# Patient Record
Sex: Female | Born: 1970 | Race: Black or African American | Hispanic: No | Marital: Married | Smoking: Never smoker
Health system: Southern US, Community
[De-identification: ages and names within clinical notes are randomized; demographics above are authoritative.]

## PROBLEM LIST (undated history)

## (undated) ENCOUNTER — Inpatient Hospital Stay (HOSPITAL_COMMUNITY): Payer: Self-pay

## (undated) DIAGNOSIS — L259 Unspecified contact dermatitis, unspecified cause: Secondary | ICD-10-CM

## (undated) DIAGNOSIS — N949 Unspecified condition associated with female genital organs and menstrual cycle: Secondary | ICD-10-CM

## (undated) DIAGNOSIS — O211 Hyperemesis gravidarum with metabolic disturbance: Secondary | ICD-10-CM

## (undated) DIAGNOSIS — E119 Type 2 diabetes mellitus without complications: Secondary | ICD-10-CM

## (undated) DIAGNOSIS — D509 Iron deficiency anemia, unspecified: Secondary | ICD-10-CM

## (undated) DIAGNOSIS — G43909 Migraine, unspecified, not intractable, without status migrainosus: Secondary | ICD-10-CM

## (undated) DIAGNOSIS — Z8742 Personal history of other diseases of the female genital tract: Secondary | ICD-10-CM

## (undated) DIAGNOSIS — O24419 Gestational diabetes mellitus in pregnancy, unspecified control: Secondary | ICD-10-CM

## (undated) DIAGNOSIS — N90810 Female genital mutilation status, unspecified: Secondary | ICD-10-CM

## (undated) DIAGNOSIS — R197 Diarrhea, unspecified: Secondary | ICD-10-CM

## (undated) DIAGNOSIS — K219 Gastro-esophageal reflux disease without esophagitis: Secondary | ICD-10-CM

## (undated) DIAGNOSIS — T839XXA Unspecified complication of genitourinary prosthetic device, implant and graft, initial encounter: Secondary | ICD-10-CM

## (undated) DIAGNOSIS — O09529 Supervision of elderly multigravida, unspecified trimester: Secondary | ICD-10-CM

## (undated) HISTORY — DX: Migraine, unspecified, not intractable, without status migrainosus: G43.909

## (undated) HISTORY — DX: Female genital mutilation status, unspecified: N90.810

## (undated) HISTORY — DX: Unspecified complication of genitourinary prosthetic device, implant and graft, initial encounter: T83.9XXA

## (undated) HISTORY — DX: Supervision of elderly multigravida, unspecified trimester: O09.529

## (undated) HISTORY — DX: Gestational diabetes mellitus in pregnancy, unspecified control: O24.419

## (undated) HISTORY — DX: Hyperemesis gravidarum with metabolic disturbance: O21.1

## (undated) HISTORY — DX: Unspecified condition associated with female genital organs and menstrual cycle: N94.9

## (undated) HISTORY — DX: Personal history of other diseases of the female genital tract: Z87.42

## (undated) HISTORY — DX: Unspecified contact dermatitis, unspecified cause: L25.9

## (undated) HISTORY — DX: Iron deficiency anemia, unspecified: D50.9

## (undated) HISTORY — DX: Gastro-esophageal reflux disease without esophagitis: K21.9

## (undated) HISTORY — DX: Type 2 diabetes mellitus without complications: E11.9

---
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
## 1999-04-19 ENCOUNTER — Other Ambulatory Visit: Admission: RE | Admit: 1999-04-19 | Discharge: 1999-04-19 | Payer: Self-pay | Admitting: Obstetrics

## 1999-11-21 ENCOUNTER — Inpatient Hospital Stay (HOSPITAL_COMMUNITY): Admission: AD | Admit: 1999-11-21 | Discharge: 1999-11-21 | Payer: Self-pay | Admitting: Internal Medicine

## 1999-11-22 ENCOUNTER — Inpatient Hospital Stay (HOSPITAL_COMMUNITY): Admission: AD | Admit: 1999-11-22 | Discharge: 1999-11-23 | Payer: Self-pay | Admitting: Obstetrics & Gynecology

## 1999-12-31 ENCOUNTER — Encounter: Admission: RE | Admit: 1999-12-31 | Discharge: 1999-12-31 | Payer: Self-pay | Admitting: Family Medicine

## 2000-01-24 ENCOUNTER — Encounter: Admission: RE | Admit: 2000-01-24 | Discharge: 2000-01-24 | Payer: Self-pay | Admitting: Family Medicine

## 2000-02-27 ENCOUNTER — Encounter: Admission: RE | Admit: 2000-02-27 | Discharge: 2000-02-27 | Payer: Self-pay | Admitting: Family Medicine

## 2001-03-03 ENCOUNTER — Encounter: Admission: RE | Admit: 2001-03-03 | Discharge: 2001-03-03 | Payer: Self-pay | Admitting: Family Medicine

## 2001-03-12 ENCOUNTER — Encounter: Admission: RE | Admit: 2001-03-12 | Discharge: 2001-03-12 | Payer: Self-pay | Admitting: Family Medicine

## 2001-03-19 ENCOUNTER — Encounter: Payer: Self-pay | Admitting: Family Medicine

## 2001-03-19 ENCOUNTER — Encounter: Admission: RE | Admit: 2001-03-19 | Discharge: 2001-03-19 | Payer: Self-pay | Admitting: Family Medicine

## 2001-04-13 ENCOUNTER — Encounter: Admission: RE | Admit: 2001-04-13 | Discharge: 2001-04-13 | Payer: Self-pay | Admitting: Family Medicine

## 2001-04-20 ENCOUNTER — Encounter: Admission: RE | Admit: 2001-04-20 | Discharge: 2001-04-20 | Payer: Self-pay | Admitting: Family Medicine

## 2001-10-18 ENCOUNTER — Emergency Department (HOSPITAL_COMMUNITY): Admission: EM | Admit: 2001-10-18 | Discharge: 2001-10-18 | Payer: Self-pay

## 2001-11-18 ENCOUNTER — Encounter: Admission: RE | Admit: 2001-11-18 | Discharge: 2001-11-18 | Payer: Self-pay | Admitting: Family Medicine

## 2001-12-21 ENCOUNTER — Encounter: Admission: RE | Admit: 2001-12-21 | Discharge: 2001-12-21 | Payer: Self-pay | Admitting: Sports Medicine

## 2001-12-24 ENCOUNTER — Encounter: Admission: RE | Admit: 2001-12-24 | Discharge: 2001-12-24 | Payer: Self-pay | Admitting: Family Medicine

## 2002-07-28 ENCOUNTER — Encounter: Admission: RE | Admit: 2002-07-28 | Discharge: 2002-07-28 | Payer: Self-pay | Admitting: Family Medicine

## 2002-10-08 ENCOUNTER — Encounter: Admission: RE | Admit: 2002-10-08 | Discharge: 2002-10-08 | Payer: Self-pay | Admitting: Family Medicine

## 2002-10-28 ENCOUNTER — Encounter: Admission: RE | Admit: 2002-10-28 | Discharge: 2002-10-28 | Payer: Self-pay | Admitting: Family Medicine

## 2002-12-18 ENCOUNTER — Emergency Department (HOSPITAL_COMMUNITY): Admission: EM | Admit: 2002-12-18 | Discharge: 2002-12-18 | Payer: Self-pay | Admitting: Emergency Medicine

## 2002-12-20 ENCOUNTER — Encounter: Admission: RE | Admit: 2002-12-20 | Discharge: 2002-12-20 | Payer: Self-pay | Admitting: Family Medicine

## 2002-12-27 ENCOUNTER — Encounter: Admission: RE | Admit: 2002-12-27 | Discharge: 2002-12-27 | Payer: Self-pay | Admitting: Family Medicine

## 2002-12-28 ENCOUNTER — Encounter: Payer: Self-pay | Admitting: Family Medicine

## 2002-12-28 ENCOUNTER — Encounter: Admission: RE | Admit: 2002-12-28 | Discharge: 2002-12-28 | Payer: Self-pay | Admitting: Family Medicine

## 2003-09-01 ENCOUNTER — Encounter: Admission: RE | Admit: 2003-09-01 | Discharge: 2003-09-01 | Payer: Self-pay | Admitting: Family Medicine

## 2003-09-15 ENCOUNTER — Encounter: Admission: RE | Admit: 2003-09-15 | Discharge: 2003-09-15 | Payer: Self-pay | Admitting: Sports Medicine

## 2003-11-04 ENCOUNTER — Encounter: Admission: RE | Admit: 2003-11-04 | Discharge: 2003-11-04 | Payer: Self-pay | Admitting: Family Medicine

## 2003-11-24 ENCOUNTER — Encounter: Admission: RE | Admit: 2003-11-24 | Discharge: 2003-11-24 | Payer: Self-pay | Admitting: Family Medicine

## 2003-12-21 ENCOUNTER — Encounter: Admission: RE | Admit: 2003-12-21 | Discharge: 2003-12-21 | Payer: Self-pay | Admitting: Family Medicine

## 2004-04-03 ENCOUNTER — Encounter: Admission: RE | Admit: 2004-04-03 | Discharge: 2004-04-03 | Payer: Self-pay | Admitting: Family Medicine

## 2004-04-04 ENCOUNTER — Ambulatory Visit (HOSPITAL_COMMUNITY): Admission: RE | Admit: 2004-04-04 | Discharge: 2004-04-04 | Payer: Self-pay | Admitting: Family Medicine

## 2004-04-09 ENCOUNTER — Encounter: Admission: RE | Admit: 2004-04-09 | Discharge: 2004-04-09 | Payer: Self-pay | Admitting: Family Medicine

## 2004-04-16 ENCOUNTER — Encounter: Admission: RE | Admit: 2004-04-16 | Discharge: 2004-04-16 | Payer: Self-pay | Admitting: Family Medicine

## 2004-05-03 ENCOUNTER — Encounter: Admission: RE | Admit: 2004-05-03 | Discharge: 2004-05-03 | Payer: Self-pay | Admitting: Sports Medicine

## 2004-05-17 ENCOUNTER — Encounter: Admission: RE | Admit: 2004-05-17 | Discharge: 2004-05-17 | Payer: Self-pay | Admitting: Family Medicine

## 2004-05-24 ENCOUNTER — Encounter: Admission: RE | Admit: 2004-05-24 | Discharge: 2004-05-24 | Payer: Self-pay | Admitting: Family Medicine

## 2004-05-24 ENCOUNTER — Ambulatory Visit (HOSPITAL_COMMUNITY): Admission: RE | Admit: 2004-05-24 | Discharge: 2004-05-24 | Payer: Self-pay | Admitting: *Deleted

## 2004-05-25 ENCOUNTER — Encounter: Admission: RE | Admit: 2004-05-25 | Discharge: 2004-05-25 | Payer: Self-pay | Admitting: Family Medicine

## 2004-05-28 ENCOUNTER — Encounter: Admission: RE | Admit: 2004-05-28 | Discharge: 2004-05-28 | Payer: Self-pay | Admitting: *Deleted

## 2004-05-31 ENCOUNTER — Encounter: Admission: RE | Admit: 2004-05-31 | Discharge: 2004-05-31 | Payer: Self-pay | Admitting: *Deleted

## 2004-06-04 ENCOUNTER — Encounter: Admission: RE | Admit: 2004-06-04 | Discharge: 2004-06-04 | Payer: Self-pay | Admitting: *Deleted

## 2004-06-07 ENCOUNTER — Encounter: Admission: RE | Admit: 2004-06-07 | Discharge: 2004-06-07 | Payer: Self-pay | Admitting: *Deleted

## 2004-06-11 ENCOUNTER — Encounter: Admission: RE | Admit: 2004-06-11 | Discharge: 2004-06-11 | Payer: Self-pay | Admitting: *Deleted

## 2004-06-14 ENCOUNTER — Ambulatory Visit (HOSPITAL_COMMUNITY): Admission: RE | Admit: 2004-06-14 | Discharge: 2004-06-14 | Payer: Self-pay | Admitting: Family Medicine

## 2004-06-14 ENCOUNTER — Encounter: Admission: RE | Admit: 2004-06-14 | Discharge: 2004-06-14 | Payer: Self-pay | Admitting: Family Medicine

## 2004-06-19 ENCOUNTER — Encounter: Admission: RE | Admit: 2004-06-19 | Discharge: 2004-06-19 | Payer: Self-pay | Admitting: *Deleted

## 2004-06-21 ENCOUNTER — Inpatient Hospital Stay (HOSPITAL_COMMUNITY): Admission: RE | Admit: 2004-06-21 | Discharge: 2004-06-24 | Payer: Self-pay | Admitting: Obstetrics & Gynecology

## 2004-08-01 ENCOUNTER — Encounter: Admission: RE | Admit: 2004-08-01 | Discharge: 2004-08-01 | Payer: Self-pay | Admitting: Sports Medicine

## 2004-08-16 ENCOUNTER — Encounter (INDEPENDENT_AMBULATORY_CARE_PROVIDER_SITE_OTHER): Payer: Self-pay | Admitting: *Deleted

## 2004-09-13 ENCOUNTER — Other Ambulatory Visit: Admission: RE | Admit: 2004-09-13 | Discharge: 2004-09-13 | Payer: Self-pay | Admitting: Family Medicine

## 2004-09-13 ENCOUNTER — Ambulatory Visit: Payer: Self-pay | Admitting: Family Medicine

## 2005-11-21 ENCOUNTER — Ambulatory Visit: Payer: Self-pay | Admitting: Family Medicine

## 2006-03-18 ENCOUNTER — Ambulatory Visit: Payer: Self-pay | Admitting: Family Medicine

## 2006-03-21 ENCOUNTER — Encounter: Admission: RE | Admit: 2006-03-21 | Discharge: 2006-04-10 | Payer: Self-pay | Admitting: Sports Medicine

## 2006-05-09 ENCOUNTER — Ambulatory Visit: Payer: Self-pay | Admitting: Family Medicine

## 2006-05-14 ENCOUNTER — Ambulatory Visit: Payer: Self-pay | Admitting: Family Medicine

## 2006-06-09 ENCOUNTER — Ambulatory Visit: Payer: Self-pay | Admitting: Sports Medicine

## 2007-02-12 DIAGNOSIS — L708 Other acne: Secondary | ICD-10-CM | POA: Insufficient documentation

## 2007-02-12 DIAGNOSIS — K219 Gastro-esophageal reflux disease without esophagitis: Secondary | ICD-10-CM | POA: Insufficient documentation

## 2007-02-12 DIAGNOSIS — K649 Unspecified hemorrhoids: Secondary | ICD-10-CM | POA: Insufficient documentation

## 2007-02-12 DIAGNOSIS — D509 Iron deficiency anemia, unspecified: Secondary | ICD-10-CM | POA: Insufficient documentation

## 2007-02-12 HISTORY — DX: Iron deficiency anemia, unspecified: D50.9

## 2007-02-13 ENCOUNTER — Encounter (INDEPENDENT_AMBULATORY_CARE_PROVIDER_SITE_OTHER): Payer: Self-pay | Admitting: *Deleted

## 2007-04-29 ENCOUNTER — Ambulatory Visit: Payer: Self-pay | Admitting: Family Medicine

## 2007-05-01 ENCOUNTER — Ambulatory Visit: Payer: Self-pay | Admitting: Family Medicine

## 2007-06-15 ENCOUNTER — Telehealth: Payer: Self-pay | Admitting: *Deleted

## 2007-06-16 ENCOUNTER — Ambulatory Visit: Payer: Self-pay | Admitting: Family Medicine

## 2007-06-16 DIAGNOSIS — H1045 Other chronic allergic conjunctivitis: Secondary | ICD-10-CM | POA: Insufficient documentation

## 2007-11-11 ENCOUNTER — Encounter: Payer: Self-pay | Admitting: Family Medicine

## 2007-11-11 ENCOUNTER — Ambulatory Visit: Payer: Self-pay | Admitting: Family Medicine

## 2007-11-11 DIAGNOSIS — R5381 Other malaise: Secondary | ICD-10-CM | POA: Insufficient documentation

## 2007-11-11 DIAGNOSIS — R5383 Other fatigue: Secondary | ICD-10-CM

## 2007-11-11 DIAGNOSIS — J029 Acute pharyngitis, unspecified: Secondary | ICD-10-CM | POA: Insufficient documentation

## 2007-11-11 LAB — CONVERTED CEMR LAB
AST: 11 units/L (ref 0–37)
Albumin: 4.4 g/dL (ref 3.5–5.2)
Alkaline Phosphatase: 56 units/L (ref 39–117)
BUN: 8 mg/dL (ref 6–23)
CO2: 24 meq/L (ref 19–32)
Calcium: 9.4 mg/dL (ref 8.4–10.5)
Chloride: 106 meq/L (ref 96–112)
Creatinine, Ser: 0.44 mg/dL (ref 0.40–1.20)
MCHC: 29.2 g/dL — ABNORMAL LOW (ref 30.0–36.0)
MCV: 70.4 fL — ABNORMAL LOW (ref 78.0–100.0)
Neutro Abs: 3.3 10*3/uL (ref 1.7–7.7)
Neutrophils Relative %: 60 % (ref 43–77)
Platelets: 256 10*3/uL (ref 150–400)
Rapid Strep: NEGATIVE
Sodium: 140 meq/L (ref 135–145)
TSH: 2.44 microintl units/mL (ref 0.350–5.50)
Total Bilirubin: 0.3 mg/dL (ref 0.3–1.2)
Total Protein: 7.4 g/dL (ref 6.0–8.3)
WBC: 5.6 10*3/uL (ref 4.0–10.5)

## 2007-12-17 HISTORY — PX: DILATION AND CURETTAGE OF UTERUS: SHX78

## 2008-02-29 ENCOUNTER — Telehealth: Payer: Self-pay | Admitting: *Deleted

## 2008-03-01 ENCOUNTER — Ambulatory Visit: Payer: Self-pay | Admitting: Family Medicine

## 2008-03-01 ENCOUNTER — Encounter: Payer: Self-pay | Admitting: Family Medicine

## 2008-03-02 LAB — CONVERTED CEMR LAB
Basophils Absolute: 0 10*3/uL (ref 0.0–0.1)
Eosinophils Relative: 1 % (ref 0–5)
Lymphs Abs: 1.9 10*3/uL (ref 0.7–4.0)
Monocytes Absolute: 0.4 10*3/uL (ref 0.1–1.0)
Neutrophils Relative %: 52 % (ref 43–77)
RBC: 5.19 M/uL — ABNORMAL HIGH (ref 3.87–5.11)
WBC: 4.9 10*3/uL (ref 4.0–10.5)

## 2008-04-06 ENCOUNTER — Ambulatory Visit: Payer: Self-pay | Admitting: Family Medicine

## 2008-04-06 DIAGNOSIS — L259 Unspecified contact dermatitis, unspecified cause: Secondary | ICD-10-CM | POA: Insufficient documentation

## 2008-04-06 LAB — CONVERTED CEMR LAB: Beta hcg, urine, semiquantitative: POSITIVE

## 2008-04-25 ENCOUNTER — Ambulatory Visit: Payer: Self-pay | Admitting: Family Medicine

## 2008-04-25 ENCOUNTER — Encounter: Payer: Self-pay | Admitting: Family Medicine

## 2008-04-25 LAB — CONVERTED CEMR LAB
Antibody Screen: NEGATIVE
Basophils Absolute: 0 10*3/uL (ref 0.0–0.1)
Eosinophils Absolute: 0.1 10*3/uL (ref 0.0–0.7)
Eosinophils Relative: 2 % (ref 0–5)
HCT: 36.8 % (ref 36.0–46.0)
Hemoglobin: 11.3 g/dL — ABNORMAL LOW (ref 12.0–15.0)
Hepatitis B Surface Ag: NEGATIVE
Lymphs Abs: 1.6 10*3/uL (ref 0.7–4.0)
MCHC: 30.7 g/dL (ref 30.0–36.0)
MCV: 72.3 fL — ABNORMAL LOW (ref 78.0–100.0)
Rh Type: POSITIVE
Rubella: 139.4 intl units/mL — ABNORMAL HIGH
Sickle Cell Screen: NEGATIVE
WBC: 5.1 10*3/uL (ref 4.0–10.5)

## 2008-04-26 ENCOUNTER — Telehealth: Payer: Self-pay | Admitting: Family Medicine

## 2008-04-29 ENCOUNTER — Ambulatory Visit: Payer: Self-pay | Admitting: Family Medicine

## 2008-04-29 DIAGNOSIS — O211 Hyperemesis gravidarum with metabolic disturbance: Secondary | ICD-10-CM | POA: Insufficient documentation

## 2008-04-29 HISTORY — DX: Hyperemesis gravidarum with metabolic disturbance: O21.1

## 2008-05-02 ENCOUNTER — Encounter: Payer: Self-pay | Admitting: Family Medicine

## 2008-05-02 ENCOUNTER — Ambulatory Visit: Payer: Self-pay | Admitting: Family Medicine

## 2008-05-18 ENCOUNTER — Encounter: Payer: Self-pay | Admitting: Family Medicine

## 2008-05-18 ENCOUNTER — Ambulatory Visit: Payer: Self-pay | Admitting: Family Medicine

## 2008-05-18 DIAGNOSIS — O09529 Supervision of elderly multigravida, unspecified trimester: Secondary | ICD-10-CM | POA: Insufficient documentation

## 2008-05-18 DIAGNOSIS — Z8742 Personal history of other diseases of the female genital tract: Secondary | ICD-10-CM | POA: Insufficient documentation

## 2008-05-18 HISTORY — DX: Supervision of elderly multigravida, unspecified trimester: O09.529

## 2008-05-18 HISTORY — DX: Personal history of other diseases of the female genital tract: Z87.42

## 2008-05-18 LAB — CONVERTED CEMR LAB: GC Probe Amp, Genital: NEGATIVE

## 2008-05-19 ENCOUNTER — Ambulatory Visit (HOSPITAL_COMMUNITY): Admission: RE | Admit: 2008-05-19 | Discharge: 2008-05-19 | Payer: Self-pay | Admitting: Family Medicine

## 2008-05-20 ENCOUNTER — Telehealth: Payer: Self-pay | Admitting: *Deleted

## 2008-05-23 ENCOUNTER — Encounter: Payer: Self-pay | Admitting: Family Medicine

## 2008-05-23 ENCOUNTER — Ambulatory Visit: Payer: Self-pay | Admitting: Family Medicine

## 2008-05-23 ENCOUNTER — Ambulatory Visit (HOSPITAL_COMMUNITY): Admission: RE | Admit: 2008-05-23 | Discharge: 2008-05-23 | Payer: Self-pay | Admitting: Obstetrics and Gynecology

## 2008-05-25 ENCOUNTER — Encounter (INDEPENDENT_AMBULATORY_CARE_PROVIDER_SITE_OTHER): Payer: Self-pay | Admitting: Family Medicine

## 2008-06-02 ENCOUNTER — Telehealth (INDEPENDENT_AMBULATORY_CARE_PROVIDER_SITE_OTHER): Payer: Self-pay | Admitting: *Deleted

## 2008-06-24 ENCOUNTER — Emergency Department (HOSPITAL_COMMUNITY): Admission: EM | Admit: 2008-06-24 | Discharge: 2008-06-24 | Payer: Self-pay | Admitting: Emergency Medicine

## 2008-07-07 ENCOUNTER — Telehealth: Payer: Self-pay | Admitting: Family Medicine

## 2008-07-08 ENCOUNTER — Encounter: Payer: Self-pay | Admitting: *Deleted

## 2008-07-20 ENCOUNTER — Encounter: Payer: Self-pay | Admitting: *Deleted

## 2008-12-30 ENCOUNTER — Ambulatory Visit: Payer: Self-pay | Admitting: Family Medicine

## 2008-12-30 DIAGNOSIS — J1089 Influenza due to other identified influenza virus with other manifestations: Secondary | ICD-10-CM | POA: Insufficient documentation

## 2009-03-03 ENCOUNTER — Inpatient Hospital Stay (HOSPITAL_COMMUNITY): Admission: AD | Admit: 2009-03-03 | Discharge: 2009-03-03 | Payer: Self-pay | Admitting: Obstetrics and Gynecology

## 2009-04-03 ENCOUNTER — Inpatient Hospital Stay (HOSPITAL_COMMUNITY): Admission: AD | Admit: 2009-04-03 | Discharge: 2009-04-04 | Payer: Self-pay | Admitting: Obstetrics & Gynecology

## 2009-05-24 ENCOUNTER — Inpatient Hospital Stay (HOSPITAL_COMMUNITY): Admission: RE | Admit: 2009-05-24 | Discharge: 2009-05-27 | Payer: Self-pay | Admitting: Obstetrics and Gynecology

## 2009-08-27 ENCOUNTER — Emergency Department (HOSPITAL_COMMUNITY): Admission: EM | Admit: 2009-08-27 | Discharge: 2009-08-27 | Payer: Self-pay | Admitting: Family Medicine

## 2009-11-03 ENCOUNTER — Ambulatory Visit: Payer: Self-pay | Admitting: Family Medicine

## 2009-11-03 DIAGNOSIS — N898 Other specified noninflammatory disorders of vagina: Secondary | ICD-10-CM | POA: Insufficient documentation

## 2009-11-03 LAB — CONVERTED CEMR LAB: Whiff Test: NEGATIVE

## 2009-11-21 ENCOUNTER — Ambulatory Visit: Payer: Self-pay | Admitting: Family Medicine

## 2009-12-30 ENCOUNTER — Emergency Department (HOSPITAL_COMMUNITY): Admission: EM | Admit: 2009-12-30 | Discharge: 2009-12-30 | Payer: Self-pay | Admitting: Family Medicine

## 2010-03-10 ENCOUNTER — Emergency Department (HOSPITAL_COMMUNITY): Admission: EM | Admit: 2010-03-10 | Discharge: 2010-03-10 | Payer: Self-pay | Admitting: Family Medicine

## 2010-06-11 ENCOUNTER — Encounter: Payer: Self-pay | Admitting: Family Medicine

## 2010-06-11 ENCOUNTER — Ambulatory Visit: Payer: Self-pay | Admitting: Family Medicine

## 2010-06-11 DIAGNOSIS — N949 Unspecified condition associated with female genital organs and menstrual cycle: Secondary | ICD-10-CM

## 2010-06-11 DIAGNOSIS — N938 Other specified abnormal uterine and vaginal bleeding: Secondary | ICD-10-CM | POA: Insufficient documentation

## 2010-06-11 HISTORY — DX: Other specified abnormal uterine and vaginal bleeding: N93.8

## 2010-06-11 LAB — CONVERTED CEMR LAB
BUN: 8 mg/dL (ref 6–23)
Calcium: 8.9 mg/dL (ref 8.4–10.5)
Chloride: 106 meq/L (ref 96–112)
Creatinine, Ser: 0.49 mg/dL (ref 0.40–1.20)
MCHC: 31 g/dL (ref 30.0–36.0)
MCV: 75.9 fL — ABNORMAL LOW (ref 78.0–100.0)
RDW: 16.1 % — ABNORMAL HIGH (ref 11.5–15.5)
TSH: 2.393 microintl units/mL (ref 0.350–4.500)
Total Protein: 7 g/dL (ref 6.0–8.3)

## 2010-06-12 ENCOUNTER — Ambulatory Visit (HOSPITAL_COMMUNITY): Admission: RE | Admit: 2010-06-12 | Discharge: 2010-06-12 | Payer: Self-pay | Admitting: Family Medicine

## 2010-06-15 ENCOUNTER — Ambulatory Visit: Payer: Self-pay | Admitting: Family Medicine

## 2011-01-09 ENCOUNTER — Ambulatory Visit
Admission: RE | Admit: 2011-01-09 | Discharge: 2011-01-09 | Payer: Self-pay | Source: Home / Self Care | Attending: Family Medicine | Admitting: Family Medicine

## 2011-01-09 DIAGNOSIS — G43909 Migraine, unspecified, not intractable, without status migrainosus: Secondary | ICD-10-CM | POA: Insufficient documentation

## 2011-01-11 ENCOUNTER — Ambulatory Visit: Admission: RE | Admit: 2011-01-11 | Discharge: 2011-01-11 | Payer: Self-pay | Source: Home / Self Care

## 2011-01-15 NOTE — Assessment & Plan Note (Signed)
Summary: ?'s about iud, bleeding   Vital Signs:  Patient profile:   40 year old female Height:      57 inches Weight:      169.3 pounds BMI:     36.77 Temp:     98.2 degrees F oral Pulse rate:   96 / minute BP sitting:   113 / 77  (left arm) Cuff size:   regular  Vitals Entered By: Gladstone Pih (June 11, 2010 8:48 AM) CC: Discuss IUD and bleeding Is Patient Diabetic? No Pain Assessment Patient in pain? no        Primary Care Provider:  Bobby Rumpf  MD  CC:  Discuss IUD and bleeding.  History of Present Illness: irregular bleeding-has had mirena for 1 year, since birth of child.  previously had 7 day heavy period regualr 28 days.  Did well with copper IUD in terms of regular periods but was anemic and was anemic during pregnancy.  Switched to mirena for bleeding concerns.  now has irregular bleeding, mostly light but occ heavy.  2-3 x / month.  lasts between 2-14 days.  Biggest problem is that she feels "dirty" all the time and would like the bleeding to be regular.  fatigue-since birth of last child, now 1y/o.  also with 6 and 98 y/o.  husband is overseas until august.  She stays home with baby and lays down every 4 hours for a 10 min nap.  She wakes up 2x/night to care for baby and also reports that she is a light sleeper, waking if the baby moves.  She is bottle feeding, no multivitamin, no iron.    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  Rest of family in [place].; No tob, EtOH, drugs  Risk Factors: Smoking Status: never (06/11/2010)  Review of Systems  The patient denies anorexia, fever, weight loss, and abdominal pain.         pt complains of fatigue and occasional nausea.    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  Will check labs below to eval for liver, thyroid, renal problems leading to bleeding.  THere has been speculation of thalessemia in the past.  Will get a CBC today.  if anemic, could consider an electrophoresis.  U/S to eval for thickened endometrium and poss fibroids.  Will see back after u/s to make further plan. Comp Met-[facility] [phone]) CBC-[facility] ([address]) TSH-[facility] [phone]) Ultrasound (Ultrasound) [facility]- Est  Level 4 (78469)  Problem # 2:  FATIGUE (ICD-780.79) Assessment: Deteriorated Fatigue has been a prob in the past, but is worse now with new child.  likely normal fatigue but will check CBC today and TSH.  Will  get a PHQ 9 next visit to eval for depression but no sign of that today.  seems to be coping well. Orders: [facility]- Est  Level 4 ([address]782)  Patient Instructions: 1)  It was nice to meet you today. 2)  Please call for an appointment after you have had your ultrasound.   I will look into the next step for you so that we can discuss it at your next appointment.   3)  We will discuss your blood work and your ultrasound at your next appointment. 4)  It is probably normal for you to feel tired.  Try to take a little time for you as you can with your busy life.  If you  start to feel overwhelmed, make sure you call someone to come and help.   Prevention & Chronic Care Immunizations   Influenza vaccine: Fluvax Non-MCR  (11/21/2009)    Tetanus booster: 04/29/2007: Tdap   Tetanus booster due: 04/28/2017    Pneumococcal vaccine: Not documented  Other Screening   Pap smear: normal  (05/25/2008)   Pap smear due: 06/12/2011    Mammogram: Not documented   Mammogram action/deferral: Deferred  (06/11/2010)   Smoking status: never  (06/11/2010)  Lipids   Total Cholesterol: Not documented   LDL: Not documented   LDL Direct: Not documented   HDL: Not documented   Triglycerides: Not documented

## 2011-01-15 NOTE — Assessment & Plan Note (Signed)
Summary: f/up,tcb   Vital Signs:  Patient profile:   40 year old female Height:      57 inches Weight:      169.3 pounds BMI:     36.77 Temp:     97.7 degrees F oral Pulse rate:   88 / minute BP sitting:   112 / 69  (left arm) Cuff size:   regular  Vitals Entered By: Garen Grams LPN (June 15, 4097 8:44 AM) CC: f/u US, labs Is Patient Diabetic? No Pain Assessment Patient in pain? no        Primary Care Provider:  Bobby Rumpf  MD  CC:  f/u US and labs.  History of Present Illness: Seen on 6/27 for irregualr bleeding, fatigue - here for follow up.   1) Irregular bleeding: Complaints of irregular bleeding since Mirena insertion (not performed here) on August 19th 2010 (after birth of child). Previously had 7 day heavy period,  28 days apart.  Did well with copper IUD in terms of regularity periods but periods were quite heavy with this device and she became anemic thus switched to mirena for bleeding concerns.  Now periods are 26-30 days apart - her last period involved light bleeding/spotting from June 11th to June 26th. Last between 2-14 days. Lighter than before.  Biggest problem is that she feels "dirty" all the time and would like the bleeding to be regular. CBC showed no anemia but MCV of 75, normal TSH, normal CMET. U/S showed no definite abnormalities, Mirena in correct location.   2) Fatigue: Since birth of last child, now 1y/o.  also with 6 and 28 y/o.  Husband is overseas until august.  She stays home with baby and lays down every 4 hours for a 10 min nap.  She wakes up 2x/night to care for baby and also reports that she is a light sleeper, waking if the baby moves.  She is bottle feeding, no multivitamin, no iron.    ROS: Denies abdominal pain, severe cramping, presyncope, mood changes, weight changes, appetite changes, anhedonia, nausea, weakness.   See [facility] for med rec.   Habits & Providers  Alcohol-Tobacco-Diet     Tobacco Status: never  Medications Prior  to Update: 1)  None  Allergies (verified): No Known Drug Allergies  Review of Systems       as per HPI o/w negative   Physical Exam  General:  pleasant, overweight, NAD  Eyes:  no conjunctival pallor.  Lungs:  Normal respiratory effort, chest expands symmetrically. Lungs are clear to auscultation, no crackles or wheezes. Heart:  Normal rate and regular rhythm. S1 and S2 normal without gallop, murmur, click, rub or other extra sounds. Abdomen:  soft, non-tender, normal bowel sounds, and no masses.     Impression & Recommendations:  Problem # 1:  DYSFUNCTIONAL UTERINE BLEEDING (ICD-626.8) Assessment Unchanged  Likely secondary to Mirena. U/S w/o definite pathology. Menstrual bleeding is lighter than before - patient does not mind this - the issue is with the irregularity of her periods. Advised to keep menstrual calendar. Reviewed labs with patient. Will follow if this continues to be an issue.   Orders: [facility]- Est  Level 4 (11914)  Problem # 2:  FATIGUE (ICD-780.79) Assessment: Unchanged  Mild fatigue. TSH wnl. CMET wnl. CBC w/ low MCV but no anemia. Negative SIGECAPS screen for depression. Fatigue appears to be secondary to stresses of caring for family; however Mrs. Suchy does not appear to be overwhelmed with this. I  advised that if she ever started feeling overwhelmed or developed depressive symptoms she should come back in to discuss this further.   Orders: [facility]- Est  Level 4 ([address]109)  Complete Medication List: 1)  Prenavite Multiple Vitamin 28-0.8 Mg Tabs (Prenatal vit-fe fumarate-fa) .... One tab by mouth qday Prescriptions: PRENAVITE MULTIPLE VITAMIN 28-0.8 MG TABS (PRENATAL VIT-FE FUMARATE-FA) one tab by mouth qday  #30 x 12   Entered and Authorized by:   Bobby Rumpf  MD   Signed by:   Bobby Rumpf  MD on 06/15/2010   Method used:   Electronically to        [facility]       [address]       [place], [place]   [address]       Ph: [phone]       Fax: [phone]   RxID:   [id number]

## 2011-01-17 NOTE — Assessment & Plan Note (Signed)
Summary: headache/ls   Vital Signs:  Patient profile:   40 year old female Height:      57 inches Weight:      179.9 pounds BMI:     39.07 Temp:     97.6 degrees F oral Pulse rate:   77 / minute BP sitting:   128 / 88  (left arm) Cuff size:   regular  Vitals Entered By: Garen Grams LPN (January 09, 2011 9:13 AM) CC: migraine, Headache Is Patient Diabetic? No Pain Assessment Patient in pain? yes        Primary Care Provider:  Bobby Rumpf  MD  CC:  migraine and Headache.  History of Present Illness: 40 yo female with a self reported hx of migraines, happen 1-2 times a year that occur has been seen by urgen t care in the past.  Today started this morning no aura came on mostly in the back of the head first and spread, throbbing mostly on right side + photophobia, + nausea, no fever, chills, diarrhea or constipation no sick contacts, some neck soreness but can move, no vision problems and no numbness or tingling in fingers or toes.   Headache HPI:      The patient comes in for an acute visit for headaches.  The current headache started approximately 01/09/2011.  She notes one previous headache similar to the current one.  The headaches generally will last anywhere from 15 minutes to 2 hours at a time.  She has approximately 1 headaches per month.  Headaches have been occurring since age 58.  The patient is right handed.         Headache Treatment History:      NSAID's were tried but not effective.    Habits & Providers  Alcohol-Tobacco-Diet     Tobacco Status: never  Current Medications (verified): 1)  Prenavite Multiple Vitamin 28-0.8 Mg Tabs (Prenatal Vit-Fe Fumarate-Fa) .... One Tab By Mouth Qday 2)  Sumatriptan Succinate 50 Mg Tabs (Sumatriptan Succinate) .Marland Kitchen.. 1 Tab At The Onset of Headache and Can Repeat in One Hour If Symptoms Presist.  Do Not Take More Than 2 Pills in A 24 Hour Period 3)  Ibuprofen 600 Mg Tabs (Ibuprofen) .Marland Kitchen.. 1 Tab By Mouth Three Times A Day As Needed  For Heache 4)  Promethazine Hcl 12.5 Mg Tabs (Promethazine Hcl) .Marland Kitchen.. 1 Tab By Mouth Three Times A Day As Needed For Nausea  Allergies (verified): No Known Drug Allergies  Past History:  Past medical, surgical, family and social histories (including risk factors) reviewed, and no changes noted (except as noted below).  Past Medical History: Reviewed history from 02/12/2007 and no changes required. 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 hx tonsillectomy -, IUD placement - copper lot # [id number] - 07/16/2004  Family History: Reviewed history from 11/11/2007 and no changes required. GM-DM, GF-htn Breast cancer- two cousins and possibly an aunt on maternal side  Social History: Reviewed history from 11/11/2007 and no changes required. Lives with husband and dtrs in [place].  Rest of family in [place].; No tob, EtOH, drugs  Review of Systems       see hpi  Physical Exam  General:  in distress, sitting in the dark Head:  atraumatic Eyes:  PERRLA, EOMI, no papilledema seen  Ears:  Tm intact bilaterally Mouth:  MMM, uvula midline Neck:  Full ROM some scalene tightness. no menigeal signs Lungs:  Normal respiratory effort, chest expands symmetrically. Lungs are clear to auscultation, no crackles or wheezes. Heart:  Normal rate and regular rhythm. S1 and S2 normal without gallop, murmur, click, rub or other extra sounds. Abdomen:  soft, non-tender, normal bowel sounds, and no masses.   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  Pt has what appears to be classic migraines without aura. Gave pt shot of imitrex after recheck of blood pressure showed  pressure of 128/88 from intially SBP of 160.  Pt also given a shot of phenergen.  Will treat with ibuprofen three times a day for now and imitrex to have on hand and told how to use it. Also rx for phenergen.  Pt has appointment friday and will keep and will see Dr. Wallene Huh at that time.  Discussed care with Dr. Jennette Kettle and [place],  Her updated medication list for this problem includes:    Sumatriptan Succinate 50 Mg Tabs (Sumatriptan succinate) .Marland Kitchen... 1 tab at the onset of headache and can repeat in one hour if symptoms presist.  do not take more than 2 pills in a 24 hour period    Ibuprofen 600 Mg Tabs (Ibuprofen) .Marland Kitchen... 1 tab by mouth three times a day as needed for heache  Orders: [facility]- Est Level  3 (54098)  Complete Medication List: 1)  Prenavite Multiple Vitamin 28-0.8 Mg Tabs (Prenatal vit-fe fumarate-fa) .... One tab by mouth qday 2)  Sumatriptan Succinate 50 Mg Tabs (Sumatriptan succinate) .Marland Kitchen.. 1 tab at the onset of headache and can repeat in one hour if symptoms presist.  do not take more than 2 pills in a 24 hour period 3)  Ibuprofen 600 Mg Tabs (Ibuprofen) .Marland Kitchen.. 1 tab by mouth three times a day as needed for heache 4)  Promethazine Hcl 12.5 Mg Tabs (Promethazine hcl) .Marland Kitchen.. 1 tab by mouth three times a day as needed for nausea  Patient Instructions: 1)  Nice to meet you 2)  I am giving you some medicine now to help with the pain immediately.  3)  I will give you some medicine to help with headache when they occur 4)  The name of the medicine is imitrex.  take 1 pill at the onset of headache and can repeat 1 hour afterward if still have but do not take more than 2 pills in 1 day.  5)  At home you can take motrin 600mg  by mouth three times a day for the pain for the next 2 days then as needed.  6)  I am also giving you a medicine for nausea called phenergen.  7)  Keep your appointment on friday to see Dr. Wallene Huh and to have you blood pressure checked again.  Prescriptions: PROMETHAZINE HCL 12.5  MG TABS (PROMETHAZINE HCL) 1 tab by mouth three times a day as needed for nausea  #30 x 1   Entered and Authorized by:   Antoine Primas DO   Signed by:   Antoine Primas DO on 01/09/2011   Method used:   Electronically to        [facility]       [address]       [place], [place]  [address]  Ph: [phone]       Fax: [phone]   RxID:   [id number] IBUPROFEN 600 MG TABS (IBUPROFEN) 1 tab by mouth three times a day as needed for heache  #90 x 1   Entered and Authorized by:   Antoine Primas DO   Signed by:   Antoine Primas DO on 01/09/2011   Method used:   Electronically to        [facility]       [address]       [place], [place]  [address]       Ph: [phone]       Fax: [phone]   RxID:   [id number] SUMATRIPTAN SUCCINATE 50 MG TABS (SUMATRIPTAN SUCCINATE) 1 tab at the onset of headache and can repeat in one hour if symptoms presist.  Do not take more than 2 pills in a 24 hour period  #30 x 0   Entered and Authorized by:   Antoine Primas DO   Signed by:   Antoine Primas DO on 01/09/2011   Method used:   Electronically to        [facility]       [address]       [place], [place]  [address]       Ph: [phone]       Fax: [phone]   RxID:   [id number]    Orders Added: 1)  [facility]- Est Level  3 [99213]  Appended Document: headache/ls   Medication Administration  Injection # 1:    Medication: EMR miscellaneous medications    Diagnosis: HEMI MIGRAINE W/O INTRACT W/O STATUS MIGRAINOSUS (ICD-346.30)    Route: SQ    Site: R arm    Exp Date: 01/17/2012    Lot #: [id number]    Mfr: [employer]    Comments: Patient recieved 6mg  of Sumatriptan Succinate     Patient tolerated injection without complications    Given by: Garen Grams LPN (January 09, 2011 10:00 AM)  Injection # 2:    Medication:  Promethazine up to 50mg     Diagnosis: HEMI MIGRAINE W/O INTRACT W/O STATUS MIGRAINOSUS (ICD-346.30)    Route: IM    Site: RUOQ gluteus    Exp Date: 07/16/2012    Lot #: [id number]    Mfr: [employer]    Comments: Patient recieved 12.5mg  of Phenergan    Patient tolerated injection without complications    Given by: Garen Grams LPN (January 09, 2011 10:02 AM)  Orders Added: 1)  EMR miscellaneous medications [EMRORAL] 2)  Promethazine up to 50mg  [J2550]

## 2011-01-17 NOTE — Assessment & Plan Note (Signed)
Summary: problems with IUD/eo   Vital Signs:  Patient profile:   40 year old female Height:      57 inches Weight:      177.6 pounds BMI:     38.57 Temp:     98.2 degrees F oral Pulse rate:   97 / minute BP sitting:   133 / 84  (left arm) Cuff size:   regular  Vitals Entered By: Garen Grams LPN (January 11, 2011 3:02 PM) CC: Irregular periods with IUD Is Patient Diabetic? No Pain Assessment Patient in pain? no        Primary Care Provider:  Bobby Rumpf  MD  CC:  Irregular periods with IUD.  History of Present Illness: 1) IUD concerns: Mirena IUD in place for approximately 18 months since birth of child.  Prior to this, had Paraguard in place; she had regular periods at 28 day intervals but also had menorrhagia and subsequent iron deficiency anemia. Since starting on Mirena her menorrhagia has improved - but now she has 2-3 episodes of unpredictable light spotting per month, lasting about 1-2 days - she is a devout Muslim and the unpedictable nature of her periods interferes with her prayer schedule as she is not allowed to pray while she is having her period. Denies history of blood clots, tobacco use, history of breast cancer in first degree relative. Does have history of migraines as below.  2) Migraines: History of migraine headache - occurs 1-2 times per year. Last migraine was on 01/09/11 - started on ibuprofen and imitrex and phenergan as prn abortive therapies after treatment in office. Headaches unilateral, throbbing, associated with nausea and photophobia when they occur. Headache has since resolved - she has not had to use any abortive therapies since last seen.  Denies chest pain, dyspnea, focal neurological signs.      Habits & Providers  Alcohol-Tobacco-Diet     Tobacco Status: never  Current Medications (verified): 1)  Prenavite Multiple Vitamin 28-0.8 Mg Tabs (Prenatal Vit-Fe Fumarate-Fa) .... One Tab By Mouth Qday 2)  Sumatriptan Succinate 50 Mg Tabs  (Sumatriptan Succinate) .Marland Kitchen.. 1 Tab At The Onset of Headache and Can Repeat in One Hour If Symptoms Presist.  Do Not Take More Than 2 Pills in A 24 Hour Period 3)  Ibuprofen 600 Mg Tabs (Ibuprofen) .Marland Kitchen.. 1 Tab By Mouth Three Times A Day As Needed For Heache 4)  Promethazine Hcl 12.5 Mg Tabs (Promethazine Hcl) .Marland Kitchen.. 1 Tab By Mouth Three Times A Day As Needed For Nausea 5)  Ortho-Cept (28) 0.15-30 Mg-Mcg Tabs (Desogestrel-Ethinyl Estradiol) .... One Tab By Mouth Qday  Allergies (verified): No Known Drug Allergies  Physical Exam  General:  pleasant, well appearing,  Lungs:  Normal respiratory effort, chest expands symmetrically. Lungs are clear to auscultation, no crackles or wheezes. Heart:  Normal rate and regular rhythm. S1 and S2 normal without gallop, murmur, click, rub or other extra sounds.   Impression & Recommendations:  Problem # 1:  DYSFUNCTIONAL UTERINE BLEEDING (ICD-626.8) Assessment Unchanged  Irregular periods secondary to Mirena. Will start low dose OCP given patient desire for more regular periods; hopefully this should regulate periods. Discussed with patient that we should avoid using Paraguard given prior severe menorrhagia and iron deficiency anemia. Reviewed patient history prior to starting OCP - no prior history of smoking or DVT, no family history breast cancer. Patient with history of migraine headache - advised that OCPs could potentially worsen this and she should stop if worse. Follow up in  three months after OCP trail. Advised to keep menstrual diary.   Orders: [facility]- Est  Level 4 (04540)  Problem # 2:  MIGRAINE HEADACHE (ICD-346.90) Assessment: Improved  Abortive therapy as below. Migraines not occuring with enough frequency at this time to warrant prophylactic therapy. Caution with OCP trial and migraine history - watch for worsening symptoms.   Her updated medication list for this problem includes:    Sumatriptan Succinate 50 Mg Tabs (Sumatriptan succinate)  .Marland Kitchen... 1 tab at the onset of headache and can repeat in one hour if symptoms presist.  do not take more than 2 pills in a 24 hour period    Ibuprofen 600 Mg Tabs (Ibuprofen) .Marland Kitchen... 1 tab by mouth three times a day as needed for heache  Orders: [facility]- Est  Level 4 (98119)  Complete Medication List: 1)  Prenavite Multiple Vitamin 28-0.8 Mg Tabs (Prenatal vit-fe fumarate-fa) .... One tab by mouth qday 2)  Sumatriptan Succinate 50 Mg Tabs (Sumatriptan succinate) .Marland Kitchen.. 1 tab at the onset of headache and can repeat in one hour if symptoms presist.  do not take more than 2 pills in a 24 hour period 3)  Ibuprofen 600 Mg Tabs (Ibuprofen) .Marland Kitchen.. 1 tab by mouth three times a day as needed for heache 4)  Promethazine Hcl 12.5 Mg Tabs (Promethazine hcl) .Marland Kitchen.. 1 tab by mouth three times a day as needed for nausea 5)  Ortho-cept (28) 0.15-30 Mg-mcg Tabs (Desogestrel-ethinyl estradiol) .... One tab by mouth qday Prescriptions: ORTHO-CEPT (28) 0.15-30 MG-MCG TABS (DESOGESTREL-ETHINYL ESTRADIOL) one tab by mouth qday  #30 x 3   Entered and Authorized by:   Bobby Rumpf  MD   Signed by:   Bobby Rumpf  MD on 01/11/2011   Method used:   Electronically to        [facility]       [address]       [place], [place]  [address]       Ph: [phone]       Fax: [phone]   RxID:   [id number]    Orders Added: 1)  [facility]- Est  Level 4 [[address]]

## 2011-03-22 LAB — POCT RAPID STREP A (OFFICE): Streptococcus, Group A Screen (Direct): NEGATIVE

## 2011-03-25 LAB — CBC
HCT: 32.8 % — ABNORMAL LOW (ref 36.0–46.0)
HCT: 35.5 % — ABNORMAL LOW (ref 36.0–46.0)
Hemoglobin: 12 g/dL (ref 12.0–15.0)
MCV: 76.4 fL — ABNORMAL LOW (ref 78.0–100.0)
Platelets: 135 10*3/uL — ABNORMAL LOW (ref 150–400)
RBC: 4.23 MIL/uL (ref 3.87–5.11)
RBC: 4.33 MIL/uL (ref 3.87–5.11)
RDW: 18.3 % — ABNORMAL HIGH (ref 11.5–15.5)
WBC: 6.4 10*3/uL (ref 4.0–10.5)

## 2011-03-25 LAB — RPR: RPR Ser Ql: NONREACTIVE

## 2011-03-27 LAB — CBC
HCT: 32 % — ABNORMAL LOW (ref 36.0–46.0)
Hemoglobin: 10.7 g/dL — ABNORMAL LOW (ref 12.0–15.0)
MCHC: 33.4 g/dL (ref 30.0–36.0)
MCV: 76.6 fL — ABNORMAL LOW (ref 78.0–100.0)
Platelets: 128 10*3/uL — ABNORMAL LOW (ref 150–400)
RBC: 4.18 MIL/uL (ref 3.87–5.11)
WBC: 9.3 10*3/uL (ref 4.0–10.5)

## 2011-03-27 LAB — AMYLASE: Amylase: 25 U/L — ABNORMAL LOW (ref 27–131)

## 2011-03-27 LAB — URINALYSIS, ROUTINE W REFLEX MICROSCOPIC
Glucose, UA: NEGATIVE mg/dL
Nitrite: NEGATIVE
Protein, ur: NEGATIVE mg/dL
Urobilinogen, UA: 0.2 mg/dL (ref 0.0–1.0)

## 2011-03-27 LAB — COMPREHENSIVE METABOLIC PANEL
CO2: 22 mEq/L (ref 19–32)
Creatinine, Ser: <0.3 mg/dL — ABNORMAL LOW (ref 0.4–1.2)
Glucose, Bld: 122 mg/dL — ABNORMAL HIGH (ref 70–99)
Potassium: 3.5 mEq/L (ref 3.5–5.1)
Sodium: 135 mEq/L (ref 135–145)
Total Bilirubin: 0.4 mg/dL (ref 0.3–1.2)

## 2011-04-30 NOTE — Op Note (Signed)
NAMEHANH, KERTESZ               ACCOUNT NO.:  [id number]   MEDICAL RECORD NO.:  [id number]          PATIENT TYPE:  AMB   LOCATION:  SDC                           [facility]:  [facility]   PHYSICIAN:  Tanya S. Shawnie Pons, M.D.   DATE OF BIRTH:  [birth year]   DATE OF PROCEDURE:  05/23/2008  DATE OF DISCHARGE:                               OPERATIVE REPORT   PREOPERATIVE DIAGNOSIS:  Missed abortion.   POSTOPERATIVE DIAGNOSIS:  Missed abortion.   PROCEDURE:  Suction dilation and curettage.   SURGEON:  Shelbie Proctor. Shawnie Pons, MD.   ASSISTANT:  None.   ANESTHESIA:  MAC and local with Dr. Arby Barrette.   FINDINGS:  Uterus 12-week size.   SPECIMENS:  Uterine contents to pathology.   ESTIMATED BLOOD LOSS:  Minimal.   COMPLICATIONS:  None known.   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  Ultrasound shows a 10-week missed AB.  This was confirmed on the day of the procedure.   PROCEDURE:  The patient was taken to the OR.  She was placed dorsal  lithotomy in Allen stirrups.  She was prepped and draped in usual  sterile fashion.  A red rubber catheter was used to drain the bladder.  Speculum was used to visualize the cervix.  It was grasped anteriorly  with a single-tooth tenaculum and a paracervical block was performed  with local anesthetic.  The uterus was sounded at 12 cm.  The uterus was  sequentially dilated with Hegar dilators.  A 10-French suction curette  was then passed and three passes were made until very minimal tissue was  removed.  Sharp curettage was then performed until a gritty surface was  found in all four quadrants.  The sharp suction curette was passed one  more time with minimal amount of blood coming from there.  Following  this, all  instruments were removed from the vagina.  Bimanual massage was then  used to create a firm uterus and excellent hemostasis.   All instrument  and lap counts were correct x2.  The patient was awake and taken to  recovery room in stable condition.      Shelbie Proctor. Shawnie Pons, M.D.  Electronically Signed     TSP/MEDQ  D:  05/23/2008  T:  05/24/2008  Job:  [id number]

## 2011-04-30 NOTE — Op Note (Signed)
Susan Hopkins, Susan Hopkins               ACCOUNT NO.:  [id number]   MEDICAL RECORD NO.:  [id number]          PATIENT TYPE:  INP   LOCATION:  9107                          [facility]   PHYSICIAN:  Carrington Clamp, M.D. DATE OF BIRTH:  [birth year]   DATE OF PROCEDURE:  05/24/2009  DATE OF DISCHARGE:                               OPERATIVE REPORT   PREOPERATIVE DIAGNOSES:  Previous cesarean section, desires repeat at  term.   POSTOPERATIVE DIAGNOSES:  Previous cesarean section, desires repeat at  term.   PROCEDURE:  A low transverse cesarean section.   SURGEON:  Carrington Clamp, MD   ASSISTANT:  Miguel Aschoff, MD   ANESTHESIA:  Spinal.   FINDINGS:  Female infant, vertex presentation, Apgars 8 and 9.  Weight 8  pounds 13 ounces.  Body cord seen.  Normal tubes, ovaries, and uterus  otherwise seen.   SPECIMENS:  None.   ESTIMATED BLOOD LOSS:  300 mL.   IV FLUIDS:  4500 mL.   URINE OUTPUT:  350 mL.   COMPLICATIONS:  None.   MEDICATIONS:  Cefotan and Pitocin.   Counts were correct x3.   TECHNIQUE:  After adequate spinal anesthesia was achieved, the patient  was prepped and draped in the usual sterile fashion in the dorsal supine  position with leftward tilt.  A Pfannenstiel skin incision was made with  a scalpel and carried down to the fascia with a Bovie cautery.  Fascia  was incised in the midline with the scalpel and carried in transverse  curvilinear with the Mayo scissors.  The rectus muscles were identified  and split in the midline carefully.  A bowel-free portion of peritoneum  was entered into with a hemostat bluntly and then the peritoneum was  incised in the superior and inferior manner with good visualization of  bowel and the bladder.   There was a small adhesion of the anterior peritoneum to the uterine  wall.  This was taken down with the Bovie cautery.  The Alexis  instrument was then able to be put into the incision and retraction  affected.  The  vesicouterine fascia was then tented up and incised in a  transverse curvilinear manner with the Metzenbaum scissors.  The bladder  was retracted bluntly inferiorly, and a 2-cm incision was made in the  upper portion of the lower uterine segment until the amnion was noted.  The amnion was ruptured and the uterine incision extended bluntly in a  transverse curvilinear manner.   The baby was identified in the vertex presentation.  Attempts were made  to deliver the baby manually.  The vacuum had to be placed on the baby  and there were 3 pop offs affected.  The baby was finally delivered  after both the uterine and the skin incision had been extended a little  bit with direct visualization of all the underlying structures.  The  baby was bulb suctioned.  Cord was clamped and cut.  The cord blood was  obtained.  The baby was handed to awaiting pediatrics.  The placenta was  delivered manually and the uterus cleared  of all debris.   A running lock stitch of 0 Vicryl was used to close the uterine  incision.  An imbricating layer was performed over that as well.  Once  the uterine incision was found to be hemostatic, irrigation was  performed and the ovaries and tubes visually inspected.  The Alexis  instrument was then removed and the peritoneum was closed with running  stitch of 2-0 Vicryl.  This incorporated the rectus muscles as separate  layer.  The fascia was then closed with a running stitch of 0 Vicryl.  The subcutaneous tissue was rendered hemostatic with Bovie cautery and  irrigation.  This was then closed with a interrupted stitches of 2-0  plain gut.  The skin was closed with staples.  The patient tolerated the  procedure well and returned to recovery room in stable condition.      Carrington Clamp, M.D.  Electronically Signed     MH/MEDQ  D:  05/24/2009  T:  05/25/2009  Job:  [id number]

## 2011-05-02 ENCOUNTER — Encounter: Payer: Self-pay | Admitting: Family Medicine

## 2011-05-02 ENCOUNTER — Ambulatory Visit (INDEPENDENT_AMBULATORY_CARE_PROVIDER_SITE_OTHER): Payer: Self-pay | Admitting: Family Medicine

## 2011-05-02 VITALS — BP 129/84 | HR 74 | Wt 169.0 lb

## 2011-05-02 DIAGNOSIS — K8020 Calculus of gallbladder without cholecystitis without obstruction: Secondary | ICD-10-CM

## 2011-05-02 DIAGNOSIS — K8050 Calculus of bile duct without cholangitis or cholecystitis without obstruction: Secondary | ICD-10-CM

## 2011-05-02 NOTE — Progress Notes (Signed)
  Subjective:    Patient ID: Susan Hopkins, female    DOB: 03-Jul-1971, 40 y.o.   MRN: [id number]  HPI  1. 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 She states the pain comes and goes and is low grade. No fevers. No diarrhea, no n/v. No weight loss. No jaundice. No easy bruising.  She has attacks of pain with fatty food that radiates from the RUQ to the right scapula and shoulder.  She has no abdominal masses/tendereness on exam. She has an U/S from 05 that showed gallstones.  Review of Systems See HPI    Objective:   Physical Exam  Abdominal: Soft. Normal appearance and bowel sounds are normal. She exhibits no shifting dullness, no distension, no pulsatile liver, no fluid wave, no abdominal bruit, no ascites and no mass. There is no hepatosplenomegaly, splenomegaly or hepatomegaly. There is no tenderness. There is no rigidity, no rebound, no guarding, no CVA tenderness, no tenderness at McBurney's point and negative Murphy's sign. No hernia. Hernia confirmed negative in the ventral area, confirmed negative in the right inguinal area and confirmed negative in the left inguinal area.        Assessment & Plan:  1. Will refer patient to General Surgery for Elective Laparoscopic Cholecystectomy - she is going to go back to [place] to visit home and wants to be referred when she comes back.

## 2011-05-03 NOTE — Discharge Summary (Signed)
Susan Hopkins, Susan Hopkins               ACCOUNT NO.:  [id number]   MEDICAL RECORD NO.:  [id number]          PATIENT TYPE:  INP   LOCATION:  9107                          [facility]   PHYSICIAN:  Ilda Mori, M.D.   DATE OF BIRTH:  [birth year]   DATE OF ADMISSION:  05/24/2009  DATE OF DISCHARGE:  05/27/2009                               DISCHARGE SUMMARY   FINAL DIAGNOSES:  Intrauterine pregnancy at term, history of previous  cesarean section, and the patient desires repeat cesarean section.   PROCEDURE:  Repeat low transverse cesarean section.   SURGEON:  Carrington Clamp, MD   ASSISTANT:  Miguel Aschoff, MD   COMPLICATIONS:  None.   This 40 year old G4, P2-0-1-2, presents at term for repeat cesarean  section.  The patient had a prior cesarean section with her last  pregnancy secondary to breech presentation.  The patient also expressed  her desire for repeat cesarean section at this time especially since her  cervix was unfavorable.  The patient's antepartum course up to this  point had been complicated by advanced maternal age.  The patient  declined all of genetic screening and testing and has had no other  complications in this pregnancy.  She was taken to the operating room on  May 24, 2009, by Dr. Carrington Clamp, where a repeat low transverse  cesarean section was performed with the delivery of an 8 pounds 13  ounces female infant with Apgars of 8 and 9.  There was a body cord  noted.  Delivery went without complications.  The patient's  postoperative course was benign without any significant fevers.  She was  felt ready for discharge on postoperative day #3 and was sent home on a  regular diet, told to decrease activities, told to continue her  vitamins, was given a prescription for Percocet 1-2 every 4-6 hours as  needed for pain, told to use over-the-counter ibuprofen up to 600 mg  every 6 hours as needed for pain, was to follow up in the office in 2  weeks to see Dr.  Henderson Cloud.   LABS ON DISCHARGE:  The patient had a hemoglobin of 11.3, white blood  cell count of 7.0, and platelets of 135,000.      Leilani Able, P.A.-C.      Ilda Mori, M.D.  Electronically Signed    MB/MEDQ  D:  06/05/2009  T:  06/06/2009  Job:  [id number]

## 2011-05-03 NOTE — Op Note (Signed)
NAME:  Susan Hopkins, Susan Hopkins                       ACCOUNT NO.:  [id number]   MEDICAL RECORD NO.:  [id number]                   PATIENT TYPE:  INP   LOCATION:  9126                                 [facility]   PHYSICIAN:  Lesly Dukes, M.D.              DATE OF BIRTH:  18-Nov-1971   DATE OF PROCEDURE:  06/21/2004  DATE OF DISCHARGE:                                 OPERATIVE REPORT   PREOPERATIVE DIAGNOSES:  40 year old, para 1-0-0-1 female at 45 weeks and 5  days estimated gestational age with gestational diabetes and estimated fetal  weight approximately 4500 g.   POSTOPERATIVE DIAGNOSES:  40 year old, para 1-0-0-1 female at 24 weeks and 5  days estimated gestational age with gestational diabetes and estimated fetal  weight approximately 4500 g.   PROCEDURE:  Primary low flap transverse cesarean section.   SURGEON:  Lesly Dukes, M.D.   ASSISTANT:  Conni Elliot, M.D.   ANESTHESIA:  Spinal.   ESTIMATED BLOOD LOSS:  1000 mL.   PATHOLOGY:  None.   FINDINGS:  A viable female infant, Apgar 9, 1 and 9, 5, LOP vertex  presentation, weight equals 8 pounds 12 ounces.  Grossly normal placenta  with three vessel cord, clear fluid, mild uterine atony resolved with  Methergine x1.  Grossly normal uterus, ovaries and fallopian tubes and NICU  attending the delivery.   DESCRIPTION OF PROCEDURE:  After informed consent was obtained the patient  was taken to the operating room where spinal anesthesia was found to be  adequate.  The patient was placed in the dorsal supine position with a  leftward tilt and prepared and draped in the normal sterile fashion. A Foley  was in the bladder. A Pfannenstiel skin incision was made with the scalpel  and carried down to the underlying layer of fascia. The fascia was incised  in the midline and this incision was extended bilaterally with the Mayo  scissors. The superior and inferior aspect of the fascial incision were  grasped with Kocher  clamps, tinted up and dissected off sharply and bluntly  __________ rectus muscles. The rectus muscles were separated in the midline,  the peritoneum was then identified, tinted up and entered sharply with the  Metzenbaum scissors. The incision was extended both superiorly and  inferiorly with good position of the bladder. The bladder blade was  inserted, the vesicouterine peritoneum was then identified, tinted up and  entered sharply with the Metzenbaum scissors. This incision was extended  bilaterally and the bladder flap was created digitally.  The bladder blade  was reinserted. The uterine incision was made in a transverse fashion in the  lower uterine segment with the scalpel and this incision was extended  bilaterally with the Mayo scissors.  The amniotic sac was ruptured and the  head was delivered atraumatically as described above. The nose and mouth  were suctioned. The rest of the baby's body delivered easily. The  cord was  clamped and cut and the baby was handed off to the waiting pediatrician.  Cord blood was sent for type and screen. The placenta delivered easily and  spontaneously and had a three vessel cord. The uterus was exteriorized and  cleared of all clots and debris. Vigorous uterine massage was then  implemented to help with uterine atony.  One dose of Methergine was given by  the anesthesiologist which helped contract the uterus.  The uterus was then  closed with #0 Vicryl in a running locked fashion. Good hemostasis was  noted. The uterus was then returned to the abdomen and noted to be  hemostatic off tension. The gutters were cleared of all clots and debris and  the intraabdominal cavity was irrigated.  The uterine incision was noted to  be hemostatic one last time. The rectus muscles and peritoneum were  hemostatic. The fascia was then closed with #0 Vicryl in a running fashion.  Good hemostasis was noted. The subcutaneous tissue was copiously irrigated  and noted  to be hemostatic. The skin was closed with staples, no dressing  was placed on the abdomen. The patient tolerated the procedure well. Sponge,  lap, instrument and needle counts were correct x2 and the patient went to  the recovery room in stable condition.                                               Lesly Dukes, M.D.    Lora Paula  D:  06/21/2004  T:  06/21/2004  Job:  [id number]

## 2011-05-03 NOTE — Discharge Summary (Signed)
NAMESHAYLYN, BAWA                       ACCOUNT NO.:  [id number]   MEDICAL RECORD NO.:  [id number]                   PATIENT TYPE:  INP   LOCATION:  9126                                 [facility]   PHYSICIAN:  Lesly Dukes, M.D.              DATE OF BIRTH:  11-17-1971   DATE OF ADMISSION:  06/21/2004  DATE OF DISCHARGE:  06/24/2004                                 DISCHARGE SUMMARY   ADMISSION DIAGNOSIS:  A 40 year old para 1-0-0-1 female at 46 weeks and 5  days estimated gestational age with gestational diabetes and an estimated  fetal weight of approximately 1400 grams.   DISCHARGE DIAGNOSES:  1. Status post low transverse cesarean section for large for gestational     age.  2. Gestational diabetes.   DISCHARGE MEDICATIONS:  1. Ibuprofen 600 mg one tablet p.o. q.6h. p.r.n.  2. Prenatal vitamins one tablet p.o. daily.  3. Percocet 5/325 mg 1-2 tablets p.o. q.4-6h. p.r.n. pain.   DISPOSITION:  The patient was discharged to home.   FOLLOW UP:  1. The patient is to have surgical staples removed by [facility] in 2 days, which     will be June 26, 2004.  2. The patient is to also follow up at the family practice center in 6     weeks.  Family practice will be informed to follow up with a 3-hour or     glucose tolerance test at 6 weeks.   CONSULTS:  None.   PROCEDURES:  Primary low flap transverse cesarean section.   BRIEF ADMISSION HISTORY:  Ms. Rahe is a 40 year old, G2, P2-0-0-2, who  was a direct admit for low transverse cesarean section for large for  gestational age and gestational diabetes.   HOSPITAL COURSE:  The patient received a primary low flat transverse  cesarean section performed by Dr. Elsie Lincoln, who was assisted by Corky Sox.  The patient received spinal anesthesia.  Estimated blood loss  during surgery was 1000 ml.  Cesarean section delivered a viable female infant  with Apgars of 9 at 1 minute and 9 at 5 minutes.  Placenta was grossly  normal with a three vessel cord, clear fluid, mild uterine atony resolved  with Methergine x1 done.  NICU attended delivery.   POSTOPERATIVE LABORATORIES:  Hemoglobin 10.3, hematocrit 32.4 one day status  post surgery.   The patient did not have any complications during this hospitalization.  The  patient has voiced that she has good pain control, and is ready for  discharge.  Vital signs are stable.  The patient is planning to breast feed  the infant.  Undecided about circumcision for now.   FOLLOW UP:  1. The patient will follow up in [facility] for staple removal in 2 days.  2. Will also follow up in the family medicine clinic in 6 weeks for     postpartum exam and oral glucose tolerance test.  Bonnita Hollow, M.D.               Lesly Dukes, M.D.    VRE/MEDQ  D:  06/24/2004  T:  06/25/2004  Job:  [id number]   cc:   [facility]

## 2011-08-17 ENCOUNTER — Inpatient Hospital Stay (INDEPENDENT_AMBULATORY_CARE_PROVIDER_SITE_OTHER)
Admission: RE | Admit: 2011-08-17 | Discharge: 2011-08-17 | Disposition: A | Discharge status: Home / Self Care | Payer: Self-pay | Source: Ambulatory Visit | Attending: Family Medicine | Admitting: Family Medicine

## 2011-08-17 DIAGNOSIS — M545 Low back pain, unspecified: Secondary | ICD-10-CM

## 2011-08-17 LAB — POCT URINALYSIS DIP (DEVICE)
Glucose, UA: NEGATIVE mg/dL
Leukocytes, UA: NEGATIVE
Nitrite: NEGATIVE
Protein, ur: NEGATIVE mg/dL
Specific Gravity, Urine: >=1.03 (ref 1.005–1.030)
Urobilinogen, UA: 0.2 mg/dL (ref 0.0–1.0)

## 2011-08-26 ENCOUNTER — Telehealth: Payer: Self-pay | Admitting: Family Medicine

## 2011-08-26 NOTE — Telephone Encounter (Signed)
Describes it as a lesion that is not on her lower eyelid but just below her eye.  Says the area is about the size of the tip of her finger.  No drainage but states that it does burn and itch.  Does not remember getting bitten by any insect.  Advised her to clean it with soap and water and to avoid putting anything on it with dye or perfumes.  She has an appointment at 9 am tomorrow but told her if it gets larger in size or begins to bother her she may need to go to the nearest [facility].  Patient agreeable.

## 2011-08-26 NOTE — Telephone Encounter (Signed)
Patient is calling concerned about swelling in her left eye.  She has made an appt for tomorrow but still would like to speak to the nurse.

## 2011-08-27 ENCOUNTER — Encounter: Payer: Self-pay | Admitting: Family Medicine

## 2011-08-27 ENCOUNTER — Ambulatory Visit (INDEPENDENT_AMBULATORY_CARE_PROVIDER_SITE_OTHER): Payer: Self-pay | Admitting: Family Medicine

## 2011-08-27 VITALS — BP 166/84 | HR 80 | Temp 98.0°F | Ht 65.0 in | Wt 175.0 lb

## 2011-08-27 DIAGNOSIS — L259 Unspecified contact dermatitis, unspecified cause: Secondary | ICD-10-CM | POA: Insufficient documentation

## 2011-08-27 HISTORY — DX: Unspecified contact dermatitis, unspecified cause: L25.9

## 2011-08-27 MED ORDER — PREDNISONE 20 MG PO TABS: ORAL_TABLET | ORAL | Status: DC

## 2011-08-27 NOTE — Patient Instructions (Signed)
Please come back in 2 days for recheck Come back sooner if the rash gets worse or if you have any vision changes.  Please take 3 tabs of prednison for 1 week, then 2 tabs for one week, then 1 tab for one week  Please make an appointment to meet your new doctor and for blood pressure recheck when you have finished the prednisone

## 2011-08-27 NOTE — Progress Notes (Signed)
  Subjective:    Patient ID: Susan Hopkins, female    DOB: Dec 17, 1970, 40 y.o.   MRN: [id number]  HPI  Pt with one day of left lower eyelid irritaiton.  Two nights ago she cleaned her eyelids with a make up remover wipe that she has used before with no trouble.  She then used a perfume that gave her some irriation in her eye.  She woke up feeling better but over the course of the day, she began to have irritation of the left lower eye lid.  She then developed vesicles there.  She has no irriation of the eye, no vision changes.  No fevers, no rash anywhere else.  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    Review of Systems See above    Objective:   Physical Exam Vital signs reviewed General appearance - alert, well appearing, and in no distress  Eyes-no conjunctival injection or lesions, left lower lid with white vesicular type lesions and surrounding erythema approximately 3cm x 1.5 cm.          Assessment & Plan:  Contact dermatitis Unknown cause, but with a new perfume at home.  Will give steroid taper as if poison ivy adn give red flags for RTC.  See back in 2 days for recheck. precepted with Dr. [place] who does not think this is shingles

## 2011-08-27 NOTE — Assessment & Plan Note (Signed)
Unknown cause, but with a new perfume at home.  Will give steroid taper as if poison ivy adn give red flags for RTC.  See back in 2 days for recheck. precepted with Dr. [place] who does not think this is shingles

## 2011-08-29 ENCOUNTER — Encounter: Payer: Self-pay | Admitting: Family Medicine

## 2011-08-29 ENCOUNTER — Ambulatory Visit (INDEPENDENT_AMBULATORY_CARE_PROVIDER_SITE_OTHER): Payer: Self-pay | Admitting: Family Medicine

## 2011-08-29 VITALS — BP 157/102 | HR 85 | Temp 97.2°F | Wt 173.0 lb

## 2011-08-29 DIAGNOSIS — N90810 Female genital mutilation status, unspecified: Secondary | ICD-10-CM

## 2011-08-29 DIAGNOSIS — Z309 Encounter for contraceptive management, unspecified: Secondary | ICD-10-CM | POA: Insufficient documentation

## 2011-08-29 HISTORY — DX: Female genital mutilation status, unspecified: N90.810

## 2011-08-29 MED ORDER — ACYCLOVIR 5 % EX OINT: TOPICAL_OINTMENT | CUTANEOUS | Status: DC

## 2011-08-29 NOTE — Progress Notes (Signed)
  Subjective:    Patient ID: Susan Hopkins, female    DOB: [birth year], 40 y.o.   MRN: [id number]  HPI Desires removal of IUD because she wants to get back to her "normal self hormonally". Prior to this IUD she had a 10 year copper IUD and had no issues with it. When she switched to the Mirena she noticed that her cycle became irregular and that she had back pain. She would like to have it removed today. She says they had to insert it using ultrasound because her cervix was hidden behind her bladder. She is also due a Pap smear. She does not have a plan for contraception after removal to my reading. She thought it was 6-12 months before she would be able to get pregnant after removal of the Mirena.  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 I was unable to complete the bimanual exam and could not feel the cervix or the adnexa well.       Assessment & Plan:  Contraception: Long discussion. She was surprised learn that once the IUD is removed she could become pregnant almost immediately. She says she still wants it out, will use condoms for contraception. She wants her period to return to normal so she will be "normal hormonally" I was unable to do her Pap smear and unable to visualize her cervix today without lot patient discomfort. I will refer her to [facility] for further evaluation and management.

## 2011-09-04 ENCOUNTER — Telehealth: Payer: Self-pay | Admitting: Family Medicine

## 2011-09-04 NOTE — Telephone Encounter (Signed)
Susan Hopkins is inquiring about referral to [facility] for IUD removal.  Please call back with info as soon as possible.

## 2011-09-04 NOTE — Telephone Encounter (Signed)
lvm for pt and informed her that [facility] did not have any appt and their schedule for Oct is booked and will not have anything until Nov and we will have to call back in a couple of weeks.Laureen Ochs, Viann Shove

## 2011-09-09 ENCOUNTER — Ambulatory Visit (INDEPENDENT_AMBULATORY_CARE_PROVIDER_SITE_OTHER): Payer: Self-pay | Admitting: Family Medicine

## 2011-09-09 ENCOUNTER — Encounter: Payer: Self-pay | Admitting: Family Medicine

## 2011-09-09 VITALS — BP 144/86 | HR 91 | Temp 97.5°F | Wt 169.0 lb

## 2011-09-09 DIAGNOSIS — R358 Other polyuria: Secondary | ICD-10-CM | POA: Insufficient documentation

## 2011-09-09 DIAGNOSIS — R3589 Other polyuria: Secondary | ICD-10-CM

## 2011-09-09 LAB — BASIC METABOLIC PANEL
Calcium: 9.3 mg/dL (ref 8.4–10.5)
Potassium: 3.9 mEq/L (ref 3.5–5.3)
Sodium: 137 mEq/L (ref 135–145)

## 2011-09-09 NOTE — Assessment & Plan Note (Addendum)
Suspect that long high dose of prednisone is contributing to symptoms, would not be surprised if blood sugar is elevated.  She is to stop the prednisone, if she feels odd she should take 1/2 tab qod for 3-4 doses.  She understands adrenal axis suppression.  She should return if she feels that she is still having symptoms. Check BMET

## 2011-09-09 NOTE — Progress Notes (Signed)
  Subjective:    Patient ID: Susan Hopkins, female    DOB: 1971/01/04, 40 y.o.   MRN: [id number]  HPI 5-7 days of feeling thirsty, frequent urination, shaky, palpitations.  Noted that she was placed on a long course of systemic prednisone for contact derm around her left eye.  She has a history of gestational diabetes.  Suspect the prednisone is contributing as she is otherwise healthy.  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   Review of Systems  Constitutional:       See HPI       Objective:   Physical Exam  Constitutional: She appears well-developed and well-nourished.  HENT:  Right Ear: External ear normal.  Left Ear: External ear normal.  Mouth/Throat: Oropharynx is clear and moist.  Neck: Normal range of motion. Neck supple. No thyromegaly present.  Cardiovascular: Normal rate, regular rhythm and normal heart sounds.   Pulmonary/Chest: Effort normal and breath sounds normal.  Lymphadenopathy:    She has no cervical adenopathy.  Skin: No rash noted.  Psychiatric: Her behavior is normal.          Assessment & Plan:

## 2011-09-09 NOTE — Patient Instructions (Signed)
Stop prednisone, if you feel lightheaded take 1/2 tab every other day for 3-4 doses I will send you a letter with your glucose and chemistry panel, if you blood sugar is up, not worry, but have it recheck in 6 weeks

## 2011-09-10 ENCOUNTER — Encounter: Payer: Self-pay | Admitting: Family Medicine

## 2011-09-12 ENCOUNTER — Ambulatory Visit (INDEPENDENT_AMBULATORY_CARE_PROVIDER_SITE_OTHER): Payer: Self-pay | Admitting: Family Medicine

## 2011-09-12 ENCOUNTER — Telehealth: Payer: Self-pay | Admitting: Family Medicine

## 2011-09-12 ENCOUNTER — Encounter: Payer: Self-pay | Admitting: Family Medicine

## 2011-09-12 DIAGNOSIS — T50905A Adverse effect of unspecified drugs, medicaments and biological substances, initial encounter: Secondary | ICD-10-CM | POA: Insufficient documentation

## 2011-09-12 DIAGNOSIS — T887XXA Unspecified adverse effect of drug or medicament, initial encounter: Secondary | ICD-10-CM

## 2011-09-12 LAB — CBC
HCT: 36.8
Hemoglobin: 11.5 — ABNORMAL LOW
MCV: 71.6 — ABNORMAL LOW
RBC: 5.14 — ABNORMAL HIGH
WBC: 5.8

## 2011-09-12 NOTE — Progress Notes (Signed)
  Subjective:    Patient ID: Susan Hopkins, female    DOB: 1971-11-27, 40 y.o.   MRN: [id number]  HPI 1. Shaking/nervousness Patient has been on prednisone for 3 weeks for contact dermatitis around her left eye. She has been experiencing a bad reaction to the prednisone. She is concerned for diabetes as a result of steroids.  Review of Systems  Constitutional: Positive for diaphoresis, activity change and appetite change. Negative for fever, chills, fatigue and unexpected weight change.  HENT: Negative for neck pain.   Respiratory: Negative for shortness of breath.   Cardiovascular: Negative for chest pain, palpitations and leg swelling.  Gastrointestinal: Negative for abdominal pain.  Neurological: Positive for tremors. Negative for dizziness, seizures, syncope, light-headedness and headaches.       Objective:   Physical Exam  Nursing note and vitals reviewed. Constitutional: She appears well-developed and well-nourished. No distress.  HENT:  Head: Normocephalic and atraumatic.  Mouth/Throat: No oropharyngeal exudate.  Cardiovascular: Normal rate, regular rhythm and normal heart sounds.   No murmur heard. Skin: Skin is warm. No rash noted. She is not diaphoretic. No erythema.  Psychiatric:       Crying, tremulous, anxious.      Assessment & Plan:

## 2011-09-12 NOTE — Assessment & Plan Note (Signed)
Stop prednisone. She is only taking 10 daily now. Will recheck her blood sugar in 2 weeks. Diazepam prn for anxiety and tremors.

## 2011-09-12 NOTE — Telephone Encounter (Signed)
Spoke with patient and she is feeling no better. Appointment scheduled for this afternoon.

## 2011-09-12 NOTE — Patient Instructions (Signed)
Stop taking the prednisone

## 2011-09-12 NOTE — Telephone Encounter (Signed)
Pt was here on Monday and is not any better.  Would like to talk to nurse.

## 2011-09-27 ENCOUNTER — Telehealth: Payer: Self-pay | Admitting: Family Medicine

## 2011-09-27 NOTE — Telephone Encounter (Signed)
Susan Hopkins is calling to see if the appt time has been able to be made for her to go to [facility].  She was told back in September that there weren't any appt until November.  She is concerned because she has had a number of cycles.

## 2011-09-27 NOTE — Telephone Encounter (Signed)
To Tonya. Makeisha Jentsch, Maryjo Rochester

## 2011-10-01 NOTE — Telephone Encounter (Signed)
Called [facility] and appt has been made for pt on 11.19.2012 @ 315 pm. lvm to inform pt that she will need to be prepared to make a $20.00 copay unless her insurance card states differently.Laureen Ochs, Viann Shove

## 2011-10-15 ENCOUNTER — Encounter: Payer: Self-pay | Admitting: Family Medicine

## 2011-10-15 ENCOUNTER — Ambulatory Visit (INDEPENDENT_AMBULATORY_CARE_PROVIDER_SITE_OTHER): Payer: Self-pay | Admitting: Family Medicine

## 2011-10-15 VITALS — BP 135/81 | HR 92 | Temp 98.5°F | Ht 65.0 in | Wt 177.0 lb

## 2011-10-15 DIAGNOSIS — Z8742 Personal history of other diseases of the female genital tract: Secondary | ICD-10-CM

## 2011-10-15 DIAGNOSIS — G43909 Migraine, unspecified, not intractable, without status migrainosus: Secondary | ICD-10-CM

## 2011-10-15 DIAGNOSIS — D509 Iron deficiency anemia, unspecified: Secondary | ICD-10-CM

## 2011-10-15 DIAGNOSIS — K219 Gastro-esophageal reflux disease without esophagitis: Secondary | ICD-10-CM

## 2011-10-15 DIAGNOSIS — Z6825 Body mass index (BMI) 25.0-25.9, adult: Secondary | ICD-10-CM

## 2011-10-15 DIAGNOSIS — R5381 Other malaise: Secondary | ICD-10-CM

## 2011-10-15 DIAGNOSIS — K8020 Calculus of gallbladder without cholecystitis without obstruction: Secondary | ICD-10-CM

## 2011-10-15 DIAGNOSIS — E663 Overweight: Secondary | ICD-10-CM | POA: Insufficient documentation

## 2011-10-15 LAB — CBC
Hemoglobin: 12.7 g/dL (ref 12.0–15.0)
MCH: 24.3 pg — ABNORMAL LOW (ref 26.0–34.0)
MCV: 76.6 fL — ABNORMAL LOW (ref 78.0–100.0)
Platelets: 279 10*3/uL (ref 150–400)
RBC: 5.22 MIL/uL — ABNORMAL HIGH (ref 3.87–5.11)
WBC: 5.7 10*3/uL (ref 4.0–10.5)

## 2011-10-15 NOTE — Assessment & Plan Note (Signed)
Encouraged patient to resume walking 45 minutes per day and she done previously. Patient has lost a large amount of weight. She is down from a BMI of greater than 35 to less than 30. She will receive most health benefits in this range although she could benefit from further weight loss and continued exercise.

## 2011-10-15 NOTE — Assessment & Plan Note (Addendum)
[facility]. Patient previously healthy. Benign flow murmur do not believe patient needs extensive preop cards workup.

## 2011-10-15 NOTE — Patient Instructions (Signed)
It was a pleasure to meet you today, Susan Hopkins!  1. For your gallstones, we will place a referral into surgery and get back to you as soon as more information is available such as your appointment time.   2. For your concerns about developing diabetes, I want you to continue to walk 45 minutes per day. Increase your fruits and vegetables to at least 5 servings combined per day. Your blood sugar was not elevated today.   3. For your fatigue, We are going to check a few labs. I will call you if there are any abnormal results. If you do not hear from me, that means everything was normal.   4. You received a flu shot today.   Once again it was great to meet you, Dr. Durene Cal

## 2011-10-15 NOTE — Assessment & Plan Note (Signed)
Has had slightly more fatigue lately. Will check CBC and TSH. Patient had 12 day period in September.

## 2011-10-15 NOTE — Assessment & Plan Note (Deleted)
Has had slightly more fatigue lately. Will check CBC and TSH.

## 2011-10-15 NOTE — Progress Notes (Signed)
Subjective:    Patient ID: Susan Hopkins, female    DOB: Sep 06, 1971, 40 y.o.   MRN: [id number]  HPI patient is a 40 year old female with past medical history GERD, various gynecological issues concerning IUDs, history of gestational diabetes and hx cholelithiasis noted on ultrasound in 2004 presenting due to worsening right upper quadrant pain.  Patient states that 7-8 years ago she had an ultrasound which showed gallstones. She said since that time in the past few years she has had intermittent pain approximately every 2 months present 3-4 hours after fatty meals. She notes that in the last 4-6 months her pain has become worse. One to 2 times a week she will have up to 6-7/10 pain particularly after fatty meals. These events last 4-5 hours. She says at baseline she has 3/10 pain now. She states she was out of the country visiting her brother who is a Careers adviser who prescribed her pain medicines and antibiotics 6 months ago which seemed to help her pain. Her symptoms have gotten worse since that time. She is very anxious about the pain worsening and desires a surgery referral today.  Concerning the patient's most recent office visit here for contact dermatitis, the patient had been on prednisone and had been experiencing symptoms such as extreme hunger, tremor, headache, and sweating. She stopped the prednisone 4 weeks ago. She is very concerned that she may develop diabetes as a result of the prednisone and because she had gestational diabetes. She says she used to walk approximately 45 minutes per day but has stopped this 2 to anxiety over getting diabetes. She had a CBC and office of 114 approximately 1-2 hours after eating. Patient very relieved that her blood sugar was not elevated today.  Review of systems shows the patient has had some fatigue over the last few months as well. She also had a very long period on September 28 of last in 12 days.  Please note patient is to also followup with [facility] as Dr. Jennette Kettle was recently not able to perform a Pap smear due to patient's anatomy.    Review of Systemsnegative except as noted in HPI       Objective:   Physical Exam  Constitutional: She is oriented to person, place, and time. She appears well-developed and well-nourished. No distress.  Eyes: Pupils are equal, round, and reactive to light. Right eye exhibits no discharge. Left eye exhibits no discharge. No scleral icterus.  Neck: Normal range of motion. Neck supple.  Cardiovascular: Normal rate, regular rhythm and normal heart sounds.  Exam reveals no gallop and no friction rub.        2/6 benign systolic ejection murmur noted.   Pulmonary/Chest: Effort normal. No respiratory distress. She has no wheezes. She has no rales.  Abdominal: She exhibits no pulsatile liver and no abdominal bruit. There is no hepatosplenomegaly. There is tenderness. There is no rigidity, no rebound and no guarding.    Musculoskeletal: Normal range of motion.  Neurological: She is alert and oriented to person, place, and time.  Skin: Skin is warm and dry.      Assessment & Plan:  -Patient concerned about developing diabetes after recent prednisone use as well as history of gestational diabetes. CBG here was 114 hour after eating. Would consider getting hemoglobin a1c for screening when patient not currently in affected window of prednisone. Otherwise refer to overweight problem problem list for prevention of diabetes. -flu shot today [facility]

## 2011-10-16 ENCOUNTER — Telehealth: Payer: Self-pay | Admitting: Family Medicine

## 2011-10-16 ENCOUNTER — Encounter: Payer: Self-pay | Admitting: Family Medicine

## 2011-10-16 NOTE — Telephone Encounter (Signed)
Entered in error

## 2011-11-01 ENCOUNTER — Encounter (INDEPENDENT_AMBULATORY_CARE_PROVIDER_SITE_OTHER): Payer: Self-pay | Admitting: Surgery

## 2011-11-04 ENCOUNTER — Encounter: Payer: Self-pay | Admitting: Physician Assistant

## 2011-11-04 ENCOUNTER — Encounter (INDEPENDENT_AMBULATORY_CARE_PROVIDER_SITE_OTHER): Payer: Self-pay | Admitting: Surgery

## 2011-11-04 ENCOUNTER — Ambulatory Visit (INDEPENDENT_AMBULATORY_CARE_PROVIDER_SITE_OTHER): Payer: Self-pay | Admitting: Physician Assistant

## 2011-11-04 VITALS — BP 146/94 | HR 97 | Temp 98.6°F | Ht 66.0 in | Wt 179.4 lb

## 2011-11-04 DIAGNOSIS — Z01419 Encounter for gynecological examination (general) (routine) without abnormal findings: Secondary | ICD-10-CM

## 2011-11-04 DIAGNOSIS — Z30432 Encounter for removal of intrauterine contraceptive device: Secondary | ICD-10-CM

## 2011-11-04 NOTE — Progress Notes (Signed)
Chief Complaint:  Contraception, Gynecologic Exam and Removal IUD   Susan Hopkins is  40 y.o. [phone].  Patient's last menstrual period was 09/13/2011..   She presents complaining of Contraception, Gynecologic Exam and Removal IUD  Pt presents for routine gyn exam and IUD removal. States that [facility] was unable to visualize cervix to preform pap and remove IUD. Reports dislike of irregular menses with Mirena IUD, desires condom use for contraception. Denies abd pain, vag discharge, dysuria,   Obstetrical/Gynecological History: OB History    Grav Para Term Preterm Abortions TAB SAB Ect Mult Living   4 3 3  1  1          Past Medical History: Past Medical History  Diagnosis Date  . ADVANCED MATERNAL AGE 24/02/2008  . DIABETES MELLITUS, GESTATIONAL, HX OF 05/18/2008  . 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 Contact dermatitis 08/27/2011  . ANEMIA, IRON DEFICIENCY, UNSPEC. 02/12/2007  . 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 GERD (gastroesophageal reflux disease)   . Migraine headache     Past Surgical History: Past Surgical History  Procedure Date  . Cesarean section   . Female circumcision   . Tonsillectomy     Family History: Family History  Problem Relation Age of Onset  . Diabetes Mother   . Hypertension Mother   . Hypertension Father     Social History: History  Substance Use Topics  . Smoking status: Never Smoker   . Smokeless tobacco: Never Used  . Alcohol Use: No    Allergies: No Known Allergies  Review of Systems - Negative except what has been reviewed in the HPI  Physical Exam   Blood pressure 146/94, pulse 97, temperature 98.6 F (37 C), temperature source Oral, height 5\' 6"  (1.676 m), weight 179 lb 6.4 oz (81.375 kg), last menstrual period 09/13/2011.  General: General appearance - alert, well appearing, and in no distress, oriented to person, place, and time and overweight Mental status - alert,  oriented to person, place, and time, normal mood, behavior, speech, dress, motor activity, and thought processes, affect appropriate to mood Chest - normal movement, no distress Breasts - breasts appear normal, no suspicious masses, no skin or nipple changes or axillary nodes Musculoskeletal - no muscular tenderness noted, full range of motion without pain, +1 edema of ankles Focused Gynecological Exam: VULVA: Lax female circ, no lesions, VAGINA: normal appearing vagina with normal color and discharge, no lesions, CERVIX: normal appearing cervix without discharge or lesions, Mirena strings visualized ~ 1.5cm below ext os, easily removed intact without difficulty, UTERUS: uterus is normal size, shape, consistency and nontender, ADNEXA: normal adnexa in size, nontender and no masses, PAP: Pap smear done today, thin-prep method  Labs: Pap and mammography ordered.   Assessment: Routine Gyn Exam IUD removal Patient Active Problem List  Diagnoses  . ANEMIA, IRON DEFICIENCY, UNSPEC.  Marland Kitchen GASTROESOPHAGEAL REFLUX, NO ESOPHAGITIS  . FATIGUE  . MIGRAINE HEADACHE  . Symptomatic cholelithiasis  . Overweight (BMI 25.0-29.9)    Plan: Pap results will be mailed, if NL in ~ 2 weeks Mammo scholarship completed today FU prn   Kierre Deines E. 11/04/2011,3:45 PM

## 2011-11-04 NOTE — Progress Notes (Signed)
Patient states Family Practice tried to remove IUD unsuccessfully once.

## 2011-11-04 NOTE — Patient Instructions (Signed)
Pap Test A Pap test checks the cells in the cervix for any infections or changes that may turn into cancer. You should have a Pap test every year if:  You are having sex (intercourse).   You are high risk for cervical cancer. You are high risk if:   You started having sex at a very young age.   You have sex with a lot of different partners.   You smoke.   You have human papillomavirus (HPV).   You have HIV.  BEFORE THE TEST  Schedule the Pap test before or after your menstrual period.   Do not have sex for 2 days before your Pap test.   For 3 days before your Pap test, do not douche or put any creams or medicines in your vagina.  TEST  Try to relax and you will be more comfortable. Try breathing slowly and deeply. Do not tighten up your muscles.   Your doctor will put a tool (speculum) into your vagina that opens it up.   Your doctor will use a special swab or brush to scrape cells from your cervix.   You may have a feeling of pressure or pulling as the cells are being scraped off your cervix.   These cells will be checked under a microscope.  AFTER THE TEST You may have a few spots of blood from your vagina for 2 to 3 days after your Pap test. If you have a lot of bleeding, or it does not stop in a few days, tell your doctor. Finding out the results of your test Ask when your test results will be ready. Make sure you get your test results. WHO SHOULD HAVE A PAP TEST?  The first Pap test should be done at age 48.   Between ages 13 and 73, Pap tests are repeated every 2 years.   Beginning at age 60, you are advised to have a Pap test every 3 years as long as your past 3 PAP tests have been normal.   Some women have medical problems that increase the chance of getting cervical cancer. Talk to your caregiver about these problems. It is especially important to talk to your caregiver if a new problem develops soon after your last Pap test. In these cases, your caregiver may  recommend more frequent screening and Pap tests.   The above recommendations are the same for women who have or have not gotten the vaccine for HPV (Human Papillomavirus).   If you had a hysterectomy for a problem that was not a cancer or a condition that could lead to cancer, then you no longer need Pap tests. However, even if you no longer need a Pap test, a regular exam is a good idea to make sure no other problems are starting.    If you are between ages 66 and 36, and you have had normal Pap tests going back 10 years, you no longer need Pap tests. However, even if you no longer need a Pap test, a regular exam is a good idea to make sure no other problems are starting.    If you have had past treatment for cervical cancer or a condition that could lead to cancer, you need Pap tests and screening for cancer for at least 20 years after your treatment.   If Pap tests have been discontinued, risk factors (such as a new sexual partner)need to be reassessed to determine if screening should be resumed.   Some women may  need screenings more often if they are at high risk for cervical cancer.  Document Released: 01/04/2011 Document Revised: 03/19/2011 Document Reviewed: 10/16/2007 [facility] Patient Information 2012 [place], [place].Birth Control Choices Birth control is the use of any practices, methods, or devices to prevent pregnancy from happening in a sexually active woman.  Below are some birth control choices to help avoid pregnancy.  Not having sex (abstinence) is the surest form of birth control. This requires self-control. There is no risk of acquiring a sexually transmitted disease (STD), including acquired immunodeficiency syndrome (AIDS).   Periodic abstinence requires self-control during certain times of the month.   Calendar method, timing your menstrual periods from month to month.   Ovulation method is avoiding sexual intercourse around the time you produce an egg (ovulate).     Symptotherm method is avoiding sexual intercourse at the time of ovulation, using a thermometer and ovulation symptoms.   Post ovulation method is the timing of sexual intercourse after you ovulated.  These methods do not protect against STDs, including AIDS.  Birth control pills (BCPs) contain estrogen and progesterone hormone. These medicines work by stopping the egg from forming in the ovary (ovulation). Birth control pills are prescribed by a caregiver who will ask you questions about the risks of taking BCPs. Birth control pills do not protect against STDs, including AIDS.   "Minipill" birth control pills have only the progesterone hormone. They are taken every day of each month and must be prescribed by your caregiver. They do not protect against STDs, including AIDS.   Emergency contraception is often call the "morning after" pill. This pill can be taken right after sex or up to five days after sex if you think your birth control failed, you failed to use contraception, or you were forced to have sex. It is most effective the sooner you take the pills after having sexual intercourse. Do not use emergency contraception as your only form of birth control. Emergency contraceptive pills are available without a prescription. Check with your pharmacist.   Condoms are a thin sheath of latex, synthetic material, or lambskin worn over the penis during sexual intercourse. They can have a spermicide in or on them when you buy them. Latex condoms can prevent pregnancy and STDs. "Natural" or lambskin condoms can prevent pregnancy but may not protect against STDs, including AIDS.   Female condoms are a soft, loose-fitting sheath that is put into the vagina before sexual intercourse. They can prevent pregnancy and STDs, including AIDS.   Sponge is a soft, circular piece of polyurethane foam with spermicide in it that is inserted into the vagina after wetting it and before sexual intercourse. It does not  require a prescription from your caregiver. It does not protect against STDs, including AIDS.   Diaphragm is a soft, latex, dome-shaped barrier that must be fitted by a caregiver. It is inserted into the vagina, along with a spermicidal jelly. After the proper fitting for a diaphragm, always insert the diaphragm before intercourse. The diaphragm should be left in the vagina for 6 to 8 hours after intercourse. Removal and reinsertion with a spermicide is always necessary after any use. It does not protect against STDs, including AIDS.   Progesterone-only injections are given every 3 months to prevent pregnancy. These injections contain synthetic progesterone and no estrogen. This hormone stops the ovaries from releasing eggs. It also causes the cervical mucus to thicken and changes the uterine lining. This makes it harder for sperm to survive in  the uterus. It does not protect against STDs, including AIDS.   Birth Control Patch contains hormones similar to those in birth control pills, so effectiveness, risks, and side effects are similar. It must be changed once a week and is prescribed by a caregiver. It is less effective in very overweight women. It does not protect against STDs, including AIDS.   Vaginal Ring contains hormones similar to those in birth control pills. It is left in place for 3 weeks, removed for 1 week, and then a new one is put back into the vagina. It comes with a timer to put in your purse to help you remember when to take it out or put a new one in. A caregiver's examination and prescription is necessary, just like with birth control pills and the patch. It does not protect against STDs, including AIDS.   Estrogen plus progesterone injections are given every 28 to 30 days. They can be given in the upper arm, thigh, or buttocks. It does not protect against STDs, including AIDS.   Intrauterine device (IUD): copper T or progestin filled is a T-shaped device that is put in a woman's  uterus during a menstrual period to prevent pregnancy. The copper T IUD can last 10 years, and the progestin IUD can last 5 years. The progestin IUD can also help control heavy menstrual periods. It does not protect against STDs, including AIDS. The copper T IUD can be used as emergency contraception if inserted within 5 days of having unprotected intercourse.   Cervical cap is a round, soft latex or plastic cup that fits over the cervix and must be fitted by a caregiver. You do not need to use a spermicide with it or remove and insert it every time you have sexual intercourse. It does not protect against STDs, including AIDS.   Spermicides are chemicals that kill or block sperm from entering the cervix and uterus. They come in the form of creams, jellies, suppositories, foam, or tablets, and they do not require a prescription. They are inserted into the vagina with an applicator before having sexual intercourse. This must be repeated every time you have sexual intercourse.   Withdrawal is using the method of the female withdrawing his penis from sexual intercourse before he has a climax and deposits his sperm. It does not protect against STDs, including AIDS.   Female tubal ligation is when the woman's fallopian tubes are surgically sealed or tied to prevent the egg from traveling to the uterus. It does not protect against STDs, including AIDS.   Female sterilization is when the female has his tubes that carry sperm tied off (vasectomy) to stop sperm from entering the vagina during sexual intercourse. It does not protect against STDs, including AIDS.  Regardless of which method of birth control you choose, it is still important that you use some form of protection against STDs. Document Released: 12/02/2005 Document Revised: 01/04/2011 Document Reviewed: 10/19/2009 [facility] Patient Information 2012 [place], [place].

## 2011-11-11 ENCOUNTER — Other Ambulatory Visit: Payer: Self-pay | Admitting: Obstetrics and Gynecology

## 2011-11-11 DIAGNOSIS — Z1231 Encounter for screening mammogram for malignant neoplasm of breast: Secondary | ICD-10-CM

## 2011-11-13 ENCOUNTER — Ambulatory Visit (HOSPITAL_COMMUNITY)
Admission: RE | Admit: 2011-11-13 | Discharge: 2011-11-13 | Disposition: A | Discharge status: Home / Self Care | Payer: Self-pay | Source: Ambulatory Visit | Attending: Obstetrics and Gynecology | Admitting: Obstetrics and Gynecology

## 2011-11-13 DIAGNOSIS — Z1231 Encounter for screening mammogram for malignant neoplasm of breast: Secondary | ICD-10-CM

## 2011-11-18 ENCOUNTER — Encounter (INDEPENDENT_AMBULATORY_CARE_PROVIDER_SITE_OTHER): Payer: Self-pay | Admitting: Surgery

## 2011-11-18 ENCOUNTER — Other Ambulatory Visit (INDEPENDENT_AMBULATORY_CARE_PROVIDER_SITE_OTHER): Payer: Self-pay | Admitting: Surgery

## 2011-11-18 ENCOUNTER — Ambulatory Visit (INDEPENDENT_AMBULATORY_CARE_PROVIDER_SITE_OTHER): Payer: PRIVATE HEALTH INSURANCE | Admitting: Surgery

## 2011-11-18 VITALS — BP 124/84 | HR 60 | Temp 97.0°F | Resp 16 | Ht 66.0 in | Wt 177.5 lb

## 2011-11-18 DIAGNOSIS — K8020 Calculus of gallbladder without cholecystitis without obstruction: Secondary | ICD-10-CM

## 2011-11-18 NOTE — Progress Notes (Signed)
Patient ID: Susan Hopkins, female   DOB: July 01, 1971, 40 y.o.   MRN: [id number]  Chief Complaint  Patient presents with  . New Evaluation    eval of GB     HPI Susan Hopkins is a 40 y.o. female.  Referred by Dr. 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 She has had known gallstones for at least 8 years. She has had several severe episodes. These episodes include severe right upper quadrant abdominal pain nausea vomiting and diarrhea. Recently, she has noticed more frequent but less severe symptoms. She has frequent right upper quadrant pain radiating up to her right shoulder. This is associated with a lot of bloating and nausea. She denies any change in her bowel habits. She also has had some skin itching which may or may not be related to this problem. She has not had a repeat ultrasound or blood work. She did have a severe episode about 2 years ago when she was pregnant. She currently is not pregnant. Past Medical History  Diagnosis Date  . ADVANCED MATERNAL AGE 51/02/2008  . DIABETES MELLITUS, GESTATIONAL, HX OF 05/18/2008  . 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 Contact dermatitis 08/27/2011  . ANEMIA, IRON DEFICIENCY, UNSPEC. 02/12/2007  . 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 GERD (gastroesophageal reflux disease)   . Migraine headache     Past Surgical History  Procedure Date  . Cesarean section   . Female circumcision   . Tonsillectomy     Family History  Problem Relation Age of Onset  . Diabetes Mother   . Hypertension Mother   . Hypertension Father     Social History History  Substance Use Topics  . Smoking status: Never Smoker   . Smokeless tobacco: Never Used  . Alcohol Use: No    No Known Allergies  Current Outpatient Prescriptions  Medication Sig Dispense Refill  . PRENATAL VITAMINS PO Take by  mouth daily.          Review of Systems Review of Systems  Constitutional: Negative for fever, chills and unexpected weight change.  HENT: Negative for hearing loss, congestion, sore throat, trouble swallowing and voice change.   Eyes: Negative for visual disturbance.  Respiratory: Negative for cough and wheezing.   Cardiovascular: Negative for chest pain, palpitations and leg swelling.  Gastrointestinal: Positive for nausea, abdominal pain and abdominal distention. Negative for vomiting, diarrhea, constipation, blood in stool and anal bleeding.  Genitourinary: Negative for hematuria, vaginal bleeding and difficulty urinating.  Musculoskeletal: Negative for arthralgias.  Skin: Negative for rash and wound.  Neurological: Positive for headaches. Negative for seizures and syncope.  Hematological: Negative for adenopathy. Does not bruise/bleed easily.  Psychiatric/Behavioral: Negative for confusion.    Blood pressure 124/84, pulse 60, temperature 97 F (36.1 C), temperature source Temporal, resp. rate 16, height 5\' 6"  (1.676 m), weight 177 lb 8 oz (80.513 kg), last menstrual period 11/06/2011.  Physical Exam Physical Exam  Data Reviewed Ultrasound from 2004 - multiple small shadowing gallstones, but no wall thickening.  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  I discussed the procedure in detail.  The patient was given Agricultural engineer.  We discussed the risks and benefits of a laparoscopic cholecystectomy and possible cholangiogram including, but not limited to bleeding, infection, injury to surrounding structures such  as the intestine or liver, bile leak, retained gallstones, need to convert to an open procedure, prolonged diarrhea, blood clots such as  DVT, common bile duct injury, anesthesia risks, and possible need for additional procedures.  The likelihood of  improvement in symptoms and return to the patient's normal status is good. We discussed the typical post-operative recovery course        Remington Skalsky K. 11/18/2011, 2:45 PM

## 2011-11-18 NOTE — Progress Notes (Signed)
Addended by: Wynona Luna on: 11/18/2011 02:52 PM   Modules accepted: Orders

## 2011-11-18 NOTE — Patient Instructions (Signed)
We will send you to the lab today for some blood work. We will also schedule your surgery.  Avoid any fatty foods.

## 2011-11-19 ENCOUNTER — Other Ambulatory Visit: Payer: Self-pay | Admitting: Obstetrics and Gynecology

## 2011-11-19 DIAGNOSIS — R928 Other abnormal and inconclusive findings on diagnostic imaging of breast: Secondary | ICD-10-CM

## 2011-11-19 LAB — COMPREHENSIVE METABOLIC PANEL
Albumin: 4.2 g/dL (ref 3.5–5.2)
Alkaline Phosphatase: 54 U/L (ref 39–117)
BUN: 9 mg/dL (ref 6–23)
CO2: 25 mEq/L (ref 19–32)
Glucose, Bld: 97 mg/dL (ref 70–99)
Potassium: 3.9 mEq/L (ref 3.5–5.3)
Sodium: 139 mEq/L (ref 135–145)
Total Bilirubin: 0.2 mg/dL — ABNORMAL LOW (ref 0.3–1.2)
Total Protein: 6.9 g/dL (ref 6.0–8.3)

## 2011-11-19 LAB — CBC
HCT: 38.7 % (ref 36.0–46.0)
Hemoglobin: 11.8 g/dL — ABNORMAL LOW (ref 12.0–15.0)
MCH: 24 pg — ABNORMAL LOW (ref 26.0–34.0)
MCHC: 30.5 g/dL (ref 30.0–36.0)
RBC: 4.91 MIL/uL (ref 3.87–5.11)

## 2011-11-19 LAB — LIPASE: Lipase: 28 U/L (ref 0–75)

## 2011-11-19 NOTE — Progress Notes (Signed)
Quick Note:  Please call the patient and let them know that their liver function tests and lipase are normal. ______

## 2011-11-20 ENCOUNTER — Encounter (HOSPITAL_COMMUNITY): Payer: Self-pay | Admitting: Pharmacy Technician

## 2011-11-25 ENCOUNTER — Encounter (HOSPITAL_COMMUNITY)
Admission: RE | Admit: 2011-11-25 | Discharge: 2011-11-25 | Disposition: A | Discharge status: Home / Self Care | Payer: Self-pay | Source: Ambulatory Visit | Attending: Surgery | Admitting: Surgery

## 2011-11-25 ENCOUNTER — Encounter (HOSPITAL_COMMUNITY): Payer: Self-pay

## 2011-11-25 HISTORY — DX: Diarrhea, unspecified: R19.7

## 2011-11-25 LAB — BASIC METABOLIC PANEL
BUN: 8 mg/dL (ref 6–23)
Chloride: 105 mEq/L (ref 96–112)
Creatinine, Ser: 0.43 mg/dL — ABNORMAL LOW (ref 0.50–1.10)
GFR calc Af Amer: >90 mL/min (ref >90)
GFR calc non Af Amer: >90 mL/min (ref >90)
Glucose, Bld: 82 mg/dL (ref 70–99)

## 2011-11-25 LAB — CBC
Hemoglobin: 12.3 g/dL (ref 12.0–15.0)
MCV: 76.5 fL — ABNORMAL LOW (ref 78.0–100.0)
Platelets: 230 10*3/uL (ref 150–400)

## 2011-11-25 LAB — HCG, SERUM, QUALITATIVE: Preg, Serum: NEGATIVE

## 2011-11-25 LAB — SURGICAL PCR SCREEN: Staphylococcus aureus: NEGATIVE

## 2011-11-25 NOTE — Pre-Procedure Instructions (Signed)
20 Susan Hopkins  11/25/2011   Your procedure is scheduled on:  Thurs,Dec 13 @ 0930 Report to [facility] at 0730 AM.  Call this number if you have problems the morning of surgery: [phone]   Remember:   Do not eat food:After Midnight.  May have clear liquids: up to 4 Hours before arrival.(until 5:30am)  Clear liquids include soda, tea, black coffee, apple or grape juice, broth.  Take these medicines the morning of surgery with A SIP OF WATER:    Do not wear jewelry, make-up or nail polish.  Do not wear lotions, powders, or perfumes. You may wear deodorant.  Do not shave 48 hours prior to surgery.  Do not bring valuables to the hospital.  Contacts, dentures or bridgework may not be worn into surgery.  Leave suitcase in the car. After surgery it may be brought to your room.  For patients admitted to the hospital, checkout time is 11:00 AM the day of discharge.   Patients discharged the day of surgery will not be allowed to drive home.  Name and phone number of your driver:   Special Instructions: CHG Shower Use Special Wash: 1/2 bottle night before surgery and 1/2 bottle morning of surgery.   Please read over the following fact sheets that you were given: Pain Booklet, Coughing and Deep Breathing, MRSA Information and Surgical Site Infection Prevention

## 2011-11-27 MED ORDER — CEFAZOLIN SODIUM-DEXTROSE 2-3 GM-% IV SOLR
2.0000 g | INTRAVENOUS | Status: AC
  Administered 2011-11-28: 2 g via INTRAVENOUS
  Filled 2011-11-27: qty 50

## 2011-11-28 ENCOUNTER — Encounter (HOSPITAL_COMMUNITY)
Admission: RE | Disposition: A | Discharge status: Home / Self Care | Payer: Self-pay | Source: Ambulatory Visit | Attending: Surgery

## 2011-11-28 ENCOUNTER — Other Ambulatory Visit (INDEPENDENT_AMBULATORY_CARE_PROVIDER_SITE_OTHER): Payer: Self-pay | Admitting: Surgery

## 2011-11-28 ENCOUNTER — Encounter (HOSPITAL_COMMUNITY): Payer: Self-pay | Admitting: *Deleted

## 2011-11-28 ENCOUNTER — Encounter (HOSPITAL_COMMUNITY): Payer: Self-pay | Admitting: Anesthesiology

## 2011-11-28 ENCOUNTER — Ambulatory Visit (HOSPITAL_COMMUNITY)
Admission: RE | Admit: 2011-11-28 | Discharge: 2011-11-28 | Disposition: A | Discharge status: Home / Self Care | Payer: Self-pay | Source: Ambulatory Visit | Attending: Surgery | Admitting: Surgery

## 2011-11-28 ENCOUNTER — Ambulatory Visit (HOSPITAL_COMMUNITY): Payer: Self-pay

## 2011-11-28 ENCOUNTER — Ambulatory Visit (HOSPITAL_COMMUNITY): Payer: Self-pay | Admitting: Anesthesiology

## 2011-11-28 DIAGNOSIS — R51 Headache: Secondary | ICD-10-CM | POA: Insufficient documentation

## 2011-11-28 DIAGNOSIS — K219 Gastro-esophageal reflux disease without esophagitis: Secondary | ICD-10-CM | POA: Insufficient documentation

## 2011-11-28 DIAGNOSIS — K8010 Calculus of gallbladder with chronic cholecystitis without obstruction: Secondary | ICD-10-CM

## 2011-11-28 DIAGNOSIS — Z01812 Encounter for preprocedural laboratory examination: Secondary | ICD-10-CM | POA: Insufficient documentation

## 2011-11-28 HISTORY — PX: CHOLECYSTECTOMY: SHX55

## 2011-11-28 SURGERY — LAPAROSCOPIC CHOLECYSTECTOMY WITH INTRAOPERATIVE CHOLANGIOGRAM
Anesthesia: General | Site: Abdomen | Wound class: Clean Contaminated

## 2011-11-28 MED ORDER — OXYCODONE HCL 5 MG PO TABS
5.0000 mg | ORAL_TABLET | ORAL | Status: DC | PRN
  Administered 2011-11-28: 5 mg via ORAL

## 2011-11-28 MED ORDER — SODIUM CHLORIDE 0.9 % IV SOLN: 250.0000 mL | INTRAVENOUS | Status: DC | PRN

## 2011-11-28 MED ORDER — ACETAMINOPHEN 325 MG PO TABS: 650.0000 mg | ORAL_TABLET | ORAL | Status: DC | PRN

## 2011-11-28 MED ORDER — NEOSTIGMINE METHYLSULFATE 1 MG/ML IJ SOLN
INTRAMUSCULAR | Status: DC | PRN
  Administered 2011-11-28: 3 mg via INTRAVENOUS

## 2011-11-28 MED ORDER — SODIUM CHLORIDE 0.9 % IJ SOLN: 3.0000 mL | INTRAMUSCULAR | Status: DC | PRN

## 2011-11-28 MED ORDER — GLYCOPYRROLATE 0.2 MG/ML IJ SOLN
INTRAMUSCULAR | Status: DC | PRN
  Administered 2011-11-28: .6 mg via INTRAVENOUS

## 2011-11-28 MED ORDER — ACETAMINOPHEN 650 MG RE SUPP: 650.0000 mg | RECTAL | Status: DC | PRN

## 2011-11-28 MED ORDER — OXYCODONE HCL 5 MG PO TABS
ORAL_TABLET | ORAL | Status: AC
  Administered 2011-11-28: 5 mg via ORAL
  Filled 2011-11-28: qty 1

## 2011-11-28 MED ORDER — ONDANSETRON HCL 4 MG/2ML IJ SOLN: 4.0000 mg | INTRAMUSCULAR | Status: DC | PRN

## 2011-11-28 MED ORDER — OXYCODONE HCL 5 MG PO TABS: 5.0000 mg | ORAL_TABLET | ORAL | Status: DC | PRN

## 2011-11-28 MED ORDER — ROCURONIUM BROMIDE 100 MG/10ML IV SOLN
INTRAVENOUS | Status: DC | PRN
  Administered 2011-11-28: 50 mg via INTRAVENOUS

## 2011-11-28 MED ORDER — SODIUM CHLORIDE 0.9 % IR SOLN
Status: DC | PRN
  Administered 2011-11-28: 1000 mL

## 2011-11-28 MED ORDER — SODIUM CHLORIDE 0.9 % IJ SOLN: 3.0000 mL | Freq: Two times a day (BID) | INTRAMUSCULAR | Status: DC

## 2011-11-28 MED ORDER — IOHEXOL 300 MG/ML  SOLN
INTRAMUSCULAR | Status: DC | PRN
  Administered 2011-11-28: 5 mL via INTRAVENOUS

## 2011-11-28 MED ORDER — MORPHINE SULFATE 2 MG/ML IJ SOLN: 2.0000 mg | INTRAMUSCULAR | Status: DC | PRN

## 2011-11-28 MED ORDER — ONDANSETRON HCL 4 MG/2ML IJ SOLN: 4.0000 mg | Freq: Four times a day (QID) | INTRAMUSCULAR | Status: DC | PRN

## 2011-11-28 MED ORDER — PROPOFOL 10 MG/ML IV EMUL
INTRAVENOUS | Status: DC | PRN
  Administered 2011-11-28: 150 mg via INTRAVENOUS

## 2011-11-28 MED ORDER — ONDANSETRON HCL 4 MG/2ML IJ SOLN
INTRAMUSCULAR | Status: DC | PRN
  Administered 2011-11-28: 4 mg via INTRAVENOUS

## 2011-11-28 MED ORDER — PROMETHAZINE HCL 25 MG/ML IJ SOLN: 12.5000 mg | Freq: Four times a day (QID) | INTRAMUSCULAR | Status: DC | PRN

## 2011-11-28 MED ORDER — BUPIVACAINE-EPINEPHRINE 0.25% -1:200000 IJ SOLN
INTRAMUSCULAR | Status: DC | PRN
  Administered 2011-11-28: 10 mL

## 2011-11-28 MED ORDER — OXYCODONE-ACETAMINOPHEN 5-325 MG PO TABS: 1.0000 | ORAL_TABLET | ORAL | Status: AC | PRN

## 2011-11-28 MED ORDER — LACTATED RINGERS IV SOLN
INTRAVENOUS | Status: DC | PRN
  Administered 2011-11-28 (×2): via INTRAVENOUS

## 2011-11-28 MED ORDER — MIDAZOLAM HCL 5 MG/5ML IJ SOLN
INTRAMUSCULAR | Status: DC | PRN
  Administered 2011-11-28: 2 mg via INTRAVENOUS

## 2011-11-28 MED ORDER — HYDROMORPHONE HCL PF 1 MG/ML IJ SOLN
0.2500 mg | INTRAMUSCULAR | Status: DC | PRN
  Administered 2011-11-28 (×3): 0.25 mg via INTRAVENOUS
  Administered 2011-11-28: 0.5 mg via INTRAVENOUS

## 2011-11-28 MED ORDER — FENTANYL CITRATE 0.05 MG/ML IJ SOLN
INTRAMUSCULAR | Status: DC | PRN
  Administered 2011-11-28 (×2): 100 ug via INTRAVENOUS

## 2011-11-28 SURGICAL SUPPLY — 49 items
APL SKNCLS STERI-STRIP NONHPOA (GAUZE/BANDAGES/DRESSINGS) ×1
APPLIER CLIP ROT 10 11.4 M/L (STAPLE) ×2
APR CLP MED LRG 11.4X10 (STAPLE) ×1
BAG SPEC RTRVL LRG 6X4 10 (ENDOMECHANICALS) ×1
BENZOIN TINCTURE PRP APPL 2/3 (GAUZE/BANDAGES/DRESSINGS) ×2 IMPLANT
BLADE SURG ROTATE 9660 (MISCELLANEOUS) IMPLANT
CANISTER SUCTION 2500CC (MISCELLANEOUS) ×2 IMPLANT
CHLORAPREP W/TINT 26ML (MISCELLANEOUS) ×2 IMPLANT
CLIP APPLIE ROT 10 11.4 M/L (STAPLE) ×1 IMPLANT
CLOTH BEACON ORANGE TIMEOUT ST (SAFETY) ×2 IMPLANT
COVER MAYO STAND STRL (DRAPES) ×2 IMPLANT
COVER SURGICAL LIGHT HANDLE (MISCELLANEOUS) ×2 IMPLANT
DECANTER SPIKE VIAL GLASS SM (MISCELLANEOUS) ×4 IMPLANT
DRAPE C-ARM 42X72 X-RAY (DRAPES) ×2 IMPLANT
DRAPE UTILITY 15X26 W/TAPE STR (DRAPE) ×4 IMPLANT
DRSG TEGADERM 2-3/8X2-3/4 SM (GAUZE/BANDAGES/DRESSINGS) ×3 IMPLANT
DRSG TEGADERM 4X4.75 (GAUZE/BANDAGES/DRESSINGS) ×2 IMPLANT
ELECT REM PT RETURN 9FT ADLT (ELECTROSURGICAL) ×2
ELECTRODE REM PT RTRN 9FT ADLT (ELECTROSURGICAL) ×1 IMPLANT
FILTER SMOKE EVAC LAPAROSHD (FILTER) ×1 IMPLANT
GAUZE SPONGE 2X2 8PLY STRL LF (GAUZE/BANDAGES/DRESSINGS) ×1 IMPLANT
GLOVE BIO SURGEON STRL SZ 6.5 (GLOVE) ×2 IMPLANT
GLOVE BIO SURGEON STRL SZ7 (GLOVE) ×2 IMPLANT
GLOVE BIO SURGEON STRL SZ7.5 (GLOVE) ×1 IMPLANT
GLOVE BIO SURGEON STRL SZ8 (GLOVE) ×1 IMPLANT
GLOVE BIOGEL PI IND STRL 7.5 (GLOVE) ×1 IMPLANT
GLOVE BIOGEL PI INDICATOR 7.5 (GLOVE) ×2
GLOVE ECLIPSE 6.5 STRL STRAW (GLOVE) ×1 IMPLANT
GOWN PREVENTION PLUS XLARGE (GOWN DISPOSABLE) ×1 IMPLANT
GOWN STRL NON-REIN LRG LVL3 (GOWN DISPOSABLE) ×8 IMPLANT
KIT BASIN OR (CUSTOM PROCEDURE TRAY) ×2 IMPLANT
KIT ROOM TURNOVER OR (KITS) ×2 IMPLANT
NS IRRIG 1000ML POUR BTL (IV SOLUTION) ×2 IMPLANT
PAD ARMBOARD 7.5X6 YLW CONV (MISCELLANEOUS) ×2 IMPLANT
POUCH SPECIMEN RETRIEVAL 10MM (ENDOMECHANICALS) ×2 IMPLANT
SCISSORS LAP 5X35 DISP (ENDOMECHANICALS) IMPLANT
SET CHOLANGIOGRAPH 5 50 .035 (SET/KITS/TRAYS/PACK) ×2 IMPLANT
SET IRRIG TUBING LAPAROSCOPIC (IRRIGATION / IRRIGATOR) ×2 IMPLANT
SLEEVE ENDOPATH XCEL 5M (ENDOMECHANICALS) ×2 IMPLANT
SPECIMEN JAR SMALL (MISCELLANEOUS) ×2 IMPLANT
SPONGE GAUZE 2X2 STER 10/PKG (GAUZE/BANDAGES/DRESSINGS) ×1
STERI STRIP BROWN 1/4X3 R155 (GAUZE/BANDAGES/DRESSINGS) ×1 IMPLANT
SUT MNCRL AB 4-0 PS2 18 (SUTURE) ×2 IMPLANT
TOWEL OR 17X24 6PK STRL BLUE (TOWEL DISPOSABLE) ×2 IMPLANT
TOWEL OR 17X26 10 PK STRL BLUE (TOWEL DISPOSABLE) ×2 IMPLANT
TRAY LAPAROSCOPIC (CUSTOM PROCEDURE TRAY) ×2 IMPLANT
TROCAR XCEL BLUNT TIP 100MML (ENDOMECHANICALS) ×2 IMPLANT
TROCAR XCEL NON-BLD 11X100MML (ENDOMECHANICALS) ×2 IMPLANT
TROCAR XCEL NON-BLD 5MMX100MML (ENDOMECHANICALS) ×2 IMPLANT

## 2011-11-28 NOTE — Preoperative (Signed)
Beta Blockers   Reason not to administer Beta Blockers:Not Applicable 

## 2011-11-28 NOTE — H&P (Signed)
Chief Complaint   Patient presents with   .  New Evaluation       eval of GB         HPI Susan Hopkins is a 40 y.o. female.  Referred by Dr. 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 She has had known gallstones for at least 8 years. She has had several severe episodes. These episodes include severe right upper quadrant abdominal pain nausea vomiting and diarrhea. Recently, she has noticed more frequent but less severe symptoms. She has frequent right upper quadrant pain radiating up to her right shoulder. This is associated with a lot of bloating and nausea. She denies any change in her bowel habits. She also has had some skin itching which may or may not be related to this problem. She has not had a repeat ultrasound or blood work. She did have a severe episode about 2 years ago when she was pregnant. She currently is not pregnant. Past Medical History   Diagnosis  Date   .  ADVANCED MATERNAL AGE  78/02/2008   .  DIABETES MELLITUS, GESTATIONAL, HX OF  05/18/2008   .  HYPEREMESIS GRAVIDARUM  04/29/2008   .  Contact dermatitis  08/27/2011   .  ANEMIA, IRON DEFICIENCY, UNSPEC.  02/12/2007   .  DYSFUNCTIONAL UTERINE BLEEDING  06/11/2010   .  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  GERD (gastroesophageal reflux disease)     .  Migraine headache           Past Surgical History   Procedure  Date   .  Cesarean section     .  Female circumcision     .  Tonsillectomy           Family History   Problem  Relation  Age of Onset   .  Diabetes  Mother     .  Hypertension  Mother     .  Hypertension  Father          Social History History   Substance Use Topics   .  Smoking status:  Never Smoker    .  Smokeless tobacco:  Never Used   .  Alcohol Use:  No        No Known Allergies    Current Outpatient Prescriptions   Medication  Sig  Dispense  Refill     .  PRENATAL VITAMINS PO  Take by mouth daily.                Review of Systems Review of Systems  Constitutional: Negative for fever, chills and unexpected weight change.  HENT: Negative for hearing loss, congestion, sore throat, trouble swallowing and voice change.   Eyes: Negative for visual disturbance.  Respiratory: Negative for cough and wheezing.   Cardiovascular: Negative for chest pain, palpitations and leg swelling.  Gastrointestinal: Positive for nausea, abdominal pain and abdominal distention. Negative for vomiting, diarrhea, constipation, blood in stool and anal bleeding.  Genitourinary: Negative for hematuria, vaginal bleeding and difficulty urinating.  Musculoskeletal: Negative for arthralgias.  Skin: Negative for rash and wound.  Neurological: Positive for headaches. Negative for seizures and syncope.  Hematological: Negative for adenopathy. Does not bruise/bleed easily.  Psychiatric/Behavioral: Negative for confusion.      Blood pressure 124/84, pulse 60, temperature 97 F (36.1 C), temperature source Temporal, resp. rate 16, height 5'  6" (1.676 m), weight 177 lb 8 oz (80.513 kg), last menstrual period 11/06/2011.   Physical Exam WDWN in NAD HEENT:  EOMI, sclera anicteric Neck:  No masses, no thyromegaly Lungs:  CTA bilaterally; normal respiratory effort CV:  Regular rate and rhythm; no murmurs Abd:  +bowel sounds, soft, mild RUQ tenderness, no masses Ext:  Well-perfused; no edema Skin:  Warm, dry; no sign of jaundice    Data Reviewed Ultrasound from 2004 - multiple small shadowing gallstones, but no wall thickening.   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  I discussed the procedure in detail.  The patient was given Agricultural engineer.  We discussed the risks and benefits of a laparoscopic cholecystectomy and possible cholangiogram  including, but not limited to bleeding, infection, injury to surrounding structures such as the intestine or liver, bile leak, retained gallstones, need to convert to an open procedure, prolonged diarrhea, blood clots such as  DVT, common bile duct injury, anesthesia risks, and possible need for additional procedures.  The likelihood of improvement in symptoms and return to the patient's normal status is good. We discussed the typical post-operative recovery course         Wilmon Arms. Corliss Skains, MD, [facility]  11/28/2011 7:35 AM

## 2011-11-28 NOTE — Anesthesia Preprocedure Evaluation (Signed)
Anesthesia Evaluation  Patient identified by MRN, date of birth, ID band Patient awake    Reviewed: Allergy & Precautions, H&P , NPO status , Patient's Chart, lab work & pertinent test results  Airway Mallampati: II  Neck ROM: full    Dental   Pulmonary          Cardiovascular     Neuro/Psych  Headaches,    GI/Hepatic PUD, GERD-  ,  Endo/Other    Renal/GU      Musculoskeletal   Abdominal   Peds  Hematology   Anesthesia Other Findings   Reproductive/Obstetrics                           Anesthesia Physical Anesthesia Plan  ASA: II  Anesthesia Plan: General   Post-op Pain Management:    Induction: Intravenous  Airway Management Planned: Oral ETT  Additional Equipment:   Intra-op Plan:   Post-operative Plan:   Informed Consent: I have reviewed the patients History and Physical, chart, labs and discussed the procedure including the risks, benefits and alternatives for the proposed anesthesia with the patient or authorized representative who has indicated his/her understanding and acceptance.     Plan Discussed with: CRNA and Surgeon  Anesthesia Plan Comments:         Anesthesia Quick Evaluation

## 2011-11-28 NOTE — Op Note (Signed)
Laparoscopic Cholecystectomy with IOC Procedure Note  Indications: This patient presents with symptomatic gallbladder disease and will undergo laparoscopic cholecystectomy.  Pre-operative Diagnosis: Calculus of gallbladder with other cholecystitis, without mention of obstruction  Post-operative Diagnosis: Same  Surgeon: Oyindamola Key K.   Assistants: Violeta Gelinas  Anesthesia: General endotracheal anesthesia  ASA Class: 2  Procedure Details  The patient was seen again in the Holding Room. The risks, benefits, complications, treatment options, and expected outcomes were discussed with the patient. The possibilities of reaction to medication, pulmonary aspiration, perforation of viscus, bleeding, recurrent infection, finding a normal gallbladder, the need for additional procedures, failure to diagnose a condition, the possible need to convert to an open procedure, and creating a complication requiring transfusion or operation were discussed with the patient. The likelihood of improving the patient's symptoms with return to their baseline status is good.  The patient and/or family concurred with the proposed plan, giving informed consent. The site of surgery properly noted. The patient was taken to Operating Room, identified as Arlyn Dunning and the procedure verified as Laparoscopic Cholecystectomy with Intraoperative Cholangiogram. A Time Out was held and the above information confirmed.  Prior to the induction of general anesthesia, antibiotic prophylaxis was administered. General endotracheal anesthesia was then administered and tolerated well. After the induction, the abdomen was prepped with Chloraprep and draped in the sterile fashion. The patient was positioned in the supine position.  Local anesthetic agent was injected into the skin near the umbilicus and an incision made. We dissected down to the abdominal fascia with blunt dissection.  The fascia was incised vertically and we  entered the peritoneal cavity bluntly.  A pursestring suture of 0-Vicryl was placed around the fascial opening.  The Hasson cannula was inserted and secured with the stay suture.  Pneumoperitoneum was then created with CO2 and tolerated well without any adverse changes in the patient's vital signs. An 11-mm port was placed in the subxiphoid position.  Two 5-mm ports were placed in the right upper quadrant. All skin incisions were infiltrated with a local anesthetic agent before making the incision and placing the trocars.   We positioned the patient in reverse Trendelenburg, tilted slightly to the patient's left.  There were significant omental adhesions to the fundus of the gallbladder.  We took these down with cautery and blunt dissection. The gallbladder was identified, the fundus grasped and retracted cephalad. Adhesions were lysed bluntly and with the electrocautery where indicated, taking care not to injure any adjacent organs or viscus. The infundibulum was grasped and retracted laterally, exposing the peritoneum overlying the triangle of Calot. This was then divided and exposed in a blunt fashion. A critical view of the cystic duct and cystic artery was obtained.  The cystic duct was clearly identified and bluntly dissected circumferentially. The cystic duct was ligated with a clip distally.   An incision was made in the cystic duct and the [facility] cholangiogram catheter introduced. The catheter was secured using a clip. A cholangiogram was then obtained which showed good visualization of the distal and proximal biliary tree with no sign of filling defects or obstruction.  Contrast flowed easily into the duodenum. The catheter was then removed.   The cystic duct was then ligated with clips and divided. The cystic artery was identified, dissected free, ligated with clips and divided as well.   The gallbladder was dissected from the liver bed in retrograde fashion with the electrocautery. The gallbladder  was removed and placed in an Endocatch sac. The  liver bed was irrigated and inspected. Hemostasis was achieved with the electrocautery. Copious irrigation was utilized and was repeatedly aspirated until clear.  The gallbladder and Endocatch sac were then removed through the umbilical port site.  The pursestring suture was used to close the umbilical fascia.  An additional suture of 0 Vicryl was used to finish closing the umbilical fascia.  We again inspected the right upper quadrant for hemostasis.  Pneumoperitoneum was released as we removed the trocars.  4-0 Monocryl was used to close the skin.   Benzoin, steri-strips, and clean dressings were applied. The patient was then extubated and brought to the recovery room in stable condition. Instrument, sponge, and needle counts were correct at closure and at the conclusion of the case.   Findings: Cholecystitis with Cholelithiasis  Estimated Blood Loss: Minimal         Drains: none         Specimens: Gallbladder           Complications: None; patient tolerated the procedure well.         Disposition: PACU - hemodynamically stable.         Condition: stable

## 2011-11-28 NOTE — Transfer of Care (Signed)
Immediate Anesthesia Transfer of Care Note  Patient: Susan Hopkins  Procedure(s) Performed:  LAPAROSCOPIC CHOLECYSTECTOMY WITH INTRAOPERATIVE CHOLANGIOGRAM  Patient Location: PACU  Anesthesia Type: General  Level of Consciousness: awake  Airway & Oxygen Therapy: Patient Spontanous Breathing and Patient connected to face mask oxygen  Post-op Assessment: Report given to PACU RN and Post -op Vital signs reviewed and stable  Post vital signs: Reviewed and stable  Complications: No apparent anesthesia complications

## 2011-11-28 NOTE — Anesthesia Procedure Notes (Signed)
Procedure Name: Intubation Date/Time: 11/28/2011 10:36 AM Performed by: Gwenyth Allegra Pre-anesthesia Checklist: Timeout performed, Emergency Drugs available, Suction available and Patient being monitored Patient Re-evaluated:Patient Re-evaluated prior to inductionOxygen Delivery Method: Circle System Utilized Preoxygenation: Pre-oxygenation with 100% oxygen Intubation Type: IV induction Ventilation: Mask ventilation without difficulty Laryngoscope Size: 3 Grade View: Grade II Tube type: Oral Tube size: 7.0 mm Airway Equipment and Method: stylet Placement Confirmation: ETT inserted through vocal cords under direct vision,  positive ETCO2 and breath sounds checked- equal and bilateral Secured at: 21 cm Tube secured with: Tape Dental Injury: Teeth and Oropharynx as per pre-operative assessment

## 2011-11-28 NOTE — Anesthesia Postprocedure Evaluation (Signed)
Anesthesia Post Note  Patient: Susan Hopkins  Procedure(s) Performed:  LAPAROSCOPIC CHOLECYSTECTOMY WITH INTRAOPERATIVE CHOLANGIOGRAM  Anesthesia type: General  Patient location: PACU  Post pain: Pain level controlled and Adequate analgesia  Post assessment: Post-op Vital signs reviewed, Patient's Cardiovascular Status Stable, Respiratory Function Stable, Patent Airway and Pain level controlled  Last Vitals:  Filed Vitals:   11/28/11 1230  BP:   Pulse: 67  Temp:   Resp: 14    Post vital signs: Reviewed and stable  Level of consciousness: awake, alert  and oriented  Complications: No apparent anesthesia complications

## 2011-11-29 ENCOUNTER — Telehealth (INDEPENDENT_AMBULATORY_CARE_PROVIDER_SITE_OTHER): Payer: Self-pay | Admitting: General Surgery

## 2011-11-29 ENCOUNTER — Encounter (HOSPITAL_COMMUNITY): Payer: Self-pay | Admitting: Surgery

## 2011-11-29 NOTE — Telephone Encounter (Signed)
She called and was unsure what she can eat following her cholecystectomy which was done yesterday.  I instructed her to stay on liquids with some crackers and toast today and tomorrow and start a low fat diet on Sunday.

## 2011-12-05 ENCOUNTER — Ambulatory Visit
Admission: RE | Admit: 2011-12-05 | Discharge: 2011-12-05 | Disposition: A | Discharge status: Home / Self Care | Payer: Self-pay | Source: Ambulatory Visit | Attending: Obstetrics and Gynecology | Admitting: Obstetrics and Gynecology

## 2011-12-05 ENCOUNTER — Other Ambulatory Visit: Payer: Self-pay | Admitting: Obstetrics and Gynecology

## 2011-12-05 DIAGNOSIS — R928 Other abnormal and inconclusive findings on diagnostic imaging of breast: Secondary | ICD-10-CM

## 2011-12-12 ENCOUNTER — Telehealth: Payer: Self-pay

## 2011-12-12 NOTE — Telephone Encounter (Signed)
Message copied by Faythe Casa on Thu Dec 12, 2011  8:38 AM ------      Message from: Elwin Sleight A      Created: Wed Dec 11, 2011  1:52 PM                   ----- Message -----         From: Caren Griffins, CNM         Sent: 12/07/2011   9:27 AM           To: [facility] Admin Pool            Needs F/U mammo in 6 months

## 2011-12-12 NOTE — Telephone Encounter (Signed)
Called and informed pt that she was recommended to schedule a follow up on her mammogram in 6 months (around June of next year).  Pt informed me that she had the number to be able to schedule the appt and had no further questions. Pt stated understanding.

## 2011-12-24 ENCOUNTER — Encounter (INDEPENDENT_AMBULATORY_CARE_PROVIDER_SITE_OTHER): Payer: Self-pay | Admitting: Surgery

## 2011-12-26 ENCOUNTER — Encounter (INDEPENDENT_AMBULATORY_CARE_PROVIDER_SITE_OTHER): Payer: Self-pay | Admitting: Surgery

## 2011-12-26 ENCOUNTER — Ambulatory Visit (INDEPENDENT_AMBULATORY_CARE_PROVIDER_SITE_OTHER): Payer: PRIVATE HEALTH INSURANCE | Admitting: Surgery

## 2011-12-26 VITALS — BP 116/74 | HR 68 | Temp 97.8°F | Resp 16 | Ht 66.0 in | Wt 177.0 lb

## 2011-12-26 DIAGNOSIS — K8020 Calculus of gallbladder without cholecystitis without obstruction: Secondary | ICD-10-CM

## 2011-12-26 NOTE — Progress Notes (Signed)
This patient is status post laparoscopic cholecystectomy with intraoperative cholangiogram.  The pathology report showed chronic cholecystitis.  The patient reports that the post-operative pain has resolved.  They have resumed a regular diet without problems and are having regular bowel movements.  On physical examination, all of the incisions are healing well with no signs of infection or bleeding.  Steri-strips have all been removed.  Impression:  The patient is doing well after laparoscopic cholecystectomy for chronic cholecystitis.  Plan:  The patient may resume full activity and regular diet.  They may follow-up with [place] on a PRN basis.    Wilmon Arms. Corliss Skains, MD, [facility]  12/26/2011 9:19 AM

## 2012-03-13 ENCOUNTER — Telehealth: Payer: Self-pay | Admitting: Family Medicine

## 2012-03-13 MED ORDER — CYCLOBENZAPRINE HCL 10 MG PO TABS: 10.0000 mg | ORAL_TABLET | Freq: Three times a day (TID) | ORAL | Status: AC | PRN

## 2012-03-13 MED ORDER — IBUPROFEN 800 MG PO TABS: 800.0000 mg | ORAL_TABLET | Freq: Three times a day (TID) | ORAL | Status: AC | PRN

## 2012-03-13 MED ORDER — SUMATRIPTAN SUCCINATE 50 MG PO TABS: 50.0000 mg | ORAL_TABLET | ORAL | Status: DC | PRN

## 2012-03-13 NOTE — Telephone Encounter (Signed)
Patient calling because of migraine headache. She is out of her medications. I have refilled for one month her imitrex/ibuprofen 800/flexeril

## 2012-06-10 ENCOUNTER — Ambulatory Visit (INDEPENDENT_AMBULATORY_CARE_PROVIDER_SITE_OTHER): Payer: Self-pay | Admitting: Family Medicine

## 2012-06-10 ENCOUNTER — Encounter: Payer: Self-pay | Admitting: Family Medicine

## 2012-06-10 VITALS — BP 136/81 | HR 99 | Temp 98.6°F | Ht 66.0 in | Wt 171.0 lb

## 2012-06-10 DIAGNOSIS — N912 Amenorrhea, unspecified: Secondary | ICD-10-CM

## 2012-06-10 DIAGNOSIS — Z3490 Encounter for supervision of normal pregnancy, unspecified, unspecified trimester: Secondary | ICD-10-CM | POA: Insufficient documentation

## 2012-06-10 DIAGNOSIS — Z331 Pregnant state, incidental: Secondary | ICD-10-CM

## 2012-06-10 MED ORDER — FOLIC ACID 800 MCG PO TABS: 800.0000 ug | ORAL_TABLET | Freq: Every day | ORAL | Status: DC

## 2012-06-10 MED ORDER — ONDANSETRON HCL 4 MG PO TABS: 4.0000 mg | ORAL_TABLET | Freq: Three times a day (TID) | ORAL | Status: DC | PRN

## 2012-06-10 NOTE — Patient Instructions (Addendum)
I am so excited for you today !  I would like you to try lemon heads or other sour candy Please try to keep a little bit of food on her stomach to help with the nausea I have written you for Zofran for nausea I have also written you for folic acid which should be a smaller pill If you would prefer, they have over-the-counter prenatal gummy vitamins which you may be able to take   Please get your pregnancy Medicaid and call us back so that we can schedule your new OB appointment After that we will send you over to [facility] hospital for your pregnancy

## 2012-06-11 ENCOUNTER — Encounter: Payer: Self-pay | Admitting: Family Medicine

## 2012-06-11 NOTE — Progress Notes (Signed)
  Subjective:    Patient ID: Susan Hopkins, female    DOB: January 22, 1971, 41 y.o.   MRN: [id number]  HPI  Patient presents today with nausea, vomiting, indigestion for the last week. Her last menstrual period was 05/02/2012. She thinks she is pregnant. Her last pregnancy was 3 years ago. She is a G5 P3. She is excited to be pregnant again. She feels like she is having more nausea this time around. In her last pregnancy she is Zofran and Zantac for her nausea. She's having trouble keeping down her prenatal vitamin. She's also having lots of salivation. She denies vaginal bleeding, abdominal pain.  Review of Systems See above    Objective:   Physical Exam Vital signs reviewed General appearance - alert, well appearing, and in no distress        Assessment & Plan:

## 2012-06-11 NOTE — Assessment & Plan Note (Signed)
Patient prescribed Zofran for nausea. Also prescribed folic acid since she is having trouble keeping down her prenatal vitamin. Advised hour and salt for her extra salivation. She will likely need to be sent to high risk clinic and that if the patient prefers. After she gets her pregnancy Medicaid, she will come in for labs and new visit.

## 2012-06-16 ENCOUNTER — Encounter: Payer: Self-pay | Admitting: Family Medicine

## 2012-06-16 ENCOUNTER — Ambulatory Visit (INDEPENDENT_AMBULATORY_CARE_PROVIDER_SITE_OTHER): Payer: Self-pay | Admitting: Family Medicine

## 2012-06-16 VITALS — BP 117/77 | HR 92 | Temp 98.5°F | Ht 66.0 in | Wt 170.0 lb

## 2012-06-16 DIAGNOSIS — Z331 Pregnant state, incidental: Secondary | ICD-10-CM

## 2012-06-16 DIAGNOSIS — Z3490 Encounter for supervision of normal pregnancy, unspecified, unspecified trimester: Secondary | ICD-10-CM

## 2012-06-16 NOTE — Patient Instructions (Addendum)
Nice to meet you today.   We discussed your nausea and vomiting, difficulty sleeping, and excess salivation. For your nausea and vomiting I am recommending Doxalamine and vitamin B6. Both of which you can buy over the counter.  Doxalamine 1/2 a 25 mg tablet three times per day to help with nausea (will also help with sleep)  Vitamin B6 25 mg tablet three times per day For your excess salivation I would like you to use ginger candies or ginger ale, this should help minimize your salivation. We would like you to establish prenatal care in the near future as well. This could be with the family practice clinic or an OB/GYN of your choice. Thanks you for your visit today.  Marikay Alar, MD

## 2012-06-16 NOTE — Progress Notes (Signed)
  Subjective:    Patient ID: Susan Hopkins, female    DOB: 1971/01/05, 41 y.o.   MRN: [id number]  HPI Susan Hopkins is a 41 yo female who presents for nausea and vomiting and difficulty sleeping.  She recently had a positive pregnancy test in the office.  She is at [redacted] weeks EGA.  She's had increased nausea and vomiting over the past week.  States nothing makes her nausea better.  She vomits almost every time she eats and noticed the certain smells make her more nauseated.  She has tried zofran to help with this, but this has not helped her.  She is able to keep water down, but no other liquids and is able to keep 1 meal per day down.  She does not feel as though she is dehydrated.  She also complains of increased salivation.  Her difficulty sleeping started 5 days ago.  She states she feel sleepy, but can't fall asleep.  States she sleeps for 10 minutes at a time typically and then wakes up.  She is not napping during the day.  Says this occurred with one of her previous pregnancies.    Review of Systems complains of mild indigestion, otherwise negative except per HPI     Objective:   Physical Exam  Gen: alert, sitting comfortably in chair CV: regular rate and rhythm Pulm: clear to auscultation bilaterally Abd: +BS, soft, non-distended, mildly tender epigastrium ENT: moist mucus membranes       Assessment & Plan:

## 2012-06-16 NOTE — Assessment & Plan Note (Signed)
Patient likely has morning sickness of pregnancy given history of nausea and vomiting in the first trimester. Recommended that the patient start Daxalamine 1/2 25 mg tablet TID and vitamin B6 25 mg TID for her nausea and vomiting. Doxalamine will also help with her sleep issues. For her increased salivation we recommended ginger ale and ginger hard candies to help with this. We also discussed her need to start prenatal care and sent a message to Terese Door to set up an appointment.  The patient is amenable to setting up care through the family practice center. Patient was advised to return if her nausea and vomiting becomes acutely worse and she is unable to hold any food or liquids down.

## 2012-06-17 NOTE — Progress Notes (Signed)
doxylamine

## 2012-06-29 ENCOUNTER — Other Ambulatory Visit: Payer: Self-pay

## 2012-06-29 DIAGNOSIS — Z3480 Encounter for supervision of other normal pregnancy, unspecified trimester: Secondary | ICD-10-CM

## 2012-06-29 NOTE — Progress Notes (Signed)
PRENATAL LABS DONE TODAY Jeanclaude Wentworth 

## 2012-06-30 LAB — OBSTETRIC PANEL
Antibody Screen: NEGATIVE
Basophils Absolute: 0 10*3/uL (ref 0.0–0.1)
Basophils Relative: 0 % (ref 0–1)
Eosinophils Relative: 1 % (ref 0–5)
HCT: 35.9 % — ABNORMAL LOW (ref 36.0–46.0)
Hemoglobin: 11.5 g/dL — ABNORMAL LOW (ref 12.0–15.0)
Lymphocytes Relative: 36 % (ref 12–46)
MCHC: 32 g/dL (ref 30.0–36.0)
MCV: 74.5 fL — ABNORMAL LOW (ref 78.0–100.0)
Monocytes Absolute: 0.4 10*3/uL (ref 0.1–1.0)
Monocytes Relative: 8 % (ref 3–12)
RDW: 16.2 % — ABNORMAL HIGH (ref 11.5–15.5)
Rh Type: NEGATIVE
Rubella: 292.1 IU/mL — ABNORMAL HIGH

## 2012-06-30 LAB — HIV ANTIBODY (ROUTINE TESTING W REFLEX): HIV: NONREACTIVE

## 2012-07-01 LAB — CULTURE, OB URINE: Organism ID, Bacteria: NO GROWTH

## 2012-07-06 ENCOUNTER — Other Ambulatory Visit (HOSPITAL_COMMUNITY)
Admission: RE | Admit: 2012-07-06 | Discharge: 2012-07-06 | Disposition: A | Discharge status: Home / Self Care | Payer: Medicaid Other | Source: Ambulatory Visit | Attending: Family Medicine | Admitting: Family Medicine

## 2012-07-06 ENCOUNTER — Ambulatory Visit (INDEPENDENT_AMBULATORY_CARE_PROVIDER_SITE_OTHER): Payer: Self-pay | Admitting: Family Medicine

## 2012-07-06 ENCOUNTER — Encounter: Payer: Self-pay | Admitting: Family Medicine

## 2012-07-06 VITALS — BP 114/76 | Wt 173.0 lb

## 2012-07-06 DIAGNOSIS — O210 Mild hyperemesis gravidarum: Secondary | ICD-10-CM

## 2012-07-06 DIAGNOSIS — Z3490 Encounter for supervision of normal pregnancy, unspecified, unspecified trimester: Secondary | ICD-10-CM

## 2012-07-06 DIAGNOSIS — Z113 Encounter for screening for infections with a predominantly sexual mode of transmission: Secondary | ICD-10-CM | POA: Insufficient documentation

## 2012-07-06 DIAGNOSIS — G43909 Migraine, unspecified, not intractable, without status migrainosus: Secondary | ICD-10-CM

## 2012-07-06 DIAGNOSIS — Z331 Pregnant state, incidental: Secondary | ICD-10-CM

## 2012-07-06 MED ORDER — FOLIC ACID 800 MCG PO TABS: 400.0000 ug | ORAL_TABLET | Freq: Every day | ORAL | Status: DC

## 2012-07-06 MED ORDER — ONDANSETRON HCL 4 MG PO TABS: 4.0000 mg | ORAL_TABLET | Freq: Three times a day (TID) | ORAL | Status: AC | PRN

## 2012-07-06 NOTE — Patient Instructions (Signed)
Dear Susan Hopkins,   It was great to see you today. Thank you for coming to clinic. Congratulations on your pregnancy!   1. I am going to refill your folic acid and send it to your pharmacy. I am also going to send in your Zofran.  2. We will refer you to maternal fetal medicine for genetics counseling.  3. We will schedule you for an early glucola test for diabetes. Schedule this at the front desk for 1 week.  4. We are going to check your blood group again today and a thyroid test.   Please follow up in clinic in 4 weeks . Please call earlier if you have any questions or concerns.   Sincerely,  Dr. Tana Conch

## 2012-07-07 NOTE — Progress Notes (Signed)
Subjective:    Susan Hopkins is being seen today for her first obstetrical visit.  This is a planned pregnancy. She is at [redacted]w[redacted]d gestation. Her obstetrical history is significant for advanced maternal age and history GDM. Relationship with FOB: spouse, living together. Patient does intend to breast feed. Pregnancy history fully reviewed.  Patient with continued nausea aand vomiting though improved at this time. Vomiting down to 3-4x pe day from 5-7 using doxylamine, vitamin b6, ginger ale, and zofran. Out of zofran today and asks for refill. No bleeding. In review of history, patient states that she does not remember being o negative and records show last pregnancy o+.   Menstrual History: OB History    Grav Para Term Preterm Abortions TAB SAB Ect Mult Living   5 3 3  1  1          Patient's last menstrual period was 05/02/2012.    The following portions of the patient's history were reviewed and updated as appropriate: allergies, current medications, past family history, past medical history, past social history, past surgical history and problem list.  Review of Systems Pertinent items are noted in HPI.    Objective:    BP 114/76  Wt 173 lb (78.472 kg)  LMP 05/02/2012 General appearance: alert and cooperative Head: Normocephalic, without obvious abnormality, atraumatic Eyes: conjunctivae/corneas clear. PERRL, EOM's intact. Fundi benign. Ears: normal TM's and external ear canals both ears Throat: lips, mucosa, and tongue normal; teeth and gums normal Neck: no adenopathy, supple, symmetrical, trachea midline and thyroid not enlarged, symmetric, no tenderness/mass/nodules Lungs: clear to auscultation bilaterally Heart: regular rate and rhythm, S1, S2 normal, no murmur, click, rub or gallop Abdomen: soft, non-tender; bowel sounds normal; no masses,  no organomegaly Pelvic: cervix normal in appearance, no adnexal masses or tenderness, no cervical motion tenderness, vagina normal without  discharge and s/p female circumcision. Uterus approximately softball size.  Extremities: extremities normal, atraumatic, no cyanosis or edema and edema none Pulses: 2+ and symmetric Skin: Skin color, texture, turgor normal. No rashes or lesions Neurologic: Grossly normal    Assessment:    Pregnancy at 9 and 2/7 weeks    Plan:    Initial labs reviewed.  O negative noted but previously O positive, will replete blood type at this time Prenatal vitamins--> folic acid only as vomiting consistently with PNV Hyperemesis Gravidarum - continue previous meds, refill zofran. No signs dehydration.  Pregnancy medical home completed. Score of 10 with 0 for #9. Patient says related to all of her vomiting.  Will refer to OB at 30-32 weeks for repeat c-section Early glucola ordered Problem list reviewed and updated. Patient requests MFM counseling but without medicaid at this time. Will refer for genetics portion only. May be integrated screen out of pocket OR if gets medicaid. Otherwise, consider quad screen Role of ultrasound in pregnancy discussed; possible integrated screen, otherwise anatomy scan only Amniocentesis discussed: declined. Follow up in 4 weeks.

## 2012-07-08 LAB — ABO AND RH: Rh Type: NEGATIVE

## 2012-07-09 ENCOUNTER — Telehealth: Payer: Self-pay | Admitting: Family Medicine

## 2012-07-09 NOTE — Telephone Encounter (Signed)
EMERGENCY LINE CALL: Has history of migraine headaches and feels like she is getting a headache now; does not actually have a headache at this time.  Was told by PCP she cannot take her regular migraine medication and is asking what she can take.  Is able to take po, no vomiting, feels hydrated.  Advised pt to take Tylenol.  [facility]. Pt stated understanding.

## 2012-07-13 ENCOUNTER — Other Ambulatory Visit: Payer: Self-pay

## 2012-07-13 LAB — GLUCOSE, CAPILLARY
Comment 1: 1
Glucose-Capillary: 202 mg/dL — ABNORMAL HIGH (ref 70–99)

## 2012-07-13 NOTE — Progress Notes (Signed)
1 hr OB glucose = 202 mg/dL; No 3 hr gtt scheduled since glucose >190 mg/dL; Dewitt Hoes, MLS

## 2012-07-13 NOTE — Progress Notes (Signed)
Note reviewed.  Agree with plan.  Following issues noted: AMA - Pt interested in genetic screening, but limited financial resources.  Referral done to genetics.  Could get Quad screen at [facility] at no cost to pt. Diabetes - Early glucola done today (07/13/12) and is 202.  Will refer to high risk.  Will ask pt to return to [facility] for CMP, 24 hour urine collection and A1C.   ABO blood type - Previously Rh positive, never received Rhogam in past.  Now noted to be Rh negative due to differences in reporting through the lab. Rhogam seems to be indicated now.  OB service will be managing pt given her diabetes, so will defer to them. H/o section x 2-Desires repeat.  Will be followed by [facility] service who will manage her delivery planning. Hyperemesis - improved with Zofran.  Continue.

## 2012-07-15 ENCOUNTER — Encounter (HOSPITAL_COMMUNITY): Payer: Self-pay | Admitting: *Deleted

## 2012-07-15 ENCOUNTER — Inpatient Hospital Stay (HOSPITAL_COMMUNITY)
Admission: AD | Admit: 2012-07-15 | Discharge: 2012-07-15 | Disposition: A | Discharge status: Home / Self Care | Payer: Medicaid Other | Source: Ambulatory Visit | Attending: Obstetrics & Gynecology | Admitting: Obstetrics & Gynecology

## 2012-07-15 DIAGNOSIS — G43009 Migraine without aura, not intractable, without status migrainosus: Secondary | ICD-10-CM

## 2012-07-15 DIAGNOSIS — Z331 Pregnant state, incidental: Secondary | ICD-10-CM

## 2012-07-15 DIAGNOSIS — O99891 Other specified diseases and conditions complicating pregnancy: Secondary | ICD-10-CM | POA: Insufficient documentation

## 2012-07-15 DIAGNOSIS — O219 Vomiting of pregnancy, unspecified: Secondary | ICD-10-CM

## 2012-07-15 LAB — URINALYSIS, ROUTINE W REFLEX MICROSCOPIC
Bilirubin Urine: NEGATIVE
Ketones, ur: NEGATIVE mg/dL
Nitrite: NEGATIVE
Urobilinogen, UA: 0.2 mg/dL (ref 0.0–1.0)

## 2012-07-15 MED ORDER — DIPHENHYDRAMINE HCL 50 MG/ML IJ SOLN
25.0000 mg | Freq: Once | INTRAMUSCULAR | Status: AC
  Administered 2012-07-15: 25 mg via INTRAVENOUS
  Filled 2012-07-15: qty 1

## 2012-07-15 MED ORDER — PROCHLORPERAZINE EDISYLATE 5 MG/ML IJ SOLN
10.0000 mg | Freq: Once | INTRAMUSCULAR | Status: AC
  Administered 2012-07-15: 10 mg via INTRAVENOUS
  Filled 2012-07-15: qty 2

## 2012-07-15 MED ORDER — BUTALBITAL-APAP-CAFFEINE 50-325-40 MG PO TABS: 1.0000 | ORAL_TABLET | Freq: Four times a day (QID) | ORAL | Status: DC | PRN

## 2012-07-15 MED ORDER — DEXAMETHASONE SODIUM PHOSPHATE 10 MG/ML IJ SOLN
10.0000 mg | Freq: Once | INTRAMUSCULAR | Status: AC
  Administered 2012-07-15: 10 mg via INTRAVENOUS
  Filled 2012-07-15: qty 1

## 2012-07-15 MED ORDER — LACTATED RINGERS IV BOLUS (SEPSIS)
1000.0000 mL | Freq: Once | INTRAVENOUS | Status: AC
  Administered 2012-07-15: 1000 mL via INTRAVENOUS

## 2012-07-15 NOTE — MAU Provider Note (Signed)
Susan Hopkins [id number] @[redacted]w[redacted]d  by LMP Chief Complaint  Patient presents with  . Migraine   First Provider Initiated Contact with Patient 07/15/12 0518    SUBJECTIVE HPI: Susan Hopkins is a 41 y.o. year old [id number] female at [redacted]w[redacted]d weeks gestation who presents to MAU reporting waking up w/ severe left frontal HA at 0330. Associated w/ nausea and photophobia. Took 650 mg Tylenol w/ no relief. Has Hx of Migraines, similar, but worse pain and no aura. Migraines have been right or left frontal in past. Denies fever, chills, congestion, neck pain or stiffness, weakness, difficulties w/ speech of gait. Normally takes Imitrex for Migraines, but is unable to due to pregnancy.   Past Medical History  Diagnosis Date  . ADVANCED MATERNAL AGE 10/18/2008  . DIABETES MELLITUS, GESTATIONAL, HX OF 05/18/2008  . 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 Contact dermatitis 08/27/2011  . 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 Migraine headache     last migraine in Sept 2012  . GERD (gastroesophageal reflux disease)     doesn't take anything for acid reflux  . Gastric ulcer     history of  . Hemorrhoids   . Constipation   . Diarrhea   . ANEMIA, IRON DEFICIENCY, UNSPEC. 02/12/2007    after IUD   Past Surgical History  Procedure Date  . Cesarean section 2005/2010  . Female circumcision 48  . Tonsillectomy 1989  . Dilation and curettage of uterus 2009  . Cholecystectomy 11/28/2011    Procedure: LAPAROSCOPIC CHOLECYSTECTOMY WITH INTRAOPERATIVE CHOLANGIOGRAM;  Surgeon: Wilmon Arms. Corliss Skains, MD;  Location: [facility] OR;  Service: General;  Laterality: N/A;   History   Social History  . Marital Status: Married    Spouse Name: N/A    Number of Children: N/A  . Years of Education: N/A   Occupational History  . Not on file.   Social History Main Topics  . Smoking status: Never Smoker   . Smokeless tobacco: Never Used  . Alcohol Use: No  . Drug Use: No  .  Sexually Active: Yes   Other Topics Concern  . Not on file   Social History Narrative  . No narrative on file   No current facility-administered medications on file prior to encounter.   Current Outpatient Prescriptions on File Prior to Encounter  Medication Sig Dispense Refill  . ranitidine (ZANTAC) 150 MG tablet Take 150 mg by mouth 2 (two) times daily.      Marland Kitchen doxylamine, Sleep, (UNISOM) 25 MG tablet Take 12.5 mg by mouth 3 (three) times daily.      . folic acid (FOLVITE) 800 MCG tablet Take 0.5 tablets (400 mcg total) by mouth daily.  30 tablet  11  . PRENATAL VITAMINS PO Take 1 tablet by mouth daily.       . Pyridoxine HCl (VITAMIN B-6) 25 MG tablet Take 12.5 mg by mouth 3 (three) times daily.       Allergies  Allergen Reactions  . Oxycodone Itching    All over the body   ROS: Pertinent items in HPI  OBJECTIVE Blood pressure 135/86, pulse 98, temperature 98.9 F (37.2 C), temperature source Oral, resp. rate 20, height 5\' 6"  (1.676 m), weight 79.379 kg (175 lb), last menstrual period 05/02/2012.  GENERAL: Well-developed, well-nourished female in moderate-severe distress, rocking, crying. Normal gait and speech.  HEENT: Normocephalic, no sinus or neck tenderness. Normal ROM. HEART: normal rate RESP: normal effort ABDOMEN: Soft, nontender EXTREMITIES: Nontender, no edema. Normal  and equal strength bilat.  NEURO: Alert and oriented SPECULUM EXAM: deferred. FHTs 169 per doppler  LAB RESULTS  Results for orders placed during the hospital encounter of 07/15/12 (from the past 24 hour(s))  URINALYSIS, ROUTINE W REFLEX MICROSCOPIC     Status: Normal   Collection Time   07/15/12  4:40 AM      Component Value Range   Color, Urine YELLOW  YELLOW   APPearance CLEAR  CLEAR   Specific Gravity, Urine 1.015  1.005 - 1.030   pH 7.0  5.0 - 8.0   Glucose, UA NEGATIVE  NEGATIVE mg/dL   Hgb urine dipstick NEGATIVE  NEGATIVE   Bilirubin Urine NEGATIVE  NEGATIVE   Ketones, ur NEGATIVE   NEGATIVE mg/dL   Protein, ur NEGATIVE  NEGATIVE mg/dL   Urobilinogen, UA 0.2  0.0 - 1.0 mg/dL   Nitrite NEGATIVE  NEGATIVE   Leukocytes, UA NEGATIVE  NEGATIVE   IMAGING  ED COURSE 0520: IV bolus, Compazine, Decadron, Benadryl migraine cocktail given.  0730: Pain 1-2/10. Pt smiling.   ASSESSMENT 1. Migraine headache without aura   2. Pregnancy, incidental     PLAN D/ C home Go to ED for severe HA w/ fever, neck stiffness, weakness, difficulty w/ speech or gait.  Follow-up Information    [facility] (as scheduled)    Contact information:   [address] [place] [address]       Follow up with [facility]. (As needed if symptoms worsen)    Contact information:   [address] [place] [address] [phone]        Medication List  As of 07/15/2012  8:02 AM   TAKE these medications         acetaminophen 325 MG tablet   Commonly known as: TYLENOL   Take 650 mg by mouth every 6 (six) hours as needed.      butalbital-acetaminophen-caffeine 50-325-40 MG per tablet   Commonly known as: FIORICET, ESGIC   Take 1-2 tablets by mouth every 6 (six) hours as needed for headache.      doxylamine (Sleep) 25 MG tablet   Commonly known as: UNISOM   Take 12.5 mg by mouth 3 (three) times daily.      folic acid 800 MCG tablet   Commonly known as: FOLVITE   Take 400 mcg by mouth daily.      IRON PO   Take 1 tablet by mouth daily.      ondansetron 4 MG tablet   Commonly known as: ZOFRAN   Take 4 mg by mouth every 8 (eight) hours as needed.      PRENATAL VITAMINS PO   Take 1 tablet by mouth daily.      ranitidine 150 MG tablet   Commonly known as: ZANTAC   Take 150 mg by mouth 2 (two) times daily.      vitamin B-6 25 MG tablet   Take 12.5 mg by mouth 3 (three) times daily.           Susan Hopkins 07/15/2012 5:19 AM

## 2012-07-15 NOTE — MAU Note (Signed)
Patient states she woke up around 350 with a headache on the left side. Nausea no vomiting.

## 2012-07-16 NOTE — MAU Provider Note (Signed)
Attestation of Attending Supervision of Advanced Practitioner (CNM/NP): Evaluation and management procedures were performed by the Advanced Practitioner under my supervision and collaboration.  I have reviewed the Advanced Practitioner's note and chart, and I agree with the management and plan.  HARRAWAY-SMITH, Yavonne Kiss 12:41 PM

## 2012-07-18 ENCOUNTER — Telehealth: Payer: Self-pay | Admitting: Family Medicine

## 2012-07-18 ENCOUNTER — Encounter (HOSPITAL_COMMUNITY): Payer: Self-pay | Admitting: *Deleted

## 2012-07-18 ENCOUNTER — Inpatient Hospital Stay (HOSPITAL_COMMUNITY)
Admission: AD | Admit: 2012-07-18 | Discharge: 2012-07-19 | Disposition: A | Discharge status: Home / Self Care | Payer: Medicaid Other | Source: Ambulatory Visit | Attending: Obstetrics & Gynecology | Admitting: Obstetrics & Gynecology

## 2012-07-18 DIAGNOSIS — O219 Vomiting of pregnancy, unspecified: Secondary | ICD-10-CM

## 2012-07-18 DIAGNOSIS — R197 Diarrhea, unspecified: Secondary | ICD-10-CM | POA: Insufficient documentation

## 2012-07-18 DIAGNOSIS — O210 Mild hyperemesis gravidarum: Secondary | ICD-10-CM | POA: Insufficient documentation

## 2012-07-18 DIAGNOSIS — R1013 Epigastric pain: Secondary | ICD-10-CM | POA: Insufficient documentation

## 2012-07-18 LAB — URINALYSIS, ROUTINE W REFLEX MICROSCOPIC
Bilirubin Urine: NEGATIVE
Glucose, UA: NEGATIVE mg/dL
Hgb urine dipstick: NEGATIVE
Ketones, ur: NEGATIVE mg/dL
Leukocytes, UA: NEGATIVE
pH: 6 (ref 5.0–8.0)

## 2012-07-18 MED ORDER — SODIUM CHLORIDE 0.9 % IV BOLUS (SEPSIS): 1000.0000 mL | Freq: Once | INTRAVENOUS | Status: DC

## 2012-07-18 MED ORDER — ONDANSETRON HCL 4 MG/2ML IJ SOLN
4.0000 mg | Freq: Once | INTRAMUSCULAR | Status: DC
  Filled 2012-07-18: qty 2

## 2012-07-18 MED ORDER — FAMOTIDINE IN NACL 20-0.9 MG/50ML-% IV SOLN: 20.0000 mg | Freq: Once | INTRAVENOUS | Status: DC

## 2012-07-18 NOTE — MAU Provider Note (Signed)
History     CSN: [id number]  Arrival date and time: 07/18/12 2143   First Provider Initiated Contact with Patient 07/18/12 2307      Chief Complaint  Patient presents with  . Morning Sickness  . Abdominal Pain  . Diarrhea   HPI Susan Hopkins is a 41 y.o. female @ [redacted]w[redacted]d gestation who presents to MAU for nausea, vomiting and diarrhea. The symptoms started today approximately 10:00am. She reports having had 3 loose stools and vomited x 3. Associated symptoms include chills, feeling tired and weak. Was here 3 days ago with headache but go over that. The history was provided by the patient.  OB History    Grav Para Term Preterm Abortions TAB SAB Ect Mult Living   5 3 3  1  1   3       Past Medical History  Diagnosis Date  . ADVANCED MATERNAL AGE 72/02/2008  . DIABETES MELLITUS, GESTATIONAL, HX OF 05/18/2008  . 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 Contact dermatitis 08/27/2011  . 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 Migraine headache     last migraine in Sept 2012  . GERD (gastroesophageal reflux disease)     doesn't take anything for acid reflux  . Gastric ulcer     history of  . Hemorrhoids   . Constipation   . Diarrhea   . ANEMIA, IRON DEFICIENCY, UNSPEC. 02/12/2007    after IUD    Past Surgical History  Procedure Date  . Cesarean section 2005/2010  . Female circumcision 79  . Tonsillectomy 1989  . Dilation and curettage of uterus 2009  . Cholecystectomy 11/28/2011    Procedure: LAPAROSCOPIC CHOLECYSTECTOMY WITH INTRAOPERATIVE CHOLANGIOGRAM;  Surgeon: Wilmon Arms. Corliss Skains, MD;  Location: [facility] OR;  Service: General;  Laterality: N/A;    Family History  Problem Relation Age of Onset  . Diabetes Mother   . Hypertension Mother   . Hyperlipidemia Mother   . Hypertension Father   . Hyperlipidemia Father   . Anesthesia problems Neg Hx   . Hypotension Neg Hx   . Malignant hyperthermia Neg Hx   . Pseudochol deficiency Neg  Hx     History  Substance Use Topics  . Smoking status: Never Smoker   . Smokeless tobacco: Never Used  . Alcohol Use: No    Allergies:  Allergies  Allergen Reactions  . Oxycodone Itching    All over the body    Prescriptions prior to admission  Medication Sig Dispense Refill  . acetaminophen (TYLENOL) 325 MG tablet Take 650 mg by mouth every 6 (six) hours as needed.      Marland Kitchen aluminum & magnesium hydroxide (MAALOX) 225-200 MG/5ML suspension Take 10 mLs by mouth every 6 (six) hours as needed. For indigestion      . doxylamine, Sleep, (UNISOM) 25 MG tablet Take 12.5 mg by mouth 3 (three) times daily.      . folic acid (FOLVITE) 800 MCG tablet Take 400 mcg by mouth daily.      . IRON PO Take 1 tablet by mouth daily.      . ondansetron (ZOFRAN) 4 MG tablet Take 4 mg by mouth every 8 (eight) hours as needed.      . Pyridoxine HCl (VITAMIN B-6) 25 MG tablet Take 12.5 mg by mouth 3 (three) times daily.      . ranitidine (ZANTAC) 150 MG tablet Take 150 mg by mouth 2 (two) times daily.  Review of Systems  Constitutional: Positive for chills and malaise/fatigue. Negative for fever and weight loss.  HENT: Negative for ear pain, nosebleeds, congestion, sore throat and neck pain.   Eyes: Negative for blurred vision, double vision, photophobia and pain.  Respiratory: Negative for cough, shortness of breath and wheezing.   Cardiovascular: Negative for chest pain, palpitations and leg swelling.  Gastrointestinal: Positive for heartburn, nausea, vomiting, abdominal pain and diarrhea. Negative for constipation.  Genitourinary: Negative for dysuria, urgency and frequency.  Musculoskeletal: Positive for myalgias and back pain.  Skin: Negative for itching and rash.  Neurological: Negative for dizziness, sensory change, speech change, seizures, weakness and headaches.  Endo/Heme/Allergies: Does not bruise/bleed easily.  Psychiatric/Behavioral: Negative for depression. The patient is not  nervous/anxious and does not have insomnia.    Physical Exam   Blood pressure 133/82, pulse 101, temperature 99.2 F (37.3 C), temperature source Oral, resp. rate 20, height 5\' 5"  (1.651 m), weight 174 lb 4 oz (79.039 kg), last menstrual period 05/02/2012.  Physical Exam  Nursing note and vitals reviewed. Constitutional: She is oriented to person, place, and time. No distress.       Spitting into emesis bag.  HENT:  Head: Normocephalic and atraumatic.  Eyes: EOM are normal.  Neck: Neck supple.  Cardiovascular:       tachycardia  Respiratory: Effort normal.  GI: Soft. There is no tenderness.  Genitourinary: Vagina normal.  Musculoskeletal: Normal range of motion.  Neurological: She is alert and oriented to person, place, and time.  Skin: Skin is warm and dry.  Psychiatric: She has a normal mood and affect. Her behavior is normal. Judgment and thought content normal.    MAU Course  Procedures Assessment: 41 y.o. female @ [redacted]w[redacted]d gestation with epigastric pain, nausea, vomiting and diarrhea   Plan:  IV hydration   Zofran 4 mg IV   Pepcid 20 MG IV  Note: Difficulty with IV placement, RN unable to establish IV. Patient states she does not want anyone to try again after 2 sticks. Zofran order changed to Zofran 8 mg. ODT and Pepcid 20 mg. Po  @ 12:06 am patient states she is feeling better and wants to go home. Taking po fluids and eating crackers without difficulty.  Rx Phenergan  Follow-up Information    Call [facility].   Contact information:   [address] [place] [address]          Tuyen, Uncapher  Home Medication Instructions [id number]   Printed on:07/19/12 0023  Medication Information                    doxylamine, Sleep, (UNISOM) 25 MG tablet Take 12.5 mg by mouth 3 (three) times daily.           Pyridoxine HCl (VITAMIN B-6) 25 MG tablet Take 12.5 mg by mouth 3 (three) times daily.           acetaminophen (TYLENOL) 325  MG tablet Take 650 mg by mouth every 6 (six) hours as needed.           ondansetron (ZOFRAN) 4 MG tablet Take 4 mg by mouth every 8 (eight) hours as needed.           ranitidine (ZANTAC) 150 MG tablet Take 150 mg by mouth 2 (two) times daily.           IRON PO Take 1 tablet by mouth daily.  folic acid (FOLVITE) 800 MCG tablet Take 400 mcg by mouth daily.           aluminum & magnesium hydroxide (MAALOX) 225-200 MG/5ML suspension Take 10 mLs by mouth every 6 (six) hours as needed. For indigestion           promethazine (PHENERGAN) 25 MG tablet Take 0.5 tablets (12.5 mg total) by mouth every 6 (six) hours as needed for nausea.            Results for orders placed during the hospital encounter of 07/18/12 (from the past 24 hour(s))  URINALYSIS, ROUTINE W REFLEX MICROSCOPIC     Status: Abnormal   Collection Time   07/18/12  9:59 PM      Component Value Range   Color, Urine YELLOW  YELLOW   APPearance CLEAR  CLEAR   Specific Gravity, Urine >1.030 (*) 1.005 - 1.030   pH 6.0  5.0 - 8.0   Glucose, UA NEGATIVE  NEGATIVE mg/dL   Hgb urine dipstick NEGATIVE  NEGATIVE   Bilirubin Urine NEGATIVE  NEGATIVE   Ketones, ur NEGATIVE  NEGATIVE mg/dL   Protein, ur NEGATIVE  NEGATIVE mg/dL   Urobilinogen, UA 0.2  0.0 - 1.0 mg/dL   Nitrite NEGATIVE  NEGATIVE   Leukocytes, UA NEGATIVE  NEGATIVE  CBC WITH DIFFERENTIAL     Status: Abnormal   Collection Time   07/18/12 11:45 PM      Component Value Range   WBC 6.5  4.0 - 10.5 K/uL   RBC 4.60  3.87 - 5.11 MIL/uL   Hemoglobin 11.2 (*) 12.0 - 15.0 g/dL   HCT 40.9 (*) 81.1 - 91.4 %   MCV 75.7 (*) 78.0 - 100.0 fL   MCH 24.3 (*) 26.0 - 34.0 pg   MCHC 32.2  30.0 - 36.0 g/dL   RDW 78.2 (*) 95.6 - 21.3 %   Platelets 262  150 - 400 K/uL   Neutrophils Relative 64  43 - 77 %   Neutro Abs 4.2  1.7 - 7.7 K/uL   Lymphocytes Relative 28  12 - 46 %   Lymphs Abs 1.8  0.7 - 4.0 K/uL   Monocytes Relative 6  3 - 12 %   Monocytes Absolute 0.4  0.1 -  1.0 K/uL   Eosinophils Relative 2  0 - 5 %   Eosinophils Absolute 0.1  0.0 - 0.7 K/uL   Basophils Relative 0  0 - 1 %   Basophils Absolute 0.0  0.0 - 0.1 K/uL  COMPREHENSIVE METABOLIC PANEL     Status: Abnormal   Collection Time   07/18/12 11:45 PM      Component Value Range   Sodium 135  135 - 145 mEq/L   Potassium 3.9  3.5 - 5.1 mEq/L   Chloride 103  96 - 112 mEq/L   CO2 21  19 - 32 mEq/L   Glucose, Bld 105 (*) 70 - 99 mg/dL   BUN 8  6 - 23 mg/dL   Creatinine, Ser 0.86 (*) 0.50 - 1.10 mg/dL   Calcium 9.3  8.4 - 57.8 mg/dL   Total Protein 6.3  6.0 - 8.3 g/dL   Albumin 3.2 (*) 3.5 - 5.2 g/dL   AST 12  0 - 37 U/L   ALT 13  0 - 35 U/L   Alkaline Phosphatase 51  39 - 117 U/L   Total Bilirubin 0.1 (*) 0.3 - 1.2 mg/dL   GFR calc non Af Amer >90  >90  mL/min   GFR calc Af Amer >90  >90 mL/min   NEESE,HOPE, RN, FNP, [facility] 07/19/2012, 12:25 AM

## 2012-07-18 NOTE — Telephone Encounter (Signed)
Patient called emergency line with complaint of painless spotting (red blood on tissue, scant) after wiping. She is [redacted] weeks pregnant. This is her 5 th pregnancy. She denies contraction and leakage of fluid. Last sexual intercourse 1 week ago.   Recommendation: 1. Reassurance: scant painless bleeding ca occur in normal pregnancy.  2. Advised patient to seek medical attention if she develops pain or increased bleeding like a period. She  She should go to MAU during nights and weekends. Call and come to [facility] during the week. She agreed with plan and voiced understanding.

## 2012-07-18 NOTE — MAU Note (Signed)
Pt states, " I've vomited three times today and had three loose stools. I have pain and cramping in the middle of my abdomen."

## 2012-07-19 LAB — CBC WITH DIFFERENTIAL/PLATELET
Basophils Relative: 0 % (ref 0–1)
HCT: 34.8 % — ABNORMAL LOW (ref 36.0–46.0)
Hemoglobin: 11.2 g/dL — ABNORMAL LOW (ref 12.0–15.0)
MCHC: 32.2 g/dL (ref 30.0–36.0)
MCV: 75.7 fL — ABNORMAL LOW (ref 78.0–100.0)
Monocytes Absolute: 0.4 10*3/uL (ref 0.1–1.0)
Monocytes Relative: 6 % (ref 3–12)
Neutro Abs: 4.2 10*3/uL (ref 1.7–7.7)

## 2012-07-19 LAB — COMPREHENSIVE METABOLIC PANEL
Albumin: 3.2 g/dL — ABNORMAL LOW (ref 3.5–5.2)
BUN: 8 mg/dL (ref 6–23)
CO2: 21 mEq/L (ref 19–32)
Chloride: 103 mEq/L (ref 96–112)
Creatinine, Ser: 0.34 mg/dL — ABNORMAL LOW (ref 0.50–1.10)
GFR calc non Af Amer: >90 mL/min (ref >90)
Total Bilirubin: 0.1 mg/dL — ABNORMAL LOW (ref 0.3–1.2)

## 2012-07-19 MED ORDER — PROMETHAZINE HCL 25 MG PO TABS: 12.5000 mg | ORAL_TABLET | Freq: Four times a day (QID) | ORAL | Status: DC | PRN

## 2012-07-19 MED ORDER — FAMOTIDINE 20 MG PO TABS
20.0000 mg | ORAL_TABLET | Freq: Once | ORAL | Status: AC
  Administered 2012-07-19: 20 mg via ORAL
  Filled 2012-07-19: qty 1

## 2012-07-19 MED ORDER — ONDANSETRON 8 MG PO TBDP
8.0000 mg | ORAL_TABLET | Freq: Once | ORAL | Status: AC
  Administered 2012-07-19: 8 mg via ORAL
  Filled 2012-07-19: qty 1

## 2012-07-20 ENCOUNTER — Ambulatory Visit (INDEPENDENT_AMBULATORY_CARE_PROVIDER_SITE_OTHER): Payer: Medicaid Other | Admitting: Family Medicine

## 2012-07-20 VITALS — BP 114/70 | Temp 99.9°F | Wt 173.0 lb

## 2012-07-20 DIAGNOSIS — Z3480 Encounter for supervision of other normal pregnancy, unspecified trimester: Secondary | ICD-10-CM

## 2012-07-20 DIAGNOSIS — Z3490 Encounter for supervision of normal pregnancy, unspecified, unspecified trimester: Secondary | ICD-10-CM

## 2012-07-20 DIAGNOSIS — O99810 Abnormal glucose complicating pregnancy: Secondary | ICD-10-CM

## 2012-07-20 DIAGNOSIS — O24419 Gestational diabetes mellitus in pregnancy, unspecified control: Secondary | ICD-10-CM

## 2012-07-20 HISTORY — DX: Gestational diabetes mellitus in pregnancy, unspecified control: O24.419

## 2012-07-20 LAB — POCT GLYCOSYLATED HEMOGLOBIN (HGB A1C): Hemoglobin A1C: 6.1

## 2012-07-20 NOTE — Progress Notes (Signed)
Patient presents after having bright red blood spotting x1 on Saturday. Through Sunday she had some occasional brownish discharge with wiping which has now stopped.  Patient was seen in MAU on Saturday and had abdominal pain with diarrhea but no notation of bleeding noted. No ultrasound performed. Will refer for ultrasound due to bleeding and abdominal pain in previous days to rule out ectopic. Marland Kitchen Fetal HR 155 today reassuring for viable pregnancy  Have also referred at this time to high risk OB due to 1 hour glucola >200. CMET when in MAU. A1c today as well as 24 hour urine.  ABO RH typing-Rh positive previously due to way test was performed now Rh negative due to difference in reading Rh typing. Will ask High risk ob to weigh in on if needs Rhogam. Light spotting on Saturday not indication.  Will need c-section Hyperemesis continues-but multiple medications somewhat helping.

## 2012-07-20 NOTE — Assessment & Plan Note (Signed)
Refer to high risk OB. Patient already starting diet that she used in previous pregnancy for GDM>  A1c, 24 hour urine today-already had cmet.

## 2012-07-20 NOTE — Patient Instructions (Addendum)
Dear Susan Hopkins,   I am sorry you have been having such a tough time this pregnancy. I appreciate you coming in today for your spotting. We do not need to give you the Rhogam shot at this time. I will refer you today to the [facility] doctors at [facility] hospital due to your diabetes. We are going to get some labs to help them at this time.    I am glad your pain is better but given you had bleeding along with abdominal pain, we want to send you for an ultrasound as well. The OB doctors will be able to see the ultrasound once they start caring for you.   Sincerely,  Dr. Tana Conch  Gestational Diabetes Mellitus Gestational diabetes mellitus (GDM) is diabetes that occurs only during pregnancy. This happens when the body cannot properly handle the glucose (sugar) that increases in the blood after eating. During pregnancy, insulin resistance (reduced sensitivity to insulin) occurs because of the release of hormones from the placenta. Usually, the pancreas of pregnant women produces enough insulin to overcome the resistance that occurs. However, in gestational diabetes, the insulin is there but it does not work effectively. If the resistance is severe enough that the pancreas does not produce enough insulin, extra glucose builds up in the blood.  WHO IS AT RISK FOR DEVELOPING GESTATIONAL DIABETES?  Women with a history of diabetes in the family.   Women over age 4.   Women who are overweight.   Women in certain ethnic groups (Hispanic, African American, Native American, [place] and [place] Islander).  WHAT CAN HAPPEN TO THE BABY? If the mother's blood glucose is too high while she is pregnant, the extra sugar will travel through the umbilical cord to the baby. Some of the problems the baby may have are:  Large Baby - If the baby receives too much sugar, the baby will gain more weight. This may cause the baby to be too large to be born normally (vaginally) and a Cesarean section (C-section) may be  needed.   Low Blood Glucose (hypoglycemia) - The baby makes extra insulin, in response to the extra sugar its gets from its mother. When the baby is born and no longer needs this extra insulin, the baby's blood glucose level may drop.   Jaundice (yellow coloring of the skin and eyes) - This is fairly common in babies. It is caused from a build-up of the chemical called bilirubin. This is rarely serious, but is seen more often in babies whose mothers had gestational diabetes.  RISKS TO THE MOTHER Women who have had gestational diabetes may be at higher risk for some problems, including:  Preeclampsia or toxemia, which includes problems with high blood pressure. Blood pressure and protein levels in the urine must be checked frequently.   Infections.   Cesarean section (C-section) for delivery.   Developing Type 2 diabetes later in life. About 30-50% will develop diabetes later, especially if obese.  DIAGNOSIS  The hormones that cause insulin resistance are highest at about 24-28 weeks of pregnancy. If symptoms are experienced, they are much like symptoms you would normally expect during pregnancy.  GDM is often diagnosed using a two part method: 1. After 24-28 weeks of pregnancy, the woman drinks a glucose solution and takes a blood test. If the glucose level is high, a second test will be given.  2. Oral Glucose Tolerance Test (OGTT) which is 3 hours long - After not eating overnight, the blood glucose is checked. The  woman drinks a glucose solution, and hourly blood glucose tests are taken.  If the woman has risk factors for GDM, the caregiver may test earlier than 24 weeks of pregnancy. TREATMENT  Treatment of GDM is directed at keeping the mother's blood glucose level normal, and may include:  Meal planning.   Taking insulin or other medicine to control your blood glucose level.   Exercise.   Keeping a daily record of the foods you eat.   Blood glucose monitoring and keeping a  record of your blood glucose levels.   May monitor ketone levels in the urine, although this is no longer considered necessary in most pregnancies.  HOME CARE INSTRUCTIONS  While you are pregnant:  Follow your caregiver's advice regarding your prenatal appointments, meal planning, exercise, medicines, vitamins, blood and other tests, and physical activities.   Keep a record of your meals, blood glucose tests, and the amount of insulin you are taking (if any). Show this to your caregiver at every prenatal visit.   If you have GDM, you may have problems with hypoglycemia (low blood glucose). You may suspect this if you become suddenly dizzy, feel shaky, and/or weak. If you think this is happening and you have a glucose meter, try to test your blood glucose level. Follow your caregiver's advice for when and how to treat your low blood glucose. Generally, the 15:15 rule is followed: Treat by consuming 15 grams of carbohydrates, wait 15 minutes, and recheck blood glucose. Examples of 15 grams of carbohydrates are:   1 cup skim or low-fat milk.    cup juice.   3-4 glucose tablets.   5-6 hard candies.   1 small box raisins.    cup regular soda pop.   Practice good hygiene, to avoid infections.   Do not smoke.  SEEK MEDICAL CARE IF:   You develop abnormal vaginal discharge, with or without itching.   You become weak and tired more than expected.   You seem to sweat a lot.   You have a sudden increase in weight, 5 pounds or more in one week.   You are losing weight, 3 pounds or more in a week.   Your blood glucose level is high, and you need instructions on what to do about it.  SEEK IMMEDIATE MEDICAL CARE IF:   You develop a severe headache.   You faint or pass out.   You develop nausea and vomiting.   You become disoriented or confused.   You have a convulsion.   You develop vision problems.   You develop stomach pain.   You develop vaginal bleeding.   You develop  uterine contractions.   You have leaking or a gush of fluid from the vagina.  AFTER YOU HAVE THE BABY:  Go to all of your follow-up appointments, and have blood tests as advised by your caregiver.   Maintain a healthy lifestyle, to prevent diabetes in the future. This includes:   Following a healthy meal plan.   Controlling your weight.   Getting enough exercise and proper rest.   Do not smoke.   Breastfeed your baby if you can. This will lower the chance of you and your baby developing diabetes later in life.  For more information about diabetes, go to the American Diabetes Association at: PMFashions.com.cy. For more information about gestational diabetes, go to the [employer] at: RentRule.com.au. Document Released: 03/10/2001 Document Revised: 11/21/2011 Document Reviewed: 10/02/2009 [facility] Patient Information 2012 [place], [place].

## 2012-07-21 ENCOUNTER — Other Ambulatory Visit: Payer: Self-pay | Admitting: Family Medicine

## 2012-07-21 ENCOUNTER — Ambulatory Visit (HOSPITAL_COMMUNITY)
Admission: RE | Admit: 2012-07-21 | Discharge: 2012-07-21 | Disposition: A | Discharge status: Home / Self Care | Payer: Medicaid Other | Source: Ambulatory Visit | Attending: Family Medicine | Admitting: Family Medicine

## 2012-07-21 DIAGNOSIS — O210 Mild hyperemesis gravidarum: Secondary | ICD-10-CM | POA: Insufficient documentation

## 2012-07-21 DIAGNOSIS — Z3490 Encounter for supervision of normal pregnancy, unspecified, unspecified trimester: Secondary | ICD-10-CM

## 2012-07-21 DIAGNOSIS — O09299 Supervision of pregnancy with other poor reproductive or obstetric history, unspecified trimester: Secondary | ICD-10-CM | POA: Insufficient documentation

## 2012-07-21 DIAGNOSIS — O209 Hemorrhage in early pregnancy, unspecified: Secondary | ICD-10-CM | POA: Insufficient documentation

## 2012-07-22 ENCOUNTER — Encounter: Payer: Self-pay | Admitting: Obstetrics & Gynecology

## 2012-07-23 ENCOUNTER — Encounter: Payer: Self-pay | Admitting: Family Medicine

## 2012-07-23 ENCOUNTER — Telehealth: Payer: Self-pay | Admitting: Family Medicine

## 2012-07-23 NOTE — Telephone Encounter (Signed)
Due to some vaginal bleeding-early ultrasound performed and showed viable intrauterine pregnancy. Called patient to inform of results. States she has high risk OB appointment next week. Relieved that ultrasound looked ok.

## 2012-07-27 ENCOUNTER — Ambulatory Visit (INDEPENDENT_AMBULATORY_CARE_PROVIDER_SITE_OTHER): Payer: Medicaid Other | Admitting: Obstetrics & Gynecology

## 2012-07-27 ENCOUNTER — Other Ambulatory Visit: Payer: Self-pay | Admitting: Obstetrics & Gynecology

## 2012-07-27 ENCOUNTER — Encounter: Payer: Self-pay | Admitting: Obstetrics & Gynecology

## 2012-07-27 ENCOUNTER — Encounter: Payer: Medicaid Other | Attending: Obstetrics & Gynecology | Admitting: Dietician

## 2012-07-27 VITALS — BP 129/79 | Temp 97.8°F | Wt 175.5 lb

## 2012-07-27 DIAGNOSIS — Z713 Dietary counseling and surveillance: Secondary | ICD-10-CM | POA: Insufficient documentation

## 2012-07-27 DIAGNOSIS — Z3682 Encounter for antenatal screening for nuchal translucency: Secondary | ICD-10-CM

## 2012-07-27 DIAGNOSIS — N6320 Unspecified lump in the left breast, unspecified quadrant: Secondary | ICD-10-CM

## 2012-07-27 DIAGNOSIS — O99810 Abnormal glucose complicating pregnancy: Secondary | ICD-10-CM

## 2012-07-27 DIAGNOSIS — Z3490 Encounter for supervision of normal pregnancy, unspecified, unspecified trimester: Secondary | ICD-10-CM

## 2012-07-27 DIAGNOSIS — O09529 Supervision of elderly multigravida, unspecified trimester: Secondary | ICD-10-CM

## 2012-07-27 DIAGNOSIS — O24419 Gestational diabetes mellitus in pregnancy, unspecified control: Secondary | ICD-10-CM

## 2012-07-27 DIAGNOSIS — N63 Unspecified lump in unspecified breast: Secondary | ICD-10-CM

## 2012-07-27 LAB — POCT URINALYSIS DIP (DEVICE)
Glucose, UA: NEGATIVE mg/dL
Nitrite: NEGATIVE
Urobilinogen, UA: 0.2 mg/dL (ref 0.0–1.0)

## 2012-07-27 NOTE — Progress Notes (Signed)
Diabetes Education:  Has a history of GDM with her second second pregnancy.  Was able to control her glucose with diet and exercise.  Did not experience GDM with her third pregnancy.  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  We did a brief review of the eating patterns and the glucose testing times and parameters.  Her Medicaid has not been approved at this time.  I provided her with a True Track meter and strips and lancets to use until her Medicaid has been approved and she can get the prescription filled.  Meter Kit Lot [id number] EXP: 2014/07/15 given Strips:[id number] EXP: 2014/10/15 and 1 box lancets Lot:[id number]  EXP: 2016/05/18.  Glucose today 2hr after her breakfast was at 114 mg/dl.  Instructed to monitor fasting and 2 hr post meal glucose levels and to bring meter and glucose log to each clinic appointment.  Maggie Santos Hardwick, RN, RD, CDE.

## 2012-07-27 NOTE — Progress Notes (Signed)
Pulse- 106  Vaginal discharge- "yellowish/whitish" Weight gain 25-35 lbs.  Needs to see [facility] booklet, decline WIC info., and tdap info.

## 2012-07-27 NOTE — Progress Notes (Signed)
[facility] appointment Tuesday, July 28, 2012 at 1 pm. Breast U/S scheduled at the [facility] on July 30, 2012 at 1015 am.

## 2012-07-27 NOTE — Progress Notes (Signed)
Changed due date from [place] on 07/20/12.  Pt has 2 blood types in the computer.  O positive and O negative.  Will get repeat blood type and notify lab about problem.  Pt c/s "spitting"  No nausea.  Needs f/u breast US on left from ? Mass in left breast 12/12.  Diabetes Management today.  Offered genetics counseling.

## 2012-07-28 ENCOUNTER — Ambulatory Visit (HOSPITAL_COMMUNITY): Admission: RE | Admit: 2012-07-28 | Payer: Medicaid Other | Source: Ambulatory Visit

## 2012-07-28 ENCOUNTER — Encounter: Payer: Self-pay | Admitting: Obstetrics & Gynecology

## 2012-07-28 ENCOUNTER — Ambulatory Visit (HOSPITAL_COMMUNITY)
Admission: RE | Admit: 2012-07-28 | Discharge: 2012-07-28 | Disposition: A | Discharge status: Home / Self Care | Payer: Medicaid Other | Source: Ambulatory Visit | Attending: Obstetrics & Gynecology | Admitting: Obstetrics & Gynecology

## 2012-07-28 VITALS — BP 132/81 | HR 96 | Wt 177.0 lb

## 2012-07-28 DIAGNOSIS — Z3682 Encounter for antenatal screening for nuchal translucency: Secondary | ICD-10-CM

## 2012-07-28 DIAGNOSIS — O26899 Other specified pregnancy related conditions, unspecified trimester: Secondary | ICD-10-CM | POA: Insufficient documentation

## 2012-07-28 DIAGNOSIS — O99810 Abnormal glucose complicating pregnancy: Secondary | ICD-10-CM | POA: Insufficient documentation

## 2012-07-28 DIAGNOSIS — O09529 Supervision of elderly multigravida, unspecified trimester: Secondary | ICD-10-CM | POA: Insufficient documentation

## 2012-07-28 DIAGNOSIS — O351XX0 Maternal care for (suspected) chromosomal abnormality in fetus, not applicable or unspecified: Secondary | ICD-10-CM | POA: Insufficient documentation

## 2012-07-28 DIAGNOSIS — O3510X0 Maternal care for (suspected) chromosomal abnormality in fetus, unspecified, not applicable or unspecified: Secondary | ICD-10-CM | POA: Insufficient documentation

## 2012-07-28 DIAGNOSIS — Z3689 Encounter for other specified antenatal screening: Secondary | ICD-10-CM | POA: Insufficient documentation

## 2012-07-28 DIAGNOSIS — O358XX0 Maternal care for other (suspected) fetal abnormality and damage, not applicable or unspecified: Secondary | ICD-10-CM

## 2012-07-28 DIAGNOSIS — Z6791 Unspecified blood type, Rh negative: Secondary | ICD-10-CM | POA: Insufficient documentation

## 2012-07-29 LAB — CULTURE, OB URINE: Colony Count: 25000

## 2012-07-30 ENCOUNTER — Encounter: Payer: Self-pay | Admitting: Obstetrics & Gynecology

## 2012-07-30 ENCOUNTER — Other Ambulatory Visit: Payer: Self-pay | Admitting: Obstetrics & Gynecology

## 2012-07-30 ENCOUNTER — Ambulatory Visit
Admission: RE | Admit: 2012-07-30 | Discharge: 2012-07-30 | Disposition: A | Discharge status: Home / Self Care | Payer: Medicaid Other | Source: Ambulatory Visit | Attending: Obstetrics & Gynecology | Admitting: Obstetrics & Gynecology

## 2012-07-30 DIAGNOSIS — R928 Other abnormal and inconclusive findings on diagnostic imaging of breast: Secondary | ICD-10-CM | POA: Insufficient documentation

## 2012-07-30 DIAGNOSIS — N6320 Unspecified lump in the left breast, unspecified quadrant: Secondary | ICD-10-CM

## 2012-07-30 NOTE — Progress Notes (Signed)
Genetic Counseling  High-Risk Gestation Note  Appointment Date:  07/28/2012 Referred By: Adam Phenix, MD Date of Birth:  [birth year]  Pregnancy History: [id number] Estimated Date of Delivery: 01/29/13 Estimated Gestational Age: [redacted]w[redacted]d Attending: Particia Nearing, MD   Ms. Susan Hopkins was seen for genetic counseling because of a maternal age of 41 y.o..     She was counseled regarding maternal age and the association with risk for chromosome conditions due to nondisjunction with aging of the ova.   We reviewed chromosomes, nondisjunction, and the associated 1 in 13 risk for fetal aneuploidy at [redacted]w[redacted]d related to a maternal age of 41 y.o. at delivery.  She was counseled that the risk for aneuploidy decreases as gestational age increases, accounting for those pregnancies which spontaneously abort.  We specifically discussed Down syndrome (trisomy 63), trisomies 20 and 42, and sex chromosome aneuploidies (47,XXX and 47,XXY) including the common features and prognoses of each.   We reviewed available screening options including First screen, Quad screen, noninvasive prenatal testing (NIPT), and detailed ultrasound. She understands that screening tests are used to modify a patient's a priori risk for aneuploidy, typically based on age.  This estimate provides a pregnancy specific risk assessment.  Specifically, we discussed that NIPT analyzes cell free fetal DNA found in the maternal circulation. This test is not diagnostic for chromosome conditions, but can provide information regarding the presence or absence of extra fetal DNA for chromosomes 13, 18, 21, X, and Y, and missing fetal DNA for chromosome X and Y (Turner syndrome). Thus, it would not identify or rule out all genetic conditions. The reported detection rate is greater than 99% for Trisomy 21, greater than 98% for Trisomy 18, and is approximately 80% (8 out of 10) for Trisomy 13. The false positive rate is reported to be less than 0.1% for any of  these conditions.  In addition, we discussed that ~50-80% of fetuses with Down syndrome and up to 90-95% of fetuses with trisomy 18/13, when well visualized, have detectable anomalies or soft markers by detailed ultrasound (~18+ weeks gestation). She was also counseled regarding diagnostic testing via CVS or amniocentesis.  We reviewed the approximate 1 in 100 risk for complications for CVS and the approximate 1 in 300-500 risk for complications for amniocentesis, including spontaneous pregnancy loss. We discussed the risks, limitations, and benefits of each screening and testing option.   After consideration of all the options, she elected to proceed with ultrasound screening only including nuchal translucency ultrasound assessment today and targeted ultrasound in the second trimester. She declined blood work screening for aneuploidy (including First screen, Quad screen, and NIPT) and diagnostic testing (CVS and amniocentesis).   Ultrasound performed at the time of today's visit visualized an increased nuchal translucency measurement of 3.9 mm.  The ultrasound report is reported separately.   We discussed that the fetal NT refers to a fluid filled space between the skin and soft tissues behind the cervical spine.  Ms. Susan Hopkins was counseled regarding the various common etiologies for an enlarged NT including: aneuploidy, single gene conditions, cardiac or great vessel abnormalities, lymphatic system failure, decreased fetal movement, and fetal anemia.  An NT of 3.9 mm is associated with a fetal aneuploidy risk of approximately 20%.   In addition, an NT of 3.9 mm is associated with an approximate 3% risk for a fetal cardiac anomaly.  We also discussed single gene conditions and that these conditions are not routinely tested for prenatally unless ultrasound findings or family history  significantly increase the suspicion of a specific single gene disorder. In addition to the previously discussed screening and  testing options for aneuploidy, the option of fetal echocardiogram was discussed.  After thoughtful consideration, she again declined additional maternal blood screening for aneuploidy as well as diagnostic testing via CVS or amniocentesis. Ms. Susan Hopkins elected to return for targeted ultrasound in the second trimester, scheduled for 09/01/12, and elected to proceed with fetal echocardiogram in the second trimester.  She was counseled that the fetal prognosis depends on the underlying etiology of the enlarged NT and further anticipatory guidance can be provided if a diagnosis is discovered. She understands that ultrasound cannot rule out all birth defects or genetic syndromes. The patient was advised of this limitation and states she still does not want diagnostic testing at this time.   Ms. Susan Hopkins was provided with written information regarding sickle cell anemia (SCA) including the carrier frequency and incidence in the African population, the availability of carrier testing and prenatal diagnosis if indicated.  In addition, we discussed that hemoglobinopathies are routinely screened for as part of the Bowers newborn screening panel.  She declined hemoglobin electrophoresis today.  Both family histories were reviewed and found to be contributory for recurrent pregnancy loss for the patient's female maternal first cousin. This relative also has four children who are reportedly healthy. Approximately 1 in 6 confirmed pregnancies results in miscarriage. A single underlying cause is more likely to be suspected when a couple has experienced 3 or more losses. It is less likely that there will be an identifiable single underlying cause when a couple has experienced less than 3 losses. We discussed several possible causes could include chromosome rearrangements, antibodies, and thrombophilia. However, we also discussed that the majority of cases do not have implications for extended relatives, and that an  underlying cause is not identified in approximately 50-75% of couples. The family history was otherwise noncontributory for birth defects, mental retardation, and known genetic conditions. Without further information regarding the provided family history, an accurate genetic risk cannot be calculated. Further genetic counseling is warranted if more information is obtained.  Ms. Emilyann Banka denied exposure to environmental toxins or chemical agents. She denied the use of alcohol, tobacco or street drugs. She denied significant viral illnesses during the course of her pregnancy. Her medical and surgical histories were contributory for gestational diabetes in the current pregnancy. She previously met with diabetes educator.    I counseled Ms. Sindhu Friedt regarding the above risks and available options.  The approximate face-to-face time with the genetic counselor was 40 minutes.  Quinn Plowman, MS,  Certified Genetic Counselor 07/30/2012

## 2012-08-03 ENCOUNTER — Ambulatory Visit (INDEPENDENT_AMBULATORY_CARE_PROVIDER_SITE_OTHER): Payer: Medicaid Other | Admitting: Family Medicine

## 2012-08-03 VITALS — BP 139/81 | Temp 98.8°F | Wt 176.6 lb

## 2012-08-03 DIAGNOSIS — O24419 Gestational diabetes mellitus in pregnancy, unspecified control: Secondary | ICD-10-CM

## 2012-08-03 DIAGNOSIS — O99810 Abnormal glucose complicating pregnancy: Secondary | ICD-10-CM

## 2012-08-03 DIAGNOSIS — O0990 Supervision of high risk pregnancy, unspecified, unspecified trimester: Secondary | ICD-10-CM

## 2012-08-03 LAB — POCT URINALYSIS DIP (DEVICE)
Bilirubin Urine: NEGATIVE
Glucose, UA: NEGATIVE mg/dL
Hgb urine dipstick: NEGATIVE
Ketones, ur: NEGATIVE mg/dL
Leukocytes, UA: NEGATIVE
Nitrite: NEGATIVE
pH: 5.5 (ref 5.0–8.0)

## 2012-08-03 NOTE — Patient Instructions (Signed)
Gestational Diabetes Mellitus Gestational diabetes mellitus (GDM) is diabetes that occurs only during pregnancy. This happens when the body cannot properly handle the glucose (sugar) that increases in the blood after eating. During pregnancy, insulin resistance (reduced sensitivity to insulin) occurs because of the release of hormones from the placenta. Usually, the pancreas of pregnant women produces enough insulin to overcome the resistance that occurs. However, in gestational diabetes, the insulin is there but it does not work effectively. If the resistance is severe enough that the pancreas does not produce enough insulin, extra glucose builds up in the blood.  WHO IS AT RISK FOR DEVELOPING GESTATIONAL DIABETES?  Women with a history of diabetes in the family.   Women over age 50.   Women who are overweight.   Women in certain ethnic groups (Hispanic, African American, Native American, [place] and [place]er).  WHAT CAN HAPPEN TO THE BABY? If the mother's blood glucose is too high while she is pregnant, the extra sugar will travel through the umbilical cord to the baby. Some of the problems the baby may have are:  Large Baby - If the baby receives too much sugar, the baby will gain more weight. This may cause the baby to be too large to be born normally (vaginally) and a Cesarean section (C-section) may be needed.   Low Blood Glucose (hypoglycemia) - The baby makes extra insulin, in response to the extra sugar its gets from its mother. When the baby is born and no longer needs this extra insulin, the baby's blood glucose level may drop.   Jaundice (yellow coloring of the skin and eyes) - This is fairly common in babies. It is caused from a build-up of the chemical called bilirubin. This is rarely serious, but is seen more often in babies whose mothers had gestational diabetes.  RISKS TO THE MOTHER Women who have had gestational diabetes may be at higher risk for some problems,  including:  Preeclampsia or toxemia, which includes problems with high blood pressure. Blood pressure and protein levels in the urine must be checked frequently.   Infections.   Cesarean section (C-section) for delivery.    Pregnancy - Second Trimester The second trimester of pregnancy (3 to 6 months) is a period of rapid growth for you and your baby. At the end of the sixth month, your baby is about 9 inches long and weighs 1 1/2 pounds. You will begin to feel the baby move between 18 and 20 weeks of the pregnancy. This is called quickening. Weight gain is faster. A clear fluid (colostrum) may leak out of your breasts. You may feel small contractions of the womb (uterus). This is known as false labor or Braxton-Hicks contractions. This is like a practice for labor when the baby is ready to be born. Usually, the problems with morning sickness have usually passed by the end of your first trimester. Some women develop small dark blotches (called cholasma, mask of pregnancy) on their face that usually goes away after the baby is born. Exposure to the sun makes the blotches worse. Acne may also develop in some pregnant women and pregnant women who have acne, may find that it goes away. PRENATAL EXAMS  Blood work may continue to be done during prenatal exams. These tests are done to check on your health and the probable health of your baby. Blood work is used to follow your blood levels (hemoglobin). Anemia (low hemoglobin) is common during pregnancy. Iron and vitamins are given to help prevent  this. You will also be checked for diabetes between 24 and 28 weeks of the pregnancy. Some of the previous blood tests may be repeated.   The size of the uterus is measured during each visit. This is to make sure that the baby is continuing to grow properly according to the dates of the pregnancy.   Your blood pressure is checked every prenatal visit. This is to make sure you are not getting toxemia.   Your  urine is checked to make sure you do not have an infection, diabetes or protein in the urine.   Your weight is checked often to make sure gains are happening at the suggested rate. This is to ensure that both you and your baby are growing normally.   Sometimes, an ultrasound is performed to confirm the proper growth and development of the baby. This is a test which bounces harmless sound waves off the baby so your caregiver can more accurately determine due dates.  Sometimes, a specialized test is done on the amniotic fluid surrounding the baby. This test is called an amniocentesis. The amniotic fluid is obtained by sticking a needle into the belly (abdomen). This is done to check the chromosomes in instances where there is a concern about possible genetic problems with the baby. It is also sometimes done near the end of pregnancy if an early delivery is required. In this case, it is done to help make sure the baby's lungs are mature enough for the baby to live outside of the womb. CHANGES OCCURING IN THE SECOND TRIMESTER OF PREGNANCY Your body goes through many changes during pregnancy. They vary from person to person. Talk to your caregiver about changes you notice that you are concerned about.  During the second trimester, you will likely have an increase in your appetite. It is normal to have cravings for certain foods. This varies from person to person and pregnancy to pregnancy.   Your lower abdomen will begin to bulge.   You may have to urinate more often because the uterus and baby are pressing on your bladder. It is also common to get more bladder infections during pregnancy (pain with urination). You can help this by drinking lots of fluids and emptying your bladder before and after intercourse.   You may begin to get stretch marks on your hips, abdomen, and breasts. These are normal changes in the body during pregnancy. There are no exercises or medications to take that prevent this change.    You may begin to develop swollen and bulging veins (varicose veins) in your legs. Wearing support hose, elevating your feet for 15 minutes, 3 to 4 times a day and limiting salt in your diet helps lessen the problem.   Heartburn may develop as the uterus grows and pushes up against the stomach. Antacids recommended by your caregiver helps with this problem. Also, eating smaller meals 4 to 5 times a day helps.   Constipation can be treated with a stool softener or adding bulk to your diet. Drinking lots of fluids, vegetables, fruits, and whole grains are helpful.   Exercising is also helpful. If you have been very active up until your pregnancy, most of these activities can be continued during your pregnancy. If you have been less active, it is helpful to start an exercise program such as walking.   Hemorrhoids (varicose veins in the rectum) may develop at the end of the second trimester. Warm sitz baths and hemorrhoid cream recommended by your caregiver helps  hemorrhoid problems.   Backaches may develop during this time of your pregnancy. Avoid heavy lifting, wear low heal shoes and practice good posture to help with backache problems.   Some pregnant women develop tingling and numbness of their hand and fingers because of swelling and tightening of ligaments in the wrist (carpel tunnel syndrome). This goes away after the baby is born.   As your breasts enlarge, you may have to get a bigger bra. Get a comfortable, cotton, support bra. Do not get a nursing bra until the last month of the pregnancy if you will be nursing the baby.   You may get a dark line from your belly button to the pubic area called the linea nigra.   You may develop rosy cheeks because of increase blood flow to the face.   You may develop spider looking lines of the face, neck, arms and chest. These go away after the baby is born.  HOME CARE INSTRUCTIONS   It is extremely important to avoid all smoking, herbs, alcohol,  and unprescribed drugs during your pregnancy. These chemicals affect the formation and growth of the baby. Avoid these chemicals throughout the pregnancy to ensure the delivery of a healthy infant.   Most of your home care instructions are the same as suggested for the first trimester of your pregnancy. Keep your caregiver's appointments. Follow your caregiver's instructions regarding medication use, exercise and diet.   During pregnancy, you are providing food for you and your baby. Continue to eat regular, well-balanced meals. Choose foods such as meat, fish, milk and other low fat dairy products, vegetables, fruits, and whole-grain breads and cereals. Your caregiver will tell you of the ideal weight gain.   A physical sexual relationship may be continued up until near the end of pregnancy if there are no other problems. Problems could include early (premature) leaking of amniotic fluid from the membranes, vaginal bleeding, abdominal pain, or other medical or pregnancy problems.   Exercise regularly if there are no restrictions. Check with your caregiver if you are unsure of the safety of some of your exercises. The greatest weight gain will occur in the last 2 trimesters of pregnancy. Exercise will help you:   Control your weight.   Get you in shape for labor and delivery.   Lose weight after you have the baby.   Wear a good support or jogging bra for breast tenderness during pregnancy. This may help if worn during sleep. Pads or tissues may be used in the bra if you are leaking colostrum.   Do not use hot tubs, steam rooms or saunas throughout the pregnancy.   Wear your seat belt at all times when driving. This protects you and your baby if you are in an accident.   Avoid raw meat, uncooked cheese, cat litter boxes and soil used by cats. These carry germs that can cause birth defects in the baby.   The second trimester is also a good time to visit your dentist for your dental health if  this has not been done yet. Getting your teeth cleaned is OK. Use a soft toothbrush. Brush gently during pregnancy.   It is easier to loose urine during pregnancy. Tightening up and strengthening the pelvic muscles will help with this problem. Practice stopping your urination while you are going to the bathroom. These are the same muscles you need to strengthen. It is also the muscles you would use as if you were trying to stop from passing gas.  You can practice tightening these muscles up 10 times a set and repeating this about 3 times per day. Once you know what muscles to tighten up, do not perform these exercises during urination. It is more likely to contribute to an infection by backing up the urine.   Ask for help if you have financial, counseling or nutritional needs during pregnancy. Your caregiver will be able to offer counseling for these needs as well as refer you for other special needs.   Your skin may become oily. If so, wash your face with mild soap, use non-greasy moisturizer and oil or cream based makeup.  MEDICATIONS AND DRUG USE IN PREGNANCY  Take prenatal vitamins as directed. The vitamin should contain 1 milligram of folic acid. Keep all vitamins out of reach of children. Only a couple vitamins or tablets containing iron may be fatal to a baby or young child when ingested.   Avoid use of all medications, including herbs, over-the-counter medications, not prescribed or suggested by your caregiver. Only take over-the-counter or prescription medicines for pain, discomfort, or fever as directed by your caregiver. Do not use aspirin.   Let your caregiver also know about herbs you may be using.   Alcohol is related to a number of birth defects. This includes fetal alcohol syndrome. All alcohol, in any form, should be avoided completely. Smoking will cause low birth rate and premature babies.   Street or illegal drugs are very harmful to the baby. They are absolutely forbidden. A baby  born to an addicted mother will be addicted at birth. The baby will go through the same withdrawal an adult does.  SEEK MEDICAL CARE IF:  You have any concerns or worries during your pregnancy. It is better to call with your questions if you feel they cannot wait, rather than worry about them. SEEK IMMEDIATE MEDICAL CARE IF:   An unexplained oral temperature above 102 F (38.9 C) develops, or as your caregiver suggests.   You have leaking of fluid from the vagina (birth canal). If leaking membranes are suspected, take your temperature and tell your caregiver of this when you call.   There is vaginal spotting, bleeding, or passing clots. Tell your caregiver of the amount and how many pads are used. Light spotting in pregnancy is common, especially following intercourse.   You develop a bad smelling vaginal discharge with a change in the color from clear to white.   You continue to feel sick to your stomach (nauseated) and have no relief from remedies suggested. You vomit blood or coffee ground-like materials.   You lose more than 2 pounds of weight or gain more than 2 pounds of weight over 1 week, or as suggested by your caregiver.   You notice swelling of your face, hands, feet, or legs.   You get exposed to [place] measles and have never had them.   You are exposed to fifth disease or chickenpox.   You develop belly (abdominal) pain. Round ligament discomfort is a common non-cancerous (benign) cause of abdominal pain in pregnancy. Your caregiver still must evaluate you.   You develop a bad headache that does not go away.   You develop fever, diarrhea, pain with urination, or shortness of breath.   You develop visual problems, blurry, or double vision.   You fall or are in a car accident or any kind of trauma.   There is mental or physical violence at home.  Document Released: 11/26/2001 Document Revised: 11/21/2011 Document Reviewed: 05/31/2009  [employer] Patient Information 2012  [place], [place].  Developing Type 2 diabetes later in life. About 30-50% will develop diabetes later, especially if obese.  DIAGNOSIS  The hormones that cause insulin resistance are highest at about 24-28 weeks of pregnancy. If symptoms are experienced, they are much like symptoms you would normally expect during pregnancy.  GDM is often diagnosed using a two part method: 1. After 24-28 weeks of pregnancy, the woman drinks a glucose solution and takes a blood test. If the glucose level is high, a second test will be given.  2. Oral Glucose Tolerance Test (OGTT) which is 3 hours long - After not eating overnight, the blood glucose is checked. The woman drinks a glucose solution, and hourly blood glucose tests are taken.  If the woman has risk factors for GDM, the caregiver may test earlier than 24 weeks of pregnancy. TREATMENT  Treatment of GDM is directed at keeping the mother's blood glucose level normal, and may include:  Meal planning.   Taking insulin or other medicine to control your blood glucose level.   Exercise.   Keeping a daily record of the foods you eat.   Blood glucose monitoring and keeping a record of your blood glucose levels.   May monitor ketone levels in the urine, although this is no longer considered necessary in most pregnancies.  HOME CARE INSTRUCTIONS  While you are pregnant:  Follow your caregiver's advice regarding your prenatal appointments, meal planning, exercise, medicines, vitamins, blood and other tests, and physical activities.   Keep a record of your meals, blood glucose tests, and the amount of insulin you are taking (if any). Show this to your caregiver at every prenatal visit.   If you have GDM, you may have problems with hypoglycemia (low blood glucose). You may suspect this if you become suddenly dizzy, feel shaky, and/or weak. If you think this is happening and you have a glucose meter, try to test your blood glucose level. Follow your caregiver's  advice for when and how to treat your low blood glucose. Generally, the 15:15 rule is followed: Treat by consuming 15 grams of carbohydrates, wait 15 minutes, and recheck blood glucose. Examples of 15 grams of carbohydrates are:   1 cup skim or low-fat milk.    cup juice.   3-4 glucose tablets.   5-6 hard candies.   1 small box raisins.    cup regular soda pop.   Practice good hygiene, to avoid infections.   Do not smoke.  SEEK MEDICAL CARE IF:   You develop abnormal vaginal discharge, with or without itching.   You become weak and tired more than expected.   You seem to sweat a lot.   You have a sudden increase in weight, 5 pounds or more in one week.   You are losing weight, 3 pounds or more in a week.   Your blood glucose level is high, and you need instructions on what to do about it.  SEEK IMMEDIATE MEDICAL CARE IF:   You develop a severe headache.   You faint or pass out.   You develop nausea and vomiting.   You become disoriented or confused.   You have a convulsion.   You develop vision problems.   You develop stomach pain.   You develop vaginal bleeding.   You develop uterine contractions.   You have leaking or a gush of fluid from the vagina.  AFTER YOU HAVE THE BABY:  Go to all of your follow-up appointments,  and have blood tests as advised by your caregiver.   Maintain a healthy lifestyle, to prevent diabetes in the future. This includes:   Following a healthy meal plan.   Controlling your weight.   Getting enough exercise and proper rest.   Do not smoke.   Breastfeed your baby if you can. This will lower the chance of you and your baby developing diabetes later in life.  For more information about diabetes, go to the American Diabetes Association at: PMFashions.com.cy. For more information about gestational diabetes, go to the [employer] at: [email or web address].au. Document Released:  03/10/2001 Document Revised: 11/21/2011 Document Reviewed: 10/02/2009 [facility] Patient Information 2012 [place], [place].

## 2012-08-03 NOTE — Progress Notes (Signed)
Pulse: 104

## 2012-08-03 NOTE — Progress Notes (Signed)
GDM:  A1C 6.1, 1 hour GTT 202.  Diet controlled for now, checking fasting (76 to 102) and 2-hour PP 83 to 154. Pt recognizes meal contents that make sugars higher. Will continue diet control and f/u in 2 weeks.  AMA:  Has seen geneticist - declines quad screen, incr nuchal translucency, will get fetal echo and 2nd trimester scan (scheduled in September). Blood type discrepancy - O positive in past but 3 recent labs show O neg. Has never received Rhogam -will continue to investigate this discrepancy. Nausea controlled, not using zofran very often. BP slightly elevated today. Recheck 120s/80s.  Denies HA. Does have occasional blurry vision. Has not done 24 hour urine but negative UAs to date.

## 2012-08-05 ENCOUNTER — Encounter: Payer: Self-pay | Admitting: Family Medicine

## 2012-08-06 ENCOUNTER — Encounter: Payer: Self-pay | Admitting: Obstetrics & Gynecology

## 2012-08-10 ENCOUNTER — Encounter: Payer: Medicaid Other | Admitting: Dietician

## 2012-08-10 ENCOUNTER — Ambulatory Visit (INDEPENDENT_AMBULATORY_CARE_PROVIDER_SITE_OTHER): Payer: Medicaid Other | Admitting: Family Medicine

## 2012-08-10 VITALS — BP 126/74 | Temp 97.5°F | Wt 175.8 lb

## 2012-08-10 DIAGNOSIS — O24419 Gestational diabetes mellitus in pregnancy, unspecified control: Secondary | ICD-10-CM

## 2012-08-10 DIAGNOSIS — K117 Disturbances of salivary secretion: Secondary | ICD-10-CM

## 2012-08-10 DIAGNOSIS — O99810 Abnormal glucose complicating pregnancy: Secondary | ICD-10-CM

## 2012-08-10 LAB — POCT URINALYSIS DIP (DEVICE)
Bilirubin Urine: NEGATIVE
Glucose, UA: NEGATIVE mg/dL
Hgb urine dipstick: NEGATIVE
Leukocytes, UA: NEGATIVE
Nitrite: NEGATIVE
pH: 6 (ref 5.0–8.0)

## 2012-08-10 MED ORDER — GLYCOPYRROLATE 2 MG PO TABS: 2.0000 mg | ORAL_TABLET | Freq: Three times a day (TID) | ORAL | Status: DC

## 2012-08-10 MED ORDER — ACCU-CHEK FASTCLIX LANCETS MISC: 1.0000 [IU] | Freq: Four times a day (QID) | Status: DC

## 2012-08-10 MED ORDER — GLYBURIDE 2.5 MG PO TABS: 2.5000 mg | ORAL_TABLET | Freq: Two times a day (BID) | ORAL | Status: DC

## 2012-08-10 MED ORDER — PRENATAL VITAMINS 0.8 MG PO TABS: 1.0000 | ORAL_TABLET | Freq: Every day | ORAL | Status: DC

## 2012-08-10 MED ORDER — GLUCOSE BLOOD VI STRP: ORAL_STRIP | Status: DC

## 2012-08-10 NOTE — Progress Notes (Signed)
Diabetes Education:  Seen today for obtaining the  Medicaid meter.  Has obtained Medicaid insurance.  Provided the Acu-Chek SmartView meter Kit; Lot [id number] Exp: 11/14/2013.  On return demonstration, blood glucose was 67 mg/dl.  Dr. Shawnie Pons is to order the strips and lancets.  Maggie Meg Niemeier, RN, RD, CDE

## 2012-08-10 NOTE — Progress Notes (Signed)
P=[phone] 

## 2012-08-10 NOTE — Patient Instructions (Addendum)
Pregnancy - Second Trimester The second trimester of pregnancy (3 to 6 months) is a period of rapid growth for you and your baby. At the end of the sixth month, your baby is about 9 inches long and weighs 1 1/2 pounds. You will begin to feel the baby move between 18 and 20 weeks of the pregnancy. This is called quickening. Weight gain is faster. A clear fluid (colostrum) may leak out of your breasts. You may feel small contractions of the womb (uterus). This is known as false labor or Braxton-Hicks contractions. This is like a practice for labor when the baby is ready to be born. Usually, the problems with morning sickness have usually passed by the end of your first trimester. Some women develop small dark blotches (called cholasma, mask of pregnancy) on their face that usually goes away after the baby is born. Exposure to the sun makes the blotches worse. Acne may also develop in some pregnant women and pregnant women who have acne, may find that it goes away. PRENATAL EXAMS  Blood work may continue to be done during prenatal exams. These tests are done to check on your health and the probable health of your baby. Blood work is used to follow your blood levels (hemoglobin). Anemia (low hemoglobin) is common during pregnancy. Iron and vitamins are given to help prevent this. You will also be checked for diabetes between 24 and 28 weeks of the pregnancy. Some of the previous blood tests may be repeated.   The size of the uterus is measured during each visit. This is to make sure that the baby is continuing to grow properly according to the dates of the pregnancy.   Your blood pressure is checked every prenatal visit. This is to make sure you are not getting toxemia.   Your urine is checked to make sure you do not have an infection, diabetes or protein in the urine.   Your weight is checked often to make sure gains are happening at the suggested rate. This is to ensure that both you and your baby are  growing normally.   Sometimes, an ultrasound is performed to confirm the proper growth and development of the baby. This is a test which bounces harmless sound waves off the baby so your caregiver can more accurately determine due dates.  Sometimes, a specialized test is done on the amniotic fluid surrounding the baby. This test is called an amniocentesis. The amniotic fluid is obtained by sticking a needle into the belly (abdomen). This is done to check the chromosomes in instances where there is a concern about possible genetic problems with the baby. It is also sometimes done near the end of pregnancy if an early delivery is required. In this case, it is done to help make sure the baby's lungs are mature enough for the baby to live outside of the womb. CHANGES OCCURING IN THE SECOND TRIMESTER OF PREGNANCY Your body goes through many changes during pregnancy. They vary from person to person. Talk to your caregiver about changes you notice that you are concerned about.  During the second trimester, you will likely have an increase in your appetite. It is normal to have cravings for certain foods. This varies from person to person and pregnancy to pregnancy.   Your lower abdomen will begin to bulge.   You may have to urinate more often because the uterus and baby are pressing on your bladder. It is also common to get more bladder infections during pregnancy (  pain with urination). You can help this by drinking lots of fluids and emptying your bladder before and after intercourse.   You may begin to get stretch marks on your hips, abdomen, and breasts. These are normal changes in the body during pregnancy. There are no exercises or medications to take that prevent this change.   You may begin to develop swollen and bulging veins (varicose veins) in your legs. Wearing support hose, elevating your feet for 15 minutes, 3 to 4 times a day and limiting salt in your diet helps lessen the problem.    Heartburn may develop as the uterus grows and pushes up against the stomach. Antacids recommended by your caregiver helps with this problem. Also, eating smaller meals 4 to 5 times a day helps.   Constipation can be treated with a stool softener or adding bulk to your diet. Drinking lots of fluids, vegetables, fruits, and whole grains are helpful.   Exercising is also helpful. If you have been very active up until your pregnancy, most of these activities can be continued during your pregnancy. If you have been less active, it is helpful to start an exercise program such as walking.   Hemorrhoids (varicose veins in the rectum) may develop at the end of the second trimester. Warm sitz baths and hemorrhoid cream recommended by your caregiver helps hemorrhoid problems.   Backaches may develop during this time of your pregnancy. Avoid heavy lifting, wear low heal shoes and practice good posture to help with backache problems.   Some pregnant women develop tingling and numbness of their hand and fingers because of swelling and tightening of ligaments in the wrist (carpel tunnel syndrome). This goes away after the baby is born.   As your breasts enlarge, you may have to get a bigger bra. Get a comfortable, cotton, support bra. Do not get a nursing bra until the last month of the pregnancy if you will be nursing the baby.   You may get a dark line from your belly button to the pubic area called the linea nigra.   You may develop rosy cheeks because of increase blood flow to the face.   You may develop spider looking lines of the face, neck, arms and chest. These go away after the baby is born.  HOME CARE INSTRUCTIONS   It is extremely important to avoid all smoking, herbs, alcohol, and unprescribed drugs during your pregnancy. These chemicals affect the formation and growth of the baby. Avoid these chemicals throughout the pregnancy to ensure the delivery of a healthy infant.   Most of your home  care instructions are the same as suggested for the first trimester of your pregnancy. Keep your caregiver's appointments. Follow your caregiver's instructions regarding medication use, exercise and diet.   During pregnancy, you are providing food for you and your baby. Continue to eat regular, well-balanced meals. Choose foods such as meat, fish, milk and other low fat dairy products, vegetables, fruits, and whole-grain breads and cereals. Your caregiver will tell you of the ideal weight gain.   A physical sexual relationship may be continued up until near the end of pregnancy if there are no other problems. Problems could include early (premature) leaking of amniotic fluid from the membranes, vaginal bleeding, abdominal pain, or other medical or pregnancy problems.   Exercise regularly if there are no restrictions. Check with your caregiver if you are unsure of the safety of some of your exercises. The greatest weight gain will occur in the   last 2 trimesters of pregnancy. Exercise will help you:   Control your weight.   Get you in shape for labor and delivery.   Lose weight after you have the baby.   Wear a good support or jogging bra for breast tenderness during pregnancy. This may help if worn during sleep. Pads or tissues may be used in the bra if you are leaking colostrum.   Do not use hot tubs, steam rooms or saunas throughout the pregnancy.   Wear your seat belt at all times when driving. This protects you and your baby if you are in an accident.   Avoid raw meat, uncooked cheese, cat litter boxes and soil used by cats. These carry germs that can cause birth defects in the baby.   The second trimester is also a good time to visit your dentist for your dental health if this has not been done yet. Getting your teeth cleaned is OK. Use a soft toothbrush. Brush gently during pregnancy.   It is easier to loose urine during pregnancy. Tightening up and strengthening the pelvic muscles will  help with this problem. Practice stopping your urination while you are going to the bathroom. These are the same muscles you need to strengthen. It is also the muscles you would use as if you were trying to stop from passing gas. You can practice tightening these muscles up 10 times a set and repeating this about 3 times per day. Once you know what muscles to tighten up, do not perform these exercises during urination. It is more likely to contribute to an infection by backing up the urine.   Ask for help if you have financial, counseling or nutritional needs during pregnancy. Your caregiver will be able to offer counseling for these needs as well as refer you for other special needs.   Your skin may become oily. If so, wash your face with mild soap, use non-greasy moisturizer and oil or cream based makeup.  MEDICATIONS AND DRUG USE IN PREGNANCY  Take prenatal vitamins as directed. The vitamin should contain 1 milligram of folic acid. Keep all vitamins out of reach of children. Only a couple vitamins or tablets containing iron may be fatal to a baby or young child when ingested.   Avoid use of all medications, including herbs, over-the-counter medications, not prescribed or suggested by your caregiver. Only take over-the-counter or prescription medicines for pain, discomfort, or fever as directed by your caregiver. Do not use aspirin.   Let your caregiver also know about herbs you may be using.   Alcohol is related to a number of birth defects. This includes fetal alcohol syndrome. All alcohol, in any form, should be avoided completely. Smoking will cause low birth rate and premature babies.   Street or illegal drugs are very harmful to the baby. They are absolutely forbidden. A baby born to an addicted mother will be addicted at birth. The baby will go through the same withdrawal an adult does.  SEEK MEDICAL CARE IF:  You have any concerns or worries during your pregnancy. It is better to call with  your questions if you feel they cannot wait, rather than worry about them. SEEK IMMEDIATE MEDICAL CARE IF:   An unexplained oral temperature above 102 F (38.9 C) develops, or as your caregiver suggests.   You have leaking of fluid from the vagina (birth canal). If leaking membranes are suspected, take your temperature and tell your caregiver of this when you call.   There   is vaginal spotting, bleeding, or passing clots. Tell your caregiver of the amount and how many pads are used. Light spotting in pregnancy is common, especially following intercourse.   You develop a bad smelling vaginal discharge with a change in the color from clear to white.   You continue to feel sick to your stomach (nauseated) and have no relief from remedies suggested. You vomit blood or coffee ground-like materials.   You lose more than 2 pounds of weight or gain more than 2 pounds of weight over 1 week, or as suggested by your caregiver.   You notice swelling of your face, hands, feet, or legs.   You get exposed to [place] measles and have never had them.   You are exposed to fifth disease or chickenpox.   You develop belly (abdominal) pain. Round ligament discomfort is a common non-cancerous (benign) cause of abdominal pain in pregnancy. Your caregiver still must evaluate you.   You develop a bad headache that does not go away.   You develop fever, diarrhea, pain with urination, or shortness of breath.   You develop visual problems, blurry, or double vision.   You fall or are in a car accident or any kind of trauma.   There is mental or physical violence at home.  Document Released: 11/26/2001 Document Revised: 11/21/2011 Document Reviewed: 05/31/2009 [facility] Patient Information 2012 [place], [place]. Contraception Choices Contraception (birth control) is the use of any methods or devices to prevent pregnancy. Below are some methods to help avoid pregnancy. HORMONAL METHODS   Contraceptive implant.  This is a thin, plastic tube containing progesterone hormone. It does not contain estrogen hormone. Your caregiver inserts the tube in the inner part of the upper arm. The tube can remain in place for up to 3 years. After 3 years, the implant must be removed. The implant prevents the ovaries from releasing an egg (ovulation), thickens the cervical mucus which prevents sperm from entering the uterus, and thins the lining of the inside of the uterus.   Progesterone-only injections. These injections are given every 3 months by your caregiver to prevent pregnancy. This synthetic progesterone hormone stops the ovaries from releasing eggs. It also thickens cervical mucus and changes the uterine lining. This makes it harder for sperm to survive in the uterus.   Birth control pills. These pills contain estrogen and progesterone hormone. They work by stopping the egg from forming in the ovary (ovulation). Birth control pills are prescribed by a caregiver.Birth control pills can also be used to treat heavy periods.   Minipill. This type of birth control pill contains only the progesterone hormone. They are taken every day of each month and must be prescribed by your caregiver.   Birth control patch. The patch contains hormones similar to those in birth control pills. It must be changed once a week and is prescribed by a caregiver.   Vaginal ring. The ring contains hormones similar to those in birth control pills. It is left in the vagina for 3 weeks, removed for 1 week, and then a new one is put back in place. The patient must be comfortable inserting and removing the ring from the vagina.A caregiver's prescription is necessary.   Emergency contraception. Emergency contraceptives prevent pregnancy after unprotected sexual intercourse. This pill can be taken right after sex or up to 5 days after unprotected sex. It is most effective the sooner you take the pills after having sexual intercourse. Emergency  contraceptive pills are available without a prescription.  Check with your pharmacist. Do not use emergency contraception as your only form of birth control.  BARRIER METHODS   Female condom. This is a thin sheath (latex or rubber) that is worn over the penis during sexual intercourse. It can be used with spermicide to increase effectiveness.   Female condom. This is a soft, loose-fitting sheath that is put into the vagina before sexual intercourse.   Diaphragm. This is a soft, latex, dome-shaped barrier that must be fitted by a caregiver. It is inserted into the vagina, along with a spermicidal jelly. It is inserted before intercourse. The diaphragm should be left in the vagina for 6 to 8 hours after intercourse.   Cervical cap. This is a round, soft, latex or plastic cup that fits over the cervix and must be fitted by a caregiver. The cap can be left in place for up to 48 hours after intercourse.   Sponge. This is a soft, circular piece of polyurethane foam. The sponge has spermicide in it. It is inserted into the vagina after wetting it and before sexual intercourse.   Spermicides. These are chemicals that kill or block sperm from entering the cervix and uterus. They come in the form of creams, jellies, suppositories, foam, or tablets. They do not require a prescription. They are inserted into the vagina with an applicator before having sexual intercourse. The process must be repeated every time you have sexual intercourse.  INTRAUTERINE CONTRACEPTION  Intrauterine device (IUD). This is a T-shaped device that is put in a woman's uterus during a menstrual period to prevent pregnancy. There are 2 types:   Copper IUD. This type of IUD is wrapped in copper wire and is placed inside the uterus. Copper makes the uterus and fallopian tubes produce a fluid that kills sperm. It can stay in place for 10 years.   Hormone IUD. This type of IUD contains the hormone progestin (synthetic progesterone). The  hormone thickens the cervical mucus and prevents sperm from entering the uterus, and it also thins the uterine lining to prevent implantation of a fertilized egg. The hormone can weaken or kill the sperm that get into the uterus. It can stay in place for 5 years.  PERMANENT METHODS OF CONTRACEPTION  Female tubal ligation. This is when the woman's fallopian tubes are surgically sealed, tied, or blocked to prevent the egg from traveling to the uterus.   Female sterilization. This is when the female has the tubes that carry sperm tied off (vasectomy).This blocks sperm from entering the vagina during sexual intercourse. After the procedure, the man can still ejaculate fluid (semen).  NATURAL PLANNING METHODS  Natural family planning. This is not having sexual intercourse or using a barrier method (condom, diaphragm, cervical cap) on days the woman could become pregnant.   Calendar method. This is keeping track of the length of each menstrual cycle and identifying when you are fertile.   Ovulation method. This is avoiding sexual intercourse during ovulation.   Symptothermal method. This is avoiding sexual intercourse during ovulation, using a thermometer and ovulation symptoms.   Post-ovulation method. This is timing sexual intercourse after you have ovulated.  Regardless of which type or method of contraception you choose, it is important that you use condoms to protect against the transmission of sexually transmitted diseases (STDs). Talk with your caregiver about which form of contraception is most appropriate for you. Document Released: 12/02/2005 Document Revised: 11/21/2011 Document Reviewed: 04/10/2011 [facility] Patient Information 2012 [place], [place]. Breastfeeding BENEFITS OF BREASTFEEDING For  the baby  The first milk (colostrum) helps the baby's digestive system function better.   There are antibodies from the mother in the milk that help the baby fight off infections.   The baby has a  lower incidence of asthma, allergies, and SIDS (sudden infant death syndrome).   The nutrients in breast milk are better than formulas for the baby and helps the baby's brain grow better.   Babies who breastfeed have less gas, colic, and constipation.  For the mother  Breastfeeding helps develop a very special bond between mother and baby.   It is more convenient, always available at the correct temperature and cheaper than formula feeding.   It burns calories in the mother and helps with losing weight that was gained during pregnancy.   It makes the uterus contract back down to normal size faster and slows bleeding following delivery.   Breastfeeding mothers have a lower risk of developing breast cancer.  NURSE FREQUENTLY  A healthy, full-term baby may breastfeed as often as every hour or space his or her feedings to every 3 hours.   How often to nurse will vary from baby to baby. Watch your baby for signs of hunger, not the clock.   Nurse as often as the baby requests, or when you feel the need to reduce the fullness of your breasts.   Awaken the baby if it has been 3 to 4 hours since the last feeding.   Frequent feeding will help the mother make more milk and will prevent problems like sore nipples and engorgement of the breasts.  BABY'S POSITION AT THE BREAST  Whether lying down or sitting, be sure that the baby's tummy is facing your tummy.   Support the breast with 4 fingers underneath the breast and the thumb above. Make sure your fingers are well away from the nipple and baby's mouth.   Stroke the baby's lips and cheek closest to the breast gently with your finger or nipple.   When the baby's mouth is open wide enough, place all of your nipple and as much of the dark area around the nipple as possible into your baby's mouth.   Pull the baby in close so the tip of the nose and the baby's cheeks touch the breast during the feeding.  FEEDINGS  The length of each feeding  varies from baby to baby and from feeding to feeding.   The baby must suck about 2 to 3 minutes for your milk to get to him or her. This is called a "let down." For this reason, allow the baby to feed on each breast as long as he or she wants. Your baby will end the feeding when he or she has received the right balance of nutrients.   To break the suction, put your finger into the corner of the baby's mouth and slide it between his or her gums before removing your breast from his or her mouth. This will help prevent sore nipples.  REDUCING BREAST ENGORGEMENT  In the first week after your baby is born, you may experience signs of breast engorgement. When breasts are engorged, they feel heavy, warm, full, and may be tender to the touch. You can reduce engorgement if you:   Nurse frequently, every 2 to 3 hours. Mothers who breastfeed early and often have fewer problems with engorgement.   Place light ice packs on your breasts between feedings. This reduces swelling. Wrap the ice packs in a lightweight towel to protect  your skin.   Apply moist hot packs to your breast for 5 to 10 minutes before each feeding. This increases circulation and helps the milk flow.   Gently massage your breast before and during the feeding.   Make sure that the baby empties at least one breast at every feeding before switching sides.   Use a breast pump to empty the breasts if your baby is sleepy or not nursing well. You may also want to pump if you are returning to work or or you feel you are getting engorged.   Avoid bottle feeds, pacifiers or supplemental feedings of water or juice in place of breastfeeding.   Be sure the baby is latched on and positioned properly while breastfeeding.   Prevent fatigue, stress, and anemia.   Wear a supportive bra, avoiding underwire styles.   Eat a balanced diet with enough fluids.  If you follow these suggestions, your engorgement should improve in 24 to 48 hours. If you are  still experiencing difficulty, call your lactation consultant or caregiver. IS MY BABY GETTING ENOUGH MILK? Sometimes, mothers worry about whether their babies are getting enough milk. You can be assured that your baby is getting enough milk if:  The baby is actively sucking and you hear swallowing.   The baby nurses at least 8 to 12 times in a 24 hour time period. Nurse your baby until he or she unlatches or falls asleep at the first breast (at least 10 to 20 minutes), then offer the second side.   The baby is wetting 5 to 6 disposable diapers (6 to 8 cloth diapers) in a 24 hour period by 66 to 59 days of age.   The baby is having at least 2 to 3 stools every 24 hours for the first few months. Breast milk is all the food your baby needs. It is not necessary for your baby to have water or formula. In fact, to help your breasts make more milk, it is best not to give your baby supplemental feedings during the early weeks.   The stool should be soft and yellow.   The baby should gain 4 to 7 ounces per week after he is 64 days old.  TAKE CARE OF YOURSELF Take care of your breasts by:  Bathing or showering daily.   Avoiding the use of soaps on your nipples.   Start feedings on your left breast at one feeding and on your right breast at the next feeding.   You will notice an increase in your milk supply 2 to 5 days after delivery. You may feel some discomfort from engorgement, which makes your breasts very firm and often tender. Engorgement "peaks" out within 24 to 48 hours. In the meantime, apply warm moist towels to your breasts for 5 to 10 minutes before feeding. Gentle massage and expression of some milk before feeding will soften your breasts, making it easier for your baby to latch on. Wear a well fitting nursing bra and air dry your nipples for 10 to 15 minutes after each feeding.   Only use cotton bra pads.   Only use pure lanolin on your nipples after nursing. You do not need to wash it  off before nursing.  Take care of yourself by:   Eating well-balanced meals and nutritious snacks.   Drinking milk, fruit juice, and water to satisfy your thirst (about 8 glasses a day).   Getting plenty of rest.   Increasing calcium in your diet (1200 mg  a day).   Avoiding foods that you notice affect the baby in a bad way.  SEEK MEDICAL CARE IF:   You have any questions or difficulty with breastfeeding.   You need help.   You have a hard, red, sore area on your breast, accompanied by a fever of 100.5 F (38.1 C) or more.   Your baby is too sleepy to eat well or is having trouble sleeping.   Your baby is wetting less than 6 diapers per day, by 56 days of age.   Your baby's skin or white part of his or her eyes is more yellow than it was in the hospital.   You feel depressed.  Document Released: 12/02/2005 Document Revised: 11/21/2011 Document Reviewed: 07/17/2009 [facility] Patient Information 2012 [place], [place]. Eczema Atopic dermatitis, or eczema, is an inherited type of sensitive skin. Often people with eczema have a family history of allergies, asthma, or hay fever. It causes a red itchy rash and dry scaly skin. The itchiness may occur before the skin rash and may be very intense. It is not contagious. Eczema is generally worse during the cooler winter months and often improves with the warmth of summer. Eczema usually starts showing signs in infancy. Some children outgrow eczema, but it may last through adulthood. Flare-ups may be caused by:  Eating something or contact with something you are sensitive or allergic to.   Stress.  DIAGNOSIS  The diagnosis of eczema is usually based upon symptoms and medical history. TREATMENT  Eczema cannot be cured, but symptoms usually can be controlled with treatment or avoidance of allergens (things to which you are sensitive or allergic to).  Controlling the itching and scratching.   Use over-the-counter antihistamines as directed  for itching. It is especially useful at night when the itching tends to be worse.   Use over-the-counter steroid creams as directed for itching.   Scratching makes the rash and itching worse and may cause impetigo (a skin infection) if fingernails are contaminated (dirty).   Keeping the skin well moisturized with creams every day. This will seal in moisture and help prevent dryness. Lotions containing alcohol and water can dry the skin and are not recommended.   Limiting exposure to allergens.   Recognizing situations that cause stress.   Developing a plan to manage stress.  HOME CARE INSTRUCTIONS   Take prescription and over-the-counter medicines as directed by your caregiver.   Do not use anything on the skin without checking with your caregiver.   Keep baths or showers short (5 minutes) in warm (not hot) water. Use mild cleansers for bathing. You may add non-perfumed bath oil to the bath water. It is best to avoid soap and bubble bath.   Immediately after a bath or shower, when the skin is still damp, apply a moisturizing ointment to the entire body. This ointment should be a petroleum ointment. This will seal in moisture and help prevent dryness. The thicker the ointment the better. These should be unscented.   Keep fingernails cut short and wash hands often. If your child has eczema, it may be necessary to put soft gloves or mittens on your child at night.   Dress in clothes made of cotton or cotton blends. Dress lightly, as heat increases itching.   Avoid foods that may cause flare-ups. Common foods include cow's milk, peanut butter, eggs and wheat.   Keep a child with eczema away from anyone with fever blisters. The virus that causes fever blisters (herpes simplex)  can cause a serious skin infection in children with eczema.  SEEK MEDICAL CARE IF:   Itching interferes with sleep.   The rash gets worse or is not better within one week following treatment.   The rash looks  infected (pus or soft yellow scabs).   You or your child has an oral temperature above 102 F (38.9 C).   Your baby is older than 3 months with a rectal temperature of 100.5 F (38.1 C) or higher for more than 1 day.   The rash flares up after contact with someone who has fever blisters.  SEEK IMMEDIATE MEDICAL CARE IF:   Your baby is older than 3 months with a rectal temperature of 102 F (38.9 C) or higher.   Your baby is older than 3 months or younger with a rectal temperature of 100.4 F (38 C) or higher.  Document Released: 11/29/2000 Document Revised: 11/21/2011 Document Reviewed: 10/04/2009 [facility] Patient Information 2012 [place], [place].

## 2012-08-10 NOTE — Progress Notes (Signed)
FBS 77-97 (3 of 7 out of range) 2 hr pp 95-166 (11 of 20 out of range)-will start glyburide 2.5 mg bid. Having itching-so we discussed moisturizing and cortisone. O negative according to blood bank.

## 2012-08-11 ENCOUNTER — Telehealth: Payer: Self-pay | Admitting: *Deleted

## 2012-08-11 DIAGNOSIS — O24419 Gestational diabetes mellitus in pregnancy, unspecified control: Secondary | ICD-10-CM

## 2012-08-11 MED ORDER — GLYBURIDE 2.5 MG PO TABS: 2.5000 mg | ORAL_TABLET | Freq: Two times a day (BID) | ORAL | Status: DC

## 2012-08-11 NOTE — Telephone Encounter (Signed)
Called pt and she stated that she has a swollen area @ one of her molars. She denied pain or drainage from the area. She has called several dentists but they do not accept pregnancy Medicaid. I consulted w/Dr. Erin Fulling and then told pt to rinse her mouth w/warm salt water 3-4 times daily. She may also take Tylenol if needed. Pt should call back if she develops pain or fever. Pt voiced understanding of instructions. Pt also stated that her pharmacy did not get the Rx yesterday for Glyburide. Upon review of chart, I observed that the Rx went to the wrong pharmacy. I re-sent the Rx as previously prescribed to pt's current pharmacy and she may pick it up later today. Pt agreed.

## 2012-08-11 NOTE — Telephone Encounter (Signed)
Pt left message stating she has a problem with her tooth. Please call back.

## 2012-08-12 ENCOUNTER — Other Ambulatory Visit: Payer: Self-pay | Admitting: Medical

## 2012-08-12 DIAGNOSIS — O24419 Gestational diabetes mellitus in pregnancy, unspecified control: Secondary | ICD-10-CM

## 2012-08-12 MED ORDER — ACCU-CHEK NANO SMARTVIEW W/DEVICE KIT: 1.0000 | PACK | Freq: Once | Status: DC

## 2012-08-12 NOTE — Telephone Encounter (Signed)
Sent Rx for Glucose meter. Called patient to let her know that the Rx had been sent and that the meter should be available at her pharmacy later today. The patient voiced understanding and did not have any further questions.

## 2012-08-12 NOTE — Telephone Encounter (Signed)
Patient called needs Rx for accucheck meter sent to [facility] on [address].

## 2012-08-13 ENCOUNTER — Telehealth: Payer: Self-pay | Admitting: General Practice

## 2012-08-13 DIAGNOSIS — O24419 Gestational diabetes mellitus in pregnancy, unspecified control: Secondary | ICD-10-CM

## 2012-08-13 MED ORDER — ACCU-CHEK FASTCLIX LANCETS MISC: 1.0000 [IU] | Freq: Four times a day (QID) | Status: DC

## 2012-08-13 MED ORDER — ACCU-CHEK NANO SMARTVIEW W/DEVICE KIT: 1.0000 | PACK | Freq: Once | Status: DC

## 2012-08-13 MED ORDER — GLUCOSE BLOOD VI STRP: ORAL_STRIP | Status: DC

## 2012-08-13 NOTE — Telephone Encounter (Signed)
Pt called and left message stating that she needed her prescription for the accu-check meter to go to the [facility] [address] pharmacy so she could go and pick it up. Called patient back and stated we had received her message and already sent for the accu-meter to go to the [facility] on [address]. Pt was pleased and stated that she needed her strips and lancets to be sent there as well. Informed patient we would make this change for her and that they should be available for pickup at the [facility] on [address]. Pt voiced understanding and had no further questions.

## 2012-08-13 NOTE — Telephone Encounter (Signed)
Pt called and left a message but was unable to understand her due to poor signal. Attempted to call patient back, no answer/left message to return call to clinic

## 2012-08-24 ENCOUNTER — Ambulatory Visit (INDEPENDENT_AMBULATORY_CARE_PROVIDER_SITE_OTHER): Payer: Medicaid Other | Admitting: Obstetrics & Gynecology

## 2012-08-24 VITALS — BP 121/77 | Temp 98.4°F | Wt 177.7 lb

## 2012-08-24 DIAGNOSIS — Z3490 Encounter for supervision of normal pregnancy, unspecified, unspecified trimester: Secondary | ICD-10-CM

## 2012-08-24 DIAGNOSIS — O24419 Gestational diabetes mellitus in pregnancy, unspecified control: Secondary | ICD-10-CM

## 2012-08-24 DIAGNOSIS — O99810 Abnormal glucose complicating pregnancy: Secondary | ICD-10-CM

## 2012-08-24 LAB — POCT URINALYSIS DIP (DEVICE)
Bilirubin Urine: NEGATIVE
Ketones, ur: NEGATIVE mg/dL
Leukocytes, UA: NEGATIVE
Protein, ur: NEGATIVE mg/dL
Specific Gravity, Urine: 1.025 (ref 1.005–1.030)

## 2012-08-24 LAB — COMPREHENSIVE METABOLIC PANEL
ALT: 200 U/L — ABNORMAL HIGH (ref 0–35)
AST: 119 U/L — ABNORMAL HIGH (ref 0–37)
Alkaline Phosphatase: 39 U/L (ref 39–117)
CO2: 22 mEq/L (ref 19–32)
Creat: 0.37 mg/dL — ABNORMAL LOW (ref 0.50–1.10)
Sodium: 136 mEq/L (ref 135–145)
Total Bilirubin: 0.3 mg/dL (ref 0.3–1.2)
Total Protein: 6.2 g/dL (ref 6.0–8.3)

## 2012-08-24 NOTE — Progress Notes (Signed)
Pulse- 96 

## 2012-08-24 NOTE — Progress Notes (Signed)
[place] 29,56,2130,86,57,84,69,62,952  2 hr pp 119-157; 95 to 143.  Pt was taking glyburide at 1 pm adn 7 m.  Will spread  The doses to 12 hours apart.   Pt needs to do 24 hour urine.  Will need to draw CMP when she turns it in.  Needs fetal echo.  Refuses quad screen and further testing even with thickened nuchal translucency.

## 2012-08-24 NOTE — Progress Notes (Signed)
Pt scheduled for fetal echo on 09/14/12 at 2pm. Referral made for [facility], pt given instructions on how to make appt.

## 2012-08-26 ENCOUNTER — Encounter: Payer: Self-pay | Admitting: Obstetrics & Gynecology

## 2012-08-26 DIAGNOSIS — R748 Abnormal levels of other serum enzymes: Secondary | ICD-10-CM | POA: Insufficient documentation

## 2012-08-28 LAB — CREATININE CLEARANCE, URINE, 24 HOUR
Creatinine Clearance: 208 mL/min — ABNORMAL HIGH (ref 75–115)
Creatinine, 24H Ur: 1108 mg/d (ref 700–1800)
Creatinine, Urine: 88.6 mg/dL
Creatinine: 0.37 mg/dL — ABNORMAL LOW (ref 0.50–1.10)

## 2012-08-30 ENCOUNTER — Emergency Department (HOSPITAL_BASED_OUTPATIENT_CLINIC_OR_DEPARTMENT_OTHER)
Admission: EM | Admit: 2012-08-30 | Discharge: 2012-08-30 | Disposition: A | Discharge status: Home / Self Care | Payer: Medicaid Other | Attending: Emergency Medicine | Admitting: Emergency Medicine

## 2012-08-30 ENCOUNTER — Encounter (HOSPITAL_BASED_OUTPATIENT_CLINIC_OR_DEPARTMENT_OTHER): Payer: Self-pay | Admitting: *Deleted

## 2012-08-30 DIAGNOSIS — G43909 Migraine, unspecified, not intractable, without status migrainosus: Secondary | ICD-10-CM | POA: Insufficient documentation

## 2012-08-30 DIAGNOSIS — K219 Gastro-esophageal reflux disease without esophagitis: Secondary | ICD-10-CM | POA: Insufficient documentation

## 2012-08-30 DIAGNOSIS — Z833 Family history of diabetes mellitus: Secondary | ICD-10-CM | POA: Insufficient documentation

## 2012-08-30 DIAGNOSIS — Z885 Allergy status to narcotic agent status: Secondary | ICD-10-CM | POA: Insufficient documentation

## 2012-08-30 DIAGNOSIS — Z8489 Family history of other specified conditions: Secondary | ICD-10-CM | POA: Insufficient documentation

## 2012-08-30 DIAGNOSIS — E119 Type 2 diabetes mellitus without complications: Secondary | ICD-10-CM | POA: Insufficient documentation

## 2012-08-30 DIAGNOSIS — Z3490 Encounter for supervision of normal pregnancy, unspecified, unspecified trimester: Secondary | ICD-10-CM

## 2012-08-30 DIAGNOSIS — Z331 Pregnant state, incidental: Secondary | ICD-10-CM | POA: Insufficient documentation

## 2012-08-30 DIAGNOSIS — Z8249 Family history of ischemic heart disease and other diseases of the circulatory system: Secondary | ICD-10-CM | POA: Insufficient documentation

## 2012-08-30 LAB — CBC
MCH: 24.9 pg — ABNORMAL LOW (ref 26.0–34.0)
MCHC: 32.9 g/dL (ref 30.0–36.0)
MCV: 75.8 fL — ABNORMAL LOW (ref 78.0–100.0)
Platelets: 196 10*3/uL (ref 150–400)
RBC: 4.62 MIL/uL (ref 3.87–5.11)

## 2012-08-30 LAB — URINALYSIS, ROUTINE W REFLEX MICROSCOPIC
Bilirubin Urine: NEGATIVE
Ketones, ur: >80 mg/dL — AB
Nitrite: NEGATIVE
Protein, ur: NEGATIVE mg/dL
pH: 5.5 (ref 5.0–8.0)

## 2012-08-30 LAB — BASIC METABOLIC PANEL
BUN: 6 mg/dL (ref 6–23)
CO2: 20 mEq/L (ref 19–32)
Calcium: 8.8 mg/dL (ref 8.4–10.5)
Creatinine, Ser: 0.4 mg/dL — ABNORMAL LOW (ref 0.50–1.10)
GFR calc non Af Amer: >90 mL/min (ref >90)
Glucose, Bld: 81 mg/dL (ref 70–99)
Sodium: 135 mEq/L (ref 135–145)

## 2012-08-30 MED ORDER — METOCLOPRAMIDE HCL 5 MG/ML IJ SOLN
10.0000 mg | INTRAMUSCULAR | Status: AC
  Administered 2012-08-30: 10 mg via INTRAVENOUS
  Filled 2012-08-30: qty 2

## 2012-08-30 MED ORDER — SODIUM CHLORIDE 0.9 % IV BOLUS (SEPSIS)
1000.0000 mL | INTRAVENOUS | Status: AC
  Administered 2012-08-30: 1000 mL via INTRAVENOUS

## 2012-08-30 MED ORDER — DIPHENHYDRAMINE HCL 50 MG/ML IJ SOLN
25.0000 mg | Freq: Once | INTRAMUSCULAR | Status: AC
  Administered 2012-08-30: 25 mg via INTRAVENOUS
  Filled 2012-08-30: qty 1

## 2012-08-30 NOTE — ED Provider Notes (Signed)
History   This chart was scribed for Tobin Chad, MD by Sofie Rower. The Susan Hopkins was seen in room MH12/MH12 and the Susan Hopkins's care was started at 6:19PM    CSN: [id number]  Arrival date & time 08/30/12  1750   First MD Initiated Contact with Susan Hopkins 08/30/12 1819      Chief Complaint  Susan Hopkins presents with  . Migraine    (Consider location/radiation/quality/duration/timing/severity/associated sxs/prior treatment) Susan Hopkins is a 41 y.o. female presenting with migraines. The history is provided by the Susan Hopkins. No language interpreter was used.  Migraine This is a new problem. The current episode started 6 to 12 hours ago. The problem occurs constantly. The problem has been gradually worsening. Pertinent negatives include no chest pain, no abdominal pain and no shortness of breath. Nothing aggravates the symptoms. Nothing relieves the symptoms. Susan Hopkins has tried acetaminophen for the symptoms. The treatment provided moderate relief.    Susan Hopkins is a 41 y.o. female , with a hx of migraines, who presents to the Emergency Department complaining of sudden, progressively worsening, migraine, onset today, with associated symptoms of nausea and vomiting. The pt reports Susan Hopkins is [redacted] weeks pregnant at present. In addition, the pt informs that Susan Hopkins has had 5 total pregnancies, and has three children at present. The pt reports Susan Hopkins has experienced similar symptoms in the past during her pregnancies. Modifying factors include taking tylenol which provides moderate relief of the migraine. The pt has a hx of allergy to oxycodone and hypertension.    The pt denies fever, cough, rhinorrhea, and shortness of breath.   The pt does not smoke or drink alcohol.   PCP is Dr. Durene Cal.    Past Medical History  Diagnosis Date  . ADVANCED MATERNAL AGE 04/17/2008  . DIABETES MELLITUS, GESTATIONAL, HX OF 05/18/2008  . 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 Contact dermatitis 08/27/2011  . 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 Migraine headache     last migraine in Sept 2012  . GERD (gastroesophageal reflux disease)     doesn't take anything for acid reflux  . Gastric ulcer     history of  . Hemorrhoids   . Constipation   . Diarrhea   . ANEMIA, IRON DEFICIENCY, UNSPEC. 02/12/2007    after IUD    Past Surgical History  Procedure Date  . Cesarean section 2005/2010  . Female circumcision 29  . Tonsillectomy 1989  . Dilation and curettage of uterus 2009  . Cholecystectomy 11/28/2011    Procedure: LAPAROSCOPIC CHOLECYSTECTOMY WITH INTRAOPERATIVE CHOLANGIOGRAM;  Surgeon: Wilmon Arms. Corliss Skains, MD;  Location: [facility] OR;  Service: General;  Laterality: N/A;    Family History  Problem Relation Age of Onset  . Diabetes Mother   . Hypertension Mother   . Hyperlipidemia Mother   . Hypertension Father   . Hyperlipidemia Father   . Anesthesia problems Neg Hx   . Hypotension Neg Hx   . Malignant hyperthermia Neg Hx   . Pseudochol deficiency Neg Hx     History  Substance Use Topics  . Smoking status: Never Smoker   . Smokeless tobacco: Never Used  . Alcohol Use: No    OB History    Grav Para Term Preterm Abortions TAB SAB Ect Mult Living   5 3 3  1  1   3       Review of Systems  Respiratory: Negative for shortness of breath.   Cardiovascular: Negative for chest pain.  Gastrointestinal: Positive  for nausea and vomiting. Negative for abdominal pain.  All other systems reviewed and are negative.    Allergies  Oxycodone  Home Medications   Current Outpatient Rx  Name Route Sig Dispense Refill  . ACCU-CHEK FASTCLIX LANCETS MISC Percutaneous 1 Units by Percutaneous route 4 (four) times daily. 100 each 12    Dx: 648.83  . ACETAMINOPHEN 325 MG PO TABS Oral Take 650 mg by mouth every 6 (six) hours as needed. For headache.    Marland Kitchen BUTALBITAL-ACETAMINOPHEN 50-325 MG PO TABS Oral Take 1 tablet by mouth daily as needed. For headache.    Marland Kitchen ALUMINUM &  MAGNESIUM HYDROXIDE 225-200 MG/5ML PO SUSP Oral Take 10 mLs by mouth every 6 (six) hours as needed. For indigestion    . ACCU-CHEK NANO SMARTVIEW W/DEVICE KIT Does not apply 1 Device by Does not apply route once. 1 kit 0  . DOXYLAMINE SUCCINATE (SLEEP) 25 MG PO TABS Oral Take 12.5 mg by mouth 3 (three) times daily.    Marland Kitchen FOLIC ACID 800 MCG PO TABS Oral Take 400 mcg by mouth daily.    Marland Kitchen GLUCOSE BLOOD VI STRP  Check blood sugars 4x/daily 100 each 12    Dx code:  161.09  . GLYBURIDE 2.5 MG PO TABS Oral Take 1 tablet (2.5 mg total) by mouth 2 (two) times daily with a meal. 60 tablet 3  . GLYCOPYRROLATE 2 MG PO TABS Oral Take 1 tablet (2 mg total) by mouth 3 (three) times daily. 90 tablet 3  . IRON PO Oral Take 1 tablet by mouth daily.    Marland Kitchen PRENATAL VITAMINS 0.8 MG PO TABS Oral Take 1 tablet by mouth daily. 30 tablet 12  . VITAMIN B-6 25 MG PO TABS Oral Take 12.5 mg by mouth 3 (three) times daily.    Marland Kitchen RANITIDINE HCL 150 MG PO TABS Oral Take 150 mg by mouth 2 (two) times daily.    Marland Kitchen ONDANSETRON HCL 4 MG PO TABS Oral Take 4 mg by mouth every 8 (eight) hours as needed.      BP 124/74  Pulse 87  Temp 97.7 F (36.5 C) (Oral)  Resp 20  SpO2 100%  LMP 05/02/2012  Physical Exam  Nursing note and vitals reviewed. Constitutional: Susan Hopkins is oriented to person, place, and time. Susan Hopkins appears well-developed and well-nourished.  HENT:  Head: Normocephalic and atraumatic.  Right Ear: External ear normal.  Left Ear: External ear normal.  Nose: Nose normal.  Mouth/Throat: Oropharynx is clear and moist. No oropharyngeal exudate.  Eyes: Conjunctivae normal, EOM and lids are normal. Pupils are equal, round, and reactive to light. Right eye exhibits no discharge and no exudate. Left eye exhibits no discharge and no exudate. Right conjunctiva is not injected. Right conjunctiva has no hemorrhage. Left conjunctiva is not injected. Left conjunctiva has no hemorrhage. Right eye exhibits no nystagmus. Left eye exhibits  no nystagmus.  Neck: Normal range of motion. Neck supple. No JVD present. No tracheal deviation present. No thyromegaly present.  Cardiovascular: Regular rhythm, intact distal pulses and normal pulses.  Tachycardia present.  Exam reveals no gallop, no distant heart sounds, no friction rub and no decreased pulses.   Murmur heard.  Systolic murmur is present with a grade of 2/6  Pulmonary/Chest: Effort normal and breath sounds normal. No stridor.  Abdominal: Soft. Bowel sounds are normal. Susan Hopkins exhibits no mass. There is no tenderness. There is no rebound and no guarding.       Gravid uterus/abdomen  Musculoskeletal: Normal  range of motion. Susan Hopkins exhibits no edema.  Lymphadenopathy:    Susan Hopkins has no cervical adenopathy.  Neurological: Susan Hopkins is alert and oriented to person, place, and time. Susan Hopkins has normal strength. Susan Hopkins displays no atrophy and no tremor. No cranial nerve deficit. Susan Hopkins exhibits normal muscle tone. Susan Hopkins displays no seizure activity. GCS eye subscore is 4. GCS verbal subscore is 5. GCS motor subscore is 6.       + sensitivity to light, no nuchal rigidity  Skin: Skin is warm and dry. No rash noted. No erythema. No pallor.  Psychiatric: Susan Hopkins has a normal mood and affect. Her behavior is normal.    ED Course  Procedures (including critical care time)  DIAGNOSTIC STUDIES: Oxygen Saturation is 100% on room air, normal by my interpretation.    COORDINATION OF CARE:    7:59PM- Basic metabolic panel, IV fluids, and UA discussed. Pt agrees to treatment.   Results for orders placed during the hospital encounter of 08/30/12  BASIC METABOLIC PANEL      Component Value Range   Sodium 135  135 - 145 mEq/L   Potassium 3.7  3.5 - 5.1 mEq/L   Chloride 102  96 - 112 mEq/L   CO2 20  19 - 32 mEq/L   Glucose, Bld 81  70 - 99 mg/dL   BUN 6  6 - 23 mg/dL   Creatinine, Ser 1.61 (*) 0.50 - 1.10 mg/dL   Calcium 8.8  8.4 - 09.6 mg/dL   GFR calc non Af Amer >90  >90 mL/min   GFR calc Af Amer >90  >90  mL/min  CBC      Component Value Range   WBC 6.5  4.0 - 10.5 K/uL   RBC 4.62  3.87 - 5.11 MIL/uL   Hemoglobin 11.5 (*) 12.0 - 15.0 g/dL   HCT 04.5 (*) 40.9 - 81.1 %   MCV 75.8 (*) 78.0 - 100.0 fL   MCH 24.9 (*) 26.0 - 34.0 pg   MCHC 32.9  30.0 - 36.0 g/dL   RDW 91.4 (*) 78.2 - 95.6 %   Platelets 196  150 - 400 K/uL     No results found.   No diagnosis found.    MDM  Pt presents for evaluation of HA.  Susan Hopkins is [redacted] wks pregnant with a known hx of gestational DM on glyburide.  Susan Hopkins denies fever and states Susan Hopkins has had similar HAs in the past.  There is no clinical evidence of CVA or meningitis.  Plan basic labs, U/A, IVF, IV benadryl and reglan.  Will reassess.   2249.  Pt stable, NAD.  Headache is improved.  Note ketones on u/a but no proteinuria.  Plan discharge home.    I personally performed the services described in this documentation, which was scribed in my presence. The recorded information has been reviewed and considered.     Tobin Chad, MD 08/30/12 2249

## 2012-08-30 NOTE — ED Notes (Signed)
Ambulatory to bathroom, snack provided

## 2012-08-30 NOTE — ED Notes (Signed)
Per EMS:  Pt having migraine headache with nausea.  (history of same) Pt is [redacted] wks pregnant.

## 2012-08-30 NOTE — ED Notes (Signed)
I was called to patient room to help patient with restroom, I failed to get urine sample at that time.

## 2012-08-30 NOTE — ED Notes (Signed)
Pt aware of need for urine sample- cup at bedside

## 2012-08-30 NOTE — ED Notes (Signed)
Pt states she feels better- d/c home with family

## 2012-08-31 ENCOUNTER — Telehealth: Payer: Self-pay | Admitting: *Deleted

## 2012-08-31 NOTE — Telephone Encounter (Signed)
Pt left message stating that she went to the ED last night because of a h/a. She still has a slight h/a today despite taking Tylenol one time (2 tabs). She would like a call back. I called pt and spoke w/her 43 yr old daughter. She stated that Edna was sleeping. I stated that I will call back tomorrow.

## 2012-09-01 ENCOUNTER — Ambulatory Visit (HOSPITAL_COMMUNITY)
Admission: RE | Admit: 2012-09-01 | Discharge: 2012-09-01 | Disposition: A | Discharge status: Home / Self Care | Payer: Medicaid Other | Source: Ambulatory Visit | Attending: Obstetrics & Gynecology | Admitting: Obstetrics & Gynecology

## 2012-09-01 VITALS — BP 114/70 | HR 93 | Wt 180.5 lb

## 2012-09-01 DIAGNOSIS — Z1389 Encounter for screening for other disorder: Secondary | ICD-10-CM | POA: Insufficient documentation

## 2012-09-01 DIAGNOSIS — O99810 Abnormal glucose complicating pregnancy: Secondary | ICD-10-CM | POA: Insufficient documentation

## 2012-09-01 DIAGNOSIS — O09299 Supervision of pregnancy with other poor reproductive or obstetric history, unspecified trimester: Secondary | ICD-10-CM | POA: Insufficient documentation

## 2012-09-01 DIAGNOSIS — O09529 Supervision of elderly multigravida, unspecified trimester: Secondary | ICD-10-CM

## 2012-09-01 DIAGNOSIS — O358XX0 Maternal care for other (suspected) fetal abnormality and damage, not applicable or unspecified: Secondary | ICD-10-CM | POA: Insufficient documentation

## 2012-09-01 DIAGNOSIS — Z363 Encounter for antenatal screening for malformations: Secondary | ICD-10-CM | POA: Insufficient documentation

## 2012-09-01 DIAGNOSIS — O24419 Gestational diabetes mellitus in pregnancy, unspecified control: Secondary | ICD-10-CM

## 2012-09-01 NOTE — Telephone Encounter (Signed)
Called and left message that I was checking to see if she is feeling better. If she is still experiencing problems or wants to speak w/a nurse, please leave a new message. Otherwise we will see her at next appt on 09/07/12.

## 2012-09-03 ENCOUNTER — Telehealth: Payer: Self-pay | Admitting: *Deleted

## 2012-09-03 NOTE — Telephone Encounter (Signed)
Called Susan Hopkins and left a message we are calling from the clinic with some information , please call clinic during office hours

## 2012-09-03 NOTE — Telephone Encounter (Signed)
Pt returned staff call and asked for call back. I called her and left message on her personal voice mail. I stated that one of the tests needed on her 24 hr urine specimen was not done last week and we need for her to re-collect the specimen. I asked her to come to clinic tomorrow for a jug and then to collect specimen on Sunday 9/22 in order to bring to clinic @ her visit on 09/07/12.

## 2012-09-03 NOTE — Telephone Encounter (Signed)
Unable to determine why 24 hour urine for protein cancelled. Have left message for patient to call clinic so we can tell her to recollect and hopefully bring to her next appt 09/07/12

## 2012-09-03 NOTE — Telephone Encounter (Signed)
Message copied by Gerome Apley on Thu Sep 03, 2012  3:48 PM ------      Message from: Lesly Dukes      Created: Thu Sep 03, 2012  1:56 PM       Why was this patient's 24 hour urine protein cancelled?  She will have to recollect.

## 2012-09-03 NOTE — Progress Notes (Signed)
Pt's 24 hour urine protein was canceled in the computer.  The creatinine clearance was done.  Pt will need to recollect to get a baseline 24 hour urine for protein.

## 2012-09-07 ENCOUNTER — Ambulatory Visit (INDEPENDENT_AMBULATORY_CARE_PROVIDER_SITE_OTHER): Payer: Medicaid Other | Admitting: Obstetrics & Gynecology

## 2012-09-07 VITALS — BP 129/80 | Temp 98.6°F | Wt 178.4 lb

## 2012-09-07 DIAGNOSIS — O99810 Abnormal glucose complicating pregnancy: Secondary | ICD-10-CM

## 2012-09-07 DIAGNOSIS — O09529 Supervision of elderly multigravida, unspecified trimester: Secondary | ICD-10-CM

## 2012-09-07 DIAGNOSIS — Z23 Encounter for immunization: Secondary | ICD-10-CM

## 2012-09-07 DIAGNOSIS — O24419 Gestational diabetes mellitus in pregnancy, unspecified control: Secondary | ICD-10-CM

## 2012-09-07 LAB — POCT URINALYSIS DIP (DEVICE)
Ketones, ur: NEGATIVE mg/dL
Leukocytes, UA: NEGATIVE
Protein, ur: NEGATIVE mg/dL
Specific Gravity, Urine: 1.02 (ref 1.005–1.030)
Urobilinogen, UA: 0.2 mg/dL (ref 0.0–1.0)
pH: 6 (ref 5.0–8.0)

## 2012-09-07 MED ORDER — INFLUENZA VIRUS VACC SPLIT PF IM SUSP
0.5000 mL | Freq: Once | INTRAMUSCULAR | Status: AC
  Administered 2012-09-07: 0.5 mL via INTRAMUSCULAR

## 2012-09-07 NOTE — Progress Notes (Signed)
Occasionally gets low sugar if she misses a snack. FBS  Almost all <93, pp <132 except 145 x 1. OK to use Imitrex vs Fioricet for migraine. Schedule eye exam

## 2012-09-07 NOTE — Progress Notes (Signed)
Pulse- 101 

## 2012-09-07 NOTE — Patient Instructions (Signed)
Gestational Diabetes Mellitus Gestational diabetes mellitus (GDM) is diabetes that occurs only during pregnancy. This happens when the body cannot properly handle the glucose (sugar) that increases in the blood after eating. During pregnancy, insulin resistance (reduced sensitivity to insulin) occurs because of the release of hormones from the placenta. Usually, the pancreas of pregnant women produces enough insulin to overcome the resistance that occurs. However, in gestational diabetes, the insulin is there but it does not work effectively. If the resistance is severe enough that the pancreas does not produce enough insulin, extra glucose builds up in the blood.  WHO IS AT RISK FOR DEVELOPING GESTATIONAL DIABETES?  Women with a history of diabetes in the family.   Women over age 25.   Women who are overweight.   Women in certain ethnic groups (Hispanic, African American, Native American, Asian and Pacific Islander).  WHAT CAN HAPPEN TO THE BABY? If the mother's blood glucose is too high while she is pregnant, the extra sugar will travel through the umbilical cord to the baby. Some of the problems the baby may have are:  Large Baby - If the baby receives too much sugar, the baby will gain more weight. This may cause the baby to be too large to be born normally (vaginally) and a Cesarean section (C-section) may be needed.   Low Blood Glucose (hypoglycemia) - The baby makes extra insulin, in response to the extra sugar its gets from its mother. When the baby is born and no longer needs this extra insulin, the baby's blood glucose level may drop.   Jaundice (yellow coloring of the skin and eyes) - This is fairly common in babies. It is caused from a build-up of the chemical called bilirubin. This is rarely serious, but is seen more often in babies whose mothers had gestational diabetes.  RISKS TO THE MOTHER Women who have had gestational diabetes may be at higher risk for some problems,  including:  Preeclampsia or toxemia, which includes problems with high blood pressure. Blood pressure and protein levels in the urine must be checked frequently.   Infections.   Cesarean section (C-section) for delivery.   Developing Type 2 diabetes later in life. About 30-50% will develop diabetes later, especially if obese.  DIAGNOSIS  The hormones that cause insulin resistance are highest at about 24-28 weeks of pregnancy. If symptoms are experienced, they are much like symptoms you would normally expect during pregnancy.  GDM is often diagnosed using a two part method: 1. After 24-28 weeks of pregnancy, the woman drinks a glucose solution and takes a blood test. If the glucose level is high, a second test will be given.  2. Oral Glucose Tolerance Test (OGTT) which is 3 hours long - After not eating overnight, the blood glucose is checked. The woman drinks a glucose solution, and hourly blood glucose tests are taken.  If the woman has risk factors for GDM, the caregiver may test earlier than 24 weeks of pregnancy. TREATMENT  Treatment of GDM is directed at keeping the mother's blood glucose level normal, and may include:  Meal planning.   Taking insulin or other medicine to control your blood glucose level.   Exercise.   Keeping a daily record of the foods you eat.   Blood glucose monitoring and keeping a record of your blood glucose levels.   May monitor ketone levels in the urine, although this is no longer considered necessary in most pregnancies.  HOME CARE INSTRUCTIONS  While you are pregnant:    Follow your caregiver's advice regarding your prenatal appointments, meal planning, exercise, medicines, vitamins, blood and other tests, and physical activities.   Keep a record of your meals, blood glucose tests, and the amount of insulin you are taking (if any). Show this to your caregiver at every prenatal visit.   If you have GDM, you may have problems with hypoglycemia (low  blood glucose). You may suspect this if you become suddenly dizzy, feel shaky, and/or weak. If you think this is happening and you have a glucose meter, try to test your blood glucose level. Follow your caregiver's advice for when and how to treat your low blood glucose. Generally, the 15:15 rule is followed: Treat by consuming 15 grams of carbohydrates, wait 15 minutes, and recheck blood glucose. Examples of 15 grams of carbohydrates are:   1 cup skim or low-fat milk.    cup juice.   3-4 glucose tablets.   5-6 hard candies.   1 small box raisins.    cup regular soda pop.   Practice good hygiene, to avoid infections.   Do not smoke.  SEEK MEDICAL CARE IF:   You develop abnormal vaginal discharge, with or without itching.   You become weak and tired more than expected.   You seem to sweat a lot.   You have a sudden increase in weight, 5 pounds or more in one week.   You are losing weight, 3 pounds or more in a week.   Your blood glucose level is high, and you need instructions on what to do about it.  SEEK IMMEDIATE MEDICAL CARE IF:   You develop a severe headache.   You faint or pass out.   You develop nausea and vomiting.   You become disoriented or confused.   You have a convulsion.   You develop vision problems.   You develop stomach pain.   You develop vaginal bleeding.   You develop uterine contractions.   You have leaking or a gush of fluid from the vagina.  AFTER YOU HAVE THE BABY:  Go to all of your follow-up appointments, and have blood tests as advised by your caregiver.   Maintain a healthy lifestyle, to prevent diabetes in the future. This includes:   Following a healthy meal plan.   Controlling your weight.   Getting enough exercise and proper rest.   Do not smoke.   Breastfeed your baby if you can. This will lower the chance of you and your baby developing diabetes later in life.  For more information about diabetes, go to the American  Diabetes Association at: www.americandiabetesassociation.org. For more information about gestational diabetes, go to the American Congress of Obstetricians and Gynecologists at: www.acog.org. Document Released: 03/10/2001 Document Revised: 11/21/2011 Document Reviewed: 10/02/2009 [employer] Patient Information 2012 [employer]. 

## 2012-09-07 NOTE — Addendum Note (Signed)
Addended by: Franchot Mimes on: 09/07/2012 01:08 PM   Modules accepted: Orders

## 2012-09-10 NOTE — Progress Notes (Signed)
ML on voicemail that  eye appointment with [facility] care is scheduled for Tuesday, October 27, 2012 at 11 am.

## 2012-09-18 DIAGNOSIS — O09529 Supervision of elderly multigravida, unspecified trimester: Secondary | ICD-10-CM

## 2012-09-21 ENCOUNTER — Encounter: Payer: Self-pay | Admitting: Family Medicine

## 2012-09-21 ENCOUNTER — Encounter: Payer: Self-pay | Admitting: *Deleted

## 2012-09-21 ENCOUNTER — Ambulatory Visit (INDEPENDENT_AMBULATORY_CARE_PROVIDER_SITE_OTHER): Payer: Medicaid Other | Admitting: Family Medicine

## 2012-09-21 VITALS — BP 121/75 | Temp 97.0°F | Wt 182.0 lb

## 2012-09-21 DIAGNOSIS — O99810 Abnormal glucose complicating pregnancy: Secondary | ICD-10-CM

## 2012-09-21 DIAGNOSIS — O24419 Gestational diabetes mellitus in pregnancy, unspecified control: Secondary | ICD-10-CM

## 2012-09-21 DIAGNOSIS — O09529 Supervision of elderly multigravida, unspecified trimester: Secondary | ICD-10-CM

## 2012-09-21 LAB — POCT URINALYSIS DIP (DEVICE)
Ketones, ur: NEGATIVE mg/dL
Leukocytes, UA: NEGATIVE
Nitrite: NEGATIVE
Protein, ur: NEGATIVE mg/dL
Urobilinogen, UA: 0.2 mg/dL (ref 0.0–1.0)
pH: 5.5 (ref 5.0–8.0)

## 2012-09-21 NOTE — Progress Notes (Signed)
Pulse- 96 

## 2012-09-21 NOTE — Patient Instructions (Signed)
Gestational Diabetes Mellitus Gestational diabetes mellitus (GDM) is diabetes that occurs only during pregnancy. This happens when the body cannot properly handle the glucose (sugar) that increases in the blood after eating. During pregnancy, insulin resistance (reduced sensitivity to insulin) occurs because of the release of hormones from the placenta. Usually, the pancreas of pregnant women produces enough insulin to overcome the resistance that occurs. However, in gestational diabetes, the insulin is there but it does not work effectively. If the resistance is severe enough that the pancreas does not produce enough insulin, extra glucose builds up in the blood.  WHO IS AT RISK FOR DEVELOPING GESTATIONAL DIABETES?  Women with a history of diabetes in the family.  Women over age 25.  Women who are overweight.  Women in certain ethnic groups (Hispanic, African American, Native American, Asian and Pacific Islander). WHAT CAN HAPPEN TO THE BABY? If the mother's blood glucose is too high while she is pregnant, the extra sugar will travel through the umbilical cord to the baby. Some of the problems the baby may have are:  Large Baby - If the baby receives too much sugar, the baby will gain more weight. This may cause the baby to be too large to be born normally (vaginally) and a Cesarean section (C-section) may be needed.  Low Blood Glucose (hypoglycemia)  The baby makes extra insulin, in response to the extra sugar its gets from its mother. When the baby is born and no longer needs this extra insulin, the baby's blood glucose level may drop.  Jaundice (yellow coloring of the skin and eyes)  This is fairly common in babies. It is caused from a build-up of the chemical called bilirubin. This is rarely serious, but is seen more often in babies whose mothers had gestational diabetes. RISKS TO THE MOTHER Women who have had gestational diabetes may be at higher risk for some problems,  including:  Preeclampsia or toxemia, which includes problems with high blood pressure. Blood pressure and protein levels in the urine must be checked frequently.  Infections.  Cesarean section (C-section) for delivery.  Developing Type 2 diabetes later in life. About 30-50% will develop diabetes later, especially if obese. DIAGNOSIS  The hormones that cause insulin resistance are highest at about 24-28 weeks of pregnancy. If symptoms are experienced, they are much like symptoms you would normally expect during pregnancy.  GDM is often diagnosed using a two part method: 1. After 24-28 weeks of pregnancy, the woman drinks a glucose solution and takes a blood test. If the glucose level is high, a second test will be given. 2. Oral Glucose Tolerance Test (OGTT) which is 3 hours long  After not eating overnight, the blood glucose is checked. The woman drinks a glucose solution, and hourly blood glucose tests are taken. If the woman has risk factors for GDM, the caregiver may test earlier than 24 weeks of pregnancy. TREATMENT  Treatment of GDM is directed at keeping the mother's blood glucose level normal, and may include:  Meal planning.  Taking insulin or other medicine to control your blood glucose level.  Exercise.  Keeping a daily record of the foods you eat.  Blood glucose monitoring and keeping a record of your blood glucose levels.  May monitor ketone levels in the urine, although this is no longer considered necessary in most pregnancies. HOME CARE INSTRUCTIONS  While you are pregnant:  Follow your caregiver's advice regarding your prenatal appointments, meal planning, exercise, medicines, vitamins, blood and other tests, and physical   activities.  Keep a record of your meals, blood glucose tests, and the amount of insulin you are taking (if any). Show this to your caregiver at every prenatal visit.  If you have GDM, you may have problems with hypoglycemia (low blood glucose).  You may suspect this if you become suddenly dizzy, feel shaky, and/or weak. If you think this is happening and you have a glucose meter, try to test your blood glucose level. Follow your caregiver's advice for when and how to treat your low blood glucose. Generally, the 15:15 rule is followed: Treat by consuming 15 grams of carbohydrates, wait 15 minutes, and recheck blood glucose. Examples of 15 grams of carbohydrates are:  1 cup skim or low-fat milk.   cup juice.  3-4 glucose tablets.  5-6 hard candies.  1 small box raisins.   cup regular soda pop.  Practice good hygiene, to avoid infections.  Do not smoke. SEEK MEDICAL CARE IF:   You develop abnormal vaginal discharge, with or without itching.  You become weak and tired more than expected.  You seem to sweat a lot.  You have a sudden increase in weight, 5 pounds or more in one week.  You are losing weight, 3 pounds or more in a week.  Your blood glucose level is high, and you need instructions on what to do about it. SEEK IMMEDIATE MEDICAL CARE IF:   You develop a severe headache.  You faint or pass out.  You develop nausea and vomiting.  You become disoriented or confused.  You have a convulsion.  You develop vision problems.  You develop stomach pain.  You develop vaginal bleeding.  You develop uterine contractions.  You have leaking or a gush of fluid from the vagina. AFTER YOU HAVE THE BABY:  Go to all of your follow-up appointments, and have blood tests as advised by your caregiver.  Maintain a healthy lifestyle, to prevent diabetes in the future. This includes:  Following a healthy meal plan.  Controlling your weight.  Getting enough exercise and proper rest.  Do not smoke.  Breastfeed your baby if you can. This will lower the chance of you and your baby developing diabetes later in life. For more information about diabetes, go to the American Diabetes Association at:  www.americandiabetesassociation.org. For more information about gestational diabetes, go to the American Congress of Obstetricians and Gynecologists at: www.acog.org. Document Released: 03/10/2001 Document Revised: 02/24/2012 Document Reviewed: 10/02/2009 [employer] Patient Information 2013 [employer].  

## 2012-09-21 NOTE — Progress Notes (Signed)
FBS 79-90 2 hr pp 93-155 (7/21 out of range)  Diet related.

## 2012-09-27 IMAGING — MG MM DIGITAL DIAGNOSTIC LIMITED*R*
2 series · 2 of 2 positions shown · non-contrast
Comparison: 11/13/2011 baseline screening mammogram.

CLINICAL DATA: Recall from baseline screening mammography.  There
is a family history of two Shae with breast cancer
(premenopausal).

DIGITAL DIAGNOSTIC RIGHT MAMMOGRAM  AND BILATERAL BREAST
ULTRASOUND:

[R CC]
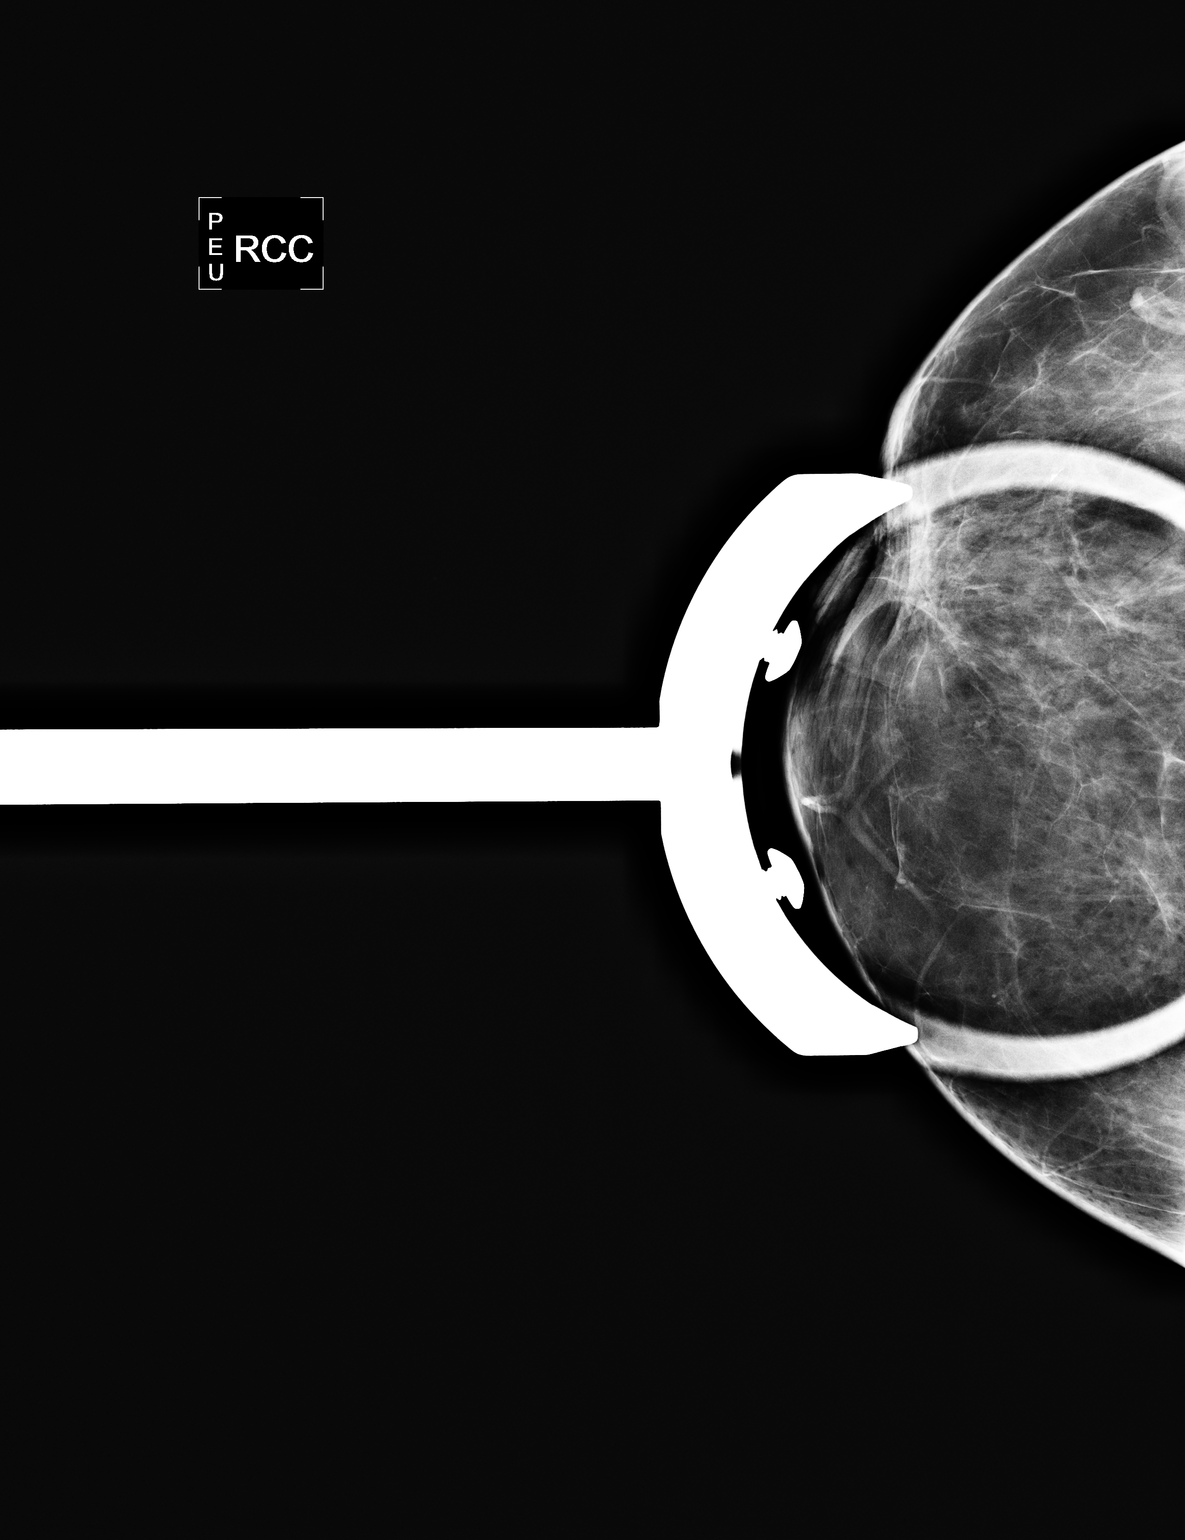

[R MLO]
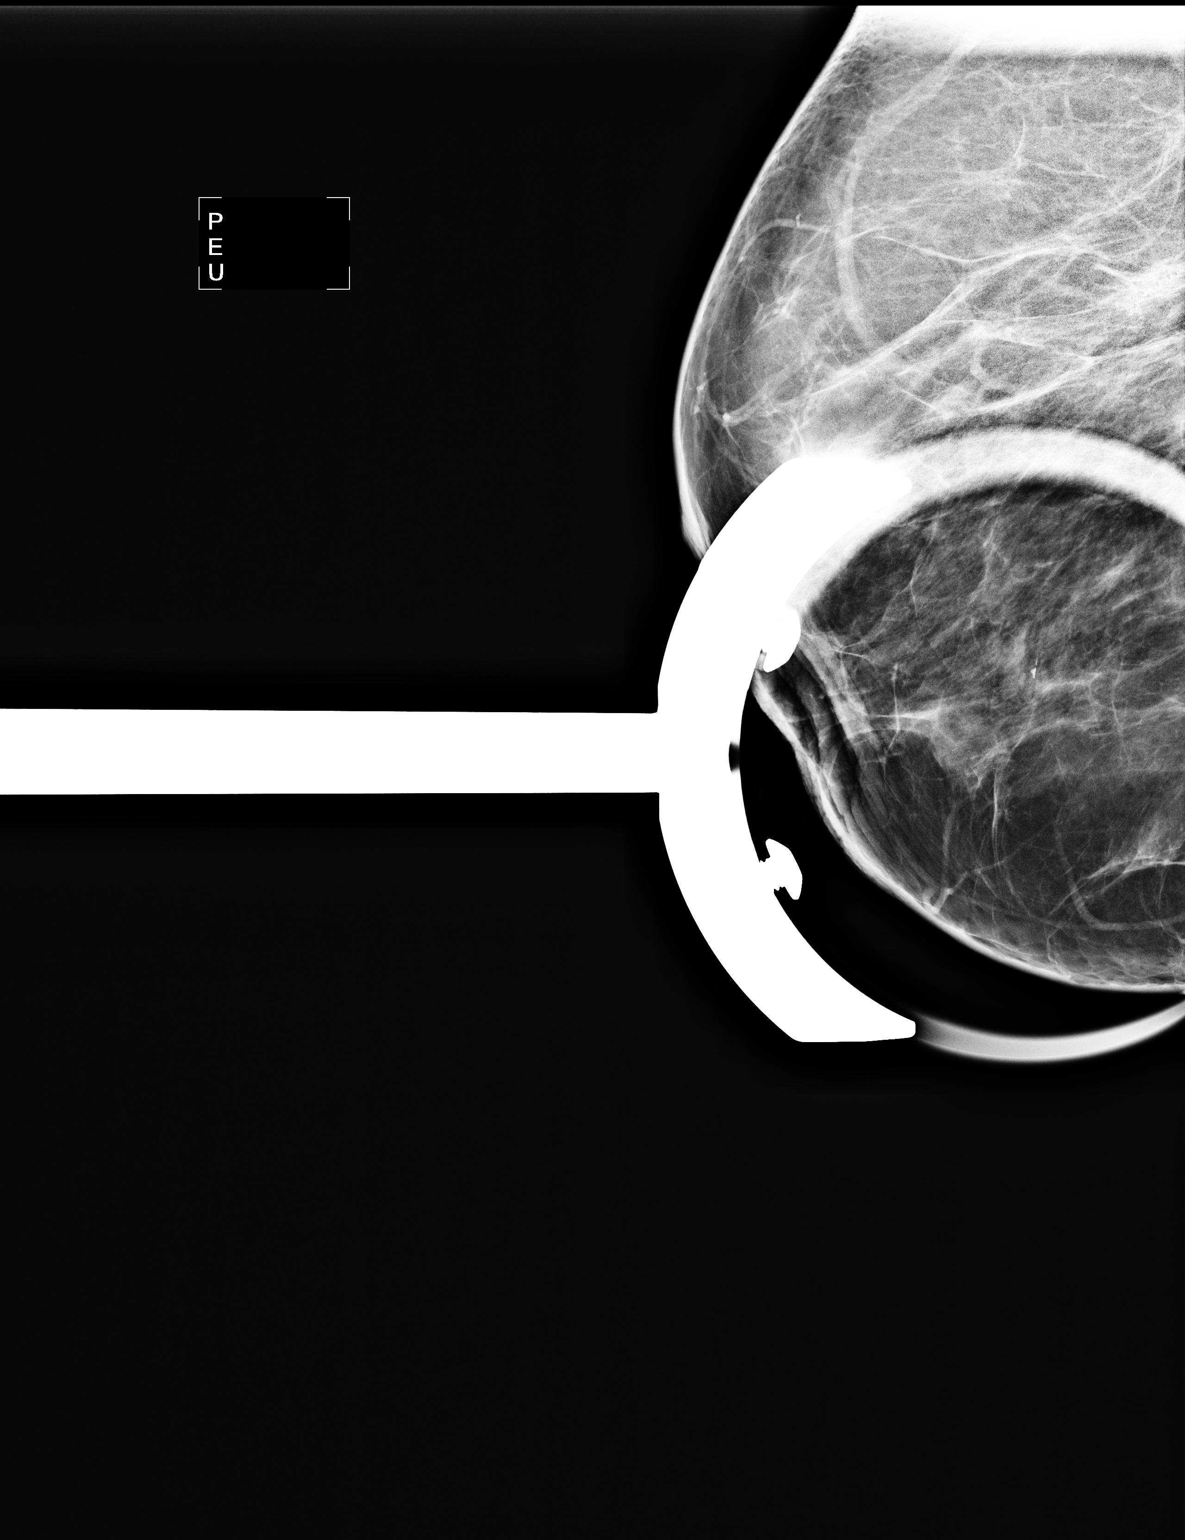

[2 of 2 positions shown; findings below may reference images not displayed]

FINDINGS: Spot compression views of the medial right breast
demonstrate the area of asymmetry located medially within the right
breast to be most consistent with asymmetrical fibroglandular
tissue.

On physical exam, there is no discrete palpable abnormality within
the lateral left breast or medial right breast.

Ultrasound is performed, showing normal-appearing fibroglandular
tissue within the medial right breast.  Ultrasound the upper-outer
quadrant of the left breast was performed in an area of probable
intramammary lymph nodes noted in this region on mammography.  This
demonstrated two benign appearing intramammary lymph nodes located
within the upper-outer quadrant of the left breast.  One lymph node
is located at the 2 o'clock position 6 cm from nipple and measures
6 mm in size.  The second lymph node is located 1 o'clock position
within the left breast 9 cm from nipple and measures 7 mm in size.
These are felt to represent probably benign intramammary lymph
nodes.
IMPRESSION: 1.  Mildly asymmetry within the medial right breast and probable
benign left breast intramammary lymph nodes.  Recommend follow-up
bilateral diagnostic mammography and left breast ultrasound ( if
felt needed)  in 6 months.

BI-RADS CATEGORY 3:  Probably benign finding(s) - short interval
follow-up suggested.

## 2012-10-05 ENCOUNTER — Ambulatory Visit (INDEPENDENT_AMBULATORY_CARE_PROVIDER_SITE_OTHER): Payer: Medicaid Other | Admitting: Advanced Practice Midwife

## 2012-10-05 VITALS — BP 117/75 | Temp 98.3°F | Wt 182.6 lb

## 2012-10-05 DIAGNOSIS — O24419 Gestational diabetes mellitus in pregnancy, unspecified control: Secondary | ICD-10-CM

## 2012-10-05 DIAGNOSIS — O99810 Abnormal glucose complicating pregnancy: Secondary | ICD-10-CM

## 2012-10-05 LAB — POCT URINALYSIS DIP (DEVICE)
Glucose, UA: NEGATIVE mg/dL
Nitrite: NEGATIVE
Protein, ur: NEGATIVE mg/dL
Specific Gravity, Urine: 1.025 (ref 1.005–1.030)
Urobilinogen, UA: 0.2 mg/dL (ref 0.0–1.0)

## 2012-10-05 NOTE — Progress Notes (Signed)
Fasting CBGs 78-90, 2 hour PC all in range except three 135-165. Now knows what food to avoid. Denies UC's. Pressure C/W previous pregnancies. Declined VE, PTL precautions.

## 2012-10-05 NOTE — Progress Notes (Signed)
Pulse 96 Patient reports pelvic pressure and some lower back pain

## 2012-10-13 ENCOUNTER — Ambulatory Visit (HOSPITAL_COMMUNITY)
Admission: RE | Admit: 2012-10-13 | Discharge: 2012-10-13 | Disposition: A | Discharge status: Home / Self Care | Payer: Medicaid Other | Source: Ambulatory Visit | Attending: Advanced Practice Midwife | Admitting: Advanced Practice Midwife

## 2012-10-13 VITALS — BP 110/65 | HR 100 | Wt 186.8 lb

## 2012-10-13 DIAGNOSIS — O09529 Supervision of elderly multigravida, unspecified trimester: Secondary | ICD-10-CM | POA: Insufficient documentation

## 2012-10-13 DIAGNOSIS — E663 Overweight: Secondary | ICD-10-CM

## 2012-10-13 DIAGNOSIS — O24419 Gestational diabetes mellitus in pregnancy, unspecified control: Secondary | ICD-10-CM

## 2012-10-13 DIAGNOSIS — O99810 Abnormal glucose complicating pregnancy: Secondary | ICD-10-CM | POA: Insufficient documentation

## 2012-10-13 DIAGNOSIS — O09299 Supervision of pregnancy with other poor reproductive or obstetric history, unspecified trimester: Secondary | ICD-10-CM | POA: Insufficient documentation

## 2012-11-02 ENCOUNTER — Ambulatory Visit (INDEPENDENT_AMBULATORY_CARE_PROVIDER_SITE_OTHER): Payer: Medicaid Other | Admitting: Family Medicine

## 2012-11-02 ENCOUNTER — Encounter: Payer: Self-pay | Admitting: Family Medicine

## 2012-11-02 VITALS — BP 120/68 | Temp 97.2°F | Wt 184.0 lb

## 2012-11-02 DIAGNOSIS — O24419 Gestational diabetes mellitus in pregnancy, unspecified control: Secondary | ICD-10-CM

## 2012-11-02 DIAGNOSIS — O0990 Supervision of high risk pregnancy, unspecified, unspecified trimester: Secondary | ICD-10-CM

## 2012-11-02 DIAGNOSIS — O26899 Other specified pregnancy related conditions, unspecified trimester: Secondary | ICD-10-CM

## 2012-11-02 DIAGNOSIS — O99810 Abnormal glucose complicating pregnancy: Secondary | ICD-10-CM

## 2012-11-02 DIAGNOSIS — R7989 Other specified abnormal findings of blood chemistry: Secondary | ICD-10-CM

## 2012-11-02 DIAGNOSIS — O360990 Maternal care for other rhesus isoimmunization, unspecified trimester, not applicable or unspecified: Secondary | ICD-10-CM

## 2012-11-02 LAB — HEPATITIS B SURFACE ANTIGEN: Hepatitis B Surface Ag: NEGATIVE

## 2012-11-02 LAB — POCT URINALYSIS DIP (DEVICE)
Glucose, UA: NEGATIVE mg/dL
Hgb urine dipstick: NEGATIVE
Leukocytes, UA: NEGATIVE
Nitrite: NEGATIVE
Urobilinogen, UA: 0.2 mg/dL (ref 0.0–1.0)

## 2012-11-02 LAB — COMPREHENSIVE METABOLIC PANEL
ALT: 64 U/L — ABNORMAL HIGH (ref 0–35)
Alkaline Phosphatase: 45 U/L (ref 39–117)
CO2: 22 mEq/L (ref 19–32)
Potassium: 3.5 mEq/L (ref 3.5–5.3)
Sodium: 136 mEq/L (ref 135–145)
Total Bilirubin: 0.2 mg/dL — ABNORMAL LOW (ref 0.3–1.2)
Total Protein: 5.6 g/dL — ABNORMAL LOW (ref 6.0–8.3)

## 2012-11-02 LAB — CBC
MCH: 24.5 pg — ABNORMAL LOW (ref 26.0–34.0)
MCHC: 32.3 g/dL (ref 30.0–36.0)
Platelets: 189 10*3/uL (ref 150–400)
RBC: 4.58 MIL/uL (ref 3.87–5.11)

## 2012-11-02 MED ORDER — RHO D IMMUNE GLOBULIN 1500 UNIT/2ML IJ SOLN
300.0000 ug | Freq: Once | INTRAMUSCULAR | Status: AC
  Administered 2012-11-02: 300 ug via INTRAMUSCULAR

## 2012-11-02 MED ORDER — TETANUS-DIPHTH-ACELL PERTUSSIS 5-2.5-18.5 LF-MCG/0.5 IM SUSP
0.5000 mL | Freq: Once | INTRAMUSCULAR | Status: AC
  Administered 2012-11-02: 0.5 mL via INTRAMUSCULAR

## 2012-11-02 NOTE — Patient Instructions (Signed)
Pregnancy - Third Trimester The third trimester of pregnancy (the last 3 months) is a period of the most rapid growth for you and your baby. The baby approaches a length of 20 inches and a weight of 6 to 10 pounds. The baby is adding on fat and getting ready for life outside your body. While inside, babies have periods of sleeping and waking, suck their thumbs, and hiccups. You can often feel small contractions of the uterus. This is false labor. It is also called Braxton-Hicks contractions. This is like a practice for labor. The usual problems in this stage of pregnancy include more difficulty breathing, swelling of the hands and feet from water retention, and having to urinate more often because of the uterus and baby pressing on your bladder.  PRENATAL EXAMS  Blood work may continue to be done during prenatal exams. These tests are done to check on your health and the probable health of your baby. Blood work is used to follow your blood levels (hemoglobin). Anemia (low hemoglobin) is common during pregnancy. Iron and vitamins are given to help prevent this. You may also continue to be checked for diabetes. Some of the past blood tests may be done again.  The size of the uterus is measured during each visit. This makes sure your baby is growing properly according to your pregnancy dates.  Your blood pressure is checked every prenatal visit. This is to make sure you are not getting toxemia.  Your urine is checked every prenatal visit for infection, diabetes and protein.  Your weight is checked at each visit. This is done to make sure gains are happening at the suggested rate and that you and your baby are growing normally.  Sometimes, an ultrasound is performed to confirm the position and the proper growth and development of the baby. This is a test done that bounces harmless sound waves off the baby so your caregiver can more accurately determine due dates.  Discuss the type of pain medication  and anesthesia you will have during your labor and delivery.  Discuss the possibility and anesthesia if a Cesarean Section might be necessary.  Inform your caregiver if there is any mental or physical violence at home. Sometimes, a specialized non-stress test, contraction stress test and biophysical profile are done to make sure the baby is not having a problem. Checking the amniotic fluid surrounding the baby is called an amniocentesis. The amniotic fluid is removed by sticking a needle into the belly (abdomen). This is sometimes done near the end of pregnancy if an early delivery is required. In this case, it is done to help make sure the baby's lungs are mature enough for the baby to live outside of the womb. If the lungs are not mature and it is unsafe to deliver the baby, an injection of cortisone medication is given to the mother 1 to 2 days before the delivery. This helps the baby's lungs mature and makes it safer to deliver the baby. CHANGES OCCURING IN THE THIRD TRIMESTER OF PREGNANCY Your body goes through many changes during pregnancy. They vary from person to person. Talk to your caregiver about changes you notice and are concerned about.  During the last trimester, you have probably had an increase in your appetite. It is normal to have cravings for certain foods. This varies from person to person and pregnancy to pregnancy.  You may begin to get stretch marks on your hips, abdomen, and breasts. These are normal changes in the body   during pregnancy. There are no exercises or medications to take which prevent this change.  Constipation may be treated with a stool softener or adding bulk to your diet. Drinking lots of fluids, fiber in vegetables, fruits, and whole grains are helpful.  Exercising is also helpful. If you have been very active up until your pregnancy, most of these activities can be continued during your pregnancy. If you have been less active, it is helpful to start an  exercise program such as walking. Consult your caregiver before starting exercise programs.  Avoid all smoking, alcohol, un-prescribed drugs, herbs and "street drugs" during your pregnancy. These chemicals affect the formation and growth of the baby. Avoid chemicals throughout the pregnancy to ensure the delivery of a healthy infant.  Backache, varicose veins and hemorrhoids may develop or get worse.  You will tire more easily in the third trimester, which is normal.  The baby's movements may be stronger and more often.  You may become short of breath easily.  Your belly button may stick out.  A yellow discharge may leak from your breasts called colostrum.  You may have a bloody mucus discharge. This usually occurs a few days to a week before labor begins. HOME CARE INSTRUCTIONS   Keep your caregiver's appointments. Follow your caregiver's instructions regarding medication use, exercise, and diet.  During pregnancy, you are providing food for you and your baby. Continue to eat regular, well-balanced meals. Choose foods such as meat, fish, milk and other low fat dairy products, vegetables, fruits, and whole-grain breads and cereals. Your caregiver will tell you of the ideal weight gain.  A physical sexual relationship may be continued throughout pregnancy if there are no other problems such as early (premature) leaking of amniotic fluid from the membranes, vaginal bleeding, or belly (abdominal) pain.  Exercise regularly if there are no restrictions. Check with your caregiver if you are unsure of the safety of your exercises. Greater weight gain will occur in the last 2 trimesters of pregnancy. Exercising helps:  Control your weight.  Get you in shape for labor and delivery.  You lose weight after you deliver.  Rest a lot with legs elevated, or as needed for leg cramps or low back pain.  Wear a good support or jogging bra for breast tenderness during pregnancy. This may help if worn  during sleep. Pads or tissues may be used in the bra if you are leaking colostrum.  Do not use hot tubs, steam rooms, or saunas.  Wear your seat belt when driving. This protects you and your baby if you are in an accident.  Avoid raw meat, cat litter boxes and soil used by cats. These carry germs that can cause birth defects in the baby.  It is easier to loose urine during pregnancy. Tightening up and strengthening the pelvic muscles will help with this problem. You can practice stopping your urination while you are going to the bathroom. These are the same muscles you need to strengthen. It is also the muscles you would use if you were trying to stop from passing gas. You can practice tightening these muscles up 10 times a set and repeating this about 3 times per day. Once you know what muscles to tighten up, do not perform these exercises during urination. It is more likely to cause an infection by backing up the urine.  Ask for help if you have financial, counseling or nutritional needs during pregnancy. Your caregiver will be able to offer counseling for these   needs as well as refer you for other special needs.  Make a list of emergency phone numbers and have them available.  Plan on getting help from family or friends when you go home from the hospital.  Make a trial run to the hospital.  Take prenatal classes with the father to understand, practice and ask questions about the labor and delivery.  Prepare the baby's room/nursery.  Do not travel out of the city unless it is absolutely necessary and with the advice of your caregiver.  Wear only low or no heal shoes to have better balance and prevent falling. MEDICATIONS AND DRUG USE IN PREGNANCY  Take prenatal vitamins as directed. The vitamin should contain 1 milligram of folic acid. Keep all vitamins out of reach of children. Only a couple vitamins or tablets containing iron may be fatal to a baby or young child when  ingested.  Avoid use of all medications, including herbs, over-the-counter medications, not prescribed or suggested by your caregiver. Only take over-the-counter or prescription medicines for pain, discomfort, or fever as directed by your caregiver. Do not use aspirin, ibuprofen (Motrin, Advil, Nuprin) or naproxen (Aleve) unless OK'd by your caregiver.  Let your caregiver also know about herbs you may be using.  Alcohol is related to a number of birth defects. This includes fetal alcohol syndrome. All alcohol, in any form, should be avoided completely. Smoking will cause low birth rate and premature babies.  Street/illegal drugs are very harmful to the baby. They are absolutely forbidden. A baby born to an addicted mother will be addicted at birth. The baby will go through the same withdrawal an adult does. SEEK MEDICAL CARE IF: You have any concerns or worries during your pregnancy. It is better to call with your questions if you feel they cannot wait, rather than worry about them. DECISIONS ABOUT CIRCUMCISION You may or may not know the sex of your baby. If you know your baby is a boy, it may be time to think about circumcision. Circumcision is the removal of the foreskin of the penis. This is the skin that covers the sensitive end of the penis. There is no proven medical need for this. Often this decision is made on what is popular at the time or based upon religious beliefs and social issues. You can discuss these issues with your caregiver or pediatrician. SEEK IMMEDIATE MEDICAL CARE IF:   An unexplained oral temperature above 102 F (38.9 C) develops, or as your caregiver suggests.  You have leaking of fluid from the vagina (birth canal). If leaking membranes are suspected, take your temperature and tell your caregiver of this when you call.  There is vaginal spotting, bleeding or passing clots. Tell your caregiver of the amount and how many pads are used.  You develop a bad smelling  vaginal discharge with a change in the color from clear to white.  You develop vomiting that lasts more than 24 hours.  You develop chills or fever.  You develop shortness of breath.  You develop burning on urination.  You loose more than 2 pounds of weight or gain more than 2 pounds of weight or as suggested by your caregiver.  You notice sudden swelling of your face, hands, and feet or legs.  You develop belly (abdominal) pain. Round ligament discomfort is a common non-cancerous (benign) cause of abdominal pain in pregnancy. Your caregiver still must evaluate you.  You develop a severe headache that does not go away.  You develop visual   problems, blurred or double vision.  If you have not felt your baby move for more than 1 hour. If you think the baby is not moving as much as usual, eat something with sugar in it and lie down on your left side for an hour. The baby should move at least 4 to 5 times per hour. Call right away if your baby moves less than that.  You fall, are in a car accident or any kind of trauma.  There is mental or physical violence at home. Document Released: 11/26/2001 Document Revised: 02/24/2012 Document Reviewed: 05/31/2009 [employer] Patient Information 2013 [employer].  Contraception Choices Contraception (birth control) is the use of any methods or devices to prevent pregnancy. Below are some methods to help avoid pregnancy. HORMONAL METHODS   Contraceptive implant. This is a thin, plastic tube containing progesterone hormone. It does not contain estrogen hormone. Your caregiver inserts the tube in the inner part of the upper arm. The tube can remain in place for up to 3 years. After 3 years, the implant must be removed. The implant prevents the ovaries from releasing an egg (ovulation), thickens the cervical mucus which prevents sperm from entering the uterus, and thins the lining of the inside of the uterus.  Progesterone-only injections. These  injections are given every 3 months by your caregiver to prevent pregnancy. This synthetic progesterone hormone stops the ovaries from releasing eggs. It also thickens cervical mucus and changes the uterine lining. This makes it harder for sperm to survive in the uterus.  Birth control pills. These pills contain estrogen and progesterone hormone. They work by stopping the egg from forming in the ovary (ovulation). Birth control pills are prescribed by a caregiver.Birth control pills can also be used to treat heavy periods.  Minipill. This type of birth control pill contains only the progesterone hormone. They are taken every day of each month and must be prescribed by your caregiver.  Birth control patch. The patch contains hormones similar to those in birth control pills. It must be changed once a week and is prescribed by a caregiver.  Vaginal ring. The ring contains hormones similar to those in birth control pills. It is left in the vagina for 3 weeks, removed for 1 week, and then a new one is put back in place. The patient must be comfortable inserting and removing the ring from the vagina.A caregiver's prescription is necessary.  Emergency contraception. Emergency contraceptives prevent pregnancy after unprotected sexual intercourse. This pill can be taken right after sex or up to 5 days after unprotected sex. It is most effective the sooner you take the pills after having sexual intercourse. Emergency contraceptive pills are available without a prescription. Check with your pharmacist. Do not use emergency contraception as your only form of birth control. BARRIER METHODS   Female condom. This is a thin sheath (latex or rubber) that is worn over the penis during sexual intercourse. It can be used with spermicide to increase effectiveness.  Female condom. This is a soft, loose-fitting sheath that is put into the vagina before sexual intercourse.  Diaphragm. This is a soft, latex, dome-shaped  barrier that must be fitted by a caregiver. It is inserted into the vagina, along with a spermicidal jelly. It is inserted before intercourse. The diaphragm should be left in the vagina for 6 to 8 hours after intercourse.  Cervical cap. This is a round, soft, latex or plastic cup that fits over the cervix and must be fitted by a   caregiver. The cap can be left in place for up to 48 hours after intercourse.  Sponge. This is a soft, circular piece of polyurethane foam. The sponge has spermicide in it. It is inserted into the vagina after wetting it and before sexual intercourse.  Spermicides. These are chemicals that kill or block sperm from entering the cervix and uterus. They come in the form of creams, jellies, suppositories, foam, or tablets. They do not require a prescription. They are inserted into the vagina with an applicator before having sexual intercourse. The process must be repeated every time you have sexual intercourse. INTRAUTERINE CONTRACEPTION  Intrauterine device (IUD). This is a T-shaped device that is put in a woman's uterus during a menstrual period to prevent pregnancy. There are 2 types:  Copper IUD. This type of IUD is wrapped in copper wire and is placed inside the uterus. Copper makes the uterus and fallopian tubes produce a fluid that kills sperm. It can stay in place for 10 years.  Hormone IUD. This type of IUD contains the hormone progestin (synthetic progesterone). The hormone thickens the cervical mucus and prevents sperm from entering the uterus, and it also thins the uterine lining to prevent implantation of a fertilized egg. The hormone can weaken or kill the sperm that get into the uterus. It can stay in place for 5 years. PERMANENT METHODS OF CONTRACEPTION  Female tubal ligation. This is when the woman's fallopian tubes are surgically sealed, tied, or blocked to prevent the egg from traveling to the uterus.  Female sterilization. This is when the female has the tubes  that carry sperm tied off (vasectomy).This blocks sperm from entering the vagina during sexual intercourse. After the procedure, the man can still ejaculate fluid (semen). NATURAL PLANNING METHODS  Natural family planning. This is not having sexual intercourse or using a barrier method (condom, diaphragm, cervical cap) on days the woman could become pregnant.  Calendar method. This is keeping track of the length of each menstrual cycle and identifying when you are fertile.  Ovulation method. This is avoiding sexual intercourse during ovulation.  Symptothermal method. This is avoiding sexual intercourse during ovulation, using a thermometer and ovulation symptoms.  Post-ovulation method. This is timing sexual intercourse after you have ovulated. Regardless of which type or method of contraception you choose, it is important that you use condoms to protect against the transmission of sexually transmitted diseases (STDs). Talk with your caregiver about which form of contraception is most appropriate for you. Document Released: 12/02/2005 Document Revised: 02/24/2012 Document Reviewed: 04/10/2011 [employer] Patient Information 2013 [employer].  Breastfeeding Deciding to breastfeed is one of the best choices you can make for you and your baby. The information that follows gives a brief overview of the benefits of breastfeeding as well as common topics surrounding breastfeeding. BENEFITS OF BREASTFEEDING For the baby  The first milk (colostrum) helps the baby's digestive system function better.   There are antibodies in the mother's milk that help the baby fight off infections.   The baby has a lower incidence of asthma, allergies, and sudden infant death syndrome (SIDS).   The nutrients in breast milk are better for the baby than infant formulas, and breast milk helps the baby's brain grow better.   Babies who breastfeed have less gas, colic, and constipation.  For the  mother  Breastfeeding helps develop a very special bond between the mother and her baby.   Breastfeeding is convenient, always available at the correct temperature, and costs nothing.     Breastfeeding burns calories in the mother and helps her lose weight that was gained during pregnancy.   Breastfeeding makes the uterus contract back down to normal size faster and slows bleeding following delivery.   Breastfeeding mothers have a lower risk of developing breast cancer.  BREASTFEEDING FREQUENCY  A healthy, full-term baby may breastfeed as often as every hour or space his or her feedings to every 3 hours.   Watch your baby for signs of hunger. Nurse your baby if he or she shows signs of hunger. How often you nurse will vary from baby to baby.   Nurse as often as the baby requests, or when you feel the need to reduce the fullness of your breasts.   Awaken the baby if it has been 3 4 hours since the last feeding.   Frequent feeding will help the mother make more milk and will help prevent problems, such as sore nipples and engorgement of the breasts.  BABY'S POSITION AT THE BREAST  Whether lying down or sitting, be sure that the baby's tummy is facing your tummy.   Support the breast with 4 fingers underneath the breast and the thumb above. Make sure your fingers are well away from the nipple and baby's mouth.   Stroke the baby's lips gently with your finger or nipple.   When the baby's mouth is open wide enough, place all of your nipple and as much of the areola as possible into your baby's mouth.   Pull the baby in close so the tip of the nose and the baby's cheeks touch the breast during the feeding.  FEEDINGS AND SUCTION  The length of each feeding varies from baby to baby and from feeding to feeding.   The baby must suck about 2 3 minutes for your milk to get to him or her. This is called a "let down." For this reason, allow the baby to feed on each breast as long  as he or she wants. Your baby will end the feeding when he or she has received the right balance of nutrients.   To break the suction, put your finger into the corner of the baby's mouth and slide it between his or her gums before removing your breast from his or her mouth. This will help prevent sore nipples.  HOW TO TELL WHETHER YOUR BABY IS GETTING ENOUGH BREAST MILK. Wondering whether or not your baby is getting enough milk is a common concern among mothers. You can be assured that your baby is getting enough milk if:   Your baby is actively sucking and you hear swallowing.   Your baby seems relaxed and satisfied after a feeding.   Your baby nurses at least 8 12 times in a 24 hour time period. Nurse your baby until he or she unlatches or falls asleep at the first breast (at least 10 20 minutes), then offer the second side.   Your baby is wetting 5 6 disposable diapers (6 8 cloth diapers) in a 24 hour period by 5 6 days of age.   Your baby is having at least 3 4 stools every 24 hours for the first 6 weeks. The stool should be soft and yellow.   Your baby should gain 4 7 ounces per week after he or she is 4 days old.   Your breasts feel softer after nursing.  REDUCING BREAST ENGORGEMENT  In the first week after your baby is born, you may experience signs of breast engorgement. When breasts   are engorged, they feel heavy, warm, full, and may be tender to the touch. You can reduce engorgement if you:   Nurse frequently, every 2 3 hours. Mothers who breastfeed early and often have fewer problems with engorgement.   Place light ice packs on your breasts for 10 20 minutes between feedings. This reduces swelling. Wrap the ice packs in a lightweight towel to protect your skin. Bags of frozen vegetables work well for this purpose.   Take a warm shower or apply warm, moist heat to your breast for 5 10 minutes just before each feeding. This increases circulation and helps the milk flow.    Gently massage your breast before and during the feeding. Using your finger tips, massage from the chest wall towards your nipple in a circular motion.   Make sure that the baby empties at least one breast at every feeding before switching sides.   Use a breast pump to empty the breasts if your baby is sleepy or not nursing well. You may also want to pump if you are returning to work oryou feel you are getting engorged.   Avoid bottle feeds, pacifiers, or supplemental feedings of water or juice in place of breastfeeding. Breast milk is all the food your baby needs. It is not necessary for your baby to have water or formula. In fact, to help your breasts make more milk, it is best not to give your baby supplemental feedings during the early weeks.   Be sure the baby is latched on and positioned properly while breastfeeding.   Wear a supportive bra, avoiding underwire styles.   Eat a balanced diet with enough fluids.   Rest often, relax, and take your prenatal vitamins to prevent fatigue, stress, and anemia.  If you follow these suggestions, your engorgement should improve in 24 48 hours. If you are still experiencing difficulty, call your lactation consultant or caregiver.  CARING FOR YOURSELF Take care of your breasts  Bathe or shower daily.   Avoid using soap on your nipples.   Start feedings on your left breast at one feeding and on your right breast at the next feeding.   You will notice an increase in your milk supply 2 5 days after delivery. You may feel some discomfort from engorgement, which makes your breasts very firm and often tender. Engorgement "peaks" out within 24 48 hours. In the meantime, apply warm moist towels to your breasts for 5 10 minutes before feeding. Gentle massage and expression of some milk before feeding will soften your breasts, making it easier for your baby to latch on.   Wear a well-fitting nursing bra, and air dry your nipples for a 3  4minutes after each feeding.   Only use cotton bra pads.   Only use pure lanolin on your nipples after nursing. You do not need to wash it off before feeding the baby again. Another option is to express a few drops of breast milk and gently massage it into your nipples.  Take care of yourself  Eat well-balanced meals and nutritious snacks.   Drinking milk, fruit juice, and water to satisfy your thirst (about 8 glasses a day).   Get plenty of rest.  Avoid foods that you notice affect the baby in a bad way.  SEEK MEDICAL CARE IF:   You have difficulty with breastfeeding and need help.   You have a hard, red, sore area on your breast that is accompanied by a fever.   Your baby is   too sleepy to eat well or is having trouble sleeping.   Your baby is wetting less than 6 diapers a day, by 5 days of age.   Your baby's skin or white part of his or her eyes is more yellow than it was in the hospital.   You feel depressed.  Document Released: 12/02/2005 Document Revised: 06/02/2012 Document Reviewed: 03/01/2012 [employer] Patient Information 2013 [employer].  

## 2012-11-02 NOTE — Progress Notes (Signed)
Pulse 97. Patient states that she has fallen down her set of stairs at home yesterday; c/o pain on right hip and left thigh. C/o pressure in pelvic and vaginal area. Vaginal d/c stated as thin white; no odor, no itch.

## 2012-11-02 NOTE — Progress Notes (Signed)
Notes requested from [facility] visit.

## 2012-11-02 NOTE — Progress Notes (Signed)
FBS 71-86 2hr pp 80-123 one out of range Last u/s 10/29--51%-has f/u scheduled with MFM 28 wk labs, check LFT's, Hep panel Rhogam and TDaP today

## 2012-11-03 LAB — ANTIBODY SCREEN: Antibody Screen: NEGATIVE

## 2012-11-10 ENCOUNTER — Ambulatory Visit (HOSPITAL_COMMUNITY)
Admission: RE | Admit: 2012-11-10 | Discharge: 2012-11-10 | Disposition: A | Discharge status: Home / Self Care | Payer: Medicaid Other | Source: Ambulatory Visit | Attending: Obstetrics & Gynecology | Admitting: Obstetrics & Gynecology

## 2012-11-10 VITALS — BP 136/70 | HR 100 | Wt 186.0 lb

## 2012-11-10 DIAGNOSIS — O09299 Supervision of pregnancy with other poor reproductive or obstetric history, unspecified trimester: Secondary | ICD-10-CM | POA: Insufficient documentation

## 2012-11-10 DIAGNOSIS — O24419 Gestational diabetes mellitus in pregnancy, unspecified control: Secondary | ICD-10-CM

## 2012-11-10 DIAGNOSIS — E663 Overweight: Secondary | ICD-10-CM

## 2012-11-10 DIAGNOSIS — O09529 Supervision of elderly multigravida, unspecified trimester: Secondary | ICD-10-CM | POA: Insufficient documentation

## 2012-11-10 DIAGNOSIS — O99810 Abnormal glucose complicating pregnancy: Secondary | ICD-10-CM | POA: Insufficient documentation

## 2012-11-16 ENCOUNTER — Encounter: Payer: Self-pay | Admitting: Obstetrics & Gynecology

## 2012-11-16 ENCOUNTER — Ambulatory Visit (INDEPENDENT_AMBULATORY_CARE_PROVIDER_SITE_OTHER): Payer: Medicaid Other | Admitting: Obstetrics & Gynecology

## 2012-11-16 VITALS — BP 119/65 | Temp 97.0°F | Wt 185.1 lb

## 2012-11-16 DIAGNOSIS — O24419 Gestational diabetes mellitus in pregnancy, unspecified control: Secondary | ICD-10-CM

## 2012-11-16 DIAGNOSIS — O09529 Supervision of elderly multigravida, unspecified trimester: Secondary | ICD-10-CM

## 2012-11-16 DIAGNOSIS — E663 Overweight: Secondary | ICD-10-CM

## 2012-11-16 DIAGNOSIS — O99810 Abnormal glucose complicating pregnancy: Secondary | ICD-10-CM

## 2012-11-16 DIAGNOSIS — Z6825 Body mass index (BMI) 25.0-25.9, adult: Secondary | ICD-10-CM

## 2012-11-16 LAB — POCT URINALYSIS DIP (DEVICE)
Bilirubin Urine: NEGATIVE
Glucose, UA: NEGATIVE mg/dL
Hgb urine dipstick: NEGATIVE
Ketones, ur: NEGATIVE mg/dL
Nitrite: NEGATIVE
Specific Gravity, Urine: 1.015 (ref 1.005–1.030)
pH: 6.5 (ref 5.0–8.0)

## 2012-11-16 NOTE — Progress Notes (Signed)
Routine visit. Good FM. No OB problems. She got her rhophylac 11-02-12. Her recent growth u/s was 45% for growth. She has another one scheduled in 3 weeks. She has normal sugars all day but has had some 50s in the middle of the night. I have suggested that she not eat her supper until 9:30pm and then goes to bed at 10:30. I have suggested that she eat earlier.

## 2012-11-16 NOTE — Progress Notes (Signed)
Pulse- 101 

## 2012-11-18 ENCOUNTER — Other Ambulatory Visit: Payer: Self-pay | Admitting: Obstetrics & Gynecology

## 2012-11-18 DIAGNOSIS — O09529 Supervision of elderly multigravida, unspecified trimester: Secondary | ICD-10-CM

## 2012-11-30 ENCOUNTER — Ambulatory Visit (INDEPENDENT_AMBULATORY_CARE_PROVIDER_SITE_OTHER): Payer: Medicaid Other | Admitting: Family

## 2012-11-30 VITALS — BP 113/72 | Temp 97.0°F | Wt 186.7 lb

## 2012-11-30 DIAGNOSIS — O34219 Maternal care for unspecified type scar from previous cesarean delivery: Secondary | ICD-10-CM | POA: Insufficient documentation

## 2012-11-30 DIAGNOSIS — O26899 Other specified pregnancy related conditions, unspecified trimester: Secondary | ICD-10-CM

## 2012-11-30 DIAGNOSIS — O360990 Maternal care for other rhesus isoimmunization, unspecified trimester, not applicable or unspecified: Secondary | ICD-10-CM

## 2012-11-30 DIAGNOSIS — O09529 Supervision of elderly multigravida, unspecified trimester: Secondary | ICD-10-CM

## 2012-11-30 DIAGNOSIS — R7401 Elevation of levels of liver transaminase levels: Secondary | ICD-10-CM

## 2012-11-30 DIAGNOSIS — Z6791 Unspecified blood type, Rh negative: Secondary | ICD-10-CM

## 2012-11-30 DIAGNOSIS — R748 Abnormal levels of other serum enzymes: Secondary | ICD-10-CM

## 2012-11-30 DIAGNOSIS — Z98891 History of uterine scar from previous surgery: Secondary | ICD-10-CM

## 2012-11-30 DIAGNOSIS — Z9889 Other specified postprocedural states: Secondary | ICD-10-CM

## 2012-11-30 LAB — POCT URINALYSIS DIP (DEVICE)
Glucose, UA: NEGATIVE mg/dL
Nitrite: NEGATIVE
Urobilinogen, UA: 0.2 mg/dL (ref 0.0–1.0)

## 2012-11-30 NOTE — Progress Notes (Signed)
Reports intermittent braxton hicks 2x/day; FBS 82-93 (3/12 abnl), B 107-138 (3/12 abnl), L 95-117, D 108-132 (1/9 abnl); no questions or concerns; continue with current plan of care; begin 2xweek testing next week.

## 2012-11-30 NOTE — Progress Notes (Signed)
p-98 

## 2012-12-07 ENCOUNTER — Ambulatory Visit (INDEPENDENT_AMBULATORY_CARE_PROVIDER_SITE_OTHER): Payer: Medicaid Other | Admitting: Family Medicine

## 2012-12-07 ENCOUNTER — Encounter: Payer: Self-pay | Admitting: Family Medicine

## 2012-12-07 VITALS — BP 129/72 | Temp 97.1°F | Wt 186.0 lb

## 2012-12-07 DIAGNOSIS — Z3490 Encounter for supervision of normal pregnancy, unspecified, unspecified trimester: Secondary | ICD-10-CM

## 2012-12-07 DIAGNOSIS — O99810 Abnormal glucose complicating pregnancy: Secondary | ICD-10-CM

## 2012-12-07 DIAGNOSIS — O24419 Gestational diabetes mellitus in pregnancy, unspecified control: Secondary | ICD-10-CM

## 2012-12-07 LAB — POCT URINALYSIS DIP (DEVICE)
Bilirubin Urine: NEGATIVE
Glucose, UA: NEGATIVE mg/dL
Hgb urine dipstick: NEGATIVE
Protein, ur: NEGATIVE mg/dL
Urobilinogen, UA: 0.2 mg/dL (ref 0.0–1.0)
pH: 7 (ref 5.0–8.0)

## 2012-12-07 NOTE — Patient Instructions (Signed)
Pregnancy - Third Trimester The third trimester of pregnancy (the last 3 months) is a period of the most rapid growth for you and your baby. The baby approaches a length of 20 inches and a weight of 6 to 10 pounds. The baby is adding on fat and getting ready for life outside your body. While inside, babies have periods of sleeping and waking, suck their thumbs, and hiccups. You can often feel small contractions of the uterus. This is false labor. It is also called Braxton-Hicks contractions. This is like a practice for labor. The usual problems in this stage of pregnancy include more difficulty breathing, swelling of the hands and feet from water retention, and having to urinate more often because of the uterus and baby pressing on your bladder.  PRENATAL EXAMS  Blood work may continue to be done during prenatal exams. These tests are done to check on your health and the probable health of your baby. Blood work is used to follow your blood levels (hemoglobin). Anemia (low hemoglobin) is common during pregnancy. Iron and vitamins are given to help prevent this. You may also continue to be checked for diabetes. Some of the past blood tests may be done again.  The size of the uterus is measured during each visit. This makes sure your baby is growing properly according to your pregnancy dates.  Your blood pressure is checked every prenatal visit. This is to make sure you are not getting toxemia.  Your urine is checked every prenatal visit for infection, diabetes and protein.  Your weight is checked at each visit. This is done to make sure gains are happening at the suggested rate and that you and your baby are growing normally.  Sometimes, an ultrasound is performed to confirm the position and the proper growth and development of the baby. This is a test done that bounces harmless sound waves off the baby so your caregiver can more accurately determine due dates.  Discuss the type of pain medication  and anesthesia you will have during your labor and delivery.  Discuss the possibility and anesthesia if a Cesarean Section might be necessary.  Inform your caregiver if there is any mental or physical violence at home. Sometimes, a specialized non-stress test, contraction stress test and biophysical profile are done to make sure the baby is not having a problem. Checking the amniotic fluid surrounding the baby is called an amniocentesis. The amniotic fluid is removed by sticking a needle into the belly (abdomen). This is sometimes done near the end of pregnancy if an early delivery is required. In this case, it is done to help make sure the baby's lungs are mature enough for the baby to live outside of the womb. If the lungs are not mature and it is unsafe to deliver the baby, an injection of cortisone medication is given to the mother 1 to 2 days before the delivery. This helps the baby's lungs mature and makes it safer to deliver the baby. CHANGES OCCURING IN THE THIRD TRIMESTER OF PREGNANCY Your body goes through many changes during pregnancy. They vary from person to person. Talk to your caregiver about changes you notice and are concerned about.  During the last trimester, you have probably had an increase in your appetite. It is normal to have cravings for certain foods. This varies from person to person and pregnancy to pregnancy.  You may begin to get stretch marks on your hips, abdomen, and breasts. These are normal changes in the body   during pregnancy. There are no exercises or medications to take which prevent this change.  Constipation may be treated with a stool softener or adding bulk to your diet. Drinking lots of fluids, fiber in vegetables, fruits, and whole grains are helpful.  Exercising is also helpful. If you have been very active up until your pregnancy, most of these activities can be continued during your pregnancy. If you have been less active, it is helpful to start an  exercise program such as walking. Consult your caregiver before starting exercise programs.  Avoid all smoking, alcohol, un-prescribed drugs, herbs and "street drugs" during your pregnancy. These chemicals affect the formation and growth of the baby. Avoid chemicals throughout the pregnancy to ensure the delivery of a healthy infant.  Backache, varicose veins and hemorrhoids may develop or get worse.  You will tire more easily in the third trimester, which is normal.  The baby's movements may be stronger and more often.  You may become short of breath easily.  Your belly button may stick out.  A yellow discharge may leak from your breasts called colostrum.  You may have a bloody mucus discharge. This usually occurs a few days to a week before labor begins. HOME CARE INSTRUCTIONS   Keep your caregiver's appointments. Follow your caregiver's instructions regarding medication use, exercise, and diet.  During pregnancy, you are providing food for you and your baby. Continue to eat regular, well-balanced meals. Choose foods such as meat, fish, milk and other low fat dairy products, vegetables, fruits, and whole-grain breads and cereals. Your caregiver will tell you of the ideal weight gain.  A physical sexual relationship may be continued throughout pregnancy if there are no other problems such as early (premature) leaking of amniotic fluid from the membranes, vaginal bleeding, or belly (abdominal) pain.  Exercise regularly if there are no restrictions. Check with your caregiver if you are unsure of the safety of your exercises. Greater weight gain will occur in the last 2 trimesters of pregnancy. Exercising helps:  Control your weight.  Get you in shape for labor and delivery.  You lose weight after you deliver.  Rest a lot with legs elevated, or as needed for leg cramps or low back pain.  Wear a good support or jogging bra for breast tenderness during pregnancy. This may help if worn  during sleep. Pads or tissues may be used in the bra if you are leaking colostrum.  Do not use hot tubs, steam rooms, or saunas.  Wear your seat belt when driving. This protects you and your baby if you are in an accident.  Avoid raw meat, cat litter boxes and soil used by cats. These carry germs that can cause birth defects in the baby.  It is easier to loose urine during pregnancy. Tightening up and strengthening the pelvic muscles will help with this problem. You can practice stopping your urination while you are going to the bathroom. These are the same muscles you need to strengthen. It is also the muscles you would use if you were trying to stop from passing gas. You can practice tightening these muscles up 10 times a set and repeating this about 3 times per day. Once you know what muscles to tighten up, do not perform these exercises during urination. It is more likely to cause an infection by backing up the urine.  Ask for help if you have financial, counseling or nutritional needs during pregnancy. Your caregiver will be able to offer counseling for these   needs as well as refer you for other special needs.  Make a list of emergency phone numbers and have them available.  Plan on getting help from family or friends when you go home from the hospital.  Make a trial run to the hospital.  Take prenatal classes with the father to understand, practice and ask questions about the labor and delivery.  Prepare the baby's room/nursery.  Do not travel out of the city unless it is absolutely necessary and with the advice of your caregiver.  Wear only low or no heal shoes to have better balance and prevent falling. MEDICATIONS AND DRUG USE IN PREGNANCY  Take prenatal vitamins as directed. The vitamin should contain 1 milligram of folic acid. Keep all vitamins out of reach of children. Only a couple vitamins or tablets containing iron may be fatal to a baby or young child when  ingested.  Avoid use of all medications, including herbs, over-the-counter medications, not prescribed or suggested by your caregiver. Only take over-the-counter or prescription medicines for pain, discomfort, or fever as directed by your caregiver. Do not use aspirin, ibuprofen (Motrin, Advil, Nuprin) or naproxen (Aleve) unless OK'd by your caregiver.  Let your caregiver also know about herbs you may be using.  Alcohol is related to a number of birth defects. This includes fetal alcohol syndrome. All alcohol, in any form, should be avoided completely. Smoking will cause low birth rate and premature babies.  Street/illegal drugs are very harmful to the baby. They are absolutely forbidden. A baby born to an addicted mother will be addicted at birth. The baby will go through the same withdrawal an adult does. SEEK MEDICAL CARE IF: You have any concerns or worries during your pregnancy. It is better to call with your questions if you feel they cannot wait, rather than worry about them. DECISIONS ABOUT CIRCUMCISION You may or may not know the sex of your baby. If you know your baby is a boy, it may be time to think about circumcision. Circumcision is the removal of the foreskin of the penis. This is the skin that covers the sensitive end of the penis. There is no proven medical need for this. Often this decision is made on what is popular at the time or based upon religious beliefs and social issues. You can discuss these issues with your caregiver or pediatrician. SEEK IMMEDIATE MEDICAL CARE IF:   An unexplained oral temperature above 102 F (38.9 C) develops, or as your caregiver suggests.  You have leaking of fluid from the vagina (birth canal). If leaking membranes are suspected, take your temperature and tell your caregiver of this when you call.  There is vaginal spotting, bleeding or passing clots. Tell your caregiver of the amount and how many pads are used.  You develop a bad smelling  vaginal discharge with a change in the color from clear to white.  You develop vomiting that lasts more than 24 hours.  You develop chills or fever.  You develop shortness of breath.  You develop burning on urination.  You loose more than 2 pounds of weight or gain more than 2 pounds of weight or as suggested by your caregiver.  You notice sudden swelling of your face, hands, and feet or legs.  You develop belly (abdominal) pain. Round ligament discomfort is a common non-cancerous (benign) cause of abdominal pain in pregnancy. Your caregiver still must evaluate you.  You develop a severe headache that does not go away.  You develop visual   problems, blurred or double vision.  If you have not felt your baby move for more than 1 hour. If you think the baby is not moving as much as usual, eat something with sugar in it and lie down on your left side for an hour. The baby should move at least 4 to 5 times per hour. Call right away if your baby moves less than that.  You fall, are in a car accident or any kind of trauma.  There is mental or physical violence at home. Document Released: 11/26/2001 Document Revised: 02/24/2012 Document Reviewed: 05/31/2009 [employer] Patient Information 2013 [employer].  Breastfeeding Deciding to breastfeed is one of the best choices you can make for you and your baby. The information that follows gives a brief overview of the benefits of breastfeeding as well as common topics surrounding breastfeeding. BENEFITS OF BREASTFEEDING For the baby  The first milk (colostrum) helps the baby's digestive system function better.   There are antibodies in the mother's milk that help the baby fight off infections.   The baby has a lower incidence of asthma, allergies, and sudden infant death syndrome (SIDS).   The nutrients in breast milk are better for the baby than infant formulas, and breast milk helps the baby's brain grow better.   Babies who  breastfeed have less gas, colic, and constipation.  For the mother  Breastfeeding helps develop a very special bond between the mother and her baby.   Breastfeeding is convenient, always available at the correct temperature, and costs nothing.   Breastfeeding burns calories in the mother and helps her lose weight that was gained during pregnancy.   Breastfeeding makes the uterus contract back down to normal size faster and slows bleeding following delivery.   Breastfeeding mothers have a lower risk of developing breast cancer.  BREASTFEEDING FREQUENCY  A healthy, full-term baby may breastfeed as often as every hour or space his or her feedings to every 3 hours.   Watch your baby for signs of hunger. Nurse your baby if he or she shows signs of hunger. How often you nurse will vary from baby to baby.   Nurse as often as the baby requests, or when you feel the need to reduce the fullness of your breasts.   Awaken the baby if it has been 3 4 hours since the last feeding.   Frequent feeding will help the mother make more milk and will help prevent problems, such as sore nipples and engorgement of the breasts.  BABY'S POSITION AT THE BREAST  Whether lying down or sitting, be sure that the baby's tummy is facing your tummy.   Support the breast with 4 fingers underneath the breast and the thumb above. Make sure your fingers are well away from the nipple and baby's mouth.   Stroke the baby's lips gently with your finger or nipple.   When the baby's mouth is open wide enough, place all of your nipple and as much of the areola as possible into your baby's mouth.   Pull the baby in close so the tip of the nose and the baby's cheeks touch the breast during the feeding.  FEEDINGS AND SUCTION  The length of each feeding varies from baby to baby and from feeding to feeding.   The baby must suck about 2 3 minutes for your milk to get to him or her. This is called a "let down."  For this reason, allow the baby to feed on each breast as   long as he or she wants. Your baby will end the feeding when he or she has received the right balance of nutrients.   To break the suction, put your finger into the corner of the baby's mouth and slide it between his or her gums before removing your breast from his or her mouth. This will help prevent sore nipples.  HOW TO TELL WHETHER YOUR BABY IS GETTING ENOUGH BREAST MILK. Wondering whether or not your baby is getting enough milk is a common concern among mothers. You can be assured that your baby is getting enough milk if:   Your baby is actively sucking and you hear swallowing.   Your baby seems relaxed and satisfied after a feeding.   Your baby nurses at least 8 12 times in a 24 hour time period. Nurse your baby until he or she unlatches or falls asleep at the first breast (at least 10 20 minutes), then offer the second side.   Your baby is wetting 5 6 disposable diapers (6 8 cloth diapers) in a 24 hour period by 5 6 days of age.   Your baby is having at least 3 4 stools every 24 hours for the first 6 weeks. The stool should be soft and yellow.   Your baby should gain 4 7 ounces per week after he or she is 4 days old.   Your breasts feel softer after nursing.  REDUCING BREAST ENGORGEMENT  In the first week after your baby is born, you may experience signs of breast engorgement. When breasts are engorged, they feel heavy, warm, full, and may be tender to the touch. You can reduce engorgement if you:   Nurse frequently, every 2 3 hours. Mothers who breastfeed early and often have fewer problems with engorgement.   Place light ice packs on your breasts for 10 20 minutes between feedings. This reduces swelling. Wrap the ice packs in a lightweight towel to protect your skin. Bags of frozen vegetables work well for this purpose.   Take a warm shower or apply warm, moist heat to your breast for 5 10 minutes just before  each feeding. This increases circulation and helps the milk flow.   Gently massage your breast before and during the feeding. Using your finger tips, massage from the chest wall towards your nipple in a circular motion.   Make sure that the baby empties at least one breast at every feeding before switching sides.   Use a breast pump to empty the breasts if your baby is sleepy or not nursing well. You may also want to pump if you are returning to work oryou feel you are getting engorged.   Avoid bottle feeds, pacifiers, or supplemental feedings of water or juice in place of breastfeeding. Breast milk is all the food your baby needs. It is not necessary for your baby to have water or formula. In fact, to help your breasts make more milk, it is best not to give your baby supplemental feedings during the early weeks.   Be sure the baby is latched on and positioned properly while breastfeeding.   Wear a supportive bra, avoiding underwire styles.   Eat a balanced diet with enough fluids.   Rest often, relax, and take your prenatal vitamins to prevent fatigue, stress, and anemia.  If you follow these suggestions, your engorgement should improve in 24 48 hours. If you are still experiencing difficulty, call your lactation consultant or caregiver.  CARING FOR YOURSELF Take care of your   breasts  Bathe or shower daily.   Avoid using soap on your nipples.   Start feedings on your left breast at one feeding and on your right breast at the next feeding.   You will notice an increase in your milk supply 2 5 days after delivery. You may feel some discomfort from engorgement, which makes your breasts very firm and often tender. Engorgement "peaks" out within 24 48 hours. In the meantime, apply warm moist towels to your breasts for 5 10 minutes before feeding. Gentle massage and expression of some milk before feeding will soften your breasts, making it easier for your baby to latch on.    Wear a well-fitting nursing bra, and air dry your nipples for a 3 4minutes after each feeding.   Only use cotton bra pads.   Only use pure lanolin on your nipples after nursing. You do not need to wash it off before feeding the baby again. Another option is to express a few drops of breast milk and gently massage it into your nipples.  Take care of yourself  Eat well-balanced meals and nutritious snacks.   Drinking milk, fruit juice, and water to satisfy your thirst (about 8 glasses a day).   Get plenty of rest.  Avoid foods that you notice affect the baby in a bad way.  SEEK MEDICAL CARE IF:   You have difficulty with breastfeeding and need help.   You have a hard, red, sore area on your breast that is accompanied by a fever.   Your baby is too sleepy to eat well or is having trouble sleeping.   Your baby is wetting less than 6 diapers a day, by 5 days of age.   Your baby's skin or white part of his or her eyes is more yellow than it was in the hospital.   You feel depressed.  Document Released: 12/02/2005 Document Revised: 06/02/2012 Document Reviewed: 03/01/2012 [employer] Patient Information 2013 [employer].  

## 2012-12-07 NOTE — Progress Notes (Signed)
FBS 83-90 2hr pp 100-124 1 out of range U/S growth tomorrow NST reviewed and reactive. Will schedule C-section

## 2012-12-07 NOTE — Progress Notes (Signed)
Pulse- 103   Edema-feet at the end of the day   Pain/pressure- lower abd

## 2012-12-08 ENCOUNTER — Ambulatory Visit (HOSPITAL_COMMUNITY)
Admission: RE | Admit: 2012-12-08 | Discharge: 2012-12-08 | Disposition: A | Discharge status: Home / Self Care | Payer: Medicaid Other | Source: Ambulatory Visit | Attending: Obstetrics & Gynecology | Admitting: Obstetrics & Gynecology

## 2012-12-08 ENCOUNTER — Other Ambulatory Visit: Payer: Self-pay | Admitting: Maternal and Fetal Medicine

## 2012-12-08 VITALS — BP 113/68 | HR 96 | Wt 188.0 lb

## 2012-12-08 DIAGNOSIS — O09299 Supervision of pregnancy with other poor reproductive or obstetric history, unspecified trimester: Secondary | ICD-10-CM | POA: Insufficient documentation

## 2012-12-08 DIAGNOSIS — O24919 Unspecified diabetes mellitus in pregnancy, unspecified trimester: Secondary | ICD-10-CM

## 2012-12-08 DIAGNOSIS — O09529 Supervision of elderly multigravida, unspecified trimester: Secondary | ICD-10-CM | POA: Insufficient documentation

## 2012-12-08 DIAGNOSIS — O34219 Maternal care for unspecified type scar from previous cesarean delivery: Secondary | ICD-10-CM

## 2012-12-08 DIAGNOSIS — O24419 Gestational diabetes mellitus in pregnancy, unspecified control: Secondary | ICD-10-CM

## 2012-12-08 DIAGNOSIS — O99810 Abnormal glucose complicating pregnancy: Secondary | ICD-10-CM | POA: Insufficient documentation

## 2012-12-10 ENCOUNTER — Encounter: Payer: Self-pay | Admitting: Obstetrics & Gynecology

## 2012-12-10 ENCOUNTER — Ambulatory Visit (INDEPENDENT_AMBULATORY_CARE_PROVIDER_SITE_OTHER): Payer: Medicaid Other | Admitting: *Deleted

## 2012-12-10 VITALS — BP 113/62 | Wt 188.9 lb

## 2012-12-10 DIAGNOSIS — O99810 Abnormal glucose complicating pregnancy: Secondary | ICD-10-CM

## 2012-12-10 DIAGNOSIS — O24419 Gestational diabetes mellitus in pregnancy, unspecified control: Secondary | ICD-10-CM

## 2012-12-10 NOTE — Progress Notes (Signed)
P=93 

## 2012-12-10 NOTE — Progress Notes (Signed)
NST reviewed and reactive.  Jamis Kryder L. Harraway-Smith, M.D., FACOG    

## 2012-12-14 ENCOUNTER — Ambulatory Visit (INDEPENDENT_AMBULATORY_CARE_PROVIDER_SITE_OTHER): Payer: Medicaid Other | Admitting: Family Medicine

## 2012-12-14 VITALS — BP 132/81 | Temp 96.7°F | Wt 187.9 lb

## 2012-12-14 DIAGNOSIS — O24419 Gestational diabetes mellitus in pregnancy, unspecified control: Secondary | ICD-10-CM

## 2012-12-14 DIAGNOSIS — O99810 Abnormal glucose complicating pregnancy: Secondary | ICD-10-CM

## 2012-12-14 LAB — POCT URINALYSIS DIP (DEVICE)
Glucose, UA: NEGATIVE mg/dL
Hgb urine dipstick: NEGATIVE
Specific Gravity, Urine: 1.02 (ref 1.005–1.030)
Urobilinogen, UA: 0.2 mg/dL (ref 0.0–1.0)
pH: 6.5 (ref 5.0–8.0)

## 2012-12-14 NOTE — Progress Notes (Signed)
Pulse- 101 

## 2012-12-14 NOTE — Assessment & Plan Note (Signed)
Doing well 

## 2012-12-14 NOTE — Progress Notes (Signed)
FBS 73-90 2 hr pp 109-136 2 out of range NST reviewed and reactive. U/S 12/24 vtx, AFI 17, EFW 1942 gm 54%

## 2012-12-14 NOTE — Patient Instructions (Addendum)
Gestational Diabetes Mellitus Gestational diabetes mellitus (GDM) is diabetes that occurs only during pregnancy. This happens when the body cannot properly handle the glucose (sugar) that increases in the blood after eating. During pregnancy, insulin resistance (reduced sensitivity to insulin) occurs because of the release of hormones from the placenta. Usually, the pancreas of pregnant women produces enough insulin to overcome the resistance that occurs. However, in gestational diabetes, the insulin is there but it does not work effectively. If the resistance is severe enough that the pancreas does not produce enough insulin, extra glucose builds up in the blood.  WHO IS AT RISK FOR DEVELOPING GESTATIONAL DIABETES?  Women with a history of diabetes in the family.  Women over age 25.  Women who are overweight.  Women in certain ethnic groups (Hispanic, African American, Native American, Asian and Pacific Islander). WHAT CAN HAPPEN TO THE BABY? If the mother's blood glucose is too high while she is pregnant, the extra sugar will travel through the umbilical cord to the baby. Some of the problems the baby may have are:  Large Baby - If the baby receives too much sugar, the baby will gain more weight. This may cause the baby to be too large to be born normally (vaginally) and a Cesarean section (C-section) may be needed.  Low Blood Glucose (hypoglycemia)  The baby makes extra insulin, in response to the extra sugar its gets from its mother. When the baby is born and no longer needs this extra insulin, the baby's blood glucose level may drop.  Jaundice (yellow coloring of the skin and eyes)  This is fairly common in babies. It is caused from a build-up of the chemical called bilirubin. This is rarely serious, but is seen more often in babies whose mothers had gestational diabetes. RISKS TO THE MOTHER Women who have had gestational diabetes may be at higher risk for some problems,  including:  Preeclampsia or toxemia, which includes problems with high blood pressure. Blood pressure and protein levels in the urine must be checked frequently.  Infections.  Cesarean section (C-section) for delivery.  Developing Type 2 diabetes later in life. About 30-50% will develop diabetes later, especially if obese. DIAGNOSIS  The hormones that cause insulin resistance are highest at about 24-28 weeks of pregnancy. If symptoms are experienced, they are much like symptoms you would normally expect during pregnancy.  GDM is often diagnosed using a two part method: 1. After 24-28 weeks of pregnancy, the woman drinks a glucose solution and takes a blood test. If the glucose level is high, a second test will be given. 2. Oral Glucose Tolerance Test (OGTT) which is 3 hours long  After not eating overnight, the blood glucose is checked. The woman drinks a glucose solution, and hourly blood glucose tests are taken. If the woman has risk factors for GDM, the caregiver may test earlier than 24 weeks of pregnancy. TREATMENT  Treatment of GDM is directed at keeping the mother's blood glucose level normal, and may include:  Meal planning.  Taking insulin or other medicine to control your blood glucose level.  Exercise.  Keeping a daily record of the foods you eat.  Blood glucose monitoring and keeping a record of your blood glucose levels.  May monitor ketone levels in the urine, although this is no longer considered necessary in most pregnancies. HOME CARE INSTRUCTIONS  While you are pregnant:  Follow your caregiver's advice regarding your prenatal appointments, meal planning, exercise, medicines, vitamins, blood and other tests, and physical   activities.  Keep a record of your meals, blood glucose tests, and the amount of insulin you are taking (if any). Show this to your caregiver at every prenatal visit.  If you have GDM, you may have problems with hypoglycemia (low blood glucose).  You may suspect this if you become suddenly dizzy, feel shaky, and/or weak. If you think this is happening and you have a glucose meter, try to test your blood glucose level. Follow your caregiver's advice for when and how to treat your low blood glucose. Generally, the 15:15 rule is followed: Treat by consuming 15 grams of carbohydrates, wait 15 minutes, and recheck blood glucose. Examples of 15 grams of carbohydrates are:  1 cup skim or low-fat milk.   cup juice.  3-4 glucose tablets.  5-6 hard candies.  1 small box raisins.   cup regular soda pop.  Practice good hygiene, to avoid infections.  Do not smoke. SEEK MEDICAL CARE IF:   You develop abnormal vaginal discharge, with or without itching.  You become weak and tired more than expected.  You seem to sweat a lot.  You have a sudden increase in weight, 5 pounds or more in one week.  You are losing weight, 3 pounds or more in a week.  Your blood glucose level is high, and you need instructions on what to do about it. SEEK IMMEDIATE MEDICAL CARE IF:   You develop a severe headache.  You faint or pass out.  You develop nausea and vomiting.  You become disoriented or confused.  You have a convulsion.  You develop vision problems.  You develop stomach pain.  You develop vaginal bleeding.  You develop uterine contractions.  You have leaking or a gush of fluid from the vagina. AFTER YOU HAVE THE BABY:  Go to all of your follow-up appointments, and have blood tests as advised by your caregiver.  Maintain a healthy lifestyle, to prevent diabetes in the future. This includes:  Following a healthy meal plan.  Controlling your weight.  Getting enough exercise and proper rest.  Do not smoke.  Breastfeed your baby if you can. This will lower the chance of you and your baby developing diabetes later in life. For more information about diabetes, go to the American Diabetes Association at:  www.americandiabetesassociation.org. For more information about gestational diabetes, go to the American Congress of Obstetricians and Gynecologists at: www.acog.org. Document Released: 03/10/2001 Document Revised: 02/24/2012 Document Reviewed: 10/02/2009 [employer] Patient Information 2013 [employer].  Breastfeeding Deciding to breastfeed is one of the best choices you can make for you and your baby. The information that follows gives a brief overview of the benefits of breastfeeding as well as common topics surrounding breastfeeding. BENEFITS OF BREASTFEEDING For the baby  The first milk (colostrum) helps the baby's digestive system function better.   There are antibodies in the mother's milk that help the baby fight off infections.   The baby has a lower incidence of asthma, allergies, and sudden infant death syndrome (SIDS).   The nutrients in breast milk are better for the baby than infant formulas, and breast milk helps the baby's brain grow better.   Babies who breastfeed have less gas, colic, and constipation.  For the mother  Breastfeeding helps develop a very special bond between the mother and her baby.   Breastfeeding is convenient, always available at the correct temperature, and costs nothing.   Breastfeeding burns calories in the mother and helps her lose weight that was gained during pregnancy.     Breastfeeding makes the uterus contract back down to normal size faster and slows bleeding following delivery.   Breastfeeding mothers have a lower risk of developing breast cancer.  BREASTFEEDING FREQUENCY  A healthy, full-term baby may breastfeed as often as every hour or space his or her feedings to every 3 hours.   Watch your baby for signs of hunger. Nurse your baby if he or she shows signs of hunger. How often you nurse will vary from baby to baby.   Nurse as often as the baby requests, or when you feel the need to reduce the fullness of your breasts.    Awaken the baby if it has been 3 4 hours since the last feeding.   Frequent feeding will help the mother make more milk and will help prevent problems, such as sore nipples and engorgement of the breasts.  BABY'S POSITION AT THE BREAST  Whether lying down or sitting, be sure that the baby's tummy is facing your tummy.   Support the breast with 4 fingers underneath the breast and the thumb above. Make sure your fingers are well away from the nipple and baby's mouth.   Stroke the baby's lips gently with your finger or nipple.   When the baby's mouth is open wide enough, place all of your nipple and as much of the areola as possible into your baby's mouth.   Pull the baby in close so the tip of the nose and the baby's cheeks touch the breast during the feeding.  FEEDINGS AND SUCTION  The length of each feeding varies from baby to baby and from feeding to feeding.   The baby must suck about 2 3 minutes for your milk to get to him or her. This is called a "let down." For this reason, allow the baby to feed on each breast as long as he or she wants. Your baby will end the feeding when he or she has received the right balance of nutrients.   To break the suction, put your finger into the corner of the baby's mouth and slide it between his or her gums before removing your breast from his or her mouth. This will help prevent sore nipples.  HOW TO TELL WHETHER YOUR BABY IS GETTING ENOUGH BREAST MILK. Wondering whether or not your baby is getting enough milk is a common concern among mothers. You can be assured that your baby is getting enough milk if:   Your baby is actively sucking and you hear swallowing.   Your baby seems relaxed and satisfied after a feeding.   Your baby nurses at least 8 12 times in a 24 hour time period. Nurse your baby until he or she unlatches or falls asleep at the first breast (at least 10 20 minutes), then offer the second side.   Your baby is wetting  5 6 disposable diapers (6 8 cloth diapers) in a 24 hour period by 5 6 days of age.   Your baby is having at least 3 4 stools every 24 hours for the first 6 weeks. The stool should be soft and yellow.   Your baby should gain 4 7 ounces per week after he or she is 4 days old.   Your breasts feel softer after nursing.  REDUCING BREAST ENGORGEMENT  In the first week after your baby is born, you may experience signs of breast engorgement. When breasts are engorged, they feel heavy, warm, full, and may be tender to the touch. You can reduce   engorgement if you:   Nurse frequently, every 2 3 hours. Mothers who breastfeed early and often have fewer problems with engorgement.   Place light ice packs on your breasts for 10 20 minutes between feedings. This reduces swelling. Wrap the ice packs in a lightweight towel to protect your skin. Bags of frozen vegetables work well for this purpose.   Take a warm shower or apply warm, moist heat to your breast for 5 10 minutes just before each feeding. This increases circulation and helps the milk flow.   Gently massage your breast before and during the feeding. Using your finger tips, massage from the chest wall towards your nipple in a circular motion.   Make sure that the baby empties at least one breast at every feeding before switching sides.   Use a breast pump to empty the breasts if your baby is sleepy or not nursing well. You may also want to pump if you are returning to work oryou feel you are getting engorged.   Avoid bottle feeds, pacifiers, or supplemental feedings of water or juice in place of breastfeeding. Breast milk is all the food your baby needs. It is not necessary for your baby to have water or formula. In fact, to help your breasts make more milk, it is best not to give your baby supplemental feedings during the early weeks.   Be sure the baby is latched on and positioned properly while breastfeeding.   Wear a supportive  bra, avoiding underwire styles.   Eat a balanced diet with enough fluids.   Rest often, relax, and take your prenatal vitamins to prevent fatigue, stress, and anemia.  If you follow these suggestions, your engorgement should improve in 24 48 hours. If you are still experiencing difficulty, call your lactation consultant or caregiver.  CARING FOR YOURSELF Take care of your breasts  Bathe or shower daily.   Avoid using soap on your nipples.   Start feedings on your left breast at one feeding and on your right breast at the next feeding.   You will notice an increase in your milk supply 2 5 days after delivery. You may feel some discomfort from engorgement, which makes your breasts very firm and often tender. Engorgement "peaks" out within 24 48 hours. In the meantime, apply warm moist towels to your breasts for 5 10 minutes before feeding. Gentle massage and expression of some milk before feeding will soften your breasts, making it easier for your baby to latch on.   Wear a well-fitting nursing bra, and air dry your nipples for a 3 4minutes after each feeding.   Only use cotton bra pads.   Only use pure lanolin on your nipples after nursing. You do not need to wash it off before feeding the baby again. Another option is to express a few drops of breast milk and gently massage it into your nipples.  Take care of yourself  Eat well-balanced meals and nutritious snacks.   Drinking milk, fruit juice, and water to satisfy your thirst (about 8 glasses a day).   Get plenty of rest.  Avoid foods that you notice affect the baby in a bad way.  SEEK MEDICAL CARE IF:   You have difficulty with breastfeeding and need help.   You have a hard, red, sore area on your breast that is accompanied by a fever.   Your baby is too sleepy to eat well or is having trouble sleeping.   Your baby is wetting less than   6 diapers a day, by 64 days of age.   Your baby's skin or white part of  his or her eyes is more yellow than it was in the hospital.   You feel depressed.  Document Released: 12/02/2005 Document Revised: 06/02/2012 Document Reviewed: 03/01/2012 [facility] Patient Information 2013 [place], [place]. Acanthosis Nigricans Acanthosis nigricans (AN) is a disorder that may begin at any age, including birth. It causes velvety, light brown, black, or grayish markings on the skin. They are usually found on the:  Face.  Neck.  Armpits.  Inner thighs.  Groin. AN can be noncancerous (benign) or associated with cancer (malignant). Most often, AN is a benign condition. Benign AN is primarily associated with being overweight. In young people, insulin resistance is the most common association with AN. Insulin is the hormone that controls your blood sugar. Insulin resistance occurs when the body does not use its insulin properly. Benign AN may cause social problems, since the person may appear as if he or she has poor hygiene.  CAUSES  Some people are born with AN. It is sometimes caused by a hormonal or glandular disorder, such as diabetes. Eating too much of the wrong foods, especially starches and sugars, raises insulin levels. Most patients with AN have a high insulin level. Increased insulin activates insulin receptors in the skin and forces them to grow abnormally. This may help cause AN. Reducing insulin by a special diet can lead to a rapid improvement of the skin problem. Both sexes are affected equally. Rarely, AN is associated with a tumor. The type of AN associated with malignancy more often occurs in elderly people. However, cases have been reported in children with a rare kidney cancer called Wilms' tumor. Malignant AN affects all races equally. SYMPTOMS  AN usually does not cause symptoms. Most people who have AN are bothered primarily by its appearance. DIAGNOSIS  When AN develops in people who are not overweight, medical tests are often done to find the cause. When  AN is associated with malignancy, it is unusually severe. In those cases, AN can be seen in additional places, such as the lips or hands. AN associated with malignancy is linked to major problems because it is caused by the presence of a cancer. The tumor is often aggressive and destructive. Benign AN has a good outcome. It is easily treated with good results. TREATMENT   Treatment to improve the appearance of AN includes prescription medicines (retinoids, 20% urea, alpha hydroxy acids, salicylic acid).  If overweight, avoiding starchy foods and sugars that raise the insulin level can help. Losing weight will also help decrease the appearance of AN tremendously.  Oral medicines are available that help decrease high insulin. HOME CARE INSTRUCTIONS   If you are overweight, exercise and watch your diet to lose the extra weight.  Use medicines prescribed by your caregiver as instructed. SEEK MEDICAL CARE IF:  You develop an unexplained case of AN in adulthood. Document Released: 12/02/2005 Document Revised: 02/24/2012 Document Reviewed: 03/22/2010 [facility] Patient Information 2013 [place], [place].

## 2012-12-17 ENCOUNTER — Ambulatory Visit (INDEPENDENT_AMBULATORY_CARE_PROVIDER_SITE_OTHER): Payer: Medicaid Other | Admitting: *Deleted

## 2012-12-17 VITALS — BP 127/74 | Wt 185.6 lb

## 2012-12-17 DIAGNOSIS — O24419 Gestational diabetes mellitus in pregnancy, unspecified control: Secondary | ICD-10-CM

## 2012-12-17 DIAGNOSIS — O99810 Abnormal glucose complicating pregnancy: Secondary | ICD-10-CM

## 2012-12-17 NOTE — Progress Notes (Signed)
P-[id number] 

## 2012-12-21 ENCOUNTER — Ambulatory Visit (INDEPENDENT_AMBULATORY_CARE_PROVIDER_SITE_OTHER): Payer: Medicaid Other | Admitting: Obstetrics and Gynecology

## 2012-12-21 ENCOUNTER — Encounter: Payer: Self-pay | Admitting: Obstetrics and Gynecology

## 2012-12-21 VITALS — BP 117/76 | Temp 97.3°F | Wt 184.7 lb

## 2012-12-21 DIAGNOSIS — O34219 Maternal care for unspecified type scar from previous cesarean delivery: Secondary | ICD-10-CM

## 2012-12-21 DIAGNOSIS — Z3490 Encounter for supervision of normal pregnancy, unspecified, unspecified trimester: Secondary | ICD-10-CM

## 2012-12-21 DIAGNOSIS — O360990 Maternal care for other rhesus isoimmunization, unspecified trimester, not applicable or unspecified: Secondary | ICD-10-CM

## 2012-12-21 DIAGNOSIS — Z6791 Unspecified blood type, Rh negative: Secondary | ICD-10-CM

## 2012-12-21 DIAGNOSIS — O99810 Abnormal glucose complicating pregnancy: Secondary | ICD-10-CM

## 2012-12-21 DIAGNOSIS — E663 Overweight: Secondary | ICD-10-CM

## 2012-12-21 DIAGNOSIS — O26899 Other specified pregnancy related conditions, unspecified trimester: Secondary | ICD-10-CM

## 2012-12-21 DIAGNOSIS — O24419 Gestational diabetes mellitus in pregnancy, unspecified control: Secondary | ICD-10-CM

## 2012-12-21 DIAGNOSIS — Z6825 Body mass index (BMI) 25.0-25.9, adult: Secondary | ICD-10-CM

## 2012-12-21 LAB — POCT URINALYSIS DIP (DEVICE)
Bilirubin Urine: NEGATIVE
Hgb urine dipstick: NEGATIVE
Leukocytes, UA: NEGATIVE
Nitrite: NEGATIVE
Protein, ur: NEGATIVE mg/dL
Urobilinogen, UA: 0.2 mg/dL (ref 0.0–1.0)
pH: 6.5 (ref 5.0–8.0)

## 2012-12-21 NOTE — Progress Notes (Signed)
Pulse- 98  Pain/pressure- lower abd

## 2012-12-21 NOTE — Progress Notes (Signed)
NST reviewed and reactive. Patient did not bring log book but reports fasting CBG highest 85 and 2hr pp 125. Advised to bring log book at next visit. Continue twice weekly NST. FM/PTL precautions reviewed.

## 2012-12-25 ENCOUNTER — Ambulatory Visit (INDEPENDENT_AMBULATORY_CARE_PROVIDER_SITE_OTHER): Payer: Medicaid Other | Admitting: *Deleted

## 2012-12-25 VITALS — BP 120/65 | Wt 187.3 lb

## 2012-12-25 DIAGNOSIS — O24419 Gestational diabetes mellitus in pregnancy, unspecified control: Secondary | ICD-10-CM

## 2012-12-25 DIAGNOSIS — O99810 Abnormal glucose complicating pregnancy: Secondary | ICD-10-CM

## 2012-12-25 NOTE — Progress Notes (Signed)
P = 98    Pt brought log book today- fastings 75-87,  PP 95-123.

## 2012-12-25 NOTE — Progress Notes (Signed)
NST 12/25/12 reactive 

## 2012-12-28 ENCOUNTER — Other Ambulatory Visit (HOSPITAL_COMMUNITY)
Admission: RE | Admit: 2012-12-28 | Discharge: 2012-12-28 | Disposition: A | Discharge status: Home / Self Care | Payer: Medicaid Other | Source: Ambulatory Visit | Attending: Obstetrics and Gynecology | Admitting: Obstetrics and Gynecology

## 2012-12-28 ENCOUNTER — Encounter: Payer: Self-pay | Admitting: Obstetrics and Gynecology

## 2012-12-28 ENCOUNTER — Ambulatory Visit (INDEPENDENT_AMBULATORY_CARE_PROVIDER_SITE_OTHER): Payer: Medicaid Other | Admitting: Obstetrics and Gynecology

## 2012-12-28 VITALS — BP 111/70 | Temp 97.1°F | Wt 187.6 lb

## 2012-12-28 DIAGNOSIS — O24419 Gestational diabetes mellitus in pregnancy, unspecified control: Secondary | ICD-10-CM

## 2012-12-28 DIAGNOSIS — O34219 Maternal care for unspecified type scar from previous cesarean delivery: Secondary | ICD-10-CM

## 2012-12-28 DIAGNOSIS — E663 Overweight: Secondary | ICD-10-CM

## 2012-12-28 DIAGNOSIS — O26899 Other specified pregnancy related conditions, unspecified trimester: Secondary | ICD-10-CM

## 2012-12-28 DIAGNOSIS — Z6791 Unspecified blood type, Rh negative: Secondary | ICD-10-CM

## 2012-12-28 DIAGNOSIS — Z113 Encounter for screening for infections with a predominantly sexual mode of transmission: Secondary | ICD-10-CM | POA: Insufficient documentation

## 2012-12-28 DIAGNOSIS — Z8742 Personal history of other diseases of the female genital tract: Secondary | ICD-10-CM

## 2012-12-28 DIAGNOSIS — O09529 Supervision of elderly multigravida, unspecified trimester: Secondary | ICD-10-CM

## 2012-12-28 DIAGNOSIS — O360990 Maternal care for other rhesus isoimmunization, unspecified trimester, not applicable or unspecified: Secondary | ICD-10-CM

## 2012-12-28 DIAGNOSIS — O99810 Abnormal glucose complicating pregnancy: Secondary | ICD-10-CM

## 2012-12-28 DIAGNOSIS — Z6825 Body mass index (BMI) 25.0-25.9, adult: Secondary | ICD-10-CM

## 2012-12-28 LAB — POCT URINALYSIS DIP (DEVICE)
Leukocytes, UA: NEGATIVE
Nitrite: NEGATIVE
Protein, ur: NEGATIVE mg/dL
Urobilinogen, UA: 0.2 mg/dL (ref 0.0–1.0)
pH: 6 (ref 5.0–8.0)

## 2012-12-28 NOTE — Progress Notes (Signed)
CBG reviewed and all within range. Patient doing well reporting irregular contractions. FM/PTL precautions reviewed. Cultures done today. F/U MFM ultrasound on 1/27

## 2012-12-28 NOTE — Progress Notes (Signed)
NST reviewed and reactive.  

## 2012-12-28 NOTE — Progress Notes (Signed)
Pulse- 97 Patient reports vaginal pain/pressure and occasional nonpainful contractions

## 2012-12-28 NOTE — Progress Notes (Signed)
NST reviewed and reactive.  Susan Hopkins L. Harraway-Smith, M.D., FACOG    

## 2012-12-28 NOTE — Addendum Note (Signed)
Addended by: Jill Side on: 12/28/2012 11:28 AM   Modules accepted: Orders

## 2012-12-28 NOTE — Addendum Note (Signed)
Addended by: Kathee Delton on: 12/28/2012 03:20 PM   Modules accepted: Orders

## 2012-12-29 ENCOUNTER — Encounter: Payer: Self-pay | Admitting: *Deleted

## 2012-12-29 NOTE — Progress Notes (Signed)
1 

## 2012-12-31 ENCOUNTER — Encounter: Payer: Self-pay | Admitting: Obstetrics and Gynecology

## 2012-12-31 LAB — CULTURE, BETA STREP (GROUP B ONLY)

## 2013-01-01 ENCOUNTER — Ambulatory Visit (INDEPENDENT_AMBULATORY_CARE_PROVIDER_SITE_OTHER): Payer: Medicaid Other | Admitting: *Deleted

## 2013-01-01 VITALS — BP 113/67 | Wt 189.0 lb

## 2013-01-01 DIAGNOSIS — O99810 Abnormal glucose complicating pregnancy: Secondary | ICD-10-CM

## 2013-01-01 DIAGNOSIS — O24419 Gestational diabetes mellitus in pregnancy, unspecified control: Secondary | ICD-10-CM

## 2013-01-01 NOTE — Progress Notes (Signed)
NST reviewed and reactive.  Noble Cicalese L. Harraway-Smith, M.D., FACOG    

## 2013-01-01 NOTE — Progress Notes (Signed)
P=99 

## 2013-01-04 ENCOUNTER — Ambulatory Visit (INDEPENDENT_AMBULATORY_CARE_PROVIDER_SITE_OTHER): Payer: Medicaid Other | Admitting: Obstetrics & Gynecology

## 2013-01-04 VITALS — BP 121/72 | Temp 97.3°F | Wt 187.0 lb

## 2013-01-04 DIAGNOSIS — O09529 Supervision of elderly multigravida, unspecified trimester: Secondary | ICD-10-CM

## 2013-01-04 DIAGNOSIS — O99810 Abnormal glucose complicating pregnancy: Secondary | ICD-10-CM

## 2013-01-04 DIAGNOSIS — O24419 Gestational diabetes mellitus in pregnancy, unspecified control: Secondary | ICD-10-CM

## 2013-01-04 DIAGNOSIS — O360990 Maternal care for other rhesus isoimmunization, unspecified trimester, not applicable or unspecified: Secondary | ICD-10-CM

## 2013-01-04 DIAGNOSIS — O26899 Other specified pregnancy related conditions, unspecified trimester: Secondary | ICD-10-CM

## 2013-01-04 DIAGNOSIS — Z6791 Unspecified blood type, Rh negative: Secondary | ICD-10-CM

## 2013-01-04 DIAGNOSIS — O34219 Maternal care for unspecified type scar from previous cesarean delivery: Secondary | ICD-10-CM

## 2013-01-04 LAB — POCT URINALYSIS DIP (DEVICE)
Bilirubin Urine: NEGATIVE
Glucose, UA: NEGATIVE mg/dL
Hgb urine dipstick: NEGATIVE
Ketones, ur: NEGATIVE mg/dL
Nitrite: NEGATIVE
Specific Gravity, Urine: >=1.03 (ref 1.005–1.030)
pH: 6 (ref 5.0–8.0)

## 2013-01-04 NOTE — Progress Notes (Signed)
Blood sugars all within range, continue glyburide as prescribed. Growth scan next week. RCS scheduled at 39 weeks.  NST reactive and AFI today 12.3 cm.  No other complaints or concerns.  Fetal movement and labor precautions reviewed.

## 2013-01-04 NOTE — Progress Notes (Signed)
Pulse- 96 Patient reports pelvic pain & pressure & occasional contractions

## 2013-01-04 NOTE — Patient Instructions (Signed)
Cesarean Delivery  Cesarean delivery is the birth of a baby through a cut (incision) in the abdomen and womb (uterus).  LET YOUR CAREGIVER KNOW ABOUT:  Complicationsinvolving the pregnancy.  Allergies.  Medicines taken including herbs, eyedrops, over-the-counter medicines, and creams.  Use of steroids (by mouth or creams).  Previous problems with anesthetics or numbing medicine.  Previous surgery.  History of blood clots.  History of bleeding or blood problems.  Other health problems. RISKS AND COMPLICATIONS   Bleeding.  Infection.  Blood clots.  Injury to surrounding organs.  Anesthesia problems.  Injury to the baby. BEFORE THE PROCEDURE   A tube (Foley catheter) will be placed in your bladder. The Foley catheter drains the urine from your bladder into a bag. This keeps your bladder empty during surgery.  An intravenous access tube (IV) will be placed in your arm.  Hair may be removed from your pubic area and your lower abdomen. This is to prevent infection in the incision site.  You may be given an antacid medicine to drink. This will prevent acid contents in your stomach from going into your lungs if you vomit during the surgery.  You may be given an antibiotic medicine to prevent infection. PROCEDURE   You may be given medicine to numb the lower half of your body (regional anesthetic). If you were in labor, you may have already had an epidural in place which can be used in both labor and cesarean delivery. You may possibly be given medicine to make you sleep (general anesthetic) though this is not as common.  An incision will be made in your abdomen that extends to your uterus. There are 2 basic kinds of incisions:  The horizontal (transverse) incision. Horizontal incisions are used for most routine cesarean deliveries.  The vertical (up and down) incision. This is less commonly used. This is most often reserved for women who have a serious complication  (extreme prematurity) or under emergency situations.  The horizontal and vertical incisions may both be used at the same time. However, this is very uncommon.  Your baby will then be delivered. AFTER THE PROCEDURE   If you were awake during the surgery, you will see your baby right away. If you were asleep, you will see your baby as soon as you are awake.  You may breastfeed your baby after surgery.  You may be able to get up and walk the same day as the surgery. If you need to stay in bed for a period of time, you will receive help to turn, cough, and take deep breaths after surgery. This helps prevent lung problems such as pneumonia.  Do not get out of bed alone the first time after surgery. You will need help getting out of bed until you are able to do this by yourself.  You may be able to shower the day after your cesarean delivery. After the bandage (dressing) is taken off the incision site, a nurse will assist you to shower, if you like.  You will have pneumatic compressing hose placed on your feet or lower legs. These hose are used to prevent blood clots. When you are up and walking regularly, they will no longer be necessary.  Do not cross your legs when you sit.  Save any blood clots that you pass. If you pass a clot while on the toilet, do not flush it. Call for the nurse. Tell the nurse if you think you are bleeding too much or passing too many   clots.  Start drinking liquids and eating food as directed by your caregiver. If your stomach is not ready, drinking and eating too soon can cause an increase in bloating and swelling of your intestine and abdomen. This is very uncomfortable.  You will be given medicine as needed. Let your caregivers know if you are hurting. They want you to be comfortable. You may also be given an antibiotic to prevent an infection.  Your IV will be taken out when you are drinking a reasonable amount of fluids. The Foley catheter is taken out when  you are up and walking.  If your blood type is Rh negative and your baby's blood type is Rh positive, you will be given a shot of anti-D immune globulin. This shot prevents you from having Rh problems with a future pregnancy. You should get the shot even if you had your tubes tied (tubal ligation).  If you are allowed to take the baby for a walk, place the baby in the bassinet and push it. Do not carry your baby in your arms. Document Released: 12/02/2005 Document Revised: 02/24/2012 Document Reviewed: 03/29/2011 [employer] Patient Information 2013 [employer].  

## 2013-01-05 ENCOUNTER — Ambulatory Visit (HOSPITAL_COMMUNITY): Payer: Medicaid Other

## 2013-01-08 ENCOUNTER — Ambulatory Visit (INDEPENDENT_AMBULATORY_CARE_PROVIDER_SITE_OTHER): Payer: Medicaid Other | Admitting: *Deleted

## 2013-01-08 VITALS — BP 128/65 | Wt 188.1 lb

## 2013-01-08 DIAGNOSIS — O24419 Gestational diabetes mellitus in pregnancy, unspecified control: Secondary | ICD-10-CM

## 2013-01-08 DIAGNOSIS — O99810 Abnormal glucose complicating pregnancy: Secondary | ICD-10-CM

## 2013-01-08 NOTE — Progress Notes (Signed)
P = 107 

## 2013-01-11 ENCOUNTER — Ambulatory Visit (HOSPITAL_COMMUNITY)
Admission: RE | Admit: 2013-01-11 | Discharge: 2013-01-11 | Disposition: A | Discharge status: Home / Self Care | Payer: Medicaid Other | Source: Ambulatory Visit | Attending: Obstetrics & Gynecology | Admitting: Obstetrics & Gynecology

## 2013-01-11 ENCOUNTER — Ambulatory Visit (INDEPENDENT_AMBULATORY_CARE_PROVIDER_SITE_OTHER): Payer: Medicaid Other | Admitting: Obstetrics & Gynecology

## 2013-01-11 ENCOUNTER — Encounter (HOSPITAL_COMMUNITY): Payer: Self-pay

## 2013-01-11 VITALS — BP 122/68 | Temp 97.6°F | Wt 190.0 lb

## 2013-01-11 VITALS — BP 123/77 | HR 92 | Wt 191.5 lb

## 2013-01-11 DIAGNOSIS — O24419 Gestational diabetes mellitus in pregnancy, unspecified control: Secondary | ICD-10-CM

## 2013-01-11 DIAGNOSIS — O24919 Unspecified diabetes mellitus in pregnancy, unspecified trimester: Secondary | ICD-10-CM

## 2013-01-11 DIAGNOSIS — O34219 Maternal care for unspecified type scar from previous cesarean delivery: Secondary | ICD-10-CM

## 2013-01-11 DIAGNOSIS — O99810 Abnormal glucose complicating pregnancy: Secondary | ICD-10-CM

## 2013-01-11 DIAGNOSIS — O09529 Supervision of elderly multigravida, unspecified trimester: Secondary | ICD-10-CM | POA: Insufficient documentation

## 2013-01-11 DIAGNOSIS — O09299 Supervision of pregnancy with other poor reproductive or obstetric history, unspecified trimester: Secondary | ICD-10-CM | POA: Insufficient documentation

## 2013-01-11 LAB — POCT URINALYSIS DIP (DEVICE)
Bilirubin Urine: NEGATIVE
Hgb urine dipstick: NEGATIVE
Ketones, ur: NEGATIVE mg/dL
Leukocytes, UA: NEGATIVE
Specific Gravity, Urine: 1.025 (ref 1.005–1.030)
pH: 6 (ref 5.0–8.0)

## 2013-01-11 NOTE — Progress Notes (Signed)
Pulse 96  Edema trace in hands, and feet. C/o pain and pressure in pelvic.

## 2013-01-11 NOTE — Patient Instructions (Signed)
Cesarean Delivery  Care After  Refer to this sheet in the next few weeks. These instructions provide you with information on caring for yourself after your procedure. Your caregiver may also give you more specific instructions. Your treatment has been planned according to current medical practices, but problems sometimes occur. Call your caregiver if you have any problems or questions after your procedure.  HOME CARE INSTRUCTIONS  Healing will take time. You will have discomfort, tenderness, swelling, and bruising at the surgery site for a couple of weeks. This is normal and will get better as time goes on.  Activity  · Rest as much as possible the first 2 weeks.  · When possible, have someone help you with your household activities and your baby for 2 to 3 weeks.  · Limit your housework and social activity. Increase your activity gradually as your strength returns.  · Do not climb stairs more than 2 to 3 times a day.  · Do not lift anything heavier than your baby.  · Follow your caregiver's instructions about driving a car.  · Exercise only as directed by your caregiver.  Nutrition  · You may return to your usual diet. Eat a well-balanced diet.  · Drink enough fluid to keep your urine clear or pale yellow.  · Keep taking your prenatal or multivitamins.  · Do not drink alcohol until your caregiver says it is okay.  Elimination  You should return to your usual bowel function. If you develop constipation, ask your caregiver about taking a mild laxative that will help you go to the bathroom. Bran foods and fluids help with constipation. Gradually add fruit, vegetables, and bran to your diet.   Hygiene  · You may shower, wash your hair, and take tub baths unless your caregiver tells you otherwise.  · Continue perineal care until your vaginal bleeding and discharge stops.  · Do not douche or use tampons until your caregiver says it is okay.  Fever  If you feel feverish or have shaking chills, take your temperature. The  fever may indicate infection. Infections can be treated with antibiotic medicine.  Pain Control and Medicine  · Only take over-the-counter or prescription medicine as directed by your caregiver. Do not take aspirin. It can cause bleeding.  · Do not drive when taking pain medicine.  · Talk to your caregiver about restarting or adjusting your normal medicines.  Incision Care  · Clean your cut (incision) gently with soap and water, then pat dry.  · If your caregiver says it is okay, leave the incision without a bandage (dressing) unless it is draining fluid or irritated.  · If you have small adhesive strips across the incision and they do not fall off within 7 days, carefully peel them off.  · Check the incision daily for increased redness, drainage, swelling, or separation of skin.  · Hug a pillow when coughing or sneezing. This helps to relieve pain.  Vaginal Care  You may have a vaginal discharge or bleeding for up to 6 weeks. If the vaginal discharge becomes bright red, bad smelling, heavy in amount, has blood clots, or if you have burning or frequent urination, call your caregiver. If your bleeding slows down and then gets heavier, your body is telling you to slow down and relax more.  Sexual Intercourse  · Check with your caregiver before resuming sexual activity. Often, after 4 to 6 weeks, if you feel good and are well rested, sexual activity may be resumed. Avoid   positions that strain the incision site.  · You can become pregnant before you have a period. If you decide to have sexual intercourse, use birth control if you do not want to become pregnant right away.  Health Practices  · Keep all your postpartum appointments as recommended by your caregiver. Generally, your caregiver will want to see you in 2 to 3 weeks.  · Continue with your yearly pelvic exams.  · Continue monthly self-breast exams and yearly physical exams with a Pap test.  Breast Care  · If you are not breastfeeding and your breasts become  tender, hard, or leak milk, you may wear a tight-fitting bra and apply ice to your breasts.  · If you are breastfeeding, wear a good support bra.  · Call your caregiver if you have breast pain, flu-like symptoms, fever, or hardness and reddening of your breasts.  Postpartum Blues  You may have a period of low spirits or "blues" after your baby is born. Discuss your feelings with your partner, family, and friends. This may be caused by the changing hormone levels in your body. You may want to contact your caregiver if this is worrisome.  Miscellaneous  · Limit wearing support panties or control-top hose.  · If you breastfeed, you may not have a period for several months or longer. This is normal for the nursing mother. If you do not menstruate within 6 weeks after you stop breastfeeding, see your caregiver.  · If you are not breastfeeding, you can expect to menstruate within 6 to 10 weeks after birth. If you have not started by the 11th week, check with your caregiver.  SEEK MEDICAL CARE IF:   · There is swelling, redness, or increasing pain in the wound area.  · You have pus coming from the wound.  · You notice a bad smell from the wound or surgical dressing.  · You have pain, redness, and swelling from the intravenous (IV) site.  · Your wound breaks open (the edges are not staying together).  · You feel dizzy or feel like fainting.  · You develop pain or bleeding when you urinate.  · You develop diarrhea.  · You develop nausea and vomiting.  · You develop abnormal vaginal discharge.  · You develop a rash.  · You have any type of abnormal reaction or develop an allergy to your medicine.  · Your pain is not relieved by your medicine or becomes worse.  · Your temperature is 101° F (38.3° C), or is 100.4° F (38° C) taken 2 times in a 4 hour period.  SEEK IMMEDIATE MEDICAL CARE:  · You develop a temperature of 102° F (38.9° C) or higher.  · You develop abdominal pain.  · You develop chest pain.  · You develop shortness  of breath.  · You faint.  · You develop pain, swelling, or redness of your leg.  · You develop heavy vaginal bleeding with or without blood clots.  Document Released: 08/24/2002 Document Revised: 02/24/2012 Document Reviewed: 02/27/2011  [employer]® Patient Information ©2013 [employer].

## 2013-01-11 NOTE — Progress Notes (Signed)
NST reactive, [place] 56 %ile, cephalic, normal AFI. BS all in range, continue glyburide.

## 2013-01-13 NOTE — Progress Notes (Signed)
NST 12/25/12 reactive

## 2013-01-14 ENCOUNTER — Other Ambulatory Visit: Payer: Medicaid Other

## 2013-01-15 ENCOUNTER — Encounter (HOSPITAL_COMMUNITY): Payer: Self-pay | Admitting: Pharmacist

## 2013-01-17 ENCOUNTER — Other Ambulatory Visit: Payer: Self-pay | Admitting: Family Medicine

## 2013-01-18 ENCOUNTER — Ambulatory Visit (HOSPITAL_COMMUNITY)
Admission: RE | Admit: 2013-01-18 | Discharge: 2013-01-18 | Disposition: A | Discharge status: Home / Self Care | Payer: Medicaid Other | Source: Ambulatory Visit | Attending: Obstetrics & Gynecology | Admitting: Obstetrics & Gynecology

## 2013-01-18 ENCOUNTER — Ambulatory Visit (INDEPENDENT_AMBULATORY_CARE_PROVIDER_SITE_OTHER): Payer: Medicaid Other | Admitting: Obstetrics and Gynecology

## 2013-01-18 VITALS — BP 121/76 | Temp 97.4°F | Wt 190.4 lb

## 2013-01-18 DIAGNOSIS — O99810 Abnormal glucose complicating pregnancy: Secondary | ICD-10-CM

## 2013-01-18 DIAGNOSIS — O09299 Supervision of pregnancy with other poor reproductive or obstetric history, unspecified trimester: Secondary | ICD-10-CM | POA: Insufficient documentation

## 2013-01-18 DIAGNOSIS — O09529 Supervision of elderly multigravida, unspecified trimester: Secondary | ICD-10-CM | POA: Insufficient documentation

## 2013-01-18 DIAGNOSIS — O24419 Gestational diabetes mellitus in pregnancy, unspecified control: Secondary | ICD-10-CM

## 2013-01-18 LAB — POCT URINALYSIS DIP (DEVICE)
Leukocytes, UA: NEGATIVE
Protein, ur: NEGATIVE mg/dL
Urobilinogen, UA: 0.2 mg/dL (ref 0.0–1.0)
pH: 6 (ref 5.0–8.0)

## 2013-01-18 NOTE — Progress Notes (Signed)
Pulse- 93  Edema-feet/hands  Pressure- lower abd Pt c/o stopping glyburide due to hypoglycemia during the night

## 2013-01-18 NOTE — Patient Instructions (Signed)
Pregnancy - Third Trimester  The third trimester of pregnancy (the last 3 months) is a period of the most rapid growth for you and your baby. The baby approaches a length of 20 inches and a weight of 6 to 10 pounds. The baby is adding on fat and getting ready for life outside your body. While inside, babies have periods of sleeping and waking, suck their thumbs, and hiccups. You can often feel small contractions of the uterus. This is false labor. It is also called Braxton-Hicks contractions. This is like a practice for labor. The usual problems in this stage of pregnancy include more difficulty breathing, swelling of the hands and feet from water retention, and having to urinate more often because of the uterus and baby pressing on your bladder.   PRENATAL EXAMS  · Blood work may continue to be done during prenatal exams. These tests are done to check on your health and the probable health of your baby. Blood work is used to follow your blood levels (hemoglobin). Anemia (low hemoglobin) is common during pregnancy. Iron and vitamins are given to help prevent this. You may also continue to be checked for diabetes. Some of the past blood tests may be done again.  · The size of the uterus is measured during each visit. This makes sure your baby is growing properly according to your pregnancy dates.  · Your blood pressure is checked every prenatal visit. This is to make sure you are not getting toxemia.  · Your urine is checked every prenatal visit for infection, diabetes and protein.  · Your weight is checked at each visit. This is done to make sure gains are happening at the suggested rate and that you and your baby are growing normally.  · Sometimes, an ultrasound is performed to confirm the position and the proper growth and development of the baby. This is a test done that bounces harmless sound waves off the baby so your caregiver can more accurately determine due dates.  · Discuss the type of pain medication and  anesthesia you will have during your labor and delivery.  · Discuss the possibility and anesthesia if a Cesarean Section might be necessary.  · Inform your caregiver if there is any mental or physical violence at home.  Sometimes, a specialized non-stress test, contraction stress test and biophysical profile are done to make sure the baby is not having a problem. Checking the amniotic fluid surrounding the baby is called an amniocentesis. The amniotic fluid is removed by sticking a needle into the belly (abdomen). This is sometimes done near the end of pregnancy if an early delivery is required. In this case, it is done to help make sure the baby's lungs are mature enough for the baby to live outside of the womb. If the lungs are not mature and it is unsafe to deliver the baby, an injection of cortisone medication is given to the mother 1 to 2 days before the delivery. This helps the baby's lungs mature and makes it safer to deliver the baby.  CHANGES OCCURING IN THE THIRD TRIMESTER OF PREGNANCY  Your body goes through many changes during pregnancy. They vary from person to person. Talk to your caregiver about changes you notice and are concerned about.  · During the last trimester, you have probably had an increase in your appetite. It is normal to have cravings for certain foods. This varies from person to person and pregnancy to pregnancy.  · You may begin to   get stretch marks on your hips, abdomen, and breasts. These are normal changes in the body during pregnancy. There are no exercises or medications to take which prevent this change.  · Constipation may be treated with a stool softener or adding bulk to your diet. Drinking lots of fluids, fiber in vegetables, fruits, and whole grains are helpful.  · Exercising is also helpful. If you have been very active up until your pregnancy, most of these activities can be continued during your pregnancy. If you have been less active, it is helpful to start an exercise  program such as walking. Consult your caregiver before starting exercise programs.  · Avoid all smoking, alcohol, un-prescribed drugs, herbs and "street drugs" during your pregnancy. These chemicals affect the formation and growth of the baby. Avoid chemicals throughout the pregnancy to ensure the delivery of a healthy infant.  · Backache, varicose veins and hemorrhoids may develop or get worse.  · You will tire more easily in the third trimester, which is normal.  · The baby's movements may be stronger and more often.  · You may become short of breath easily.  · Your belly button may stick out.  · A yellow discharge may leak from your breasts called colostrum.  · You may have a bloody mucus discharge. This usually occurs a few days to a week before labor begins.  HOME CARE INSTRUCTIONS   · Keep your caregiver's appointments. Follow your caregiver's instructions regarding medication use, exercise, and diet.  · During pregnancy, you are providing food for you and your baby. Continue to eat regular, well-balanced meals. Choose foods such as meat, fish, milk and other low fat dairy products, vegetables, fruits, and whole-grain breads and cereals. Your caregiver will tell you of the ideal weight gain.  · A physical sexual relationship may be continued throughout pregnancy if there are no other problems such as early (premature) leaking of amniotic fluid from the membranes, vaginal bleeding, or belly (abdominal) pain.  · Exercise regularly if there are no restrictions. Check with your caregiver if you are unsure of the safety of your exercises. Greater weight gain will occur in the last 2 trimesters of pregnancy. Exercising helps:  · Control your weight.  · Get you in shape for labor and delivery.  · You lose weight after you deliver.  · Rest a lot with legs elevated, or as needed for leg cramps or low back pain.  · Wear a good support or jogging bra for breast tenderness during pregnancy. This may help if worn during  sleep. Pads or tissues may be used in the bra if you are leaking colostrum.  · Do not use hot tubs, steam rooms, or saunas.  · Wear your seat belt when driving. This protects you and your baby if you are in an accident.  · Avoid raw meat, cat litter boxes and soil used by cats. These carry germs that can cause birth defects in the baby.  · It is easier to loose urine during pregnancy. Tightening up and strengthening the pelvic muscles will help with this problem. You can practice stopping your urination while you are going to the bathroom. These are the same muscles you need to strengthen. It is also the muscles you would use if you were trying to stop from passing gas. You can practice tightening these muscles up 10 times a set and repeating this about 3 times per day. Once you know what muscles to tighten up, do not perform these   exercises during urination. It is more likely to cause an infection by backing up the urine.  · Ask for help if you have financial, counseling or nutritional needs during pregnancy. Your caregiver will be able to offer counseling for these needs as well as refer you for other special needs.  · Make a list of emergency phone numbers and have them available.  · Plan on getting help from family or friends when you go home from the hospital.  · Make a trial run to the hospital.  · Take prenatal classes with the father to understand, practice and ask questions about the labor and delivery.  · Prepare the baby's room/nursery.  · Do not travel out of the city unless it is absolutely necessary and with the advice of your caregiver.  · Wear only low or no heal shoes to have better balance and prevent falling.  MEDICATIONS AND DRUG USE IN PREGNANCY  · Take prenatal vitamins as directed. The vitamin should contain 1 milligram of folic acid. Keep all vitamins out of reach of children. Only a couple vitamins or tablets containing iron may be fatal to a baby or young child when ingested.  · Avoid use  of all medications, including herbs, over-the-counter medications, not prescribed or suggested by your caregiver. Only take over-the-counter or prescription medicines for pain, discomfort, or fever as directed by your caregiver. Do not use aspirin, ibuprofen (Motrin®, Advil®, Nuprin®) or naproxen (Aleve®) unless OK'd by your caregiver.  · Let your caregiver also know about herbs you may be using.  · Alcohol is related to a number of birth defects. This includes fetal alcohol syndrome. All alcohol, in any form, should be avoided completely. Smoking will cause low birth rate and premature babies.  · Street/illegal drugs are very harmful to the baby. They are absolutely forbidden. A baby born to an addicted mother will be addicted at birth. The baby will go through the same withdrawal an adult does.  SEEK MEDICAL CARE IF:  You have any concerns or worries during your pregnancy. It is better to call with your questions if you feel they cannot wait, rather than worry about them.  DECISIONS ABOUT CIRCUMCISION  You may or may not know the sex of your baby. If you know your baby is a boy, it may be time to think about circumcision. Circumcision is the removal of the foreskin of the penis. This is the skin that covers the sensitive end of the penis. There is no proven medical need for this. Often this decision is made on what is popular at the time or based upon religious beliefs and social issues. You can discuss these issues with your caregiver or pediatrician.  SEEK IMMEDIATE MEDICAL CARE IF:   · An unexplained oral temperature above 102° F (38.9° C) develops, or as your caregiver suggests.  · You have leaking of fluid from the vagina (birth canal). If leaking membranes are suspected, take your temperature and tell your caregiver of this when you call.  · There is vaginal spotting, bleeding or passing clots. Tell your caregiver of the amount and how many pads are used.  · You develop a bad smelling vaginal discharge with  a change in the color from clear to white.  · You develop vomiting that lasts more than 24 hours.  · You develop chills or fever.  · You develop shortness of breath.  · You develop burning on urination.  · You loose more than 2 pounds of weight   or gain more than 2 pounds of weight or as suggested by your caregiver.  · You notice sudden swelling of your face, hands, and feet or legs.  · You develop belly (abdominal) pain. Round ligament discomfort is a common non-cancerous (benign) cause of abdominal pain in pregnancy. Your caregiver still must evaluate you.  · You develop a severe headache that does not go away.  · You develop visual problems, blurred or double vision.  · If you have not felt your baby move for more than 1 hour. If you think the baby is not moving as much as usual, eat something with sugar in it and lie down on your left side for an hour. The baby should move at least 4 to 5 times per hour. Call right away if your baby moves less than that.  · You fall, are in a car accident or any kind of trauma.  · There is mental or physical violence at home.  Document Released: 11/26/2001 Document Revised: 02/24/2012 Document Reviewed: 05/31/2009  [employer]® Patient Information ©2013 [employer].

## 2013-01-18 NOTE — Progress Notes (Signed)
For [place] and NST today. Late gestational discomforts discussed. C/S scheduled 01/22/13. Had symptomatic low blood sugar (48, 55, 55) 3 times at  about 4 am so stopped PM glyburide 3 nights ago. [place] 90's, PP's below 121. Advised to continue 2.5 mg in am only. NST reactive. NST only in 4 days.

## 2013-01-19 ENCOUNTER — Encounter (HOSPITAL_COMMUNITY): Payer: Self-pay

## 2013-01-20 ENCOUNTER — Encounter (HOSPITAL_COMMUNITY): Payer: Self-pay

## 2013-01-20 ENCOUNTER — Encounter (HOSPITAL_COMMUNITY)
Admission: RE | Admit: 2013-01-20 | Discharge: 2013-01-20 | Disposition: A | Discharge status: Home / Self Care | Payer: Medicaid Other | Source: Ambulatory Visit | Attending: Obstetrics & Gynecology | Admitting: Obstetrics & Gynecology

## 2013-01-20 VITALS — BP 120/80 | Ht 66.0 in | Wt 190.0 lb

## 2013-01-20 DIAGNOSIS — O34219 Maternal care for unspecified type scar from previous cesarean delivery: Secondary | ICD-10-CM

## 2013-01-20 DIAGNOSIS — O24419 Gestational diabetes mellitus in pregnancy, unspecified control: Secondary | ICD-10-CM

## 2013-01-20 HISTORY — DX: Type 2 diabetes mellitus without complications: E11.9

## 2013-01-20 LAB — CBC
MCHC: 32.1 g/dL (ref 30.0–36.0)
MCV: 77.5 fL — ABNORMAL LOW (ref 78.0–100.0)
Platelets: 164 10*3/uL (ref 150–400)
RDW: 16.3 % — ABNORMAL HIGH (ref 11.5–15.5)
WBC: 5.5 10*3/uL (ref 4.0–10.5)

## 2013-01-20 LAB — BASIC METABOLIC PANEL
BUN: 7 mg/dL (ref 6–23)
Chloride: 102 mEq/L (ref 96–112)
Glucose, Bld: 102 mg/dL — ABNORMAL HIGH (ref 70–99)
Potassium: 3.7 mEq/L (ref 3.5–5.1)

## 2013-01-20 NOTE — Patient Instructions (Signed)
Your procedure is scheduled on:01/22/13  Enter through the Main Entrance at :10 am Pick up desk phone and dial [phone] and inform us of your arrival.  Please call [phone] if you have any problems the morning of surgery.  Remember: Do not eat after midnight:Thursday  Water is ok until 0730 am on Friday Take these meds the morning of surgery with a sip of water:none----hold Glyburide on day of surgery  DO NOT wear jewelry, eye make-up, lipstick,body lotion, or dark fingernail polish. Do not shave for 48 hours prior to surgery.  If you are to be admitted after surgery, leave suitcase in car until your room has been assigned. Patients discharged on the day of surgery will not be allowed to drive home.

## 2013-01-21 ENCOUNTER — Ambulatory Visit (INDEPENDENT_AMBULATORY_CARE_PROVIDER_SITE_OTHER): Payer: Medicaid Other | Admitting: General Practice

## 2013-01-21 VITALS — BP 137/83 | Wt 189.4 lb

## 2013-01-21 DIAGNOSIS — O99810 Abnormal glucose complicating pregnancy: Secondary | ICD-10-CM

## 2013-01-21 NOTE — Progress Notes (Signed)
NST 01/21/13 reactive 

## 2013-01-21 NOTE — Progress Notes (Signed)
Pulse- 96 

## 2013-01-21 NOTE — Progress Notes (Deleted)
2ST 01/21/13 reactive

## 2013-01-22 ENCOUNTER — Encounter (HOSPITAL_COMMUNITY): Payer: Self-pay | Admitting: *Deleted

## 2013-01-22 ENCOUNTER — Inpatient Hospital Stay (HOSPITAL_COMMUNITY)
Admission: AD | Admit: 2013-01-22 | Discharge: 2013-01-25 | DRG: 766 | Disposition: A | Discharge status: Home / Self Care | Payer: Medicaid Other | Source: Ambulatory Visit | Attending: Obstetrics & Gynecology | Admitting: Obstetrics & Gynecology

## 2013-01-22 ENCOUNTER — Encounter (HOSPITAL_COMMUNITY): Payer: Self-pay | Admitting: Anesthesiology

## 2013-01-22 ENCOUNTER — Inpatient Hospital Stay (HOSPITAL_COMMUNITY): Payer: Medicaid Other | Admitting: Anesthesiology

## 2013-01-22 ENCOUNTER — Encounter (HOSPITAL_COMMUNITY)
Admission: AD | Disposition: A | Discharge status: Home / Self Care | Payer: Self-pay | Source: Ambulatory Visit | Attending: Obstetrics & Gynecology

## 2013-01-22 DIAGNOSIS — Z2989 Encounter for other specified prophylactic measures: Secondary | ICD-10-CM

## 2013-01-22 DIAGNOSIS — O09529 Supervision of elderly multigravida, unspecified trimester: Secondary | ICD-10-CM | POA: Diagnosis present

## 2013-01-22 DIAGNOSIS — O9902 Anemia complicating childbirth: Secondary | ICD-10-CM

## 2013-01-22 DIAGNOSIS — D509 Iron deficiency anemia, unspecified: Secondary | ICD-10-CM

## 2013-01-22 DIAGNOSIS — O26899 Other specified pregnancy related conditions, unspecified trimester: Secondary | ICD-10-CM | POA: Diagnosis present

## 2013-01-22 DIAGNOSIS — Z298 Encounter for other specified prophylactic measures: Secondary | ICD-10-CM

## 2013-01-22 DIAGNOSIS — O99814 Abnormal glucose complicating childbirth: Secondary | ICD-10-CM

## 2013-01-22 DIAGNOSIS — O34219 Maternal care for unspecified type scar from previous cesarean delivery: Secondary | ICD-10-CM

## 2013-01-22 DIAGNOSIS — Z6791 Unspecified blood type, Rh negative: Secondary | ICD-10-CM | POA: Diagnosis present

## 2013-01-22 DIAGNOSIS — O24419 Gestational diabetes mellitus in pregnancy, unspecified control: Secondary | ICD-10-CM | POA: Diagnosis present

## 2013-01-22 LAB — GLUCOSE, CAPILLARY: Glucose-Capillary: 64 mg/dL — ABNORMAL LOW (ref 70–99)

## 2013-01-22 SURGERY — Surgical Case
Anesthesia: Spinal | Site: Abdomen | Wound class: Clean Contaminated

## 2013-01-22 MED ORDER — LACTATED RINGERS IV SOLN
INTRAVENOUS | Status: DC
  Administered 2013-01-22: 18:00:00 via INTRAVENOUS

## 2013-01-22 MED ORDER — DIBUCAINE 1 % RE OINT: 1.0000 "application " | TOPICAL_OINTMENT | RECTAL | Status: DC | PRN

## 2013-01-22 MED ORDER — NALBUPHINE HCL 10 MG/ML IJ SOLN
5.0000 mg | INTRAMUSCULAR | Status: DC | PRN
  Filled 2013-01-22: qty 1

## 2013-01-22 MED ORDER — MEPERIDINE HCL 25 MG/ML IJ SOLN: 6.2500 mg | INTRAMUSCULAR | Status: DC | PRN

## 2013-01-22 MED ORDER — OXYTOCIN 10 UNIT/ML IJ SOLN
40.0000 [IU] | INTRAVENOUS | Status: DC | PRN
  Administered 2013-01-22: 40 [IU] via INTRAVENOUS

## 2013-01-22 MED ORDER — SENNOSIDES-DOCUSATE SODIUM 8.6-50 MG PO TABS
2.0000 | ORAL_TABLET | Freq: Every day | ORAL | Status: DC
  Administered 2013-01-23 – 2013-01-24 (×2): 2 via ORAL

## 2013-01-22 MED ORDER — TETANUS-DIPHTH-ACELL PERTUSSIS 5-2.5-18.5 LF-MCG/0.5 IM SUSP
0.5000 mL | Freq: Once | INTRAMUSCULAR | Status: DC
Start: 2013-01-23 — End: 2013-01-24

## 2013-01-22 MED ORDER — CEFAZOLIN SODIUM-DEXTROSE 2-3 GM-% IV SOLR
2.0000 g | INTRAVENOUS | Status: AC
  Administered 2013-01-22: 2 g via INTRAVENOUS

## 2013-01-22 MED ORDER — FENTANYL CITRATE 0.05 MG/ML IJ SOLN
INTRAMUSCULAR | Status: DC | PRN
  Administered 2013-01-22: 25 ug via INTRATHECAL

## 2013-01-22 MED ORDER — SCOPOLAMINE 1 MG/3DAYS TD PT72
MEDICATED_PATCH | TRANSDERMAL | Status: AC
  Administered 2013-01-22: 1.5 mg via TRANSDERMAL
  Filled 2013-01-22: qty 1

## 2013-01-22 MED ORDER — IBUPROFEN 600 MG PO TABS
600.0000 mg | ORAL_TABLET | Freq: Four times a day (QID) | ORAL | Status: DC
  Administered 2013-01-23 – 2013-01-25 (×10): 600 mg via ORAL
  Filled 2013-01-22 (×11): qty 1

## 2013-01-22 MED ORDER — SODIUM CHLORIDE 0.9 % IJ SOLN: 3.0000 mL | INTRAMUSCULAR | Status: DC | PRN

## 2013-01-22 MED ORDER — PRENATAL MULTIVITAMIN CH
1.0000 | ORAL_TABLET | Freq: Every day | ORAL | Status: DC
  Administered 2013-01-22 – 2013-01-24 (×4): 1 via ORAL
  Filled 2013-01-22 (×4): qty 1

## 2013-01-22 MED ORDER — CEFAZOLIN SODIUM-DEXTROSE 2-3 GM-% IV SOLR
INTRAVENOUS | Status: AC
  Filled 2013-01-22: qty 50

## 2013-01-22 MED ORDER — PHENYLEPHRINE HCL 10 MG/ML IJ SOLN
INTRAMUSCULAR | Status: DC | PRN
  Administered 2013-01-22: 120 ug via INTRAVENOUS
  Administered 2013-01-22 (×2): 80 ug via INTRAVENOUS

## 2013-01-22 MED ORDER — DIPHENHYDRAMINE HCL 50 MG/ML IJ SOLN: 12.5000 mg | INTRAMUSCULAR | Status: DC | PRN

## 2013-01-22 MED ORDER — BUPIVACAINE HCL (PF) 0.25 % IJ SOLN
INTRAMUSCULAR | Status: AC
  Filled 2013-01-22: qty 30

## 2013-01-22 MED ORDER — LACTATED RINGERS IV SOLN
Freq: Once | INTRAVENOUS | Status: AC
  Administered 2013-01-22: 11:00:00 via INTRAVENOUS

## 2013-01-22 MED ORDER — OXYTOCIN 40 UNITS IN LACTATED RINGERS INFUSION - SIMPLE MED: 62.5000 mL/h | INTRAVENOUS | Status: AC

## 2013-01-22 MED ORDER — ONDANSETRON HCL 4 MG/2ML IJ SOLN
INTRAMUSCULAR | Status: AC
  Filled 2013-01-22: qty 2

## 2013-01-22 MED ORDER — SIMETHICONE 80 MG PO CHEW
80.0000 mg | CHEWABLE_TABLET | ORAL | Status: DC | PRN
  Administered 2013-01-24: 80 mg via ORAL

## 2013-01-22 MED ORDER — ONDANSETRON HCL 4 MG/2ML IJ SOLN: 4.0000 mg | INTRAMUSCULAR | Status: DC | PRN

## 2013-01-22 MED ORDER — KETOROLAC TROMETHAMINE 30 MG/ML IJ SOLN
INTRAMUSCULAR | Status: AC
  Filled 2013-01-22: qty 1

## 2013-01-22 MED ORDER — KETOROLAC TROMETHAMINE 30 MG/ML IJ SOLN
30.0000 mg | Freq: Four times a day (QID) | INTRAMUSCULAR | Status: AC | PRN
  Administered 2013-01-22 (×2): 30 mg via INTRAVENOUS
  Filled 2013-01-22: qty 1

## 2013-01-22 MED ORDER — MORPHINE SULFATE (PF) 0.5 MG/ML IJ SOLN
INTRAMUSCULAR | Status: DC | PRN
  Administered 2013-01-22: .15 mg via INTRATHECAL

## 2013-01-22 MED ORDER — WITCH HAZEL-GLYCERIN EX PADS: 1.0000 "application " | MEDICATED_PAD | CUTANEOUS | Status: DC | PRN

## 2013-01-22 MED ORDER — FENTANYL CITRATE 0.05 MG/ML IJ SOLN
INTRAMUSCULAR | Status: AC
  Filled 2013-01-22: qty 2

## 2013-01-22 MED ORDER — LACTATED RINGERS IV SOLN
INTRAVENOUS | Status: DC
  Administered 2013-01-22 (×3): via INTRAVENOUS

## 2013-01-22 MED ORDER — LANOLIN HYDROUS EX OINT: 1.0000 "application " | TOPICAL_OINTMENT | CUTANEOUS | Status: DC | PRN

## 2013-01-22 MED ORDER — NALOXONE HCL 1 MG/ML IJ SOLN
1.0000 ug/kg/h | INTRAVENOUS | Status: DC | PRN
  Filled 2013-01-22: qty 2

## 2013-01-22 MED ORDER — OXYTOCIN 10 UNIT/ML IJ SOLN
INTRAMUSCULAR | Status: AC
  Filled 2013-01-22: qty 4

## 2013-01-22 MED ORDER — DIPHENHYDRAMINE HCL 50 MG/ML IJ SOLN: 25.0000 mg | INTRAMUSCULAR | Status: DC | PRN

## 2013-01-22 MED ORDER — ONDANSETRON HCL 4 MG PO TABS: 4.0000 mg | ORAL_TABLET | ORAL | Status: DC | PRN

## 2013-01-22 MED ORDER — BUPIVACAINE IN DEXTROSE 0.75-8.25 % IT SOLN
INTRATHECAL | Status: DC | PRN
  Administered 2013-01-22: 1.6 mL via INTRATHECAL

## 2013-01-22 MED ORDER — OXYCODONE-ACETAMINOPHEN 5-325 MG PO TABS
1.0000 | ORAL_TABLET | ORAL | Status: DC | PRN
  Filled 2013-01-22: qty 1

## 2013-01-22 MED ORDER — ONDANSETRON HCL 4 MG/2ML IJ SOLN
INTRAMUSCULAR | Status: DC | PRN
  Administered 2013-01-22: 4 mg via INTRAVENOUS

## 2013-01-22 MED ORDER — SCOPOLAMINE 1 MG/3DAYS TD PT72
1.0000 | MEDICATED_PATCH | Freq: Once | TRANSDERMAL | Status: DC
  Administered 2013-01-22: 1.5 mg via TRANSDERMAL

## 2013-01-22 MED ORDER — SIMETHICONE 80 MG PO CHEW
80.0000 mg | CHEWABLE_TABLET | Freq: Three times a day (TID) | ORAL | Status: DC
  Administered 2013-01-22 – 2013-01-25 (×11): 80 mg via ORAL

## 2013-01-22 MED ORDER — PHENYLEPHRINE 40 MCG/ML (10ML) SYRINGE FOR IV PUSH (FOR BLOOD PRESSURE SUPPORT)
PREFILLED_SYRINGE | INTRAVENOUS | Status: AC
  Filled 2013-01-22: qty 5

## 2013-01-22 MED ORDER — METOCLOPRAMIDE HCL 5 MG/ML IJ SOLN: 10.0000 mg | Freq: Three times a day (TID) | INTRAMUSCULAR | Status: DC | PRN

## 2013-01-22 MED ORDER — ZOLPIDEM TARTRATE 5 MG PO TABS: 5.0000 mg | ORAL_TABLET | Freq: Every evening | ORAL | Status: DC | PRN

## 2013-01-22 MED ORDER — KETOROLAC TROMETHAMINE 30 MG/ML IJ SOLN: 30.0000 mg | Freq: Four times a day (QID) | INTRAMUSCULAR | Status: AC | PRN

## 2013-01-22 MED ORDER — ONDANSETRON HCL 4 MG/2ML IJ SOLN: 4.0000 mg | Freq: Three times a day (TID) | INTRAMUSCULAR | Status: DC | PRN

## 2013-01-22 MED ORDER — DIPHENHYDRAMINE HCL 25 MG PO CAPS
25.0000 mg | ORAL_CAPSULE | ORAL | Status: DC | PRN
  Filled 2013-01-22: qty 1

## 2013-01-22 MED ORDER — FENTANYL CITRATE 0.05 MG/ML IJ SOLN: 25.0000 ug | INTRAMUSCULAR | Status: DC | PRN

## 2013-01-22 MED ORDER — DIPHENHYDRAMINE HCL 25 MG PO CAPS: 25.0000 mg | ORAL_CAPSULE | Freq: Four times a day (QID) | ORAL | Status: DC | PRN

## 2013-01-22 MED ORDER — IBUPROFEN 600 MG PO TABS: 600.0000 mg | ORAL_TABLET | Freq: Four times a day (QID) | ORAL | Status: DC | PRN

## 2013-01-22 MED ORDER — MENTHOL 3 MG MT LOZG: 1.0000 | LOZENGE | OROMUCOSAL | Status: DC | PRN

## 2013-01-22 MED ORDER — MORPHINE SULFATE 0.5 MG/ML IJ SOLN
INTRAMUSCULAR | Status: AC
  Filled 2013-01-22: qty 10

## 2013-01-22 MED ORDER — NALOXONE HCL 0.4 MG/ML IJ SOLN: 0.4000 mg | INTRAMUSCULAR | Status: DC | PRN

## 2013-01-22 MED ORDER — BUPIVACAINE HCL (PF) 0.5 % IJ SOLN
INTRAMUSCULAR | Status: DC | PRN
  Administered 2013-01-22: 30 mL

## 2013-01-22 SURGICAL SUPPLY — 40 items
BARRIER ADHS 3X4 INTERCEED (GAUZE/BANDAGES/DRESSINGS) IMPLANT
BRR ADH 4X3 ABS CNTRL BYND (GAUZE/BANDAGES/DRESSINGS)
CLOTH BEACON ORANGE TIMEOUT ST (SAFETY) ×2 IMPLANT
CONTAINER PREFILL 10% NBF 15ML (MISCELLANEOUS) IMPLANT
DRAPE LG THREE QUARTER DISP (DRAPES) ×2 IMPLANT
DRESSING TELFA 8X3 (GAUZE/BANDAGES/DRESSINGS) IMPLANT
DRSG OPSITE POSTOP 4X10 (GAUZE/BANDAGES/DRESSINGS) ×2 IMPLANT
DURAPREP 26ML APPLICATOR (WOUND CARE) ×2 IMPLANT
ELECT REM PT RETURN 9FT ADLT (ELECTROSURGICAL) ×2
ELECTRODE REM PT RTRN 9FT ADLT (ELECTROSURGICAL) ×1 IMPLANT
GAUZE SPONGE 4X4 12PLY STRL LF (GAUZE/BANDAGES/DRESSINGS) IMPLANT
GLOVE BIO SURGEON STRL SZ 6.5 (GLOVE) ×6 IMPLANT
GOWN PREVENTION PLUS LG XLONG (DISPOSABLE) ×7 IMPLANT
KIT ABG SYR 3ML LUER SLIP (SYRINGE) IMPLANT
NDL HYPO 25X5/8 SAFETYGLIDE (NEEDLE) IMPLANT
NDL SPNL 18GX3.5 QUINCKE PK (NEEDLE) ×1 IMPLANT
NEEDLE HYPO 25X5/8 SAFETYGLIDE (NEEDLE) IMPLANT
NEEDLE SPNL 18GX3.5 QUINCKE PK (NEEDLE) ×2 IMPLANT
NS IRRIG 1000ML POUR BTL (IV SOLUTION) ×2 IMPLANT
PACK C SECTION WH (CUSTOM PROCEDURE TRAY) ×2 IMPLANT
PAD ABD 7.5X8 STRL (GAUZE/BANDAGES/DRESSINGS) IMPLANT
PAD OB MATERNITY 4.3X12.25 (PERSONAL CARE ITEMS) ×2 IMPLANT
RTRCTR C-SECT PINK 25CM LRG (MISCELLANEOUS) ×1 IMPLANT
SLEEVE SCD COMPRESS KNEE MED (MISCELLANEOUS) IMPLANT
SUT PDS AB 0 CTX 60 (SUTURE) IMPLANT
SUT PLAIN 2 0 XLH (SUTURE) ×1 IMPLANT
SUT VIC AB 0 CT1 27 (SUTURE)
SUT VIC AB 0 CT1 27XBRD ANBCTR (SUTURE) IMPLANT
SUT VIC AB 0 CT1 36 (SUTURE) IMPLANT
SUT VIC AB 2-0 CT1 27 (SUTURE) ×2
SUT VIC AB 2-0 CT1 TAPERPNT 27 (SUTURE) ×1 IMPLANT
SUT VIC AB 2-0 CTX 36 (SUTURE) ×4 IMPLANT
SUT VIC AB 3-0 CT1 27 (SUTURE) ×2
SUT VIC AB 3-0 CT1 TAPERPNT 27 (SUTURE) ×1 IMPLANT
SUT VIC AB 3-0 SH 27 (SUTURE)
SUT VIC AB 3-0 SH 27X BRD (SUTURE) IMPLANT
SYR 30ML LL (SYRINGE) ×2 IMPLANT
TOWEL OR 17X24 6PK STRL BLUE (TOWEL DISPOSABLE) ×6 IMPLANT
TRAY FOLEY CATH 14FR (SET/KITS/TRAYS/PACK) ×2 IMPLANT
WATER STERILE IRR 1000ML POUR (IV SOLUTION) ×2 IMPLANT

## 2013-01-22 NOTE — Anesthesia Preprocedure Evaluation (Signed)
Anesthesia Evaluation  Patient identified by MRN, date of birth, ID band Patient awake    Reviewed: Allergy & Precautions, H&P , NPO status , Patient's Chart, lab work & pertinent test results  Airway Mallampati: II TM Distance: >3 FB Neck ROM: Full    Dental No notable dental hx. (+) Teeth Intact   Pulmonary neg pulmonary ROS,  breath sounds clear to auscultation  Pulmonary exam normal       Cardiovascular negative cardio ROS  Rhythm:Regular Rate:Normal     Neuro/Psych  Headaches, negative psych ROS   GI/Hepatic Neg liver ROS, PUD, GERD-  Medicated and Controlled,Hyperemesis gravidum   Endo/Other  diabetes, Well Controlled, Gestational, Oral Hypoglycemic Agents  Renal/GU negative Renal ROS  negative genitourinary   Musculoskeletal negative musculoskeletal ROS (+)   Abdominal (+) + obese,   Peds  Hematology negative hematology ROS (+)   Anesthesia Other Findings   Reproductive/Obstetrics (+) Pregnancy Previous C/Section                           Anesthesia Physical Anesthesia Plan  ASA: II  Anesthesia Plan: Spinal   Post-op Pain Management:    Induction:   Airway Management Planned: Natural Airway  Additional Equipment:   Intra-op Plan:   Post-operative Plan:   Informed Consent: I have reviewed the patients History and Physical, chart, labs and discussed the procedure including the risks, benefits and alternatives for the proposed anesthesia with the patient or authorized representative who has indicated his/her understanding and acceptance.   Dental advisory given  Plan Discussed with: CRNA, Anesthesiologist and Surgeon  Anesthesia Plan Comments:         Anesthesia Quick Evaluation

## 2013-01-22 NOTE — H&P (Signed)
Susan Hopkins is an 42 y.o. [id number] [redacted]w[redacted]d female.   Chief Complaint: Previous C-Section x 2, needs repeat  HPI: 42 y.o. [id number] @ [redacted]w[redacted]d with 2 prior C-Sections in need of elective repeat.  Past Medical History  Diagnosis Date  . ADVANCED MATERNAL AGE 76/02/2008  . DIABETES MELLITUS, GESTATIONAL, HX OF 05/18/2008  . 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 Contact dermatitis 08/27/2011  . 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 Migraine headache     last migraine in Sept 2012  . GERD (gastroesophageal reflux disease)     doesn't take anything for acid reflux  . Gastric ulcer     history of  . Hemorrhoids   . Constipation   . Diarrhea   . ANEMIA, IRON DEFICIENCY, UNSPEC. 02/12/2007    after IUD  . Diabetes mellitus without complication     gestational diab. currently & with infant #2    Past Surgical History  Procedure Date  . Cesarean section 2005/2010  . Female circumcision 1  . Tonsillectomy 1989  . Dilation and curettage of uterus 2009  . Cholecystectomy 11/28/2011    Procedure: LAPAROSCOPIC CHOLECYSTECTOMY WITH INTRAOPERATIVE CHOLANGIOGRAM;  Surgeon: Wilmon Arms. Corliss Skains, MD;  Location: [facility] OR;  Service: General;  Laterality: N/A;    Family History  Problem Relation Age of Onset  . Diabetes Mother   . Hypertension Mother   . Hyperlipidemia Mother   . Hypertension Father   . Hyperlipidemia Father   . Anesthesia problems Neg Hx   . Hypotension Neg Hx   . Malignant hyperthermia Neg Hx   . Pseudochol deficiency Neg Hx    Social History:  reports that she has never smoked. She has never used smokeless tobacco. She reports that she does not drink alcohol or use illicit drugs.  Allergies:  Allergies  Allergen Reactions  . Oxycodone Itching    All over the body    Medications Prior to Admission  Medication Sig Dispense Refill  . acetaminophen (TYLENOL) 325 MG tablet Take 650 mg by mouth every 6 (six) hours as needed. For  headache.      Marland Kitchen aluminum & magnesium hydroxide (MAALOX) 225-200 MG/5ML suspension Take 10 mLs by mouth every 6 (six) hours as needed. For indigestion      . Prenatal Multivit-Min-Fe-FA (PRENATAL VITAMINS) 0.8 MG tablet Take 1 tablet by mouth daily.  30 tablet  12  . ACCU-CHEK FASTCLIX LANCETS MISC 1 Units by Percutaneous route 4 (four) times daily.  100 each  12  . Blood Glucose Monitoring Suppl (ACCU-CHEK NANO SMARTVIEW) W/DEVICE KIT 1 Device by Does not apply route once.  1 kit  0  . glucose blood (ACCU-CHEK SMARTVIEW) test strip Check blood sugars 4x/daily  100 each  12  . glyBURIDE (DIABETA) 2.5 MG tablet TAKE 1 TABLET (2.5 MG TOTAL) BY MOUTH 2 (TWO) TIMES DAILY WITH A MEAL.  60 tablet  3     A comprehensive review of systems was negative.  Blood pressure 127/88, pulse 92, temperature 98.1 F (36.7 C), temperature source Oral, resp. rate 18, last menstrual period 05/02/2012, SpO2 100.00%. BP 127/88  Pulse 92  Temp 98.1 F (36.7 C) (Oral)  Resp 18  SpO2 100%  LMP 05/02/2012 General appearance: alert, cooperative and appears stated age Head: Normocephalic, without obvious abnormality, atraumatic Neck: no adenopathy, supple, symmetrical, trachea midline and thyroid not enlarged, symmetric, no tenderness/mass/nodules Lungs: clear to auscultation bilaterally Heart: regular rate and rhythm, S1, S2 normal, no murmur, click,  rub or gallop Abdomen: soft, non-tender; bowel sounds normal; no masses,  no organomegaly Extremities: extremities normal, atraumatic, no cyanosis or edema Skin: Skin color, texture, turgor normal. No rashes or lesions Neurologic: Grossly normal   Lab Results  Component Value Date   WBC 5.5 01/20/2013   HGB 11.6* 01/20/2013   HCT 36.1 01/20/2013   MCV 77.5* 01/20/2013   PLT 164 01/20/2013   Lab Results  Component Value Date   PREGTESTUR Positive 06/10/2012   PREGSERUM NEGATIVE 11/25/2011     Assessment/Plan Patient Active Problem List  Diagnosis  . ANEMIA,  IRON DEFICIENCY, UNSPEC.  Marland Kitchen GASTROESOPHAGEAL REFLUX, NO ESOPHAGITIS  . MIGRAINE HEADACHE  . Overweight (BMI 25.0-29.9)  . Hyperemesis gravidarum  . Gestational diabetes A2/B  . Rh negative status during pregnancy, antepartum  . Mammographic breast lesion  . Elevated liver enzymes  . Supervision of high-risk pregnancy of elderly multigravida  . Previous cesarean delivery, antepartum condition or complication   Repeat C-section.  Risks of repeat cesarean section reviewed.  Risks include but are not limited to bleeding, infection, injury to surrounding structures, hysterectomy, blood clots, and death.   [name] February 20, 2013, 10:29 AM

## 2013-01-22 NOTE — Transfer of Care (Signed)
Immediate Anesthesia Transfer of Care Note  Patient: Susan Hopkins  Procedure(s) Performed: Procedure(s) (LRB) with comments: CESAREAN SECTION (N/A) - Repeat  Patient Location: PACU  Anesthesia Type:Spinal  Level of Consciousness: awake, alert  and oriented  Airway & Oxygen Therapy: Patient Spontanous Breathing  Post-op Assessment: Report given to PACU RN and Post -op Vital signs reviewed and stable  Post vital signs: stable  Complications: No apparent anesthesia complications

## 2013-01-22 NOTE — Anesthesia Procedure Notes (Signed)
Spinal  Patient location during procedure: OR Start time: 01/22/2013 11:12 AM Staffing Anesthesiologist: Jaishon Krisher A. Performed by: anesthesiologist  Preanesthetic Checklist Completed: patient identified, site marked, surgical consent, pre-op evaluation, timeout performed, IV checked, risks and benefits discussed and monitors and equipment checked Spinal Block Patient position: sitting Prep: site prepped and draped and DuraPrep Patient monitoring: heart rate, cardiac monitor, continuous pulse ox and blood pressure Approach: midline Location: L3-4 Injection technique: single-shot Needle Needle type: Sprotte  Needle gauge: 24 G Needle length: 9 cm Needle insertion depth: 5 cm Assessment Sensory level: T4 Additional Notes Patient tolerated procedure well.  Adequate sensory level.

## 2013-01-22 NOTE — Op Note (Signed)
Susan Hopkins   Procedure Date:  01/22/2013  Preoperative Diagnosis:  IUP @ [redacted]w[redacted]d, Prior cesarean section x 2  Postoperative Diagnosis:  Same  Procedure: Repeat low transverse cesarean section  Surgeon: Tinnie Gens, M.D.  Assistant: Napoleon Form, MD  Findings: Viable female Infant, APGAR (1 MIN): 8 APGAR (5 MINS):  9, weight:  Pending.  Normal uterus, ovaries and tubes bilaterally.   Estimated blood loss: 500 cc  IV Fluids:  3400 ml LR  Urine Output:  150 clear urine  Complications: None known  Specimens: Placenta to pathology  Reason for procedure: Briefly, the patient is a 42 y.o. [id number] at [redacted]w[redacted]d presenting for scheduled repeat cesarean section for the indications listed above.  Procedure: Patient was taken to the OR where spinal analgesia was administered. She was then placed in a supine position with left lateral tilt. She received 2 g of Ancef and SCDs were in place. A timeout was performed. She was prepped and draped in the usual sterile fashion. A Foley catheter was placed in the bladder. A knife was then used to make a Pfannenstiel incision. This incision was carried out to underlying fascia which was divided in the midline with the knife. The incision was extended laterally, sharply.  The rectus was divided in the midline.  The peritoneal cavity was entered bluntly and with Mayo scissors.  Alexis retractor was placed inside the incision.  A knife was used to make a low transverse incision on the uterus. This incision was carried down to the amniotic cavity was entered. Fetus was in vertex, LOP position and was brought up out of the incision without difficulty. Cord was clamped x 2 and cut. Infant taken to waiting pediatrician.  Cord blood was obtained. Placenta was delivered from the uterus.  Uterus was cleaned with dry lap pads. Uterine incision closed with 0 Vicryl suture in a locked running fashion. Excellent hemostasis was observed. Alexis retractor was removed from the abdomen.  Peritoneal closure was done with 0 Vicryl suture in purse-string fashion.  Fascia is closed with 0 Vicryl suture in a running fashion.  Subcutaneous closure was performed with 0 plain suture.  Skin closed using 3-0 Vicryl on a Keith needle.  Subcutaneous tissue infused with 30cc 0.25% Marcaine. Steri strips applied, followed by pressure dressing.  All instrument, needle and lap counts were correct x 2.  Patient was awake and taken to PACU stable.  Infant to Newborn Nursery, stable.     Napoleon Form, MD 01/22/2013 12:21 PM

## 2013-01-22 NOTE — Anesthesia Postprocedure Evaluation (Signed)
  Anesthesia Post-op Note  Patient: Susan Hopkins  Procedure(s) Performed: Procedure(s) (LRB) with comments: CESAREAN SECTION (N/A) - Repeat  Patient Location: PACU  Anesthesia Type:Spinal  Level of Consciousness: awake, alert  and oriented  Airway and Oxygen Therapy: Patient Spontanous Breathing  Post-op Pain: none  Post-op Assessment: Post-op Vital signs reviewed, Patient's Cardiovascular Status Stable, Respiratory Function Stable, Patent Airway, No signs of Nausea or vomiting, Pain level controlled, No headache, No backache and No residual numbness  Post-op Vital Signs: Reviewed and stable  Complications: No apparent anesthesia complications\

## 2013-01-23 LAB — GLUCOSE, CAPILLARY
Glucose-Capillary: 123 mg/dL — ABNORMAL HIGH (ref 70–99)
Glucose-Capillary: 95 mg/dL (ref 70–99)

## 2013-01-23 LAB — CBC
HCT: 34.1 % — ABNORMAL LOW (ref 36.0–46.0)
Hemoglobin: 10.6 g/dL — ABNORMAL LOW (ref 12.0–15.0)
MCH: 24.1 pg — ABNORMAL LOW (ref 26.0–34.0)
MCV: 77.5 fL — ABNORMAL LOW (ref 78.0–100.0)
Platelets: 150 10*3/uL (ref 150–400)
RBC: 4.4 MIL/uL (ref 3.87–5.11)
WBC: 7 10*3/uL (ref 4.0–10.5)

## 2013-01-23 LAB — KLEIHAUER-BETKE STAIN: # Vials RhIg: 1

## 2013-01-23 MED ORDER — HYDROMORPHONE HCL 2 MG PO TABS
2.0000 mg | ORAL_TABLET | Freq: Four times a day (QID) | ORAL | Status: DC | PRN
  Administered 2013-01-23 – 2013-01-25 (×8): 2 mg via ORAL
  Filled 2013-01-23 (×8): qty 1

## 2013-01-23 MED ORDER — RHO D IMMUNE GLOBULIN 1500 UNIT/2ML IJ SOLN
300.0000 ug | Freq: Once | INTRAMUSCULAR | Status: AC
  Administered 2013-01-23: 300 ug via INTRAVENOUS
  Filled 2013-01-23: qty 2

## 2013-01-23 NOTE — Progress Notes (Signed)
Subjective: Postpartum Day 1: Cesarean Delivery Patient reports: no flatus, no BM, no nausea, no vomiting. Pain controled with ibuprofen.    Objective: Vital signs in last 24 hours: Temp:  [97.3 F (36.3 C)-99.5 F (37.5 C)] 98.1 F (36.7 C) (02/08 0645) Pulse Rate:  [66-92] 89 (02/08 0645) Resp:  [14-20] 18 (02/08 0645) BP: (105-141)/(54-90) 123/74 mmHg (02/08 0645) SpO2:  [96 %-100 %] 97 % (02/08 0645) Weight:  [189 lb (85.73 kg)] 189 lb (85.73 kg) (02/07 1441)  Physical Exam:  General: alert, cooperative and breastfeeding with baby on phototherapy Lochia: appropriate Uterine Fundus: firm Incision: no significant drainage DVT Evaluation: No evidence of DVT seen on physical exam. Negative Homan's sign.   Recent Labs  01/20/13 1024 01/23/13 0503  HGB 11.6* 10.6*  HCT 36.1 34.1*    Assessment/Plan: Status post Cesarean section. Doing well postoperatively.  - GDM: CBG's post-op:63-93. Continue to monitor.  - patient requests circ in hospital. She is aware of price difference between hospital and outpatient - breastfeeding - baby with jaundice, on phototherapy - contraception: IUD at [facility] - Continue current care.  Susan Hopkins 01/23/2013, 8:48 AM

## 2013-01-23 NOTE — Progress Notes (Signed)
I examined patient and agree with resident's note and plan of care.  Eating, drinking, voiding, ambulating well. No flatus yet. Lochia and pain wnl.  No complaints.  Desires IP 'Jewish' circumcision, infant on phototherapy at present time.  Dr. Erin Fulling aware and will come speak w/ pt.  Cheral Marker, CNM, WHNP-BC

## 2013-01-24 LAB — RH IG WORKUP (INCLUDES ABO/RH): ABO/RH(D): O POS

## 2013-01-24 LAB — GLUCOSE, CAPILLARY: Glucose-Capillary: 100 mg/dL — ABNORMAL HIGH (ref 70–99)

## 2013-01-24 NOTE — Progress Notes (Cosign Needed)
Subjective: Postpartum Day 2: Cesarean Delivery Patient reports: flatus, no BM, no nausea, no vomiting. Pain controled with ibuprofen and dilaudid  Objective: Vital signs in last 24 hours: Temp:  [97.8 F (36.6 C)-98.3 F (36.8 C)] 97.8 F (36.6 C) (02/09 0622) Pulse Rate:  [76-82] 79 (02/09 0622) Resp:  [18-20] 18 (02/09 0622) BP: (102-137)/(68-84) 112/72 mmHg (02/09 0622) SpO2:  [98 %] 98 % (02/08 1100)  Physical Exam:  General: alert, cooperative and breastfeeding with baby on phototherapy CV: 2/6 SEM. Likely flow murmur.  Lochia: appropriate Uterine Fundus: firm Incision: no significant drainage DVT Evaluation: No evidence of DVT seen on physical exam. Negative Homan's sign.   Recent Labs  01/23/13 0503  HGB 10.6*  HCT 34.1*    Assessment/Plan: Status post Cesarean section. Doing well postoperatively.  - GDM: CBG's post-op:90-123. Discontinue CBG. Monitoring prolonged as 2 hour was positive very early in pregnancy around 10 weeks.   - patient requests circ in hospital. She is aware of price difference between hospital and outpatient. Plan will be to try to arrange when patient not requiring phototherapy (per peds) - breastfeeding with some bottle planned - baby with jaundice, on phototherapy - contraception: IUD at [facility] - Continue current care. - d/c tomorrow.   Jude Naclerio 01/24/2013, 9:21 AM

## 2013-01-25 ENCOUNTER — Encounter (HOSPITAL_COMMUNITY): Payer: Self-pay | Admitting: Family Medicine

## 2013-01-25 LAB — COMPREHENSIVE METABOLIC PANEL
ALT: 19 U/L (ref 0–35)
AST: 22 U/L (ref 0–37)
Alkaline Phosphatase: 82 U/L (ref 39–117)
CO2: 27 mEq/L (ref 19–32)
GFR calc Af Amer: >90 mL/min (ref >90)
GFR calc non Af Amer: >90 mL/min (ref >90)
Glucose, Bld: 75 mg/dL (ref 70–99)
Potassium: 3.9 mEq/L (ref 3.5–5.1)
Sodium: 138 mEq/L (ref 135–145)

## 2013-01-25 LAB — CBC
Hemoglobin: 11.2 g/dL — ABNORMAL LOW (ref 12.0–15.0)
Platelets: 185 10*3/uL (ref 150–400)
RBC: 4.58 MIL/uL (ref 3.87–5.11)
WBC: 6.4 10*3/uL (ref 4.0–10.5)

## 2013-01-25 MED ORDER — SENNOSIDES-DOCUSATE SODIUM 8.6-50 MG PO TABS: 2.0000 | ORAL_TABLET | Freq: Every day | ORAL | Status: DC | PRN

## 2013-01-25 MED ORDER — IBUPROFEN 600 MG PO TABS: 600.0000 mg | ORAL_TABLET | Freq: Four times a day (QID) | ORAL | Status: DC

## 2013-01-25 MED ORDER — HYDROMORPHONE HCL 2 MG PO TABS: 2.0000 mg | ORAL_TABLET | Freq: Four times a day (QID) | ORAL | Status: DC | PRN

## 2013-01-25 MED ORDER — PRENATAL VITAMINS 0.8 MG PO TABS: 1.0000 | ORAL_TABLET | Freq: Every day | ORAL | Status: DC

## 2013-01-25 NOTE — Discharge Summary (Signed)
Attestation of Attending Supervision of Advanced Practitioner (CNM/NP): Evaluation and management procedures were performed by the Advanced Practitioner under my supervision and collaboration.  I have reviewed the Advanced Practitioner's note and chart, and I agree with the management and plan.  Yadir Zentner 01/25/2013 8:11 AM

## 2013-01-25 NOTE — Progress Notes (Signed)
UR chart review completed.  

## 2013-01-25 NOTE — Discharge Summary (Signed)
Post Op Day 3:   S: Susan Hopkins is a 42 y.o. female [phone] doing well this morning. She is a little concerned about her blood pressure being elevated the night before and this morning. She also is complaining of headache, which she attributes to her not sleeping well. She is not currently taking BP medication. She also plans to stay with her baby, who is currently undergoing phototherapy. Pt denies CP,   Obstetric Discharge Summary Reason for Admission: cesarean section Prenatal Procedures: Preeclampsia Intrapartum Procedures: cesarean: low cervical, transverse Postpartum Procedures: none Complications-Operative and Postpartum: none Hemoglobin  Date Value Range Status  01/23/2013 10.6* 12.0 - 15.0 g/dL Final     HCT  Date Value Range Status  01/23/2013 34.1* 36.0 - 46.0 % Final   Physical Exam:  General: alert, cooperative and no distress Lochia: appropriate Uterine Fundus: soft Incision: healing well, no significant erythema, slight clear drainage present DVT Evaluation: No evidence of DVT seen on physical exam. Negative Homan's sign.  Discharge Diagnoses: Term Pregnancy-delivered  Discharge Information: Date: 01/25/2013 Activity: pelvic rest Diet: routine Medications: Ibuprofen Condition: stable, mildly elevated blood pressure  Instructions: refer to practice specific booklet  Discharge to: Patient may stay in mother-baby unit with baby who is currently undergoing phototherapy. Pediatrician will check bilirubin levels later today to see if discharge is possible today.   Assessment and Plan:   Elevated BP: protein creatinine ratio, CBC.  Contraception: IUD to be placed in 6 weeks. Breastfeeding: doing well Circumcision: with Dr. Durene Cal   Newborn Data: Live born female  Birth Weight: 7 lb 3 oz (3260 g) APGAR: 8, 9  [place] 01/25/2013, 7:38 AM  I have seen the patient with the resident/student and agree with the above.  Tawnya Crook

## 2013-02-01 NOTE — Progress Notes (Signed)
NST 01/08/13 reactive 

## 2013-03-04 ENCOUNTER — Ambulatory Visit (INDEPENDENT_AMBULATORY_CARE_PROVIDER_SITE_OTHER): Payer: Medicaid Other | Admitting: Obstetrics & Gynecology

## 2013-03-04 ENCOUNTER — Encounter: Payer: Self-pay | Admitting: Obstetrics & Gynecology

## 2013-03-04 VITALS — BP 121/80 | HR 75 | Temp 97.2°F | Ht 66.0 in | Wt 176.0 lb

## 2013-03-04 DIAGNOSIS — O24419 Gestational diabetes mellitus in pregnancy, unspecified control: Secondary | ICD-10-CM

## 2013-03-04 NOTE — Patient Instructions (Addendum)
For the 2 hour sugar test, come fasting, no food or drink other than water after midnight. Levonorgestrel intrauterine device (IUD) What is this medicine? LEVONORGESTREL IUD (LEE voe nor jes trel) is a contraceptive (birth control) device. It is used to prevent pregnancy and to treat heavy bleeding that occurs during your period. It can be used for up to 5 years. This medicine may be used for other purposes; ask your health care provider or pharmacist if you have questions. What should I tell my health care provider before I take this medicine? They need to know if you have any of these conditions: -abnormal Pap smear -cancer of the breast, uterus, or cervix -diabetes -endometritis -genital or pelvic infection now or in the past -have more than one sexual partner or your partner has more than one partner -heart disease -history of an ectopic or tubal pregnancy -immune system problems -IUD in place -liver disease or tumor -problems with blood clots or take blood-thinners -use intravenous drugs -uterus of unusual shape -vaginal bleeding that has not been explained -an unusual or allergic reaction to levonorgestrel, other hormones, silicone, or polyethylene, medicines, foods, dyes, or preservatives -pregnant or trying to get pregnant -breast-feeding How should I use this medicine? This device is placed inside the uterus by a health care professional. Talk to your pediatrician regarding the use of this medicine in children. Special care may be needed. Overdosage: If you think you have taken too much of this medicine contact a poison control center or emergency room at once. NOTE: This medicine is only for you. Do not share this medicine with others. What if I miss a dose? This does not apply. What may interact with this medicine? Do not take this medicine with any of the following medications: -amprenavir -bosentan -fosamprenavir This medicine may also interact with the following  medications: -aprepitant -barbiturate medicines for inducing sleep or treating seizures -bexarotene -griseofulvin -medicines to treat seizures like carbamazepine, ethotoin, felbamate, oxcarbazepine, phenytoin, topiramate -modafinil -pioglitazone -rifabutin -rifampin -rifapentine -some medicines to treat HIV infection like atazanavir, indinavir, lopinavir, nelfinavir, tipranavir, ritonavir -St. John's wort -warfarin This list may not describe all possible interactions. Give your health care provider a list of all the medicines, herbs, non-prescription drugs, or dietary supplements you use. Also tell them if you smoke, drink alcohol, or use illegal drugs. Some items may interact with your medicine. What should I watch for while using this medicine? Visit your doctor or health care professional for regular check ups. See your doctor if you or your partner has sexual contact with others, becomes HIV positive, or gets a sexual transmitted disease. This product does not protect you against HIV infection (AIDS) or other sexually transmitted diseases. You can check the placement of the IUD yourself by reaching up to the top of your vagina with clean fingers to feel the threads. Do not pull on the threads. It is a good habit to check placement after each menstrual period. Call your doctor right away if you feel more of the IUD than just the threads or if you cannot feel the threads at all. The IUD may come out by itself. You may become pregnant if the device comes out. If you notice that the IUD has come out use a backup birth control method like condoms and call your health care provider. Using tampons will not change the position of the IUD and are okay to use during your period. What side effects may I notice from receiving this medicine? Side effects  that you should report to your doctor or health care professional as soon as possible: -allergic reactions like skin rash, itching or hives, swelling  of the face, lips, or tongue -fever, flu-like symptoms -genital sores -high blood pressure -no menstrual period for 6 weeks during use -pain, swelling, warmth in the leg -pelvic pain or tenderness -severe or sudden headache -signs of pregnancy -stomach cramping -sudden shortness of breath -trouble with balance, talking, or walking -unusual vaginal bleeding, discharge -yellowing of the eyes or skin Side effects that usually do not require medical attention (report to your doctor or health care professional if they continue or are bothersome): -acne -breast pain -change in sex drive or performance -changes in weight -cramping, dizziness, or faintness while the device is being inserted -headache -irregular menstrual bleeding within first 3 to 6 months of use -nausea This list may not describe all possible side effects. Call your doctor for medical advice about side effects. You may report side effects to FDA at 1-800-FDA-1088. Where should I keep my medicine? This does not apply. NOTE: This sheet is a summary. It may not cover all possible information. If you have questions about this medicine, talk to your doctor, pharmacist, or health care provider.  2012, Elsevier/Gold Standard. (12/23/2008 6:39:08 PM)

## 2013-03-04 NOTE — Progress Notes (Signed)
Subjective:     Susan Hopkins is a 42 y.o. [id number] who presents for a postpartum visit. She is postpartum following a low cervical transverse Cesarean section on 01/22/13 for elective repeat C-section. Pregnancy was complicated by gestational diabetes on glyburide 2.5mg  bid. She denies any h/o DM outside of pregnancy. The delivery was at 39 gestational weeks. Outcome: repeat cesarean section, low transverse incision.  Postpartum course has been uncomplicated.  Baby is feeding by breast. Some irritation with breast feeding. Using lanolin ointment.  Bleeding bright red blood like her period that started 03/01/13.Bowel function is normal. Bladder function is normal. Patient is not sexually active. Patient plans on mirena IUD. Postpartum depression screening: negative.   Review of Systems Pertinent items are noted in HPI.   Objective:    BP 121/80  Pulse 75  Temp(Src) 97.2 F (36.2 C) (Oral)  Ht 5\' 6"  (1.676 m)  Wt 176 lb (79.833 kg)  BMI 28.42 kg/m2  Breastfeeding? Yes  General: no acute distress, appears stated age CV: S1S2, RRR, 2/6 systolic flow murmur best heard at right sternal border.  Abdomen: soft, non tender, non distended, well healed low transverse incision.   Pelvic: deferred to IUD insertion appointment Assessment:    6 weeks postpartum exam s/p repeat LTCS with h/o gestational diabetes. Doing well with well healed incision.   Plan:    1. Contraception: IUD scheduled next week.  2. Gestational Diabetes on glyburide (A2): patient scheduled for 2hr GTT on Monday fasting.  3. Breastfeeding: patient to follow up with lactation consultant to help with breastfeeding comfort.  4. Follow up for IUD insert next week.   Attestation of Attending Supervision of Resident: Evaluation and management procedures were performed by the [facility] Medicine Resident under my supervision.  I have seen and examined the patient, reviewed the resident's note and chart, and I agree with the management  and plan.  Anibal Henderson, M.D. 03/04/2013 4:46 PM

## 2013-03-08 ENCOUNTER — Ambulatory Visit (INDEPENDENT_AMBULATORY_CARE_PROVIDER_SITE_OTHER): Payer: Medicaid Other | Admitting: Obstetrics and Gynecology

## 2013-03-08 ENCOUNTER — Other Ambulatory Visit: Payer: Self-pay | Admitting: Obstetrics and Gynecology

## 2013-03-08 ENCOUNTER — Other Ambulatory Visit: Payer: Medicaid Other

## 2013-03-08 ENCOUNTER — Encounter: Payer: Self-pay | Admitting: Obstetrics and Gynecology

## 2013-03-08 VITALS — BP 122/81 | HR 76 | Ht 66.0 in | Wt 176.0 lb

## 2013-03-08 DIAGNOSIS — Z30430 Encounter for insertion of intrauterine contraceptive device: Secondary | ICD-10-CM

## 2013-03-08 DIAGNOSIS — O24419 Gestational diabetes mellitus in pregnancy, unspecified control: Secondary | ICD-10-CM

## 2013-03-08 LAB — POCT PREGNANCY, URINE: Preg Test, Ur: NEGATIVE

## 2013-03-08 NOTE — Progress Notes (Signed)
Patient ID: Susan Hopkins, female   DOB: October 04, 1971, 42 y.o.   MRN: [id number] IUD Procedure Note Patient identified, informed consent performed, signed copy in chart, time out was performed.  Urine pregnancy test negative.  Speculum placed in the vagina.  Cervix visualized.  Cleaned with Betadine x 2.  Grasped anteriorly with a single tooth tenaculum.  Uterus sounded to 7 cm.  Mirena IUD placed per manufacturer's recommendations.  Strings trimmed to 3 cm. Tenaculum was removed, good hemostasis noted.  Patient tolerated procedure well.   Patient given post procedure instructions and Mirena care card with expiration date.  Patient is asked to check IUD strings periodically and follow up in 4-6 weeks for IUD check.  Patient completed 2 hr GCT this morning.

## 2013-03-09 LAB — GLUCOSE TOLERANCE, 2 HOURS W/ 1HR
Glucose, 2 hour: 85 mg/dL (ref 70–139)
Glucose, Fasting: 76 mg/dL (ref 70–99)

## 2013-03-10 ENCOUNTER — Encounter (HOSPITAL_COMMUNITY): Payer: Medicaid Other

## 2013-03-10 ENCOUNTER — Encounter: Payer: Self-pay | Admitting: *Deleted

## 2013-03-10 ENCOUNTER — Ambulatory Visit (HOSPITAL_COMMUNITY)
Admission: RE | Admit: 2013-03-10 | Discharge: 2013-03-10 | Disposition: A | Discharge status: Home / Self Care | Payer: Medicaid Other | Source: Ambulatory Visit | Attending: Obstetrics and Gynecology | Admitting: Obstetrics and Gynecology

## 2013-03-10 ENCOUNTER — Telehealth: Payer: Self-pay | Admitting: *Deleted

## 2013-03-10 NOTE — Telephone Encounter (Signed)
Message copied by Mannie Stabile on Wed Mar 10, 2013 12:58 PM ------      Message from: Willodean Rosenthal      Created: Wed Mar 10, 2013  9:28 AM       Please inform pt that her 2 hour GTT was negative.            clh-S ------

## 2013-03-10 NOTE — Lactation Note (Signed)
Adult Lactation Consultation Outpatient Visit Note  Patient Name: Susan Hopkins   Patient name: [name] Date of Birth: 15-Mar-1971    DOB: [birth year] Gestational Age at Delivery: Unknown  BW: 3260g (7# 3 oz) Type of Delivery: C/S     Today's weight: 11# 6.0 oz  Breastfeeding History: Frequency of Breastfeeding: q1h-q2h (q4 during night) Length of Feeding: 15-30 min  Voids: clear Stools: yellow/seedy  Supplementing / Method: Pumping:  Type of Pump:   Frequency: bid-tid (X 15 min)  Volume:  4 oz + each session  Comments: No discomfort w/pumping No formula has been given  Consultation Evaluation:  Initial Feeding Assessment: Pre-feed Weight: 5160g Post-feed Weight: 5242g Amount Transferred: 82mL in about 15 min Comments: R breast  Total Breast milk Transferred this Visit: 82mL  Follow-Up Mom came in today w/2 concerns: 1. Pediatrician had told her baby had a nursing blister.  Based on her understanding of the English word, "blister," she thought that was concerning. I explained that a nursing blister is normal and innocuous.  On oral exam baby noted to have excellent tongue mobility w/a slight upper labial frenum that may prevent baby from flanging upper lip, thus causing the nursing blister.  However, Mom understands this is no cause for concern.   2. 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  Mom has no other dermatological concerns (no burning, itching, no worsening of symptoms).  Mom aware that she can use lanolin if desired, but again, this is also not of any concern.    During consult it was noted that Mom's nipple is very slightly pink, which is most likely from the baby's latch.  Mom's nipple noted to be pinched when baby releases latch.  Mom has an excellent milk supply & fast letdown (baby can be heard gulping & baby will sometimes come off to stop the flow). Baby seems to be limiting depth of his latch to control milk flow.  Mom shown how to  feed in laid-back position.  Mom is not suffering from oversupply. Mom pleased that all is WNL.        Lurline Hare [facility] 03/10/2013, 1:05 PM

## 2013-03-10 NOTE — Telephone Encounter (Signed)
Pt informed

## 2013-03-29 ENCOUNTER — Ambulatory Visit (INDEPENDENT_AMBULATORY_CARE_PROVIDER_SITE_OTHER): Payer: Medicaid Other | Admitting: Obstetrics and Gynecology

## 2013-03-29 ENCOUNTER — Encounter: Payer: Self-pay | Admitting: Obstetrics and Gynecology

## 2013-03-29 VITALS — BP 116/72 | HR 77 | Temp 98.0°F | Ht 66.0 in | Wt 176.0 lb

## 2013-03-29 DIAGNOSIS — Z30431 Encounter for routine checking of intrauterine contraceptive device: Secondary | ICD-10-CM

## 2013-03-29 NOTE — Progress Notes (Signed)
Patient ID: Susan Hopkins, female   DOB: Jan 25, 1971, 41 y.o.   MRN: [id number] 42 yo s/p IUD placement on 3/24 presenting today for string check. Patient reports irregular bleeding since IUD placement and her husband feels the strings.  GENERAL: Well-developed, well-nourished female in no acute distress.  PELVIC: Normal external female genitalia. Vagina is pink and rugated.  Normal discharge. Normal appearing cervix. IUD strings visualized at os. Trimmed to 1 cm. EXTREMITIES: No cyanosis, clubbing, or edema, 2+ distal pulses.  A/P 42 yo here for string check - Reassurance provided on irregular bleeding. Patient informed that abnormal bleeding may last 3-6 months - Strings trimmed to 1 cm - RTC prn

## 2013-04-23 ENCOUNTER — Other Ambulatory Visit: Payer: Self-pay | Admitting: *Deleted

## 2013-09-16 ENCOUNTER — Other Ambulatory Visit: Payer: Self-pay | Admitting: Family Medicine

## 2013-10-21 ENCOUNTER — Encounter: Payer: Self-pay | Admitting: Family Medicine

## 2013-10-21 ENCOUNTER — Ambulatory Visit (INDEPENDENT_AMBULATORY_CARE_PROVIDER_SITE_OTHER): Payer: Self-pay | Admitting: Family Medicine

## 2013-10-21 ENCOUNTER — Other Ambulatory Visit: Payer: Self-pay

## 2013-10-21 VITALS — BP 125/80 | HR 84 | Temp 98.1°F | Ht 66.0 in | Wt 167.0 lb

## 2013-10-21 DIAGNOSIS — O99810 Abnormal glucose complicating pregnancy: Secondary | ICD-10-CM

## 2013-10-21 DIAGNOSIS — R928 Other abnormal and inconclusive findings on diagnostic imaging of breast: Secondary | ICD-10-CM

## 2013-10-21 DIAGNOSIS — G43909 Migraine, unspecified, not intractable, without status migrainosus: Secondary | ICD-10-CM

## 2013-10-21 DIAGNOSIS — O24419 Gestational diabetes mellitus in pregnancy, unspecified control: Secondary | ICD-10-CM

## 2013-10-21 MED ORDER — SUMATRIPTAN SUCCINATE 50 MG PO TABS: 50.0000 mg | ORAL_TABLET | ORAL | Status: DC | PRN

## 2013-10-21 NOTE — Patient Instructions (Addendum)
We gave you sumatriptan to try to help with future migraines. We are glad your migraine today is better. I would try to avoid breastfeeding for 12 hours after using the medication (pump and dump). Try to build up a supply if you can so you will have it when needed.   I am glad your little one is doing well!   See me as needed or yearly,  Dr. Durene Cal  Health Maintenance Due  Topic Date Due  . Influenza Vaccine  07/16/2013

## 2013-10-22 ENCOUNTER — Encounter: Payer: Self-pay | Admitting: Family Medicine

## 2013-10-22 MED ORDER — PRENATAL VITAMINS 0.8 MG PO TABS: 1.0000 | ORAL_TABLET | Freq: Every day | ORAL | Status: DC

## 2013-10-22 NOTE — Assessment & Plan Note (Signed)
Needs screening mammogram q12 months after she stops breastfeeding per last breast ultrasound. Still breastfeeding at present time.

## 2013-10-22 NOTE — Assessment & Plan Note (Signed)
Offered patient migraine cocktail in office with solumedrol 40, phenergan 12.5, and Toradol 15) but she states her HA was doing much better by the end of the visit below 3/10. Reassuring neurological exam.   Patient states she would just like to have sumatriptan on hand when she has headaches and this was provided. Advised patient if she takes hydromorphone, sumatriptan or ibuprofen to pump and dump for 12 hours to be abundantly cautious although from review, these would all likely be reasonable while breastfeeding.

## 2013-10-22 NOTE — Progress Notes (Signed)
[facility] Tana Conch, MD Phone: [phone]  Subjective:  Chief complaint-noted  # Migraine Headaches Typically gets 3-4x per year. Most recent headache lasting 5 days. Started out feeling like it was in her sinuses but then has moved to right side of her head only. Dull throbbing pain associated with nausea. Typical of most migraines except no aura which she often gets. She has taekn ibuprofen 600mg  and hydromophone for several days and this has helped her tolerate it. She used to be on sumatriptan which worked better for her. She wants to know what she can take given that she is breastfeeding. Current headache 3/10. She has been avoiding loud noises and light during these 5 days when possibe.  ROS-no vomiting. No blurry vision. No tinnitus. No weakness in an extremity. No lightheadedness.   Past Medical History Patient Active Problem List   Diagnosis Date Noted  . Previous cesarean delivery, antepartum condition or complication 11/30/2012  . Supervision of high-risk pregnancy of elderly multigravida 09/07/2012  . Elevated liver enzymes 08/26/2012  . Mammographic breast lesion 07/30/2012  . Rh negative status during pregnancy, antepartum 07/28/2012  . Gestational diabetes A2/B 07/20/2012  . Hyperemesis gravidarum 07/07/2012  . Overweight (BMI 25.0-29.9) 10/15/2011  . MIGRAINE HEADACHE 01/09/2011  . ANEMIA, IRON DEFICIENCY, UNSPEC. 02/12/2007  . GASTROESOPHAGEAL REFLUX, NO ESOPHAGITIS 02/12/2007    Medications- reviewed and updated Current Outpatient Prescriptions on File Prior to Visit  Medication Sig Dispense Refill  . ibuprofen (ADVIL,MOTRIN) 600 MG tablet Take 1 tablet (600 mg total) by mouth every 6 (six) hours. Take every 6 hours until no longer taking dilaudid (the stronger pain med)  30 tablet  0  . Prenatal Multivit-Min-Fe-FA (PRENATAL VITAMINS) 0.8 MG tablet Take 1 tablet by mouth daily.  30 tablet  12  . prenatal vitamin w/FE, FA (PRENATAL 1 +  1) 27-1 MG TABS tablet TAKE 1 TABLET EVERY DAY  30 tablet  5   No current facility-administered medications on file prior to visit.    Objective: BP 125/80  Pulse 84  Temp(Src) 98.1 F (36.7 C) (Oral)  Ht 5\' 6"  (1.676 m)  Wt 167 lb (75.751 kg)  BMI 26.97 kg/m2 Gen: NAD, resting comfortably CV: RRR no murmurs rubs or gallops Lungs: CTAB no crackles, wheeze, rhonchi Ext: no edema Neuro: CN II-XII intact, sensation and reflexes normal throughout, 5/5 muscle strength in bilateral upper and lower extremities. Normal finger to nose. Normal rapid alternating movements.  Assessment/Plan:

## 2013-11-23 ENCOUNTER — Other Ambulatory Visit: Payer: Self-pay | Admitting: Family Medicine

## 2013-12-11 ENCOUNTER — Encounter (HOSPITAL_COMMUNITY): Payer: Self-pay | Admitting: Emergency Medicine

## 2013-12-11 DIAGNOSIS — B9789 Other viral agents as the cause of diseases classified elsewhere: Secondary | ICD-10-CM | POA: Insufficient documentation

## 2013-12-11 DIAGNOSIS — Z885 Allergy status to narcotic agent status: Secondary | ICD-10-CM | POA: Insufficient documentation

## 2013-12-11 DIAGNOSIS — K219 Gastro-esophageal reflux disease without esophagitis: Secondary | ICD-10-CM | POA: Insufficient documentation

## 2013-12-11 DIAGNOSIS — Z8742 Personal history of other diseases of the female genital tract: Secondary | ICD-10-CM | POA: Insufficient documentation

## 2013-12-11 DIAGNOSIS — Z8719 Personal history of other diseases of the digestive system: Secondary | ICD-10-CM | POA: Insufficient documentation

## 2013-12-11 DIAGNOSIS — R509 Fever, unspecified: Secondary | ICD-10-CM | POA: Insufficient documentation

## 2013-12-11 DIAGNOSIS — Z79899 Other long term (current) drug therapy: Secondary | ICD-10-CM | POA: Insufficient documentation

## 2013-12-11 DIAGNOSIS — Z8669 Personal history of other diseases of the nervous system and sense organs: Secondary | ICD-10-CM | POA: Insufficient documentation

## 2013-12-11 NOTE — ED Notes (Signed)
Pt states that she has been having URI like symptoms for the past 3-4 days. Pt states that she has been having fever, chills, sore throat, dry cough. Pt states that she took Ibuprofen at 20:30 (pt does not currently have a fever.)

## 2013-12-12 ENCOUNTER — Emergency Department (HOSPITAL_COMMUNITY): Payer: Self-pay

## 2013-12-12 ENCOUNTER — Emergency Department (HOSPITAL_COMMUNITY)
Admission: EM | Admit: 2013-12-12 | Discharge: 2013-12-12 | Disposition: A | Discharge status: Home / Self Care | Payer: Self-pay | Attending: Emergency Medicine | Admitting: Emergency Medicine

## 2013-12-12 DIAGNOSIS — B349 Viral infection, unspecified: Secondary | ICD-10-CM

## 2013-12-12 MED ORDER — ACETAMINOPHEN 325 MG PO TABS
650.0000 mg | ORAL_TABLET | Freq: Once | ORAL | Status: AC
  Administered 2013-12-12: 650 mg via ORAL
  Filled 2013-12-12: qty 2

## 2013-12-12 MED ORDER — OSELTAMIVIR PHOSPHATE 75 MG PO CAPS: 75.0000 mg | ORAL_CAPSULE | Freq: Two times a day (BID) | ORAL | Status: DC

## 2013-12-12 MED ORDER — HYDROCOD POLST-CHLORPHEN POLST 10-8 MG/5ML PO LQCR
5.0000 mL | Freq: Two times a day (BID) | ORAL | Status: DC
  Administered 2013-12-12: 5 mL via ORAL
  Filled 2013-12-12: qty 5

## 2013-12-12 MED ORDER — AZITHROMYCIN 250 MG PO TABS: ORAL_TABLET | ORAL | Status: DC

## 2013-12-12 MED ORDER — IBUPROFEN 800 MG PO TABS
800.0000 mg | ORAL_TABLET | Freq: Once | ORAL | Status: AC
  Administered 2013-12-12: 800 mg via ORAL
  Filled 2013-12-12: qty 1

## 2013-12-12 MED ORDER — ALBUTEROL SULFATE HFA 108 (90 BASE) MCG/ACT IN AERS
2.0000 | INHALATION_SPRAY | RESPIRATORY_TRACT | Status: DC | PRN
  Administered 2013-12-12: 2 via RESPIRATORY_TRACT
  Filled 2013-12-12: qty 6.7

## 2013-12-12 NOTE — ED Provider Notes (Signed)
CSN: [id number]     Arrival date & time 12/11/13  2125 History   First MD Initiated Contact with Patient 12/12/13 0422     Chief Complaint  Patient presents with  . URI   (Consider location/radiation/quality/duration/timing/severity/associated sxs/prior Treatment) HPI HX per PT - fever, cough, cold and congestion. Onset of fever yesterday, has 29 mo old child with cough at home. Some sore throat. No rash. No recent travel. No CP. No SOB. Symptoms MOD in severity.   Past Medical History  Diagnosis Date  . ADVANCED MATERNAL AGE 70/02/2008  . DIABETES MELLITUS, GESTATIONAL, HX OF 05/18/2008  . 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 Contact dermatitis 08/27/2011  . 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 Migraine headache     last migraine in Sept 2012  . GERD (gastroesophageal reflux disease)     doesn't take anything for acid reflux  . Gastric ulcer     history of  . Hemorrhoids   . Constipation   . Diarrhea   . ANEMIA, IRON DEFICIENCY, UNSPEC. 02/12/2007    after IUD  . Diabetes mellitus without complication     gestational diab. currently & with infant #2  . Gestational diabetes A2/B 07/20/2012    Fetal echo-nml-under media tab, needs baseline labs-nml with nml TSH and 24 hour urine-68mg . To see optho in Nov 2013-Per pt. Ok, serial u/s for growth, 2x/wk testing at 32 wks.    Past Surgical History  Procedure Laterality Date  . Cesarean section  2005/2010  . Female circumcision  83  . Tonsillectomy  1989  . Dilation and curettage of uterus  2009  . Cholecystectomy  11/28/2011    Procedure: LAPAROSCOPIC CHOLECYSTECTOMY WITH INTRAOPERATIVE CHOLANGIOGRAM;  Surgeon: Wilmon Arms. Corliss Skains, MD;  Location: [facility] OR;  Service: General;  Laterality: N/A;  . Cesarean section N/A 01/22/2013    Procedure: CESAREAN SECTION;  Surgeon: Reva Bores, MD;  Location: [facility];  Service: Obstetrics;  Laterality: N/A;  Repeat   Family History  Problem  Relation Age of Onset  . Diabetes Mother   . Hypertension Mother   . Hyperlipidemia Mother   . Hypertension Father   . Hyperlipidemia Father   . Anesthesia problems Neg Hx   . Hypotension Neg Hx   . Malignant hyperthermia Neg Hx   . Pseudochol deficiency Neg Hx    History  Substance Use Topics  . Smoking status: Never Smoker   . Smokeless tobacco: Never Used  . Alcohol Use: No   OB History   Grav Para Term Preterm Abortions TAB SAB Ect Mult Living   5 4 4  1  1   3      Review of Systems  Constitutional: Positive for fever and chills.  HENT: Positive for congestion and sneezing. Negative for trouble swallowing and voice change.   Eyes: Negative for visual disturbance.  Respiratory: Positive for cough. Negative for shortness of breath.   Cardiovascular: Negative for chest pain.  Gastrointestinal: Negative for abdominal pain.  Genitourinary: Negative for dysuria.  Musculoskeletal: Negative for back pain and neck stiffness.  Skin: Negative for rash.  Neurological: Negative for headaches.  All other systems reviewed and are negative.    Allergies  Oxycodone  Home Medications   Current Outpatient Rx  Name  Route  Sig  Dispense  Refill  . acetaminophen (TYLENOL) 325 MG tablet   Oral   Take 650 mg by mouth every 6 (six) hours as needed for moderate pain.         Marland Kitchen  ibuprofen (ADVIL,MOTRIN) 800 MG tablet   Oral   Take 800 mg by mouth every 8 (eight) hours as needed for moderate pain.         . Prenatal Multivit-Min-Fe-FA (PRENATAL VITAMINS) 0.8 MG tablet   Oral   Take 1 tablet by mouth daily.   30 tablet   12   . SUMAtriptan (IMITREX) 50 MG tablet   Oral   Take 1 tablet (50 mg total) by mouth every 2 (two) hours as needed for migraine (max 2 tabs per day. Pump and dump after use.).   30 tablet   0    BP 125/77  Pulse 124  Temp(Src) 98.7 F (37.1 C) (Oral)  Resp 18  Ht 5\' 6"  (1.676 m)  Wt 167 lb (75.751 kg)  BMI 26.97 kg/m2  SpO2 99% Physical Exam   Constitutional: She is oriented to person, place, and time. She appears well-developed and well-nourished.  HENT:  Head: Normocephalic and atraumatic.  Mouth/Throat: Oropharynx is clear and moist. No oropharyngeal exudate.  Eyes: EOM are normal. Pupils are equal, round, and reactive to light.  Neck: Neck supple.  Cardiovascular: Normal rate, regular rhythm and intact distal pulses.   Pulmonary/Chest: Effort normal. No respiratory distress.  Coarse bilateral breath sounds  Musculoskeletal: Normal range of motion. She exhibits no edema.  Neurological: She is alert and oriented to person, place, and time.  Skin: Skin is warm and dry.    ED Course  Procedures (including critical care time) Labs Review Labs Reviewed - No data to display Imaging Review Dg Chest 2 View  12/12/2013   CLINICAL DATA:  Cough, congestion and fever.  EXAM: CHEST  2 VIEW  COMPARISON:  None.  FINDINGS: The lungs are mildly hypoexpanded. Minimal bibasilar opacities may reflect atelectasis or less likely mild pneumonia. There is no evidence of pleural effusion or pneumothorax.  The heart is normal in size; the mediastinal contour is within normal limits. No acute osseous abnormalities are seen. Clips are noted within the right upper quadrant, reflecting prior cholecystectomy.  IMPRESSION: Lungs mildly hypoexpanded. Minimal bibasilar airspace opacities may reflect atelectasis or less likely mild pneumonia.   Electronically Signed   By: Roanna Raider M.D.   On: 12/12/2013 06:08   Albuterol, Motrin, Tussionex provided  Recheck is feeling significantly better with lung sounds clear.  Plan discharge home with prescription for antibiotics and close outpatient followup.  MDM  Diagnosis: Viral infection Presenting with flulike symptoms - improved with medications. Chest x-ray reviewed as above and for possible pneumonia Z-Pak provided. Vital signs and nursing notes reviewed and considered.   Sunnie Nielsen, MD 12/13/13  [phone]

## 2013-12-13 ENCOUNTER — Ambulatory Visit (INDEPENDENT_AMBULATORY_CARE_PROVIDER_SITE_OTHER): Payer: Self-pay | Admitting: Family Medicine

## 2013-12-13 ENCOUNTER — Encounter: Payer: Self-pay | Admitting: Family Medicine

## 2013-12-13 VITALS — BP 123/81 | HR 93 | Temp 98.4°F | Ht 66.0 in | Wt 162.0 lb

## 2013-12-13 DIAGNOSIS — J111 Influenza due to unidentified influenza virus with other respiratory manifestations: Secondary | ICD-10-CM

## 2013-12-13 NOTE — Progress Notes (Signed)
   Subjective:    Patient ID: Susan Hopkins, female    DOB: 24-Aug-1971, 42 y.o.   MRN: [id number]  HPI  CC: ER follow up for flu symptoms  # Flu - started last Tuesday (6d ago) - fever/congestion/cough/runny nose - sore throat started Friday night, around same time muscle aches and fatigue - has not had a fever today or yesterday but still has chills - went to ED Saturday, diagnosed with flu and possible pneumonia, given z-pack. She was concerned about taking the z-pack because she is breast feeding.  Review of Systems Nasal congestion, no ear pain. No CP. Endorses SOB.     Objective:   Physical Exam BP 123/81  Pulse 93  Temp(Src) 98.4 F (36.9 C) (Other (Comment))  Ht 5\' 6"  (1.676 m)  Wt 162 lb (73.483 kg)  BMI 26.16 kg/m2  LMP 12/01/2013  General: sick appearing, face mask on HEENT: PERRL, EOMI. TMs pearly gray bilaterally. Oropharynx mildly erythematous without exudate. CV: RRR, normal heart sounds Resp: CTAB, effort normal. No crackles noted Ext: no edema or cyanosis      Assessment & Plan:  See Problem List documentation

## 2013-12-13 NOTE — Patient Instructions (Signed)
As told to you by the ER, you likely have the flu. The chest x-ray showed a possible pneumonia, but it is less likely. The azithromycin would be okay for you to take, if you do start it please take the full course.  The flu can sometimes take up to 2 weeks to feel better. If your symptoms get worse, your breathing becomes more difficult, please call the clinic or go back to the ED.

## 2013-12-13 NOTE — Assessment & Plan Note (Signed)
6 day history of flu like symptoms, outside of tamiflu treatment window. Continue symptomatic treatment, return precautions for pneumonia given. Discussed her CXR result with her, she would like to take the z-pack which is okay to do with breastfeeding.

## 2013-12-14 ENCOUNTER — Telehealth: Payer: Self-pay | Admitting: Family Medicine

## 2013-12-14 ENCOUNTER — Emergency Department (HOSPITAL_COMMUNITY)
Admission: EM | Admit: 2013-12-14 | Discharge: 2013-12-15 | Disposition: A | Discharge status: Home / Self Care | Payer: Self-pay | Attending: Emergency Medicine | Admitting: Emergency Medicine

## 2013-12-14 ENCOUNTER — Emergency Department (HOSPITAL_COMMUNITY): Payer: Self-pay

## 2013-12-14 ENCOUNTER — Encounter (HOSPITAL_COMMUNITY): Payer: Self-pay | Admitting: Emergency Medicine

## 2013-12-14 DIAGNOSIS — Z872 Personal history of diseases of the skin and subcutaneous tissue: Secondary | ICD-10-CM | POA: Insufficient documentation

## 2013-12-14 DIAGNOSIS — R112 Nausea with vomiting, unspecified: Secondary | ICD-10-CM

## 2013-12-14 DIAGNOSIS — Z8742 Personal history of other diseases of the female genital tract: Secondary | ICD-10-CM | POA: Insufficient documentation

## 2013-12-14 DIAGNOSIS — D72829 Elevated white blood cell count, unspecified: Secondary | ICD-10-CM | POA: Insufficient documentation

## 2013-12-14 DIAGNOSIS — G43909 Migraine, unspecified, not intractable, without status migrainosus: Secondary | ICD-10-CM | POA: Insufficient documentation

## 2013-12-14 DIAGNOSIS — Z79899 Other long term (current) drug therapy: Secondary | ICD-10-CM | POA: Insufficient documentation

## 2013-12-14 DIAGNOSIS — D509 Iron deficiency anemia, unspecified: Secondary | ICD-10-CM | POA: Insufficient documentation

## 2013-12-14 DIAGNOSIS — Z8719 Personal history of other diseases of the digestive system: Secondary | ICD-10-CM | POA: Insufficient documentation

## 2013-12-14 DIAGNOSIS — O24419 Gestational diabetes mellitus in pregnancy, unspecified control: Secondary | ICD-10-CM

## 2013-12-14 DIAGNOSIS — J111 Influenza due to unidentified influenza virus with other respiratory manifestations: Secondary | ICD-10-CM | POA: Insufficient documentation

## 2013-12-14 DIAGNOSIS — Z8632 Personal history of gestational diabetes: Secondary | ICD-10-CM | POA: Insufficient documentation

## 2013-12-14 LAB — CBC WITH DIFFERENTIAL/PLATELET
Basophils Absolute: 0 10*3/uL (ref 0.0–0.1)
Basophils Relative: 0 % (ref 0–1)
Eosinophils Absolute: 0.1 10*3/uL (ref 0.0–0.7)
Lymphs Abs: 2.6 10*3/uL (ref 0.7–4.0)
MCH: 24.8 pg — ABNORMAL LOW (ref 26.0–34.0)
MCV: 76.6 fL — ABNORMAL LOW (ref 78.0–100.0)
Monocytes Absolute: 0.7 10*3/uL (ref 0.1–1.0)
Neutro Abs: 7.6 10*3/uL (ref 1.7–7.7)
Platelets: 289 10*3/uL (ref 150–400)
RDW: 14.7 % (ref 11.5–15.5)
WBC: 11 10*3/uL — ABNORMAL HIGH (ref 4.0–10.5)

## 2013-12-14 LAB — COMPREHENSIVE METABOLIC PANEL
AST: 15 U/L (ref 0–37)
Albumin: 3.7 g/dL (ref 3.5–5.2)
Alkaline Phosphatase: 87 U/L (ref 39–117)
Calcium: 9.1 mg/dL (ref 8.4–10.5)
Creatinine, Ser: 0.42 mg/dL — ABNORMAL LOW (ref 0.50–1.10)
Glucose, Bld: 89 mg/dL (ref 70–99)
Potassium: 3.8 mEq/L (ref 3.7–5.3)

## 2013-12-14 MED ORDER — SODIUM CHLORIDE 0.9 % IV BOLUS (SEPSIS)
1000.0000 mL | Freq: Once | INTRAVENOUS | Status: AC
  Administered 2013-12-14: 1000 mL via INTRAVENOUS

## 2013-12-14 MED ORDER — ONDANSETRON HCL 4 MG/2ML IJ SOLN
4.0000 mg | Freq: Once | INTRAMUSCULAR | Status: AC
  Administered 2013-12-14: 4 mg via INTRAVENOUS
  Filled 2013-12-14: qty 2

## 2013-12-14 MED ORDER — DIPHENHYDRAMINE HCL 50 MG/ML IJ SOLN
25.0000 mg | Freq: Once | INTRAMUSCULAR | Status: AC
  Administered 2013-12-14: 25 mg via INTRAVENOUS
  Filled 2013-12-14: qty 1

## 2013-12-14 MED ORDER — ACETAMINOPHEN 500 MG PO TABS
1000.0000 mg | ORAL_TABLET | Freq: Once | ORAL | Status: AC
  Administered 2013-12-14: 1000 mg via ORAL
  Filled 2013-12-14: qty 2

## 2013-12-14 MED ORDER — ONDANSETRON 4 MG PO TBDP: 4.0000 mg | ORAL_TABLET | Freq: Three times a day (TID) | ORAL | Status: DC | PRN

## 2013-12-14 MED ORDER — METOCLOPRAMIDE HCL 5 MG/ML IJ SOLN
10.0000 mg | Freq: Once | INTRAMUSCULAR | Status: AC
  Administered 2013-12-14: 10 mg via INTRAVENOUS
  Filled 2013-12-14: qty 2

## 2013-12-14 NOTE — Telephone Encounter (Signed)
Spoke with patient.  States that she is throwing up and still does not feel good.  Advised a BRAT diet and to go to urgent care or ED if still not feeling better as we are full tomorrow. Fle[place], Maryjo Rochester

## 2013-12-14 NOTE — ED Notes (Signed)
Pt states she has had fever, sore throat, headache since Tues. Pt went to [facility] on Sat and dx'ed with flu. Pt is taking Tamiflu. Pt states now she has n/v since yesterday. Pt states she can't keep anything down. Pt denies diarrhea. Pt c/o headache. Pt with no acute distress. Pt alert, skin warm and dry.

## 2013-12-14 NOTE — Telephone Encounter (Signed)
Pt called and is still not feeling well she was seen 12/29 and on 12/28 in the ER. She was given medication but would like to talk to a nurse about what is going on. jw

## 2013-12-14 NOTE — ED Provider Notes (Signed)
CSN: [id number]     Arrival date & time 12/14/13  1701 History   First MD Initiated Contact with Patient 12/14/13 1954     Chief Complaint  Patient presents with  . Influenza  . Emesis    HPI  Patient presents with concern of nausea, vomiting, generalized discomfort. Patient has had discomfort for approximately one week.  She diagnosed with influenza clinically 40s ago, also started on antibiotics for x-ray suggestive of pneumonia. She notes that in general her URI like symptoms have improved, though she has worsening nausea, by mouth intolerance, including medication. Note no fever, no new confusion, syncope, chest pain, dyspnea. Patient is unable to take any medication for relief of symptoms.  Past Medical History  Diagnosis Date  . ADVANCED MATERNAL AGE 54/02/2008  . DIABETES MELLITUS, GESTATIONAL, HX OF 05/18/2008  . 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 Contact dermatitis 08/27/2011  . 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 Migraine headache     last migraine in Sept 2012  . GERD (gastroesophageal reflux disease)     doesn't take anything for acid reflux  . Gastric ulcer     history of  . Hemorrhoids   . Constipation   . Diarrhea   . ANEMIA, IRON DEFICIENCY, UNSPEC. 02/12/2007    after IUD  . Diabetes mellitus without complication     gestational diab. currently & with infant #2  . Gestational diabetes A2/B 07/20/2012    Fetal echo-nml-under media tab, needs baseline labs-nml with nml TSH and 24 hour urine-68mg . To see optho in Nov 2013-Per pt. Ok, serial u/s for growth, 2x/wk testing at 32 wks.    Past Surgical History  Procedure Laterality Date  . Cesarean section  2005/2010  . Female circumcision  8  . Tonsillectomy  1989  . Dilation and curettage of uterus  2009  . Cholecystectomy  11/28/2011    Procedure: LAPAROSCOPIC CHOLECYSTECTOMY WITH INTRAOPERATIVE CHOLANGIOGRAM;  Surgeon: Wilmon Arms. Corliss Skains, MD;  Location: [facility]  OR;  Service: General;  Laterality: N/A;  . Cesarean section N/A 01/22/2013    Procedure: CESAREAN SECTION;  Surgeon: Reva Bores, MD;  Location: [facility] ORS;  Service: Obstetrics;  Laterality: N/A;  Repeat   Family History  Problem Relation Age of Onset  . Diabetes Mother   . Hypertension Mother   . Hyperlipidemia Mother   . Hypertension Father   . Hyperlipidemia Father   . Anesthesia problems Neg Hx   . Hypotension Neg Hx   . Malignant hyperthermia Neg Hx   . Pseudochol deficiency Neg Hx    History  Substance Use Topics  . Smoking status: Never Smoker   . Smokeless tobacco: Never Used  . Alcohol Use: No   OB History   Grav Para Term Preterm Abortions TAB SAB Ect Mult Living   5 4 4  1  1   3      Review of Systems  Constitutional:       Per HPI, otherwise negative  HENT:       Per HPI, otherwise negative  Respiratory:       Per HPI, otherwise negative  Cardiovascular:       Per HPI, otherwise negative  Gastrointestinal: Positive for nausea, vomiting and diarrhea.  Endocrine:       Negative aside from HPI  Genitourinary:       Neg aside from HPI   Musculoskeletal:       Per HPI, otherwise negative  Skin: Negative.  Neurological: Negative for syncope.    Allergies  Oxycodone  Home Medications   Current Outpatient Rx  Name  Route  Sig  Dispense  Refill  . acetaminophen (TYLENOL) 325 MG tablet   Oral   Take 650 mg by mouth every 6 (six) hours as needed for moderate pain.         Marland Kitchen azithromycin (ZITHROMAX Z-PAK) 250 MG tablet      Use As Directed   6 tablet   0   . ibuprofen (ADVIL,MOTRIN) 800 MG tablet   Oral   Take 800 mg by mouth every 8 (eight) hours as needed for moderate pain.         Marland Kitchen oseltamivir (TAMIFLU) 75 MG capsule   Oral   Take 1 capsule (75 mg total) by mouth every 12 (twelve) hours.   10 capsule   0   . Prenatal Multivit-Min-Fe-FA (PRENATAL VITAMINS) 0.8 MG tablet   Oral   Take 1 tablet by mouth daily.   30 tablet   12   .  SUMAtriptan (IMITREX) 50 MG tablet   Oral   Take 1 tablet (50 mg total) by mouth every 2 (two) hours as needed for migraine (max 2 tabs per day. Pump and dump after use.).   30 tablet   0    BP 133/86  Pulse 100  Temp(Src) 99.2 F (37.3 C) (Oral)  Resp 16  SpO2 100%  LMP 12/01/2013 Physical Exam  Nursing note and vitals reviewed. Constitutional: She is oriented to person, place, and time. She appears well-developed and well-nourished. No distress.  HENT:  Head: Normocephalic and atraumatic.  Eyes: Conjunctivae and EOM are normal.  Cardiovascular: Normal rate and regular rhythm.   Pulmonary/Chest: Effort normal and breath sounds normal. No stridor. No respiratory distress.  Abdominal: She exhibits no distension. There is no tenderness. There is no rebound and no guarding.  Musculoskeletal: She exhibits no edema.  Neurological: She is alert and oriented to person, place, and time. No cranial nerve deficit.  Skin: Skin is warm and dry.  Psychiatric: She has a normal mood and affect.    ED Course  Procedures (including critical care time) Labs Review Labs Reviewed  CBC WITH DIFFERENTIAL  COMPREHENSIVE METABOLIC PANEL  LIPASE, BLOOD   Imaging Review Dg Chest 2 View  12/14/2013   CLINICAL DATA:  Fever, chills, cough, congestion.  EXAM: CHEST  2 VIEW  COMPARISON:  12/12/2013  FINDINGS: Peribronchial thickening compatible with bronchitis. Linear scarring or atelectasis at the right base. Left lung is clear. No effusions. Heart is normal size. No bony abnormality.  IMPRESSION: Bronchitic changes.  Right basilar scarring or atelectasis.   Electronically Signed   By: Charlett Nose M.D.   On: 12/14/2013 20:18    EKG Interpretation   None      10:39 PM Patient has improved, though she continues to have headache. I discussed all results with her and her husband. MDM  No diagnosis found. Patient presents with persistent discomfort following recent diagnoses of influenza, possible  pneumonia.  Patient's evaluation here tonight is notable for dentures with no significant opacification consistent with pneumonia.  Patient does have mild leukocytosis, consistent with ongoing infection.  Patient is afebrile, awake, alert, interactive and hemodynamically stable, reassuring for the low suspicion of sepsis. Patient presents initially here, was discharged in stable condition to follow up with primary care    Gerhard Munch, MD 12/14/13 2240

## 2013-12-27 ENCOUNTER — Telehealth: Payer: Self-pay | Admitting: *Deleted

## 2013-12-27 NOTE — Telephone Encounter (Signed)
LM for pt to call back and schedule a hospital follow up. Lori Liew,CMA

## 2014-02-14 ENCOUNTER — Ambulatory Visit: Payer: Self-pay

## 2014-03-10 ENCOUNTER — Ambulatory Visit: Payer: Self-pay

## 2014-03-25 ENCOUNTER — Ambulatory Visit: Payer: Self-pay

## 2014-05-12 ENCOUNTER — Ambulatory Visit (INDEPENDENT_AMBULATORY_CARE_PROVIDER_SITE_OTHER): Payer: 59 | Admitting: Family Medicine

## 2014-05-12 ENCOUNTER — Encounter: Payer: Self-pay | Admitting: Family Medicine

## 2014-05-12 VITALS — BP 137/88 | HR 82 | Temp 98.3°F | Wt 165.0 lb

## 2014-05-12 DIAGNOSIS — R5383 Other fatigue: Principal | ICD-10-CM

## 2014-05-12 DIAGNOSIS — R928 Other abnormal and inconclusive findings on diagnostic imaging of breast: Secondary | ICD-10-CM

## 2014-05-12 DIAGNOSIS — R5381 Other malaise: Secondary | ICD-10-CM

## 2014-05-12 DIAGNOSIS — G43909 Migraine, unspecified, not intractable, without status migrainosus: Secondary | ICD-10-CM

## 2014-05-12 DIAGNOSIS — O99810 Abnormal glucose complicating pregnancy: Secondary | ICD-10-CM

## 2014-05-12 DIAGNOSIS — O24419 Gestational diabetes mellitus in pregnancy, unspecified control: Secondary | ICD-10-CM

## 2014-05-12 LAB — COMPREHENSIVE METABOLIC PANEL
ALK PHOS: 62 U/L (ref 39–117)
ALT: 10 U/L (ref 0–35)
AST: 12 U/L (ref 0–37)
Albumin: 4.6 g/dL (ref 3.5–5.2)
BILIRUBIN TOTAL: 0.2 mg/dL (ref 0.2–1.2)
BUN: 7 mg/dL (ref 6–23)
CO2: 28 mEq/L (ref 19–32)
Calcium: 9 mg/dL (ref 8.4–10.5)
Chloride: 102 mEq/L (ref 96–112)
Creat: 0.4 mg/dL — ABNORMAL LOW (ref 0.50–1.10)
Glucose, Bld: 101 mg/dL — ABNORMAL HIGH (ref 70–99)
Potassium: 3.7 mEq/L (ref 3.5–5.3)
SODIUM: 136 meq/L (ref 135–145)
Total Protein: 7.3 g/dL (ref 6.0–8.3)

## 2014-05-12 LAB — CBC
HCT: 38.9 % (ref 36.0–46.0)
HEMOGLOBIN: 12.6 g/dL (ref 12.0–15.0)
MCH: 24.4 pg — AB (ref 26.0–34.0)
MCHC: 32.4 g/dL (ref 30.0–36.0)
MCV: 75.2 fL — ABNORMAL LOW (ref 78.0–100.0)
Platelets: 300 10*3/uL (ref 150–400)
RBC: 5.17 MIL/uL — ABNORMAL HIGH (ref 3.87–5.11)
RDW: 14.7 % (ref 11.5–15.5)
WBC: 5.8 10*3/uL (ref 4.0–10.5)

## 2014-05-12 LAB — POCT GLYCOSYLATED HEMOGLOBIN (HGB A1C): HEMOGLOBIN A1C: 5.8

## 2014-05-12 LAB — TSH: TSH: 1.425 u[IU]/mL (ref 0.350–4.500)

## 2014-05-12 MED ORDER — IBUPROFEN 800 MG PO TABS: 800.0000 mg | ORAL_TABLET | Freq: Three times a day (TID) | ORAL | Status: DC | PRN

## 2014-05-12 MED ORDER — PRENATAL VITAMINS 0.8 MG PO TABS: 1.0000 | ORAL_TABLET | Freq: Every day | ORAL | Status: AC

## 2014-05-12 MED ORDER — SUMATRIPTAN SUCCINATE 50 MG PO TABS: 50.0000 mg | ORAL_TABLET | ORAL | Status: AC | PRN

## 2014-05-12 NOTE — Progress Notes (Signed)
Tana Conch, MD Phone: [phone]  Subjective:   Susan Hopkins is a 43 y.o. year old very pleasant female patient who presents with the following:  Fatigue Migraines Patient states she has had worsening fatigue over last 3 months. She is overwhelmed by all she needs to achieve as a mother of 4 including a 32 month old that she still breastfeeds while her husband works 7 days a week. On further questioning, patient is getting up 3-4 x a night to breastfeed and has been doing so for month. When asked about this, patient becomes tearful due to level of stress. She states she does not have may other issues. She does have more frequent headaches due to her fatigue and has run out of ibuprofen and imitrex that she used to use with pump and dump method. She requests refills on these.   Denies feelings of depression. PHQ 2 of 1 simply because of fatigue. phq9 of 4 simply because of fatigue being 3/3/   ROS- does have mild polyuria reportedly but no increased thirs. No snoring. Does have some thinning of hair. No shortness of breath or chest pain  Axillary "knot" History of birads 3 mammogram in 2012 Small lump in axilla that she has noted for osmet ime and was ultrasounded in pregnancy. No recent enlargement. She remains concerned about area and would liek follow up ROS- no unintentional weight changes, fever/chills, night sweats.    Past Medical History- Patient Active Problem List   Diagnosis Date Noted  . Elevated liver enzymes 08/26/2012    Priority: Medium  . Mammographic breast lesion 07/30/2012    Priority: Medium  . Gestational diabetes A2/B 07/20/2012    Priority: Medium  . MIGRAINE HEADACHE 01/09/2011    Priority: Medium  . ANEMIA, IRON DEFICIENCY, UNSPEC. 02/12/2007    Priority: Medium  . Overweight (BMI 25.0-29.9) 10/15/2011    Priority: Low  . GASTROESOPHAGEAL REFLUX, NO ESOPHAGITIS 02/12/2007    Priority: Low  . Other malaise and fatigue 05/12/2014   Medications-  reviewed and updated Current Outpatient Prescriptions  Medication Sig Dispense Refill  . acetaminophen (TYLENOL) 325 MG tablet Take 650 mg by mouth every 6 (six) hours as needed for moderate pain.      Marland Kitchen ibuprofen (ADVIL,MOTRIN) 800 MG tablet Take 1 tablet (800 mg total) by mouth every 8 (eight) hours as needed for moderate pain.  30 tablet  1  . ondansetron (ZOFRAN ODT) 4 MG disintegrating tablet Take 1 tablet (4 mg total) by mouth every 8 (eight) hours as needed for nausea or vomiting.  20 tablet  0  . Prenatal Multivit-Min-Fe-FA (PRENATAL VITAMINS) 0.8 MG tablet Take 1 tablet by mouth daily.  30 tablet  12  . SUMAtriptan (IMITREX) 50 MG tablet Take 1 tablet (50 mg total) by mouth every 2 (two) hours as needed for migraine (max 2 tabs per day. Pump and dump after use.).  30 tablet  0   No current facility-administered medications for this visit.    Objective: BP 137/88  Pulse 82  Temp(Src) 98.3 F (36.8 C) (Oral)  Wt 165 lb (74.844 kg)  LMP 04/29/2014  Breastfeeding? No Gen: NAD, resting comfortably on table, becomes tearful when discussing stress level Breasts: normal appearance, no masses or tenderness of right breast, there is some slightly enlarged area in right axilla that I honestly would not have noted without patient assistance.  Ext: no edema Skin: warm, dry, no rash  Assessment/Plan:  Fatigue I suspect this is related to poor sleep  hygiene and still waking up 3-4x a night to breastfeed. Offered to start with habit change and follow up but patient is pursuing bloodwork. Does have history of anemia so check CBC. Some hair thinning so check TSH. Also check a1c given history of gestational diabetes (a1c 5.8 today) as well as CMET to assess kidney/liver function. I suspect workup will be largely unremarkable except for anemia. Have suggested habit changes (patient seemed to have great relief to know she could let child sleep through night). Also need to discuss upreg at follow up.     MIGRAINE HEADACHE Refilled sumatriptan and ibuprofen due to more persistent headaches. Advised of need to pump and dump.   Mammographic breast lesion Small axillary tissue enlargement Area in right axilla of small tissue enlargement. Will send for ultrasound. WHen she stops breastfeeding, we can pursue mammogram within next year given history of birads 3 in 2012.     Orders Placed This Encounter  Procedures  . US BREAST COMPLETE UNI RIGHT INC AXILLA    Standing Status: Future     Number of Occurrences:      Standing Expiration Date: 07/13/2015    Scheduling Instructions:     May change order as needed. Also, may add mammogram but previous suggestion was to wait until after breastfeeding stopped    Order Specific Question:  Reason for Exam (SYMPTOM  OR DIAGNOSIS REQUIRED)    Answer:  axillary knot    Order Specific Question:  [facility]?    [facility]  . CBC  . TSH  . Comprehensive metabolic panel  . POCT glycosylated hemoglobin (Hb A1C)    Meds ordered this encounter  Medications  . ibuprofen (ADVIL,MOTRIN) 800 MG tablet    Sig: Take 1 tablet (800 mg total) by mouth every 8 (eight) hours as needed for moderate pain.    Dispense:  30 tablet    Refill:  1  . Prenatal Multivit-Min-Fe-FA (PRENATAL VITAMINS) 0.8 MG tablet    Sig: Take 1 tablet by mouth daily.    Dispense:  30 tablet    Refill:  12  . SUMAtriptan (IMITREX) 50 MG tablet    Sig: Take 1 tablet (50 mg total) by mouth every 2 (two) hours as needed for migraine (max 2 tabs per day. Pump and dump after use.).    Dispense:  30 tablet    Refill:  0

## 2014-05-12 NOTE — Assessment & Plan Note (Signed)
Area in right axilla of small tissue enlargement. Will send for ultrasound. WHen she stops breastfeeding, we can pursue mammogram within next year given history of birads 3 in 2012.

## 2014-05-12 NOTE — Assessment & Plan Note (Addendum)
I suspect this is related to poor sleep hygiene and still waking up 3-4x a night to breastfeed. Offered to start with habit change and follow up but patient is pursuing bloodwork. Does have history of anemia so check CBC. Some hair thinning so check TSH. Also check a1c given history of gestational diabetes (a1c 5.8 today) as well as CMET to assess kidney/liver function. I suspect workup will be largely unremarkable except for anemia. Have suggested habit changes (patient seemed to have great relief to know she could let child sleep through night). Also need to discuss upreg at follow up.

## 2014-05-12 NOTE — Patient Instructions (Signed)
Fatigue  I think this is related to little sleep as you are waking up to feed  I would encourage you to no longer feed at night or at least drastically cut down For your labs, I will send you a letter if there are no medication changes needed. I will call you if we need to discuss your lab results.  Orders Placed This Encounter  Procedures  . CBC  . TSH  . Comprehensive metabolic panel  . POCT glycosylated hemoglobin (Hb A1C)   Knot under right arm  We will order an ultrasound  I think this will likely be normal  You can call any optometry office for eye exam.   See me back in 2-3 weeks to check in, Dr. Durene Cal

## 2014-05-12 NOTE — Assessment & Plan Note (Signed)
Refilled sumatriptan and ibuprofen due to more persistent headaches. Advised of need to pump and dump.

## 2014-05-16 ENCOUNTER — Other Ambulatory Visit: Payer: Self-pay | Admitting: Family Medicine

## 2014-05-16 DIAGNOSIS — R928 Other abnormal and inconclusive findings on diagnostic imaging of breast: Secondary | ICD-10-CM

## 2014-05-19 ENCOUNTER — Ambulatory Visit
Admission: RE | Admit: 2014-05-19 | Discharge: 2014-05-19 | Disposition: A | Discharge status: Home / Self Care | Payer: 59 | Source: Ambulatory Visit | Attending: Family Medicine | Admitting: Family Medicine

## 2014-05-19 DIAGNOSIS — R928 Other abnormal and inconclusive findings on diagnostic imaging of breast: Secondary | ICD-10-CM

## 2014-06-02 ENCOUNTER — Ambulatory Visit: Payer: Self-pay | Admitting: Family Medicine

## 2014-09-21 ENCOUNTER — Ambulatory Visit (INDEPENDENT_AMBULATORY_CARE_PROVIDER_SITE_OTHER): Payer: 59 | Admitting: Family Medicine

## 2014-09-21 ENCOUNTER — Encounter: Payer: Self-pay | Admitting: Family Medicine

## 2014-09-21 VITALS — BP 120/78 | HR 79 | Temp 98.2°F | Ht 66.0 in | Wt 170.0 lb

## 2014-09-21 DIAGNOSIS — H109 Unspecified conjunctivitis: Secondary | ICD-10-CM | POA: Insufficient documentation

## 2014-09-21 MED ORDER — POLYMYXIN B-TRIMETHOPRIM 10000-0.1 UNIT/ML-% OP SOLN: 1.0000 [drp] | OPHTHALMIC | Status: DC

## 2014-09-21 NOTE — Patient Instructions (Addendum)
Dear Susan Hopkins, Thank you for coming in to clinic today.  Today we discussed your Eye Irritation. 1. It looks like you have Conjunctivitis of both eyes. This can be caused most likely by a Virus, however since your symptoms have been here for 10 days and not improving, we should treat for a possible bacteria infection. 2. Sent Polytrim eye drops - use 1 drop in each eye every 4 hours for up to 7 days (if symptoms completely resolved by 5 days, you may stop). 3. Continue warm compresses (tap water, no salt water) 2 to 3 times daily 4. May take Tylenol for any discomfort 5. Recommend starting OTC Anti-histamine (Claritin/Loratadine, or Zyrtec/Ceritirizine) take one of these tablets daily for allergies, try for 1 month to see if any relief.  Please schedule a follow-up appointment with Dr. Jimmey RalphParker in 1-2 weeks if symptoms do not improve or sooner if they get worse (loss of vision, worsening redness, eye pain, or redness this is an emergency).  Recommend flu shot when you are feeling better.  If you have any other questions or concerns, please feel free to call the clinic to contact me. You may also schedule an earlier appointment if necessary.  However, if your symptoms get significantly worse, please go to the Emergency Department to seek immediate medical attention.  Saralyn PilarAlexander Juno Bozard, DO [facility]   Conjunctivitis Conjunctivitis is commonly called "pink eye." Conjunctivitis can be caused by bacterial or viral infection, allergies, or injuries. There is usually redness of the lining of the eye, itching, discomfort, and sometimes discharge. There may be deposits of matter along the eyelids. A viral infection usually causes a watery discharge, while a bacterial infection causes a yellowish, thick discharge. Pink eye is very contagious and spreads by direct contact. You may be given antibiotic eyedrops as part of your treatment. Before using your eye medicine, remove all  drainage from the eye by washing gently with warm water and cotton balls. Continue to use the medication until you have awakened 2 mornings in a row without discharge from the eye. Do not rub your eye. This increases the irritation and helps spread infection. Use separate towels from other household members. Wash your hands with soap and water before and after touching your eyes. Use cold compresses to reduce pain and sunglasses to relieve irritation from light. Do not wear contact lenses or wear eye makeup until the infection is gone. SEEK MEDICAL CARE IF:   Your symptoms are not better after 3 days of treatment.  You have increased pain or trouble seeing.  The outer eyelids become very red or swollen. Document Released: 01/09/2005 Document Revised: 02/24/2012 Document Reviewed: 12/02/2005 [facility] Patient Information 2015 [employer] [place]. This information is not intended to replace advice given to you by your health care provider. Make sure you discuss any questions you have with your health care provider.

## 2014-09-21 NOTE — Assessment & Plan Note (Signed)
History consistent with bilateral conjunctivitis, unclear etiology most likely initially viral infection, now with worsening symptoms and new thick discharge x 3 days, symptoms x 10 days, suspect bilateral bacterial conjunctivitis. Possible allergic with itching and watering as well, however no relief to anti-histamine eye drops and oral anti-histamine - No red flag eye symptoms, bilateral symptoms, symmetrical, overall well-appearing, no evidence of eyelid/periorbital edema or erythema, afebrile  Plan: 1. Empiric treatment for bacterial conjunctivitis with Polytrim eye drops both eyes 1 drop q 4 hr x 5-7 days 2. Continue warm compresses (no salt water) 2-3x daily 3. Continue anti-histamine eye drops PRN, may take Tylenol PRN 4. Recommend starting daily anti-histamine for concern of allergies, will pick-up OTC Claritin or Zyrtec, 1 x daily for 1 month trial 5. RTC 1-2 weeks if no improvement, return precautions if worsening, emergency red flags given

## 2014-09-21 NOTE — Progress Notes (Signed)
   Subjective:    Patient ID: Susan Hopkins, female    DOB: 1971-09-13, 43 y.o.   MRN: [id number]  Patient presents for a same day appointment.  HPI  EYE WATERING / DISCHARGE: - Reports history of symptoms with "tearing, itching, and irritation", for past 10 days, gradual onset during day without inciting event, also reports some thicker greenish-white discharge from both eyes for past 3 days, worse in morning states wakes up with "dried, crusted" sometimes with discharge later in evening. Admits to rubbing eyelids due to itching and states that they feel "rough" - Currently using warm / cold compresses on eyes without significant relief  - No prior history of allergies, tried Zyrtec (3-4 days) and Vizene eye drops without significant relief - States has 2920 month old baby at home with cold (for past 1-2 days), and one son with allergies but no cold symptoms - Admits occasional eye discomfort, headaches - Denies any fevers/chills, loss of vision, vision changes, local redness or swelling, sinus pressure or congestion, sore throat  I have reviewed and updated the following as appropriate: allergies and current medications  Social Hx: - Never smoker  Review of Systems  See above HPI    Objective:   Physical Exam  BP 120/78  Pulse 79  Temp(Src) 98.2 F (36.8 C) (Oral)  Ht 5\' 6"  (1.676 m)  Wt 170 lb (77.111 kg)  BMI 27.45 kg/m2  Gen - well-appearing, pleasant, NAD HEENT - NCAT, PERRL, EOMI, patent nares w/o congestion, oropharynx with some slight erythema without asymmetry, edema, or exduates, MMM Eyes - PERRL, EOMI w/o eye pain, sclera with very mild bilateral injection without significant erythema, some tearing, no discharge or dried discharge, vision grossly intact, bilateral eyelid exam without concerns, no foreign bodies. Neck - supple, non-tender, no LAD Skin - warm, dry, no rashes     Assessment & Plan:   See specific A&P problem list for details.

## 2014-09-23 ENCOUNTER — Telehealth: Payer: Self-pay | Admitting: Family Medicine

## 2014-09-23 DIAGNOSIS — H109 Unspecified conjunctivitis: Secondary | ICD-10-CM

## 2014-09-23 MED ORDER — POLYMYXIN B-TRIMETHOPRIM 10000-0.1 UNIT/ML-% OP SOLN: 1.0000 [drp] | OPHTHALMIC | Status: DC

## 2014-09-23 NOTE — Telephone Encounter (Signed)
Patient missplaced her trimethoprim-polymyxin b rx somewhere at home.  Still having infection in eye.  Need a replacement rx or something different sent to pharmacy.

## 2014-09-23 NOTE — Telephone Encounter (Signed)
Will forward to MD to review.  Jazmin Hartsell,CMA  

## 2014-09-23 NOTE — Telephone Encounter (Signed)
Re-ordered her trimethoprim-polymyxin b.  Susan Hopkins [name], MD [facility] Resident PGY-1 09/23/2014 3:01 PM

## 2014-10-17 ENCOUNTER — Encounter: Payer: Self-pay | Admitting: Family Medicine

## 2014-12-27 ENCOUNTER — Encounter: Payer: Self-pay | Admitting: Family Medicine

## 2014-12-27 ENCOUNTER — Ambulatory Visit (INDEPENDENT_AMBULATORY_CARE_PROVIDER_SITE_OTHER): Payer: 59 | Admitting: Family Medicine

## 2014-12-27 ENCOUNTER — Encounter: Payer: Self-pay | Admitting: *Deleted

## 2014-12-27 ENCOUNTER — Ambulatory Visit (INDEPENDENT_AMBULATORY_CARE_PROVIDER_SITE_OTHER): Payer: 59 | Admitting: *Deleted

## 2014-12-27 VITALS — BP 145/94 | HR 97 | Temp 98.3°F | Wt 174.0 lb

## 2014-12-27 DIAGNOSIS — Z23 Encounter for immunization: Secondary | ICD-10-CM

## 2014-12-27 DIAGNOSIS — J069 Acute upper respiratory infection, unspecified: Secondary | ICD-10-CM

## 2014-12-27 MED ORDER — OLOPATADINE HCL 0.2 % OP SOLN: OPHTHALMIC | Status: DC

## 2014-12-27 NOTE — Progress Notes (Signed)
Prior Authorization received from [facility] for Pataday 0.2%.  PA form placed in provider box for completion. Clovis PuMartin, Genasis Zingale L, RN

## 2014-12-27 NOTE — Assessment & Plan Note (Addendum)
Likely viral in nature, especially given sick contacts at home. Advised symptomatic care.   Treating itchy/watery eyes with Pataday.

## 2014-12-27 NOTE — Patient Instructions (Signed)
It was nice to see you today.  You have a viral URI and you're getting better.   Continue supportive care with lots of fluids and rest. I am prescribing something for your eye redness/irritation (this is likely viral as well).  Follow up if you fail to improve or worsen.

## 2014-12-27 NOTE — Progress Notes (Signed)
   Subjective:    Patient ID: Susan CecilNuha Hopkins, female    DOB: 19-Aug-1971, 44 y.o.   MRN: [id number]  HPI 44 year old female presents for same day appointment with complaints of sore throat and itchy/watery eyes.  1) Sore Throat & Itchy watery eyes  She reports the above symptoms for approximate 4-5 days.  She notes associated fatigue.  Regarding her itchy/watery eyes, she does note mild crusting in the morning. No reported eye drainage. No difficulties with vision.  She reports positive sick contacts as several of her children are ill.  She denies any fevers or chills. No reported nausea, vomiting, diarrhea.  Patient reports that her symptoms are starting to slowly improve.  Review of Systems Per HPI    Objective:   Physical Exam Filed Vitals:   12/27/14 0930  BP: 145/94  Pulse: 97  Temp: 98.3 F (36.8 C)   Exam: General: well appearing, NAD. HEENT: NCAT. Normal TMs bilaterally. Oropharynx - status post tonsillectomy; several ulcerative/white areas noted.  Mild erythema noted. Eyes - injection noted. No discharge. No crusting. Cardiovascular: RRR. No murmurs, rubs, or gallops. Respiratory: CTAB. No rales, rhonchi, or wheeze. Abdomen: soft, nontender, nondistended.    Assessment & Plan:  See Problem List

## 2014-12-28 ENCOUNTER — Other Ambulatory Visit: Payer: Self-pay | Admitting: Family Medicine

## 2014-12-28 MED ORDER — AZELASTINE HCL 0.05 % OP SOLN: 1.0000 [drp] | Freq: Two times a day (BID) | OPHTHALMIC | Status: DC

## 2014-12-28 NOTE — Progress Notes (Signed)
Azelastine sent in.

## 2015-01-12 ENCOUNTER — Encounter: Payer: Self-pay | Admitting: Family Medicine

## 2015-01-12 ENCOUNTER — Ambulatory Visit (INDEPENDENT_AMBULATORY_CARE_PROVIDER_SITE_OTHER): Payer: 59 | Admitting: Family Medicine

## 2015-01-12 VITALS — BP 128/77 | HR 94 | Temp 97.8°F | Ht 66.0 in | Wt 176.2 lb

## 2015-01-12 DIAGNOSIS — T8089XA Other complications following infusion, transfusion and therapeutic injection, initial encounter: Principal | ICD-10-CM

## 2015-01-12 DIAGNOSIS — T888XXA Other specified complications of surgical and medical care, not elsewhere classified, initial encounter: Secondary | ICD-10-CM

## 2015-01-12 DIAGNOSIS — R52 Pain, unspecified: Secondary | ICD-10-CM | POA: Insufficient documentation

## 2015-01-12 NOTE — Assessment & Plan Note (Signed)
Resolved; from IM triptan given for migraine tx in right central buttock (not lateral nor superior). No symptoms of sciatic damage.

## 2015-01-12 NOTE — Patient Instructions (Signed)
I'm glad your pain is going away, I expect this to continue. Please return if it does not completely abate or if you develop any trouble with your legs.

## 2015-01-12 NOTE — Progress Notes (Signed)
Subjective: Azucena Ceciluha South is a 44 y.o. female presenting for pain at injection site.   [place] and subsequently had aching pain at the site which is constant, improving, "aching," non-radiating, and now mild. No therapy tried. Denies shooting pains, abnormal gait, deficits in sensation or movement.   - Review of Systems: Per HPI.  Objective: BP 128/77 mmHg  Pulse 94  Temp(Src) 97.8 F (36.6 C) (Oral)  Ht 5\' 6"  (1.676 m)  Wt 176 lb 3.2 oz (79.924 kg)  BMI 28.45 kg/m2 Gen:  44 y.o. female in no distress Right lower extremity: Very mildly tender 1cm x 1cm deep nodular area on palpation over the right buttock without overlying erythema, induration or fluctuance.   Assessment/Plan: Azucena Ceciluha Safer is a 44 y.o. female here for pain at injection site.

## 2015-02-13 ENCOUNTER — Encounter: Payer: Self-pay | Admitting: Family Medicine

## 2015-02-13 ENCOUNTER — Ambulatory Visit (INDEPENDENT_AMBULATORY_CARE_PROVIDER_SITE_OTHER): Payer: 59 | Admitting: Family Medicine

## 2015-02-13 VITALS — BP 119/81 | HR 78 | Temp 98.4°F | Wt 179.0 lb

## 2015-02-13 DIAGNOSIS — G43101 Migraine with aura, not intractable, with status migrainosus: Secondary | ICD-10-CM

## 2015-02-13 MED ORDER — KETOROLAC TROMETHAMINE 60 MG/2ML IM SOLN
60.0000 mg | Freq: Once | INTRAMUSCULAR | Status: AC
  Administered 2015-02-13: 60 mg via INTRAMUSCULAR

## 2015-02-13 NOTE — Patient Instructions (Signed)
Thank you for coming to the clinic today. It was nice seeing you.  For your migraines, we will give you a shot of toradol today. This medication is similar to ibuprofen. It is important that you not take anymore ibuprofen for the rest of the day.   We will also be referring you to the headache clinic. They will be calling you to set up an appointment.   Please come within the next couple months for a regular check up.

## 2015-02-13 NOTE — Assessment & Plan Note (Signed)
No neurologic deficits. Unable to give full migraine cocktail today as patient drove herself and is going to work this afternoon. Will give 60mg  IM toradol today. Instructed patient to not take ibuprofen for the rest of the day. Will refer to headache clinic for potential initiation of prophylactic medications.

## 2015-02-13 NOTE — Progress Notes (Signed)
Susan Hopkins is a 44 y.o. female [facility] with a chief complaint of headace.  HPI:  Headache Patient with constant headache for the past 10 days. Described as right sided and constant. Patient also has a history of migraines and states that she has had 2 migraines on top of her baseline headache within the past 10 days. Pain is currently rated as 6/10, but can increase to 10/10 with headaches. Patient has been taking ibuprofen nearly daily for the past 10 days, which reduces her pain to a 1 or 0, but only last for a few hours. Patient has tried taking imitrex for her headaches but states that it tends to make the pain worse. Currently denies any weakness or numbness. Has some right sided scotomas and photophobia associated with the migraines, otherwise no visual deficits.   Has had 4 total migraines within the past 2 months.   Has not taken any medications today.    ROS: As per HPI  Past Medical History - Reviewed and updated Patient Active Problem List   Diagnosis Date Noted  . Pain at injection site 01/12/2015  . URI (upper respiratory infection) 12/27/2014  . Elevated liver enzymes 08/26/2012  . Mammographic breast lesion 07/30/2012  . Gestational diabetes A2/B 07/20/2012  . Overweight (BMI 25.0-29.9) 10/15/2011  . MIGRAINE HEADACHE 01/09/2011  . ANEMIA, IRON DEFICIENCY, UNSPEC. 02/12/2007  . GASTROESOPHAGEAL REFLUX, NO ESOPHAGITIS 02/12/2007    Medications- reviewed and updated Current Outpatient Prescriptions  Medication Sig Dispense Refill  . acetaminophen (TYLENOL) 325 MG tablet Take 650 mg by mouth every 6 (six) hours as needed for moderate pain.    Marland Kitchen. azelastine (OPTIVAR) 0.05 % ophthalmic solution Place 1 drop into both eyes 2 (two) times daily. 6 mL 0  . ibuprofen (ADVIL,MOTRIN) 800 MG tablet Take 1 tablet (800 mg total) by mouth every 8 (eight) hours as needed for moderate pain. 30 tablet 1  . ondansetron (ZOFRAN ODT) 4  MG disintegrating tablet Take 1 tablet (4 mg total) by mouth every 8 (eight) hours as needed for nausea or vomiting. 20 tablet 0  . Prenatal Multivit-Min-Fe-FA (PRENATAL VITAMINS) 0.8 MG tablet Take 1 tablet by mouth daily. 30 tablet 12  . SUMAtriptan (IMITREX) 50 MG tablet Take 1 tablet (50 mg total) by mouth every 2 (two) hours as needed for migraine (max 2 tabs per day. Pump and dump after use.). 30 tablet 0  . trimethoprim-polymyxin b (POLYTRIM) ophthalmic solution Place 1 drop into both eyes every 4 (four) hours. For 7 days. 10 mL 0   No current facility-administered medications for this visit.    Objective: Physical Exam: BP 119/81 mmHg  Pulse 78  Temp(Src) 98.4 F (36.9 C) (Oral)  Wt 179 lb (81.194 kg)  Gen: NAD, resting comfortably on exam table.  HEENT: PERRL, EOMI CV: RRR with no murmurs appreciated Lungs: NWOB, CTAB with no crackles, wheezes, or rhonchi Ext: no edema Skin: warm, dry Neuro: CN2-12 intact. Motor strength 5/5 in upper and lower extremities. Sensation to light touch intact.   No results found for this or any previous visit (from the past 72 hour(s)).  A/P: See problem list  MIGRAINE HEADACHE No neurologic deficits. Unable to give full migraine cocktail today as patient drove herself and is going to work this afternoon. Will give 60mg  IM toradol today. Instructed patient to not take ibuprofen for the rest of the day. Will refer to headache clinic for potential initiation of prophylactic  medications.     Orders Placed This Encounter  Procedures  . AMB referral to headache clinic    Referral Priority:  Routine    Referral Type:  Consultation    Number of Visits Requested:  1    Meds ordered this encounter  Medications  . ketorolac (TORADOL) injection 60 mg    Sig:      Caleb M. Jimmey Ralph, MD [facility] Resident PGY-1 02/13/2015 12:12 PM

## 2015-03-06 ENCOUNTER — Telehealth: Payer: Self-pay | Admitting: Family Medicine

## 2015-03-06 NOTE — Telephone Encounter (Signed)
Needs referral to see pt / Dorothey BasemanSadie Reynolds, ASA

## 2015-03-06 NOTE — Telephone Encounter (Signed)
Referral placed online and authorization number put in referral.  Printed and faxed to [facility]. Jazmin Hartsell,CMA

## 2015-03-06 NOTE — Telephone Encounter (Signed)
Looked into patient's referral and she needs authorization for her upcoming appt with neuro.  Will [facility] and [facility]. Jazmin Hartsell,CMA

## 2015-03-06 NOTE — Telephone Encounter (Signed)
Tried to call [facility] back but was unable to reach anyone.  Will try again later.  Need to know the reason for her needing physical therapy. Park Beck,CMA

## 2015-03-09 ENCOUNTER — Ambulatory Visit (INDEPENDENT_AMBULATORY_CARE_PROVIDER_SITE_OTHER): Payer: 59 | Admitting: Neurology

## 2015-03-09 ENCOUNTER — Encounter: Payer: Self-pay | Admitting: Neurology

## 2015-03-09 VITALS — BP 120/72 | HR 84 | Temp 98.0°F | Ht 66.0 in | Wt 176.5 lb

## 2015-03-09 DIAGNOSIS — R519 Headache, unspecified: Secondary | ICD-10-CM

## 2015-03-09 DIAGNOSIS — R51 Headache: Secondary | ICD-10-CM

## 2015-03-09 DIAGNOSIS — I498 Other specified cardiac arrhythmias: Secondary | ICD-10-CM

## 2015-03-09 DIAGNOSIS — H539 Unspecified visual disturbance: Secondary | ICD-10-CM | POA: Insufficient documentation

## 2015-03-09 DIAGNOSIS — G43101 Migraine with aura, not intractable, with status migrainosus: Secondary | ICD-10-CM

## 2015-03-09 MED ORDER — ELETRIPTAN HYDROBROMIDE 40 MG PO TABS: 40.0000 mg | ORAL_TABLET | ORAL | Status: DC | PRN

## 2015-03-09 MED ORDER — AMITRIPTYLINE HCL 25 MG PO TABS: 25.0000 mg | ORAL_TABLET | Freq: Every day | ORAL | Status: DC

## 2015-03-09 NOTE — Progress Notes (Signed)
[facility]    Provider:  Dr Lucia Gaskins Referring Provider: Ardith Dark, MD Primary Care Physician:  Susan Doe, MD  CC:  Migraine  HPI:  Susan Hopkins is a 44 y.o. female here as a referral from Dr. Jimmey Ralph for Migraine  Migraines started 7 years ago. Used to have it 1-2x a year. Was worse with pregnancy. This last month she has pain on the right side of the head continuously. Throbbing. endorses Light sensitivity, sound sensitivity. Has wavy light aura. She has them 2x a month. This month she has had it a at a low level all month, always there. She takes tylenol, ibuprofen daily. She has neck and shoulder muscular pain. Imitrex doesn't work, makes her shoulder sore, hands hurt. She has nausea and vomiting with the headaches. She went back to school this month, that may be exacerbating the headaches. She has a new glasses prescription. She has 4 kids. No more kids, has an IUD. She gets to sleep but everything wakes her up. She can't nap during the day. No snoring. Migraine can be severe. Can be 10/10. 8-10 hours.   Review of Systems: Patient complains of symptoms per HPI as well as the following symptoms: denies CP, SOB. Pertinent negatives per HPI. All others negative.   History   Social History  . Marital Status: Married    Spouse Name: Susan Hopkins  . Number of Children: 4  . Years of Education: Ba   Occupational History  . [employer]    Social History Main Topics  . Smoking status: Never Smoker   . Smokeless tobacco: Never Used  . Alcohol Use: No  . Drug Use: No  . Sexual Activity: Yes   Other Topics Concern  . Not on file   Social History Narrative   Lives at home with husband and 4 kids.   Right handed.    Caffeine use: Coffee (2-3 cups/day)    Family History  Problem Relation Age of Onset  . Diabetes Mother   . Hypertension Mother   . Hyperlipidemia Mother   . Hypertension Father   . Hyperlipidemia Father   . Anesthesia problems Neg Hx   .  Hypotension Neg Hx   . Malignant hyperthermia Neg Hx   . Pseudochol deficiency Neg Hx     Past Medical History  Diagnosis Date  . ADVANCED MATERNAL AGE 65/02/2008  . DIABETES MELLITUS, GESTATIONAL, HX OF 05/18/2008  . 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 Contact dermatitis 08/27/2011  . 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 Migraine headache     last migraine in Sept 2012  . GERD (gastroesophageal reflux disease)     doesn't take anything for acid reflux  . Gastric ulcer     history of  . Hemorrhoids   . Constipation   . Diarrhea   . ANEMIA, IRON DEFICIENCY, UNSPEC. 02/12/2007    after IUD  . Diabetes mellitus without complication     gestational diab. currently & with infant #2  . Gestational diabetes A2/B 07/20/2012    Fetal echo-nml-under media tab, needs baseline labs-nml with nml TSH and 24 hour urine-68mg . To see optho in Nov 2013-Per pt. Ok, serial u/s for growth, 2x/wk testing at 32 wks.     Past Surgical History  Procedure Laterality Date  . Cesarean section  2005/2010  . Female circumcision  68  . Tonsillectomy  1989  . Dilation and curettage of uterus  2009  . Cholecystectomy  11/28/2011  Procedure: LAPAROSCOPIC CHOLECYSTECTOMY WITH INTRAOPERATIVE CHOLANGIOGRAM;  Surgeon: Wilmon Arms. Corliss Skains, MD;  Location: [facility] OR;  Service: General;  Laterality: N/A;  . Cesarean section N/A 01/22/2013    Procedure: CESAREAN SECTION;  Surgeon: Reva Bores, MD;  Location: [facility];  Service: Obstetrics;  Laterality: N/A;  Repeat  . Cesarean section  2005  . Cesarean section  2010    Current Outpatient Prescriptions  Medication Sig Dispense Refill  . ibuprofen (ADVIL,MOTRIN) 800 MG tablet Take 1 tablet (800 mg total) by mouth every 8 (eight) hours as needed for moderate pain. 30 tablet 1  . SUMAtriptan (IMITREX) 50 MG tablet Take 1 tablet (50 mg total) by mouth every 2 (two) hours as needed for migraine (max 2 tabs per day. Pump and  dump after use.). 30 tablet 0  . acetaminophen (TYLENOL) 325 MG tablet Take 650 mg by mouth every 6 (six) hours as needed for moderate pain.    Marland Kitchen amitriptyline (ELAVIL) 25 MG tablet Take 1 tablet (25 mg total) by mouth at bedtime. 30 tablet 6  . azelastine (OPTIVAR) 0.05 % ophthalmic solution Place 1 drop into both eyes 2 (two) times daily. (Patient not taking: Reported on 03/09/2015) 6 mL 0  . eletriptan (RELPAX) 40 MG tablet Take 1 tablet (40 mg total) by mouth as needed for migraine or headache. May repeat in 2 hours if headache persists or recurs. 3 tablet 0  . ondansetron (ZOFRAN ODT) 4 MG disintegrating tablet Take 1 tablet (4 mg total) by mouth every 8 (eight) hours as needed for nausea or vomiting. (Patient not taking: Reported on 03/09/2015) 20 tablet 0  . Prenatal Multivit-Min-Fe-FA (PRENATAL VITAMINS) 0.8 MG tablet Take 1 tablet by mouth daily. (Patient not taking: Reported on 03/09/2015) 30 tablet 12  . trimethoprim-polymyxin b (POLYTRIM) ophthalmic solution Place 1 drop into both eyes every 4 (four) hours. For 7 days. (Patient not taking: Reported on 03/09/2015) 10 mL 0   No current facility-administered medications for this visit.    Allergies as of 03/09/2015 - Review Complete 03/09/2015  Allergen Reaction Noted  . Oxycodone Itching 12/26/2011    Vitals: BP 120/72 mmHg  Pulse 84  Temp(Src) 98 F (36.7 C)  Ht  (1.676 m)  Wt 176 lb 8 oz (80.06 kg)  BMI 28.50 kg/m2 Last Weight:  Wt Readings from Last 1 Encounters:  03/09/15 176 lb 8 oz (80.06 kg)   Last Height:   Ht Readings from Last 1 Encounters:  03/09/15  (1.676 m)   Physical exam: Exam: Gen: NAD, conversant, well nourised, well groomed                     CV: RRR, no MRG. No Carotid Bruits. No peripheral edema, warm, nontender Eyes: Conjunctivae clear without exudates or hemorrhage  Neuro: Detailed Neurologic Exam  Speech:    Speech is normal; fluent and spontaneous with normal comprehension.    Cognition:    The patient is oriented to person, place, and time;     recent and remote memory intact;     language fluent;     normal attention, concentration,     fund of knowledge Cranial Nerves:    The pupils are equal, round, and reactive to light. The fundi are normal and spontaneous venous pulsations are present. Visual fields are full to finger confrontation. Extraocular movements are intact. Trigeminal sensation is intact and the muscles of mastication are normal. The face is symmetric. The palate elevates in the  midline. Hearing intact. Voice is normal. Shoulder shrug is normal. The tongue has normal motion without fasciculations.   Coordination:    Normal finger to nose and heel to shin. Normal rapid alternating movements.   Gait:    Heel-toe and tandem gait are normal.   Motor Observation:    No asymmetry, no atrophy, and no involuntary movements noted. Tone:    Normal muscle tone.    Posture:    Posture is normal. normal erect    Strength:    Strength is V/V in the upper and lower limbs.      Sensation: intact to LT     Reflex Exam:  DTR's:    Deep tendon reflexes in the upper and lower extremities are normal bilaterally.   Toes:    The toes are downgoing bilaterally.   Clonus:    Clonus is absent.       Assessment/Plan:  44 year old female with migraines, worsening.   Has never had brain imaging. Headaches/migraines now continuous, worsening. Will order MRI of the brain. Start Amitriptyline. EKG if the heart before starting. Relpax for acute management F/u 3 months   Naomie DeanAntonia Jeanifer Halliday, MD  [facility] [address] [place], [place] [id number]  Phone [phone] Fax [phone]

## 2015-03-09 NOTE — Patient Instructions (Addendum)
Overall you are doing fairly well but I do want to suggest a few things today:   Remember to drink plenty of fluid, eat healthy meals and do not skip any meals. Try to eat protein with a every meal and eat a healthy snack such as fruit or nuts in between meals. Try to keep a regular sleep-wake schedule and try to exercise daily, particularly in the form of walking, 20-30 minutes a day, if you can.   As far as your medications are concerned, I would like to suggest: Amitriptyline 25mg  at bedtime  Relpax: Please take one tablet at the onset of your headache. If it does not improve the symptoms please take one additional tablet. Do not take more then 2 tablets in 24hrs. Do not take use more then 2 to 3 times in a week.  As far as diagnostic testing: MRI brain, lab, EKG  I would like to see you back in 4 months, sooner if we need to. Please call us with any interim questions, concerns, problems, updates or refill requests.   Please also call us for any test results so we can go over those with you on the phone.  My clinical assistant and will answer any of your questions and relay your messages to me and also relay most of my messages to you.   Our phone number is [phone]. We also have an after hours call service for urgent matters and there is a physician on-call for urgent questions. For any emergencies you know to call 911 or go to the nearest emergency room

## 2015-03-10 ENCOUNTER — Telehealth: Payer: Self-pay | Admitting: *Deleted

## 2015-03-10 LAB — COMPREHENSIVE METABOLIC PANEL
ALT: 9 IU/L (ref 0–32)
AST: 10 IU/L (ref 0–40)
Albumin/Globulin Ratio: 1.5 (ref 1.1–2.5)
Albumin: 4.2 g/dL (ref 3.5–5.5)
Alkaline Phosphatase: 57 IU/L (ref 39–117)
BUN / CREAT RATIO: 19 (ref 9–23)
BUN: 8 mg/dL (ref 6–24)
Bilirubin Total: 0.2 mg/dL (ref 0.0–1.2)
CHLORIDE: 103 mmol/L (ref 97–108)
CO2: 25 mmol/L (ref 18–29)
Calcium: 9.1 mg/dL (ref 8.7–10.2)
Creatinine, Ser: 0.42 mg/dL — ABNORMAL LOW (ref 0.57–1.00)
GFR calc non Af Amer: 124 mL/min/{1.73_m2} (ref >59)
GFR, EST AFRICAN AMERICAN: 143 mL/min/{1.73_m2} (ref >59)
GLOBULIN, TOTAL: 2.8 g/dL (ref 1.5–4.5)
GLUCOSE: 109 mg/dL — AB (ref 65–99)
Potassium: 4.1 mmol/L (ref 3.5–5.2)
SODIUM: 140 mmol/L (ref 134–144)
Total Protein: 7 g/dL (ref 6.0–8.5)

## 2015-03-10 NOTE — Telephone Encounter (Signed)
Talked with patient about lab results that were within normal limits. Pt verbalized understanding.

## 2015-03-14 ENCOUNTER — Other Ambulatory Visit: Payer: Self-pay | Admitting: *Deleted

## 2015-03-14 MED ORDER — IBUPROFEN 800 MG PO TABS: 800.0000 mg | ORAL_TABLET | Freq: Three times a day (TID) | ORAL | Status: DC | PRN

## 2015-03-15 ENCOUNTER — Ambulatory Visit (INDEPENDENT_AMBULATORY_CARE_PROVIDER_SITE_OTHER): Payer: 59

## 2015-03-15 DIAGNOSIS — H539 Unspecified visual disturbance: Secondary | ICD-10-CM

## 2015-03-15 DIAGNOSIS — R519 Headache, unspecified: Secondary | ICD-10-CM

## 2015-03-15 DIAGNOSIS — R51 Headache: Secondary | ICD-10-CM

## 2015-03-17 ENCOUNTER — Telehealth: Payer: Self-pay | Admitting: *Deleted

## 2015-03-17 NOTE — Telephone Encounter (Signed)
Unable to leave message because voicemail inbox is full. Will try again Monday.

## 2015-03-20 ENCOUNTER — Telehealth: Payer: Self-pay | Admitting: *Deleted

## 2015-03-20 NOTE — Telephone Encounter (Signed)
Talked with pt about normal MRI results. Pt verbalized understanding. She also stated that the medication Dr. Lucia GaskinsAhern started her on (elavil/relpax) has been helping with her sleep and headaches. I told pt I would let her know.

## 2015-03-20 NOTE — Telephone Encounter (Signed)
Great, thank you!

## 2015-06-05 ENCOUNTER — Telehealth: Payer: Self-pay | Admitting: Neurology

## 2015-06-05 ENCOUNTER — Encounter: Payer: Self-pay | Admitting: *Deleted

## 2015-06-05 ENCOUNTER — Other Ambulatory Visit: Payer: Self-pay | Admitting: *Deleted

## 2015-06-05 DIAGNOSIS — R51 Headache: Principal | ICD-10-CM

## 2015-06-05 DIAGNOSIS — R519 Headache, unspecified: Secondary | ICD-10-CM

## 2015-06-05 MED ORDER — INDOMETHACIN 25 MG PO CAPS: 25.0000 mg | ORAL_CAPSULE | Freq: Every day | ORAL | Status: DC | PRN

## 2015-06-05 NOTE — Telephone Encounter (Signed)
Patient called and requested to receive a medication that is "stronger" than what she is taking for her headaches now. Please call and advise.

## 2015-06-05 NOTE — Progress Notes (Signed)
Placed Rx for indomethacin 25mg  (20 capsules- no refills) up front in envelope with pt name and DOB. Pt aware and picking up in office. Spoke with her to let her know this is an anti-inflammatory to help with her headache as needed.

## 2015-06-05 NOTE — Telephone Encounter (Signed)
Indomethacin 25 mg #20  No refill  One po prn migraine, can take with Relpax

## 2015-06-05 NOTE — Telephone Encounter (Signed)
Spoke with pt and she had a headache this past Friday and all day yesterday. She is wanting something stronger than the relpax and amitriptyline. Pt does not take amiriptyline every night because it makes her sleepy the next day. She states "It helps me sleep at night but makes me too sleepy the next day. I take it sometimes". She has taken Relpax and states it does not help. Asked if she has repeated the relpax after 2 hours as instructed per Dr. Lucia Gaskins and pt has not. I told her Dr. Lucia Gaskins is not in the office until Thursday and I have to ask the work in physician, Dr. Epimenio Foot and call her back. Pt verbalized understanding.

## 2015-06-05 NOTE — Progress Notes (Signed)
Would be good to take amitriptyline daily on regular basis to determine if it helps

## 2015-06-05 NOTE — Telephone Encounter (Signed)
Spoke with pt to let her know Dr. Epimenio Foot would like her to try and take amitriptyline after dinner to see if this helps with the sleepiness the next day and to make sure she is taking it daily. Pt stated she picked up Rx for indomethacin and I advised she can take that with the Relpax.  She verbalized understanding. I told her to call back if she had further questions.

## 2015-06-07 ENCOUNTER — Telehealth: Payer: Self-pay | Admitting: Neurology

## 2015-06-07 MED ORDER — ELETRIPTAN HYDROBROMIDE 40 MG PO TABS: 40.0000 mg | ORAL_TABLET | ORAL | Status: DC | PRN

## 2015-06-07 NOTE — Telephone Encounter (Signed)
Rx has been sent.  Receipt confirmed by pharmacy.   

## 2015-06-07 NOTE — Telephone Encounter (Signed)
Patient is calling to get a Rx called in for eletriptan (RELPAX) 40 MG tablet. Patient was given samples. Please call to [facility] on [address]. Thank you.

## 2015-06-12 ENCOUNTER — Other Ambulatory Visit: Payer: Self-pay

## 2015-07-19 ENCOUNTER — Ambulatory Visit (INDEPENDENT_AMBULATORY_CARE_PROVIDER_SITE_OTHER): Payer: 59 | Admitting: Family Medicine

## 2015-07-19 ENCOUNTER — Encounter: Payer: Self-pay | Admitting: Family Medicine

## 2015-07-19 VITALS — BP 140/79 | HR 85 | Temp 98.3°F | Ht 66.0 in | Wt 180.0 lb

## 2015-07-19 DIAGNOSIS — G43909 Migraine, unspecified, not intractable, without status migrainosus: Secondary | ICD-10-CM

## 2015-07-19 MED ORDER — DEXAMETHASONE SODIUM PHOSPHATE 10 MG/ML IJ SOLN
10.0000 mg | Freq: Once | INTRAMUSCULAR | Status: AC
  Administered 2015-07-19: 10 mg via INTRAMUSCULAR

## 2015-07-19 MED ORDER — DIPHENHYDRAMINE HCL 50 MG/ML IJ SOLN
25.0000 mg | Freq: Once | INTRAMUSCULAR | Status: AC
  Administered 2015-07-19: 25 mg via INTRAMUSCULAR

## 2015-07-19 MED ORDER — KETOROLAC TROMETHAMINE 30 MG/ML IJ SOLN
30.0000 mg | Freq: Once | INTRAMUSCULAR | Status: AC
  Administered 2015-07-19: 30 mg via INTRAMUSCULAR

## 2015-07-19 NOTE — Addendum Note (Signed)
Addended by: Jone Baseman D on: 07/19/2015 02:15 PM   Modules accepted: Orders

## 2015-07-19 NOTE — Assessment & Plan Note (Signed)
Migraine, treated in clinic with Toradol 30 mg, Benadryl 25 mg and Decadron 10 mg. ,  She reports HA improvement with Toradol at the last visit.  Advise not using indomethacin or ibuprofen for the remainder the day - Referred to [facility] - advised keeping HA diary

## 2015-07-19 NOTE — Progress Notes (Signed)
   Subjective:    Patient ID: Susan Hopkins, female    DOB: [birth year], 44 y.o.   MRN: [id number]  Seen for Same day visit for   CC: Headache  Reports headache since Sunday after fasting.  Tried Relpax and ibuprofen without benefit.  Headache is right-sided, throbbing, associated with photophobia, nausea and neck muscle tension.  Reports needs referral to [facility].  Denies previous MIs or strokes.  Denies fevers or chills.  Denies any problems with social allergies or sinuses.   Review of Systems   See HPI for ROS. Objective:  BP 140/79 mmHg  Pulse 85  Temp(Src) 98.3 F (36.8 C) (Oral)  Ht  (1.676 m)  Wt 180 lb (81.647 kg)  BMI 29.07 kg/m2  General: NAD Cardiac: RRR, normal heart sounds, no murmurs. 2+ radial and PT pulses bilaterally Respiratory: CTAB, normal effort Neuro: alert and oriented, no focal deficits    Assessment & Plan:  See Problem List Documentation

## 2015-07-27 ENCOUNTER — Encounter: Payer: Self-pay | Admitting: Obstetrics and Gynecology

## 2015-07-27 ENCOUNTER — Other Ambulatory Visit (HOSPITAL_COMMUNITY)
Admission: RE | Admit: 2015-07-27 | Discharge: 2015-07-27 | Disposition: A | Discharge status: Home / Self Care | Payer: 59 | Source: Ambulatory Visit | Attending: Family Medicine | Admitting: Family Medicine

## 2015-07-27 ENCOUNTER — Ambulatory Visit (INDEPENDENT_AMBULATORY_CARE_PROVIDER_SITE_OTHER): Payer: 59 | Admitting: Obstetrics and Gynecology

## 2015-07-27 VITALS — BP 128/75 | HR 93 | Temp 98.3°F | Ht 66.0 in | Wt 181.3 lb

## 2015-07-27 DIAGNOSIS — G43909 Migraine, unspecified, not intractable, without status migrainosus: Secondary | ICD-10-CM

## 2015-07-27 DIAGNOSIS — Z01419 Encounter for gynecological examination (general) (routine) without abnormal findings: Secondary | ICD-10-CM | POA: Diagnosis not present

## 2015-07-27 DIAGNOSIS — Z1151 Encounter for screening for human papillomavirus (HPV): Secondary | ICD-10-CM | POA: Diagnosis present

## 2015-07-27 DIAGNOSIS — T8389XA Other specified complication of genitourinary prosthetic devices, implants and grafts, initial encounter: Secondary | ICD-10-CM

## 2015-07-27 DIAGNOSIS — Z124 Encounter for screening for malignant neoplasm of cervix: Secondary | ICD-10-CM | POA: Diagnosis not present

## 2015-07-27 NOTE — Progress Notes (Signed)
    Subjective: Chief Complaint  Patient presents with  . Vaginal Bleeding    wants to remove Mirena as she has bleeding all the time and wants to replace with another IUD    HPI: Susan Hopkins is a 44 y.o. presenting to clinic today to discuss the following:  #Vaginal Bleeding: -patient states she has had continued bleeding from IUD -when she first had it placed in 2014 she had bleeding for the first few months but it stopped -states that for the last 3 months she has had on and off bleeding -describes it as a normal period  -she uses about 2 pads per day -states bleeding last about 10d then will stop for about 3 days and come right back -she says this is interfering with her life because of her culture (she is not able to fast) -it is causing her a lot of stress -she is requesting for this IUD to be taken out and to be placed back on the "50yr IUD" which she had previously. -[facility] -denies discharge, fever, n/v, pelvic pain  #Migraines: -still endorsing headaches -she was seen last week and referral for general neurologist was supposed to be placed -has followed with neuro before -uses Relpax now for migraine treatment but it is causing side effects such as CP, SOB, muscle aches -also thinks stress in life making headaches worse along with dehydration  Health Maintenance: Wants pap today with IUD removal   ROS in HPI.  Past Medical, Surgical, Social, and Family History Reviewed & Updated per EMR.   Objective: BP 128/75 mmHg  Pulse 93  Temp(Src) 98.3 F (36.8 C) (Oral)  Ht  (1.676 m)  Wt 181 lb 5 oz (82.243 kg)  BMI 29.28 kg/m2  Physical Exam  Constitutional: She is oriented to person, place, and time and well-developed, well-nourished, and in no distress.  Abdominal: Soft. There is no tenderness.  Genitourinary: Vagina normal. No vaginal discharge found.  Unable to visualize cervix well, changed  speculum x2, no IUD strings visualized   Neurological: She is alert and oriented to person, place, and time.  No focal deficits   Skin: Skin is warm and dry.    Assessment/Plan: Please see problem based Assessment and Plan   Orders Placed This Encounter  Procedures  . Ambulatory referral to Neurology    Referral Priority:  Routine    Referral Type:  Consultation    Referral Reason:  Specialty Services Required    Requested Specialty:  Neurology    Number of Visits Requested:  1  . Ambulatory referral to Gynecology    Referral Priority:  Routine    Referral Type:  Consultation    Referral Reason:  Specialty Services Required    Requested Specialty:  Gynecology    Number of Visits Requested:  1    Caryl Ada, DO 07/27/2015, 3:30 PM PGY-2, [facility]

## 2015-07-27 NOTE — Patient Instructions (Addendum)
Here are some of the things we discussed today: -Unfortunately I was unable to take out your IUD -Placing referral for Gyn clinic to get taken out and replace with another -I will let you know results of pap smear  Please schedule a follow-up appointment within 6 weeks to discuss other medical concerns   Thanks for allowing me to be a part of your care! Dr. Doroteo Glassman

## 2015-07-28 ENCOUNTER — Encounter: Payer: Self-pay | Admitting: Obstetrics and Gynecology

## 2015-07-28 DIAGNOSIS — T839XXA Unspecified complication of genitourinary prosthetic device, implant and graft, initial encounter: Secondary | ICD-10-CM

## 2015-07-28 HISTORY — DX: Unspecified complication of genitourinary prosthetic device, implant and graft, initial encounter: T83.9XXA

## 2015-07-28 NOTE — Assessment & Plan Note (Signed)
A: IUD cause abnormal bleeding that is affecting her life. Unable to visual IUD strings and remove IUD. IUD was difficult to remove the first time she had one as well. Tried 2 different speculums. Decided not to further explore as patient had discomfort. Requesting Paragard. P: Gyn referral placed for IUD removal. Patient requesting that removal and insertion be done at same time.

## 2015-07-28 NOTE — Assessment & Plan Note (Signed)
No migraine currently. Encouraged patient to watch for triggers and avoid. Referral for neuro placed since it was not at last visit.

## 2015-07-31 ENCOUNTER — Ambulatory Visit (INDEPENDENT_AMBULATORY_CARE_PROVIDER_SITE_OTHER): Payer: 59 | Admitting: Family Medicine

## 2015-07-31 VITALS — BP 135/72 | HR 85 | Temp 98.8°F | Ht 66.0 in | Wt 180.6 lb

## 2015-07-31 DIAGNOSIS — M25572 Pain in left ankle and joints of left foot: Secondary | ICD-10-CM

## 2015-07-31 DIAGNOSIS — M25571 Pain in right ankle and joints of right foot: Secondary | ICD-10-CM

## 2015-07-31 DIAGNOSIS — R600 Localized edema: Secondary | ICD-10-CM | POA: Diagnosis not present

## 2015-07-31 LAB — POCT SEDIMENTATION RATE: POCT SED RATE: 22 mm/h (ref 0–22)

## 2015-07-31 NOTE — Progress Notes (Signed)
Subjective   Susan Hopkins is a 44 y.o. female that presents for a same day visit  1. Lower extremity swelling: Symptoms started about 10 days ago with pain in left ankle followed by swelling and concurrent swelling in left leg. No injury. Elevating her legs helps with her swelling. Swelling gradually worsens as the day goes on. Pain is currently around her her ankles and the lateral aspects of both of her legs. She takes Advil two to three times per day which helps her pain. She states she started exercising but that was after having the pain. Overall, her pain is getting worse  ROS Per HPI  Social History  Substance Use Topics  . Smoking status: Never Smoker   . Smokeless tobacco: Never Used  . Alcohol Use: No    Allergies  Allergen Reactions  . Oxycodone Itching    All over the body    Objective   BP 135/72 mmHg  Pulse 85  Temp(Src) 98.8 F (37.1 C) (Oral)  Ht 5' 6" (1.676 m)  Wt 180 lb 9.6 oz (81.92 kg)  BMI 29.16 kg/m2  General: Well appearing, no distress Musculoskeletal: 1+ pitting edema to shins bilaterally Right ankle: tenderness along 3rd and 4th metatarsals with mild swelling and no erythema. Tenderness over medial malleolus. Full ROM. Pulses intact Left ankle: tenderness over lateral malleolus. Full ROM. Pulses intact  Assessment and Plan   No orders of the defined types were placed in this encounter.    Bilateral ankle swelling Ankle pain  RICE therapy  Follow-up if symptoms persist/worsen  Continue Advil PRN  May need x-rays if persists, although no trauma  Uric acid, CRP, ESR

## 2015-07-31 NOTE — Patient Instructions (Signed)
Thank you for coming to see me today. It was a pleasure. Today we talked about:   Leg pain/swelling: I will get some lab work initially. I doubt there is a fracture with no history of injury. I want you to use compression stockings when you are sitting down or walking around (take them off when sleeping). You can continue to take the Aleve as needed  If you have worsening pain, worsening swelling (especially one sided), redness in your legs, trouble breathing, fast heart rate, coughing blood or chest pain, please return immediately or go to the emergency room, as this may be related to a blood clot  Please make an appointment to see Dr. Doroteo Glassman in 1-2 weeks for follow-up.  If you have any questions or concerns, please do not hesitate to call the office at [phone].  Sincerely,  Jacquelin Hawking, MD

## 2015-08-01 ENCOUNTER — Encounter: Payer: Self-pay | Admitting: Family Medicine

## 2015-08-01 LAB — C-REACTIVE PROTEIN: CRP: 2.3 mg/dL — ABNORMAL HIGH (ref <0.60)

## 2015-08-01 LAB — URIC ACID: Uric Acid, Serum: 3.1 mg/dL (ref 2.4–7.0)

## 2015-08-01 LAB — CYTOLOGY - PAP

## 2015-08-02 ENCOUNTER — Encounter: Payer: Self-pay | Admitting: Family Medicine

## 2015-08-08 ENCOUNTER — Telehealth: Payer: Self-pay | Admitting: Obstetrics and Gynecology

## 2015-08-08 NOTE — Telephone Encounter (Signed)
Pt calling and states that she has not received her results from last visit as she was seen by Dr. Caleb Popp for swelling in legs and feet, and additionally, the swelling is causing pain in her toes now and would like to speak to someone about as to what to do about this. Sending to white team for provider that last seen patient, as well as blue team for pt's PCP.  Susan Hopkins, ASA

## 2015-08-08 NOTE — Telephone Encounter (Signed)
Will forward to Dr. Nettey. Denni France,CMA  

## 2015-08-10 ENCOUNTER — Telehealth: Payer: Self-pay | Admitting: Obstetrics and Gynecology

## 2015-08-10 ENCOUNTER — Encounter: Payer: Self-pay | Admitting: Obstetrics and Gynecology

## 2015-08-10 ENCOUNTER — Ambulatory Visit (INDEPENDENT_AMBULATORY_CARE_PROVIDER_SITE_OTHER): Payer: 59 | Admitting: Obstetrics and Gynecology

## 2015-08-10 ENCOUNTER — Ambulatory Visit: Payer: Self-pay | Admitting: Family Medicine

## 2015-08-10 VITALS — BP 143/84 | HR 86 | Wt 183.3 lb

## 2015-08-10 DIAGNOSIS — Z30433 Encounter for removal and reinsertion of intrauterine contraceptive device: Secondary | ICD-10-CM | POA: Diagnosis not present

## 2015-08-10 MED ORDER — PARAGARD INTRAUTERINE COPPER IU IUD
1.0000 | INTRAUTERINE_SYSTEM | Freq: Once | INTRAUTERINE | Status: AC
  Administered 2015-08-10: 1 via INTRAUTERINE

## 2015-08-10 NOTE — Addendum Note (Signed)
Addended by: Sherre Lain A on: 08/10/2015 02:12 PM   Modules accepted: Orders

## 2015-08-10 NOTE — Progress Notes (Signed)
Patient ID: Susan Hopkins, female   DOB: 08/06/1971, 44 y.o.   MRN: [id number] 44 yo [id number] presenting today to have Mirena IUD removed and have Paraguard IUD inserted. Patient desires a change in the IUD due to unpredictable vaginal bleeding interfering with her religion. She has had the Paraguard IUD previously  IUD procedure note  Patient identified, informed consent performed, signed copy in chart, time out was performed.  Urine pregnancy test negative.  Informed consent was obtained. She was placed in the dorsal lithotomy position, and was prepped and draped in a sterile manner. After an adequate timeout was performed, attention was turned to her pelvis where a speculum was placed in the vagina.  The cervix was visualized and grasped anteriorly with a single tooth tenaculum.  The strings of the IUD were not visualized, an IUD hook was introduced into the endometrial cavity. The IUD was grasped and removed in its entirety.Uterus sounded to 8 cm. The paraguard IUD was than inserted per manufacturer's recommendations.  Strings trimmed to 3 cm. Tenaculum was removed, good hemostasis noted.  Patient tolerated procedure well.   Patient given post procedure instructions and Paraguard care card with expiration date.  Patient is asked to check IUD strings periodically and follow up in 4-6 weeks for IUD check.

## 2015-08-10 NOTE — Telephone Encounter (Signed)
Dianne from [facility], is calling for a [facility] referral to be placed for the pt for [facility] located at [address] in [place], [place].   She will be seeing Dr. Terrace Arabia; for August 22, 2015;  She does not have a diag code but she states that it is for migraines. Thank you, Dorothey Baseman, ASA

## 2015-08-10 NOTE — Telephone Encounter (Signed)
[id number]  08/10/15 -- 02/10/16  6 visits

## 2015-08-10 NOTE — Telephone Encounter (Signed)
Symptoms have worsened. Swelling into feet as well. She is using Tylenol for her pain. She is no longer using Advil. She elevated her legs which helps but not long term. Symptoms appear related to dependent edema. She now states she has had problems like this before, but not to this degree. A letter with results was sent to her recently. CRP was elevated on last blood draw with normal ESR. No recent trauma. Patient to follow-up tomorrow with Dr. Lorenso Courier. Discussed with patient that this would be the best course of action at this time. Patient okay with plan and will follow-up tomorrow.

## 2015-08-10 NOTE — Progress Notes (Signed)
Pt wants mirena removed because she never knows when she will have bleeding. She had paragard before and she could predict her periods.

## 2015-08-10 NOTE — Telephone Encounter (Signed)
Will forward to referral coordinator. Jazmin Hartsell,CMA  

## 2015-08-11 ENCOUNTER — Encounter: Payer: Self-pay | Admitting: Family Medicine

## 2015-08-11 ENCOUNTER — Ambulatory Visit (INDEPENDENT_AMBULATORY_CARE_PROVIDER_SITE_OTHER): Payer: 59 | Admitting: Family Medicine

## 2015-08-11 VITALS — BP 129/65 | HR 94 | Temp 98.2°F | Ht 66.0 in | Wt 182.2 lb

## 2015-08-11 DIAGNOSIS — R600 Localized edema: Secondary | ICD-10-CM | POA: Diagnosis not present

## 2015-08-11 NOTE — Progress Notes (Signed)
Patient ID: Susan Hopkins, female   DOB: 09-30-1971, 44 y.o.   MRN: [id number]    Subjective: CC: bilateral LE swelling HPI: Patient is a 44 y.o. female with a past medical history of lower extremity edema presenting to clinic today for a SDA for LE edema.  Follow up on LE swelling: Patient was seen about 2 weeks ago for LE swelling. She states that the swelling is worsening in her LEs b/l. She notes that the swelling has now gone from solely involving the ankle to now involving the dorsum of her feet and up to midcalf. She has a pulling pain/pressure constantly that worsens throughout the day to the point where its the worst in the evening. Swelling is much improved in the AM but still present. No swelling in any other regions.   No know trauma/injury.  She has not gotten compression stockings that were prescribed previously. She has been elevating her legs at night. She used to have swelling/pain in her legs when she was on her feet all day but states it has never been this severe. Taking Tylenol for the pain.   Social History: never smoker   ROS: All other systems reviewed and are negative.  Past Medical History Patient Active Problem List   Diagnosis Date Noted  . Lower extremity edema 08/11/2015  . IUD complication 87/56/4332  . Daily headache 03/09/2015  . Vision changes 03/09/2015  . Pain at injection site 01/12/2015  . URI (upper respiratory infection) 12/27/2014  . Elevated liver enzymes 08/26/2012  . Mammographic breast lesion 07/30/2012  . Gestational diabetes A2/B 07/20/2012  . Overweight (BMI 25.0-29.9) 10/15/2011  . Migraine 01/09/2011  . ANEMIA, IRON DEFICIENCY, UNSPEC. 02/12/2007  . GASTROESOPHAGEAL REFLUX, NO ESOPHAGITIS 02/12/2007    Medications- reviewed and updated Current Outpatient Prescriptions  Medication Sig Dispense Refill  . acetaminophen (TYLENOL) 325 MG tablet Take 650 mg by mouth every 6 (six) hours as needed for moderate pain.    Marland Kitchen amitriptyline  (ELAVIL) 25 MG tablet Take 1 tablet (25 mg total) by mouth at bedtime. (Patient not taking: Reported on 08/10/2015) 30 tablet 6  . azelastine (OPTIVAR) 0.05 % ophthalmic solution Place 1 drop into both eyes 2 (two) times daily. (Patient not taking: Reported on 03/09/2015) 6 mL 0  . eletriptan (RELPAX) 40 MG tablet Take 1 tablet (40 mg total) by mouth as needed for migraine. May repeat in 2 hours if headache persists or recurs. (Patient not taking: Reported on 08/10/2015) 8 tablet 0  . ibuprofen (ADVIL,MOTRIN) 800 MG tablet Take 1 tablet (800 mg total) by mouth every 8 (eight) hours as needed for moderate pain. (Patient not taking: Reported on 08/10/2015) 30 tablet 1  . indomethacin (INDOCIN) 25 MG capsule Take 1 capsule (25 mg total) by mouth daily as needed. (Patient not taking: Reported on 08/10/2015) 20 capsule 0  . ondansetron (ZOFRAN ODT) 4 MG disintegrating tablet Take 1 tablet (4 mg total) by mouth every 8 (eight) hours as needed for nausea or vomiting. (Patient not taking: Reported on 03/09/2015) 20 tablet 0  . trimethoprim-polymyxin b (POLYTRIM) ophthalmic solution Place 1 drop into both eyes every 4 (four) hours. For 7 days. (Patient not taking: Reported on 03/09/2015) 10 mL 0   No current facility-administered medications for this visit.    Objective: Office vital signs reviewed. BP 129/65 mmHg  Pulse 94  Temp(Src) 98.2 F (36.8 C) (Oral)  Ht 5' 6"  (1.676 m)  Wt 182 lb 3.2 oz (82.645 kg)  BMI 29.42 kg/m2  Physical Examination:  General: Awake, alert, well- nourished, NAD MSK: No discoloration of the LE. No ecchymoses. 1+ pitting edema up to mid-shin bilaterally. TTP along the metatarsals bilaterally. TTP of the lateral malleolus on the L.  TTP of both the medial and lateral malleolus on the R.  Diffuse swelling of the dorsum of the feet b/l.  Full active range of motion.  Normal gait and station. 2+ DPs b/l.  Skin: dry, intact, no rashes or lesions  Assessment/Plan: Lower extremity  edema Given that the swelling is worse in the evening and improved in the AM, the patient's presentation for gravity dependent edema. No h/o trauma therefore I believe the pain is secondary to compression from the edema. No need for imaging currently. No size discrepancy concerning for DVTs. Good pulses b/l. Uric acid is not elevated and pt's presentation not c/w gout. ESR normal, CRP slightly elevated however swelling not c/w arthritis.  - discussed importance of compression stockings  - continue to elevated legs at night  - Ibuprofen as needed for pain  - patient to follow up if symptoms are worsening as needed, otherwise to f/u with PCP in 4 weeks.     No orders of the defined types were placed in this encounter.    No orders of the defined types were placed in this encounter.    Archie Patten PGY-2, [place]

## 2015-08-11 NOTE — Assessment & Plan Note (Signed)
Given that the swelling is worse in the evening and improved in the AM, the patient's presentation for gravity dependent edema. No h/o trauma therefore I believe the pain is secondary to compression from the edema. No need for imaging currently. No size discrepancy concerning for DVTs. Good pulses b/l. Uric acid is not elevated and pt's presentation not c/w gout. ESR normal, CRP slightly elevated however swelling not c/w arthritis.  - discussed importance of compression stockings  - continue to elevated legs at night  - Ibuprofen as needed for pain  - patient to follow up if symptoms are worsening as needed, otherwise to f/u with PCP in 4 weeks.

## 2015-08-11 NOTE — Patient Instructions (Signed)
It was nice to meet you.   Your labwork was not concerning for gout or any acute infection.  I doubt there is a fracture with no history of injury and the fact that your swelling is in both legs.  I think the compression stockings will be very helpful. Wear these when you are sitting down or walking around (take them off when sleeping). Ibuprofen may be helpful for the pain as needed  If you have worsening pain, worsening swelling (especially one sided) despite the compression stockings, redness in your legs, trouble breathing, fast heart rate, coughing blood or chest pain, please return immediately or go to the emergency room, as this may be related to a blood clot  Please make an appointment to see Dr. Doroteo Glassman in approximately 2-4 weeks.   If you have any questions or concerns, please do not hesitate to call the office at [phone].

## 2015-08-15 ENCOUNTER — Ambulatory Visit: Payer: Self-pay | Admitting: Family Medicine

## 2015-08-16 ENCOUNTER — Ambulatory Visit: Payer: Self-pay | Admitting: Obstetrics & Gynecology

## 2015-08-22 ENCOUNTER — Emergency Department (HOSPITAL_COMMUNITY): Payer: No Typology Code available for payment source

## 2015-08-22 ENCOUNTER — Ambulatory Visit: Payer: 59 | Admitting: Neurology

## 2015-08-22 ENCOUNTER — Emergency Department (HOSPITAL_COMMUNITY)
Admission: EM | Admit: 2015-08-22 | Discharge: 2015-08-22 | Disposition: A | Discharge status: Home / Self Care | Payer: No Typology Code available for payment source | Attending: Emergency Medicine | Admitting: Emergency Medicine

## 2015-08-22 ENCOUNTER — Encounter (HOSPITAL_COMMUNITY): Payer: Self-pay | Admitting: *Deleted

## 2015-08-22 ENCOUNTER — Telehealth: Payer: Self-pay | Admitting: *Deleted

## 2015-08-22 DIAGNOSIS — S80212A Abrasion, left knee, initial encounter: Secondary | ICD-10-CM | POA: Insufficient documentation

## 2015-08-22 DIAGNOSIS — Y9389 Activity, other specified: Secondary | ICD-10-CM | POA: Insufficient documentation

## 2015-08-22 DIAGNOSIS — Y92410 Unspecified street and highway as the place of occurrence of the external cause: Secondary | ICD-10-CM | POA: Diagnosis not present

## 2015-08-22 DIAGNOSIS — T148 Other injury of unspecified body region: Secondary | ICD-10-CM | POA: Insufficient documentation

## 2015-08-22 DIAGNOSIS — Z862 Personal history of diseases of the blood and blood-forming organs and certain disorders involving the immune mechanism: Secondary | ICD-10-CM | POA: Diagnosis not present

## 2015-08-22 DIAGNOSIS — Z8742 Personal history of other diseases of the female genital tract: Secondary | ICD-10-CM | POA: Insufficient documentation

## 2015-08-22 DIAGNOSIS — Z8719 Personal history of other diseases of the digestive system: Secondary | ICD-10-CM | POA: Diagnosis not present

## 2015-08-22 DIAGNOSIS — Z872 Personal history of diseases of the skin and subcutaneous tissue: Secondary | ICD-10-CM | POA: Diagnosis not present

## 2015-08-22 DIAGNOSIS — E119 Type 2 diabetes mellitus without complications: Secondary | ICD-10-CM | POA: Insufficient documentation

## 2015-08-22 DIAGNOSIS — Z8679 Personal history of other diseases of the circulatory system: Secondary | ICD-10-CM | POA: Diagnosis not present

## 2015-08-22 DIAGNOSIS — Z87448 Personal history of other diseases of urinary system: Secondary | ICD-10-CM | POA: Diagnosis not present

## 2015-08-22 DIAGNOSIS — S0990XA Unspecified injury of head, initial encounter: Secondary | ICD-10-CM | POA: Diagnosis present

## 2015-08-22 DIAGNOSIS — Z8632 Personal history of gestational diabetes: Secondary | ICD-10-CM | POA: Diagnosis not present

## 2015-08-22 DIAGNOSIS — Y998 Other external cause status: Secondary | ICD-10-CM | POA: Diagnosis not present

## 2015-08-22 DIAGNOSIS — T07XXXA Unspecified multiple injuries, initial encounter: Secondary | ICD-10-CM

## 2015-08-22 DIAGNOSIS — S299XXA Unspecified injury of thorax, initial encounter: Secondary | ICD-10-CM | POA: Diagnosis not present

## 2015-08-22 DIAGNOSIS — S199XXA Unspecified injury of neck, initial encounter: Secondary | ICD-10-CM | POA: Diagnosis not present

## 2015-08-22 MED ORDER — IBUPROFEN 800 MG PO TABS: 800.0000 mg | ORAL_TABLET | Freq: Three times a day (TID) | ORAL | Status: DC

## 2015-08-22 MED ORDER — CYCLOBENZAPRINE HCL 10 MG PO TABS: 10.0000 mg | ORAL_TABLET | Freq: Three times a day (TID) | ORAL | Status: DC | PRN

## 2015-08-22 MED ORDER — IBUPROFEN 800 MG PO TABS
800.0000 mg | ORAL_TABLET | Freq: Once | ORAL | Status: AC
  Administered 2015-08-22: 800 mg via ORAL
  Filled 2015-08-22: qty 1

## 2015-08-22 NOTE — Discharge Instructions (Signed)
Contusion °A contusion is a deep bruise. Contusions are the result of an injury that caused bleeding under the skin. The contusion may turn blue, purple, or yellow. Minor injuries will give you a painless contusion, but more severe contusions may stay painful and swollen for a few weeks.  °CAUSES  °A contusion is usually caused by a blow, trauma, or direct force to an area of the body. °SYMPTOMS  °· Swelling and redness of the injured area. °· Bruising of the injured area. °· Tenderness and soreness of the injured area. °· Pain. °DIAGNOSIS  °The diagnosis can be made by taking a history and physical exam. An X-ray, CT scan, or MRI may be needed to determine if there were any associated injuries, such as fractures. °TREATMENT  °Specific treatment will depend on what area of the body was injured. In general, the best treatment for a contusion is resting, icing, elevating, and applying cold compresses to the injured area. Over-the-counter medicines may also be recommended for pain control. Ask your caregiver what the best treatment is for your contusion. °HOME CARE INSTRUCTIONS  °· Put ice on the injured area. °¨ Put ice in a plastic bag. °¨ Place a towel between your skin and the bag. °¨ Leave the ice on for 15-20 minutes, 3-4 times a day, or as directed by your health care provider. °· Only take over-the-counter or prescription medicines for pain, discomfort, or fever as directed by your caregiver. Your caregiver may recommend avoiding anti-inflammatory medicines (aspirin, ibuprofen, and naproxen) for 48 hours because these medicines may increase bruising. °· Rest the injured area. °· If possible, elevate the injured area to reduce swelling. °SEEK IMMEDIATE MEDICAL CARE IF:  °· You have increased bruising or swelling. °· You have pain that is getting worse. °· Your swelling or pain is not relieved with medicines. °MAKE SURE YOU:  °· Understand these instructions. °· Will watch your condition. °· Will get help right  away if you are not doing well or get worse. °Document Released: 09/11/2005 Document Revised: 12/07/2013 Document Reviewed: 10/07/2011 °[employer]® Patient Information ©2015 [employer]. This information is not intended to replace advice given to you by your health care provider. Make sure you discuss any questions you have with your health care provider. ° °
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
## 2015-08-22 NOTE — ED Notes (Signed)
Patient transported to CT 
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
## 2015-08-22 NOTE — ED Notes (Signed)
Patient transported to CT and X ray 
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
## 2015-09-04 ENCOUNTER — Ambulatory Visit: Payer: Self-pay | Admitting: Family Medicine

## 2015-09-06 ENCOUNTER — Ambulatory Visit: Payer: Self-pay | Admitting: Obstetrics and Gynecology

## 2015-09-07 ENCOUNTER — Ambulatory Visit: Payer: Self-pay | Admitting: Family Medicine

## 2015-09-15 ENCOUNTER — Ambulatory Visit: Payer: Self-pay | Admitting: Family Medicine

## 2015-09-19 ENCOUNTER — Ambulatory Visit: Payer: Self-pay | Admitting: Family Medicine

## 2015-09-22 ENCOUNTER — Ambulatory Visit: Payer: Self-pay | Admitting: Obstetrics and Gynecology

## 2015-09-25 ENCOUNTER — Encounter: Payer: Self-pay | Admitting: Internal Medicine

## 2015-09-25 ENCOUNTER — Ambulatory Visit (INDEPENDENT_AMBULATORY_CARE_PROVIDER_SITE_OTHER): Payer: 59 | Admitting: Internal Medicine

## 2015-09-25 VITALS — BP 127/74 | HR 88 | Temp 98.2°F | Ht 66.0 in | Wt 180.3 lb

## 2015-09-25 DIAGNOSIS — M25473 Effusion, unspecified ankle: Secondary | ICD-10-CM

## 2015-09-25 DIAGNOSIS — Z111 Encounter for screening for respiratory tuberculosis: Secondary | ICD-10-CM | POA: Diagnosis not present

## 2015-09-25 DIAGNOSIS — Z789 Other specified health status: Secondary | ICD-10-CM | POA: Diagnosis not present

## 2015-09-25 DIAGNOSIS — R600 Localized edema: Secondary | ICD-10-CM | POA: Diagnosis not present

## 2015-09-25 DIAGNOSIS — R609 Edema, unspecified: Secondary | ICD-10-CM

## 2015-09-25 DIAGNOSIS — Z23 Encounter for immunization: Secondary | ICD-10-CM

## 2015-09-25 LAB — CBC
HCT: 38.2 % (ref 36.0–46.0)
Hemoglobin: 12.4 g/dL (ref 12.0–15.0)
MCH: 24.2 pg — AB (ref 26.0–34.0)
MCHC: 32.5 g/dL (ref 30.0–36.0)
MCV: 74.6 fL — ABNORMAL LOW (ref 78.0–100.0)
MPV: 9.1 fL (ref 8.6–12.4)
PLATELETS: 271 10*3/uL (ref 150–400)
RBC: 5.12 MIL/uL — AB (ref 3.87–5.11)
RDW: 15.6 % — ABNORMAL HIGH (ref 11.5–15.5)
WBC: 5.1 10*3/uL (ref 4.0–10.5)

## 2015-09-25 NOTE — Patient Instructions (Addendum)
Thank you for coming. Please continue to use your stockings every day (not while asleep). Continue to do foot exercises if this is helping. Continue to elevate your legs. Stop using Ibuprofen for your pain; this may be contributing to the ankle swelling. You can use Tylenol as needed for pain. Follow up with your PCP for your lab results and also regarding your other concerns that were not discussed today.

## 2015-09-25 NOTE — Progress Notes (Signed)
Patient ID: Susan Hopkins, female   DOB: 24-Dec-1970, 44 y.o.   MRN: [id number]  Subjective:   CC: swollen feet  HPI:  Bilateral LE Edema:  Notes of LE edema for the past 1.5-2 months. Swelling extends up to mid shin at times. Worsens with prolonged standing. Improves with elevation of feet. Also notes of bilateral foot pain that is dull and is located on plantar and dorsal aspects of feet. Pain on dorsal aspect worsens when swelling worsens. The pain on the plantar aspect is worst in the morning and improves with walking. She has been doing foot exercises which is helping with the pain; has also been using ibuprofen 847m daily which helps as well. Patient was seen in clinic for swelling in August and was given compression stockings. Patient has been wearing stockings intermittently; she states stockings help swelling, and overall the swelling has improved. Denies skin changes.   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    Review of Systems - Per HPI. Additionally, denies SOB at rest or with exertion, orthopnea, calf pain.   PMH, FH, or SH Smoking status: never smoker    Objective:  Physical Exam BP 127/74 mmHg  Pulse 88  Temp(Src) 98.2 F (36.8 C) (Oral)  Ht 5' 6"  (1.676 m)  Wt 180 lb 5 oz (81.789 kg)  BMI 29.12 kg/m2 GEN: NAD LE: swelling noted in bilateral feet up to ankle, no pitting edema, mild discomfort to palpation of lateral malleolus bilaterally and long the 5th metatarsal bilaterally. No skin changes. Sensation to light touch intact. DP pulses intact bilaterally. Corona phlebectasia? Noted on lateral aspect of left ankle.   Assessment:     Susan Haseleyis a 44y.o. female with new onset LE edema for the past 1.5-2 months.  Likely  dependent edema, however should consider anemia  vs liver disease vs kidney disease. No history of heart disease and denies symptoms of heart failure such as orthopnea/SOB; less likely this is due to heart failure. Has not had labs done since March 2016, however past labs do not show concern for liver or kidney disease .     Plan:     # See problem list and after visit summary for problem-specific plans.  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   Immunity for MMR/Varicella: - ordered Ig G for MMR and Varicella Screening for TB - ordered Quantiferon Gold   Immunization: received flu vaccine today  Follow-up: Follow up if symptoms do not improve and if labs results return abnormal   KSmiley Houseman MD [place]

## 2015-09-25 NOTE — Assessment & Plan Note (Signed)
Bilateral ankle edema for the past 1.5-2 months. Likely dependent edema, however should consider anemia vs liver disease vs kidney disease. No history of heart disease and denies symptoms of heart failure such as orthopnea/SOB; less likely this is due to heart failure. Has not had labs done since March 2016, however past labs do not show concern for liver or kidney disease. Not likely DVT as this is bilateral ankle swelling and no calf tenderness/swelling/erythema.  - ordered CBC, CMET for further evaluation  - encouraged to wear stockings regularly (everyday)  - advised to stop using Ibuprofen/NSAIDs for pain management as this may be contributing to her swelling - Tylenol PRN for pain

## 2015-09-26 ENCOUNTER — Ambulatory Visit: Payer: Self-pay | Admitting: Family Medicine

## 2015-09-26 LAB — COMPREHENSIVE METABOLIC PANEL
ALT: 14 U/L (ref 6–29)
AST: 14 U/L (ref 10–35)
Albumin: 4.2 g/dL (ref 3.6–5.1)
Alkaline Phosphatase: 61 U/L (ref 33–115)
BUN: 7 mg/dL (ref 7–25)
CHLORIDE: 104 mmol/L (ref 98–110)
CO2: 25 mmol/L (ref 20–31)
CREATININE: 0.37 mg/dL — AB (ref 0.50–1.10)
Calcium: 9.4 mg/dL (ref 8.6–10.2)
Glucose, Bld: 76 mg/dL (ref 65–99)
Potassium: 4.3 mmol/L (ref 3.5–5.3)
SODIUM: 137 mmol/L (ref 135–146)
TOTAL PROTEIN: 6.9 g/dL (ref 6.1–8.1)
Total Bilirubin: 0.3 mg/dL (ref 0.2–1.2)

## 2015-09-26 LAB — VARICELLA ZOSTER ANTIBODY, IGG: VARICELLA IGG: 1242 {index} — AB (ref <135.00)

## 2015-09-26 LAB — MEASLES/MUMPS/RUBELLA IMMUNITY
Mumps IgG: 272 AU/mL — ABNORMAL HIGH (ref <9.00)
Rubella: 20.4 Index — ABNORMAL HIGH (ref <0.90)
Rubeola IgG: >300 AU/mL — ABNORMAL HIGH (ref <25.00)

## 2015-09-28 ENCOUNTER — Other Ambulatory Visit: Payer: Self-pay | Admitting: Internal Medicine

## 2015-09-28 ENCOUNTER — Telehealth: Payer: Self-pay | Admitting: *Deleted

## 2015-09-28 ENCOUNTER — Telehealth: Payer: Self-pay | Admitting: Internal Medicine

## 2015-09-28 LAB — QUANTIFERON TB GOLD ASSAY (BLOOD)
INTERFERON GAMMA RELEASE ASSAY: NEGATIVE
QUANTIFERON TB AG MINUS NIL: 0.02 [IU]/mL
Quantiferon Nil Value: 0.09 IU/mL
TB Ag value: 0.11 IU/mL

## 2015-09-28 NOTE — Telephone Encounter (Signed)
-----  Message from Smiley Houseman, MD sent at 09/28/2015  6:11 AM EDT ----- Please let patient know that her MMR and varicella titers show that she has immunity, therefore she does not need to get vaccines for these. Please let me know if she needs a letter stating this, and if so how she would like the results (copy placed at front desk or letter sent in mail). Thank you

## 2015-09-28 NOTE — Telephone Encounter (Signed)
Letter placed up front for patient to pick up. Jazmin Hartsell,CMA

## 2015-09-28 NOTE — Telephone Encounter (Signed)
Patient is aware of titer results and would like a letter and copy of results placed up front for her to pick up.  She would also like to know the results of her other labs related to the swelling.  Jazmin Hartsell,CMA

## 2015-09-28 NOTE — Telephone Encounter (Signed)
Called patient to give results of lab work: normal CBC and CMET. 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 Patient understood and did not have questions regarding this.

## 2015-09-29 ENCOUNTER — Other Ambulatory Visit: Payer: Self-pay | Admitting: Internal Medicine

## 2015-11-03 ENCOUNTER — Ambulatory Visit (INDEPENDENT_AMBULATORY_CARE_PROVIDER_SITE_OTHER): Payer: 59 | Admitting: Obstetrics and Gynecology

## 2015-11-03 ENCOUNTER — Encounter: Payer: Self-pay | Admitting: Obstetrics and Gynecology

## 2015-11-03 VITALS — BP 124/80 | HR 64 | Temp 98.0°F | Ht 66.0 in | Wt 181.0 lb

## 2015-11-03 DIAGNOSIS — I872 Venous insufficiency (chronic) (peripheral): Secondary | ICD-10-CM | POA: Diagnosis not present

## 2015-11-03 DIAGNOSIS — R5383 Other fatigue: Secondary | ICD-10-CM

## 2015-11-03 DIAGNOSIS — R21 Rash and other nonspecific skin eruption: Secondary | ICD-10-CM

## 2015-11-03 MED ORDER — HYDROCORTISONE 1 % EX OINT: 1.0000 "application " | TOPICAL_OINTMENT | Freq: Two times a day (BID) | CUTANEOUS | Status: DC

## 2015-11-03 NOTE — Progress Notes (Signed)
Patient ID: Susan Hopkins, female   DOB: 1971/05/14, 44 y.o.   MRN: [id number]   Subjective: CC:  Ankle/foot swelling, fatigue HPI: This history was provided by the patient.  Patient is a 44 year-old female whose PMH is significant for migraines, URI, GERD, anemia, gestational DM and lower extremity edema.  Patient presents today with several complaints:  1. ANKLE/FOOT Swelling and Pain Patient states she has had bilateral lower extremity pain and swelling x3 months.  She was evaluated for this issue here in clinic last month.  Patient was prescribed compression stockings to wear during waking hours and states they have helped the pain and swelling.  Patient elevates her legs/feet while sleeping and states when she awakens in the mornings, her feet and ankles are not swollen.  The swelling is progressively worse throughout the day.  Patient denies numbness/tingling in her legs and feet.  Patient has used Tylenol arthritis to treat pain with relief.    2. FATIGUE Patient states for past year she has been tired even upon waking.  Patient states she typically gets less than 6 hours of sleep each night.  She states she has no difficulty falling asleep, but has a 46 year-old who often wakes her during sleep.  Patient works part-time and is a Physicist, medicalfull-time student.  Patient also endorses cold intolerance, dry skin and hair loss over past year.  Patient denies unexplained weight gain/loss over last year.  3. RIGHT AREOLA Pruritis/dryness Patient states over past 3-6 months her right areola has been dry and pruritic.  Patient denies pain or swelling in breast and denies drainage from nipple.  She has intermittently used petroleum jelly on nipple with some relief.  Social History Reviewed: Never smoker. FamHx and MedHx updated.  Please see EMR. Health Maintenance: Patient is due a variety of labs and some vaccinations.  She is to schedule an appointment for an annual physical exam.  ROS: General:  Patient  endorses generalized fatigue, hair loss and cold intolerance x1 year.  Patient denies fever, chills, unexplained weight loss/gain.  Denies increased thirst or hunger.  HEENT: Patient denies lymphadenopathy, sore throat, nasal congestion/drainage, otalgia, eye irritation/drainage and ear ache Cardio: Patient denies chest pain or tightness, palpitations, diaphoresis, orthopnea Pulm: Patient denies wheezing, dyspnea and cough GI:  Patient denies N/V/D, constipation and abdominal pain GU: Patient endorses dry/itchy right areola.  Denies discharge from nipple, denies pain and swelling of right breast.  Denies hematuria, vaginal discharge, dysuria, urinary frequency/urgency. Neuro: Patient has a history of migraines and endorses occasional headaches which improve with Tylenol.  Patient denies dizziness, changes in vision and altered gait  Objective: Office vital signs reviewed. BP 124/80 mmHg  Pulse 64  Temp(Src) 98 F (36.7 C)  Ht 5\' 6"  (1.676 m)  Wt 181 lb (82.101 kg)  BMI 29.23 kg/m2  Physical Examination:  General: Pleasant 44 year-old woman who appears stated age.  Awake, alert, well-nourished, NAD HEENT: Normal    Neck: No masses palpated. No LAD    Eyes: Sclera white with no injection.  No drainage noted.    Nose: nasal turbinates moist, no rhinorrhea    Throat: MMM, no erythema Cardio: Trace edema LLE.  No edema RLE.  No JVD.  RRR, S1S2 heard, no murmurs appreciated, cyanosis or clubbing; +2 radial and dorsalis pedis pulses bilaterally Pulm: CTAB, no wheezes, rhonchi or rales GI: soft non-tender, not distended, BS x4 Breast: Some dryness/cracking to lower right areola with macular rash.  No drainage or edema noted.  No masses on palpation.  No LAD noted with palpation of right axillary lymph nodes. Neuro: Strength and sensation grossly intact, non-focal  Assessment/ Plan: 45 y.o. female presents with several complaints.  Ankle/foot pain/swelling: Most likely venous insufficiency  as the edema is dependent in nature and progresses throughout the day.  Patient had a CBC and chemistry drawn last month and all values were WNL. Symptoms and exam inconsistent with cardiac etiology.  - Encouraged patient to perform calf exercises as tolerated  - Encouraged patient to elevate feet and legs as often as possible during the day  - Encouraged patient to continue using compression stockings as they seem to be helping with edema and pain  - Patient may continue to use Tylenol to treat pain, discouraged use of ibuprofen products  Fatigue: Patient presents with some symptoms that are consistent with hypothyroidism, but her last TSH in 2015 was WNL.  Patient has hx of gestational diabetes, but denies excessive thirst, urination and pain that is neuropathic in nature.  She reports sleeping 6 or fewer hours each night and leads a busy lifestyle.  Most likely her fatigue is related to sleep deprivation and a busy lifestyle, but patient is supposed to schedule a routine physical exam later this year. Discussed with patient that when we perform routine labs to screen for DM, etc, we can also check TSH again.   - Encouraged patient to get at least 8 hours of sleep each night  - Encouraged use of multi-vitamin for women  - Patient to schedule appointment for annual exam for further diagnostic workup related to fatigue  Right areolar dryness/pruritis: Exam consistent with irritant dermatitis.    -Will prescribe 2 week course of hydrocortisone ointment.    - Patient advised to return to clinic if rash/dryness is not improved after 2 weeks of hydrocortisone.  May consider biopsy if issue does not resolve with treatment.  Follow-up: Patient will schedule appointment for annual check-up to further explore fatigue complaint.  Patient to return to clinic if right areolar rash/itching is not improved after 2 week course of hydrocortisone ointment.    Nelly Rout, NP [facility]

## 2015-11-03 NOTE — Patient Instructions (Signed)
1. Schedule appointment for physical so we can get blood work once 2. Swelling in legs is chronic and best treatment is to continue wearing compression stockings. Keep legs elevated 3. I sent in steroid ointment for breast. Only use for 2 weeks. Return if not improved.  4. Take a multivitamin to get proper vitamins and nutrients. Try and get more rest at least 8 hrs a night to see if fatigue improved. Again will do blood work on next visit.    Fatigue Fatigue is feeling tired all of the time, a lack of energy, or a lack of motivation. Occasional or mild fatigue is often a normal response to activity or life in general. However, long-lasting (chronic) or extreme fatigue may indicate an underlying medical condition. HOME CARE INSTRUCTIONS  Watch your fatigue for any changes. The following actions may help to lessen any discomfort you are feeling:  Talk to your health care provider about how much sleep you need each night. Try to get the required amount every night.  Take medicines only as directed by your health care provider.  Eat a healthy and nutritious diet. Ask your health care provider if you need help changing your diet.  Drink enough fluid to keep your urine clear or pale yellow.  Practice ways of relaxing, such as yoga, meditation, massage therapy, or acupuncture.  Exercise regularly.   Change situations that cause you stress. Try to keep your work and personal routine reasonable.  Do not abuse illegal drugs.  Limit alcohol intake to no more than 1 drink per day for nonpregnant women and 2 drinks per day for men. One drink equals 12 ounces of beer, 5 ounces of wine, or 1 ounces of hard liquor.  Take a multivitamin, if directed by your health care provider. SEEK MEDICAL CARE IF:   Your fatigue does not get better.  You have a fever.   You have unintentional weight loss or gain.  You have headaches.   You have difficulty:   Falling asleep.  Sleeping throughout  the night.  You feel angry, guilty, anxious, or sad.   You are unable to have a bowel movement (constipation).   You skin is dry.   Your legs or another part of your body is swollen.  SEEK IMMEDIATE MEDICAL CARE IF:   You feel confused.   Your vision is blurry.  You feel faint or pass out.   You have a severe headache.   You have severe abdominal, pelvic, or back pain.   You have chest pain, shortness of breath, or an irregular or fast heartbeat.   You are unable to urinate or you urinate less than normal.   You develop abnormal bleeding, such as bleeding from the rectum, vagina, nose, lungs, or nipples.  You vomit blood.   You have thoughts about harming yourself or committing suicide.   You are worried that you might harm someone else.    This information is not intended to replace advice given to you by your health care provider. Make sure you discuss any questions you have with your health care provider.   Document Released: 09/29/2007 Document Revised: 12/23/2014 Document Reviewed: 04/05/2014 Elsevier Interactive Patient Education [employer]2016 Elsevier Inc.

## 2015-11-05 ENCOUNTER — Emergency Department (HOSPITAL_COMMUNITY)
Admission: EM | Admit: 2015-11-05 | Discharge: 2015-11-05 | Disposition: A | Discharge status: Home / Self Care | Payer: 59 | Attending: Emergency Medicine | Admitting: Emergency Medicine

## 2015-11-05 ENCOUNTER — Other Ambulatory Visit: Payer: Self-pay

## 2015-11-05 ENCOUNTER — Emergency Department (HOSPITAL_COMMUNITY): Payer: 59

## 2015-11-05 ENCOUNTER — Encounter (HOSPITAL_COMMUNITY): Payer: Self-pay | Admitting: Emergency Medicine

## 2015-11-05 DIAGNOSIS — Z8742 Personal history of other diseases of the female genital tract: Secondary | ICD-10-CM | POA: Insufficient documentation

## 2015-11-05 DIAGNOSIS — T398X5A Adverse effect of other nonopioid analgesics and antipyretics, not elsewhere classified, initial encounter: Secondary | ICD-10-CM | POA: Insufficient documentation

## 2015-11-05 DIAGNOSIS — T43015A Adverse effect of tricyclic antidepressants, initial encounter: Secondary | ICD-10-CM | POA: Diagnosis not present

## 2015-11-05 DIAGNOSIS — R079 Chest pain, unspecified: Secondary | ICD-10-CM | POA: Diagnosis not present

## 2015-11-05 DIAGNOSIS — Z872 Personal history of diseases of the skin and subcutaneous tissue: Secondary | ICD-10-CM | POA: Diagnosis not present

## 2015-11-05 DIAGNOSIS — E119 Type 2 diabetes mellitus without complications: Secondary | ICD-10-CM | POA: Insufficient documentation

## 2015-11-05 DIAGNOSIS — Z8719 Personal history of other diseases of the digestive system: Secondary | ICD-10-CM | POA: Diagnosis not present

## 2015-11-05 DIAGNOSIS — T50905A Adverse effect of unspecified drugs, medicaments and biological substances, initial encounter: Secondary | ICD-10-CM

## 2015-11-05 DIAGNOSIS — Z8632 Personal history of gestational diabetes: Secondary | ICD-10-CM | POA: Diagnosis not present

## 2015-11-05 DIAGNOSIS — I872 Venous insufficiency (chronic) (peripheral): Secondary | ICD-10-CM | POA: Insufficient documentation

## 2015-11-05 DIAGNOSIS — R51 Headache: Secondary | ICD-10-CM | POA: Diagnosis present

## 2015-11-05 DIAGNOSIS — Z862 Personal history of diseases of the blood and blood-forming organs and certain disorders involving the immune mechanism: Secondary | ICD-10-CM | POA: Diagnosis not present

## 2015-11-05 DIAGNOSIS — Z79899 Other long term (current) drug therapy: Secondary | ICD-10-CM | POA: Insufficient documentation

## 2015-11-05 DIAGNOSIS — G43909 Migraine, unspecified, not intractable, without status migrainosus: Secondary | ICD-10-CM | POA: Insufficient documentation

## 2015-11-05 LAB — CBC
HCT: 38.2 % (ref 36.0–46.0)
Hemoglobin: 11.9 g/dL — ABNORMAL LOW (ref 12.0–15.0)
MCH: 23.9 pg — AB (ref 26.0–34.0)
MCHC: 31.2 g/dL (ref 30.0–36.0)
MCV: 76.7 fL — AB (ref 78.0–100.0)
PLATELETS: 285 10*3/uL (ref 150–400)
RBC: 4.98 MIL/uL (ref 3.87–5.11)
RDW: 15.4 % (ref 11.5–15.5)
WBC: 7.1 10*3/uL (ref 4.0–10.5)

## 2015-11-05 LAB — I-STAT TROPONIN, ED: TROPONIN I, POC: 0.01 ng/mL (ref 0.00–0.08)

## 2015-11-05 LAB — BASIC METABOLIC PANEL
Anion gap: 8 (ref 5–15)
BUN: 7 mg/dL (ref 6–20)
CALCIUM: 9.2 mg/dL (ref 8.9–10.3)
CHLORIDE: 106 mmol/L (ref 101–111)
CO2: 24 mmol/L (ref 22–32)
CREATININE: 0.43 mg/dL — AB (ref 0.44–1.00)
GFR calc non Af Amer: >60 mL/min (ref >60)
GLUCOSE: 90 mg/dL (ref 65–99)
Potassium: 3.9 mmol/L (ref 3.5–5.1)
Sodium: 138 mmol/L (ref 135–145)

## 2015-11-05 MED ORDER — METHYLPREDNISOLONE SODIUM SUCC 125 MG IJ SOLR
125.0000 mg | Freq: Once | INTRAMUSCULAR | Status: AC
  Administered 2015-11-05: 125 mg via INTRAVENOUS
  Filled 2015-11-05: qty 2

## 2015-11-05 MED ORDER — DIPHENHYDRAMINE HCL 50 MG/ML IJ SOLN
25.0000 mg | Freq: Once | INTRAMUSCULAR | Status: AC
  Administered 2015-11-05: 25 mg via INTRAVENOUS
  Filled 2015-11-05: qty 1

## 2015-11-05 MED ORDER — SODIUM CHLORIDE 0.9 % IV BOLUS (SEPSIS)
1000.0000 mL | Freq: Once | INTRAVENOUS | Status: AC
  Administered 2015-11-05: 1000 mL via INTRAVENOUS

## 2015-11-05 MED ORDER — PREDNISONE 20 MG PO TABS: ORAL_TABLET | ORAL | Status: DC

## 2015-11-05 MED ORDER — MORPHINE SULFATE (PF) 4 MG/ML IV SOLN
4.0000 mg | Freq: Once | INTRAVENOUS | Status: AC
  Administered 2015-11-05: 4 mg via INTRAVENOUS
  Filled 2015-11-05: qty 1

## 2015-11-05 NOTE — ED Notes (Signed)
Pt stable, ambulatory, states understanding of discharge instructions 

## 2015-11-05 NOTE — Discharge Instructions (Signed)
Take prednisone as prescribed.   Stop relpax.   Take benadryl 25 mg every 6 hrs if you have itchiness, throat pain.   See your doctor and neurologist.   Return to ER if you have worse shortness of breath, chest pain, headaches, neck pain, vomiting, fevers.

## 2015-11-05 NOTE — ED Notes (Signed)
C/o headache since 6:45pm.  Took Amitriptyline and Relpax for headache.  1 hour later started having pain in center of chest that radiates to bilateral arms/shoulders and into neck/jaw.  Also reports sob, nausea, and vomited x 1.  Similar symptoms 2 other times after taking meds but this time it is worse.  PCP told her when it happened again to come to ED.

## 2015-11-05 NOTE — ED Provider Notes (Signed)
CSN: [id number]     Arrival date & time 11/05/15  2041 History   First MD Initiated Contact with Patient 11/05/15 2055     Chief Complaint  Patient presents with  . Chest Pain  . Headache     (Consider location/radiation/quality/duration/timing/severity/associated sxs/prior Treatment) The history is provided by the patient.  Susan Hopkins is a 44 y.o. female hx of gestational DM, GERD, migraines here with chest pain, neck pain, jaw pain. Around 4:30 PM, she had an onset of headache and took a dose of amitriptyline and relpax. She lives started having some low neck tingling as well as jaw pain and then chest pain. Also felt some shortness of breath and nausea and vomiting. Her headache hasn't gone away and took another dose about 7:30 PM and her headache improves but she feels like she has some trouble swallowing and has worsening shortness of breath. She states that she had previous adverse reaction to Imitrex before. She was prescribed relpax by her neurologist.    Past Medical History  Diagnosis Date  . ADVANCED MATERNAL AGE 88/02/2008  . DIABETES MELLITUS, GESTATIONAL, HX OF 05/18/2008  . 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 Contact dermatitis 08/27/2011  . 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 Migraine headache     last migraine in Sept 2012  . GERD (gastroesophageal reflux disease)     doesn't take anything for acid reflux  . Gastric ulcer     history of  . Hemorrhoids   . Constipation   . Diarrhea   . ANEMIA, IRON DEFICIENCY, UNSPEC. 02/12/2007    after IUD  . Diabetes mellitus without complication (HCC)     gestational diab. currently & with infant #2  . Gestational diabetes A2/B 07/20/2012    Fetal echo-nml-under media tab, needs baseline labs-nml with nml TSH and 24 hour urine-68mg . To see optho in Nov 2013-Per pt. Ok, serial u/s for growth, 2x/wk testing at 32 wks.    Past Surgical History  Procedure Laterality Date  .  Cesarean section  2005/2010  . Female circumcision  87  . Tonsillectomy  1989  . Dilation and curettage of uterus  2009  . Cholecystectomy  11/28/2011    Procedure: LAPAROSCOPIC CHOLECYSTECTOMY WITH INTRAOPERATIVE CHOLANGIOGRAM;  Surgeon: Wilmon Arms. Corliss Skains, MD;  Location: [facility] OR;  Service: General;  Laterality: N/A;  . Cesarean section N/A 01/22/2013    Procedure: CESAREAN SECTION;  Surgeon: Reva Bores, MD;  Location: [facility] ORS;  Service: Obstetrics;  Laterality: N/A;  Repeat  . Cesarean section  2005  . Cesarean section  2010   Family History  Problem Relation Age of Onset  . Diabetes Mother   . Hypertension Mother   . Hyperlipidemia Mother   . Hypertension Father   . Hyperlipidemia Father   . Anesthesia problems Neg Hx   . Hypotension Neg Hx   . Malignant hyperthermia Neg Hx   . Pseudochol deficiency Neg Hx    Social History  Substance Use Topics  . Smoking status: Never Smoker   . Smokeless tobacco: Never Used  . Alcohol Use: No   OB History    Gravida Para Term Preterm AB TAB SAB Ectopic Multiple Living   Review of Systems  Cardiovascular: Positive for chest pain.  Neurological: Positive for headaches.  All other systems reviewed and are negative.     Allergies  Oxycodone  Home Medications  Prior to Admission medications   Medication Sig Start Date End Date Taking? Authorizing Provider  acetaminophen (TYLENOL) 325 MG tablet Take 650 mg by mouth every 6 (six) hours as needed for moderate pain.   Yes Historical Provider, MD  cyclobenzaprine (FLEXERIL) 10 MG tablet Take 1 tablet (10 mg total) by mouth 3 (three) times daily as needed for muscle spasms. 08/22/15  Yes Gilda Creasehristopher J Pollina, MD  diclofenac (VOLTAREN) 25 MG EC tablet Take 25 mg by mouth daily as needed for mild pain.   Yes Historical Provider, MD  Multiple Vitamins-Minerals (MULTIVITAMIN WITH MINERALS) tablet Take 1 tablet by mouth daily.   Yes Historical Provider, MD  hydrocortisone  1 % ointment Apply 1 application topically 2 (two) times daily. Patient not taking: Reported on 11/05/2015 11/03/15   Pincus LargeJazma Y Phelps, DO  ibuprofen (ADVIL,MOTRIN) 800 MG tablet Take 1 tablet (800 mg total) by mouth 3 (three) times daily. Patient not taking: Reported on 11/05/2015 08/22/15   Gilda Creasehristopher J Pollina, MD   BP 142/77 mmHg  Pulse 82  Temp(Src) 98.2 F (36.8 C) (Oral)  Resp 14  SpO2 98% Physical Exam  Constitutional: She is oriented to person, place, and time. She appears well-developed and well-nourished.  HENT:  Head: Normocephalic.  Mouth/Throat: Oropharynx is clear and moist.  Posterior pharynx slightly swollen.   Eyes: Conjunctivae are normal. Pupils are equal, round, and reactive to light.  Neck: Normal range of motion. Neck supple.  No stridor   Cardiovascular: Normal rate, regular rhythm and normal heart sounds.   Pulmonary/Chest: Effort normal and breath sounds normal. No respiratory distress. She has no wheezes. She has no rales.  Abdominal: Soft. Bowel sounds are normal. She exhibits no distension. There is no tenderness. There is no rebound.  Musculoskeletal: Normal range of motion.  Neurological: She is alert and oriented to person, place, and time. No cranial nerve deficit. Coordination normal.  Skin: Skin is warm and dry.  Psychiatric: She has a normal mood and affect. Her behavior is normal. Judgment and thought content normal.  Nursing note and vitals reviewed.   ED Course  Procedures (including critical care time) Labs Review Labs Reviewed  BASIC METABOLIC PANEL - Abnormal; Notable for the following:    Creatinine, Ser 0.43 (*)    All other components within normal limits  CBC - Abnormal; Notable for the following:    Hemoglobin 11.9 (*)    MCV 76.7 (*)    MCH 23.9 (*)    All other components within normal limits  I-STAT TROPOININ, ED    Imaging Review Dg Chest 2 View  11/05/2015  CLINICAL DATA:  Chest pain radiating to the shoulders. Shortness  of breath. Symptoms occur after taking migraine medication. EXAM: CHEST  2 VIEW COMPARISON:  08/22/2015 FINDINGS: The heart size and mediastinal contours are within normal limits. Both lungs are clear. The visualized skeletal structures are unremarkable. IMPRESSION: No active cardiopulmonary disease. Electronically Signed   By: Burman NievesWilliam  Stevens M.D.   On: 11/05/2015 21:20   I have personally reviewed and evaluated these images and lab results as part of my medical decision-making.   EKG Interpretation   Date/Time:  Sunday November 05 2015 20:53:29 EST Ventricular Rate:  90 PR Interval:  158 QRS Duration: 92 QT Interval:  344 QTC Calculation: 420 R Axis:   -15 Text Interpretation:  Normal sinus rhythm Normal ECG No previous ECGs  available Confirmed by Sanari Offner  MD, Cylus Douville ([id number]) on 11/05/2015 9:51:35 PM  MDM   Final diagnoses:  None   Elishia Kaczorowski is a 44 y.o. female here with chest pain, shortness of breath, neck pain after taking relpax. I think likely allergic reaction. No rash, still protecting airway. Will give benadryl, steroids. I doubt ACS.   11:24 PM Chest pain resolved. Her throat felt better. Headache improved. Ambulated in the ED, nonfocal exam. Headache typical of her migraine and will not need further workup in the ED. Will stop relpax for now and have her see neurology outpatient. Will dc home with steroids, benadryl given concern for possible allergic reaction.      Richardean Canal, MD 11/05/15 2325

## 2015-11-08 ENCOUNTER — Ambulatory Visit (INDEPENDENT_AMBULATORY_CARE_PROVIDER_SITE_OTHER): Payer: 59 | Admitting: Family Medicine

## 2015-11-08 ENCOUNTER — Encounter: Payer: Self-pay | Admitting: Family Medicine

## 2015-11-08 VITALS — BP 155/87 | HR 85 | Temp 98.0°F | Ht 66.0 in | Wt 184.6 lb

## 2015-11-08 DIAGNOSIS — R454 Irritability and anger: Secondary | ICD-10-CM

## 2015-11-08 DIAGNOSIS — G43909 Migraine, unspecified, not intractable, without status migrainosus: Secondary | ICD-10-CM | POA: Diagnosis not present

## 2015-11-08 NOTE — Progress Notes (Signed)
   Subjective:    Patient ID: Susan Hopkins, female    DOB: Jan 18, 1971, 44 y.o.   MRN: [id number]  Seen for Same day visit for   CC: Irritability  Irritability: Patient was having a migraine on the 20th and had taken her antimigraine medication She started having some swelling of her throat and went to the emergency department. They diagnosed her with a side effect to her Relpax and started her on prednisone. Since starting the prednisone she has felt jittery, anxious, increased appetite, and having trouble sleeping. She has never been on prednisone before.  Migraines. [facility] She was last seen by them in March 2016  Review of Systems   See HPI for ROS. Objective:  BP 155/87 mmHg  Pulse 85  Temp(Src) 98 F (36.7 C) (Oral)  Ht 5\' 6"  (1.676 m)  Wt 184 lb 9.6 oz (83.734 kg)  BMI 29.81 kg/m2  General: NAD HEENT: Moist mucous membranes, uvula midline, extraocular movements intact, pupils equal and reactive Cardiac: RRR, normal heart sounds, no murmurs. 2+ radial and PT pulses bilaterally Respiratory: CTAB, normal effort Extremities:  WWP. Skin: warm and dry, no rashes noted Neuro: alert and oriented, no focal deficits     Assessment & Plan:   Irritability She is most likely feeling the side effects of prednisone She has only taken prednisone for 2 days - Advised that she stop this medication - Informed that she needs to follow-up if her symptoms worsen or change  Migraine She has been evaluated by [facility] already - [facility]

## 2015-11-08 NOTE — Assessment & Plan Note (Signed)
[facility] - [facility]

## 2015-11-08 NOTE — Assessment & Plan Note (Signed)
She is most likely feeling the side effects of prednisone She has only taken prednisone for 2 days - Advised that she stop this medication - Informed that she needs to follow-up if her symptoms worsen or change

## 2015-11-08 NOTE — Patient Instructions (Signed)
Thank you for coming in,    You can stop prednisone.   Sign up for My Chart to have easy access to your labs results, and communication with your Primary care physician   Please feel free to call with any questions or concerns at any time, at [phone]. --Dr. Jordan LikesSchmitz Prednisone tablets What is this medicine? PREDNISONE (PRED ni sone) is a corticosteroid. It is commonly used to treat inflammation of the skin, joints, lungs, and other organs. Common conditions treated include asthma, allergies, and arthritis. It is also used for other conditions, such as blood disorders and diseases of the adrenal glands. This medicine may be used for other purposes; ask your health care provider or pharmacist if you have questions. What should I tell my health care provider before I take this medicine? They need to know if you have any of these conditions: -Cushing's syndrome -diabetes -glaucoma -heart disease -high blood pressure -infection (especially a virus infection such as chickenpox, cold sores, or herpes) -kidney disease -liver disease -mental illness -myasthenia gravis -osteoporosis -seizures -stomach or intestine problems -thyroid disease -an unusual or allergic reaction to lactose, prednisone, other medicines, foods, dyes, or preservatives -pregnant or trying to get pregnant -breast-feeding How should I use this medicine? Take this medicine by mouth with a glass of water. Follow the directions on the prescription label. Take this medicine with food. If you are taking this medicine once a day, take it in the morning. Do not take more medicine than you are told to take. Do not suddenly stop taking your medicine because you may develop a severe reaction. Your doctor will tell you how much medicine to take. If your doctor wants you to stop the medicine, the dose may be slowly lowered over time to avoid any side effects. Talk to your pediatrician regarding the use of this medicine in children.  Special care may be needed. Overdosage: If you think you have taken too much of this medicine contact a poison control center or emergency room at once. NOTE: This medicine is only for you. Do not share this medicine with others. What if I miss a dose? If you miss a dose, take it as soon as you can. If it is almost time for your next dose, talk to your doctor or health care professional. You may need to miss a dose or take an extra dose. Do not take double or extra doses without advice. What may interact with this medicine? Do not take this medicine with any of the following medications: -metyrapone -mifepristone This medicine may also interact with the following medications: -aminoglutethimide -amphotericin B -aspirin and aspirin-like medicines -barbiturates -certain medicines for diabetes, like glipizide or glyburide -cholestyramine -cholinesterase inhibitors -cyclosporine -digoxin -diuretics -ephedrine -female hormones, like estrogens and birth control pills -isoniazid -ketoconazole -NSAIDS, medicines for pain and inflammation, like ibuprofen or naproxen -phenytoin -rifampin -toxoids -vaccines -warfarin This list may not describe all possible interactions. Give your health care provider a list of all the medicines, herbs, non-prescription drugs, or dietary supplements you use. Also tell them if you smoke, drink alcohol, or use illegal drugs. Some items may interact with your medicine. What should I watch for while using this medicine? Visit your doctor or health care professional for regular checks on your progress. If you are taking this medicine over a prolonged period, carry an identification card with your name and address, the type and dose of your medicine, and your doctor's name and address. This medicine may increase your risk of getting  an infection. Tell your doctor or health care professional if you are around anyone with measles or chickenpox, or if you develop sores  or blisters that do not heal properly. If you are going to have surgery, tell your doctor or health care professional that you have taken this medicine within the last twelve months. Ask your doctor or health care professional about your diet. You may need to lower the amount of salt you eat. This medicine may affect blood sugar levels. If you have diabetes, check with your doctor or health care professional before you change your diet or the dose of your diabetic medicine. What side effects may I notice from receiving this medicine? Side effects that you should report to your doctor or health care professional as soon as possible: -allergic reactions like skin rash, itching or hives, swelling of the face, lips, or tongue -changes in emotions or moods -changes in vision -depressed mood -eye pain -fever or chills, cough, sore throat, pain or difficulty passing urine -increased thirst -swelling of ankles, feet Side effects that usually do not require medical attention (report to your doctor or health care professional if they continue or are bothersome): -confusion, excitement, restlessness -headache -nausea, vomiting -skin problems, acne, thin and shiny skin -trouble sleeping -weight gain This list may not describe all possible side effects. Call your doctor for medical advice about side effects. You may report side effects to FDA at 1-800-FDA-1088. Where should I keep my medicine? Keep out of the reach of children. Store at room temperature between 15 and 30 degrees C (59 and 86 degrees F). Protect from light. Keep container tightly closed. Throw away any unused medicine after the expiration date. NOTE: This sheet is a summary. It may not cover all possible information. If you have questions about this medicine, talk to your doctor, pharmacist, or health care provider.    2016, Elsevier/[employer] Standard. (2011-07-18 10:57:14)

## 2015-11-10 ENCOUNTER — Telehealth: Payer: Self-pay | Admitting: Student

## 2015-11-10 NOTE — Telephone Encounter (Signed)
Pt called regarding migraine headache. Her headache is improving but she reports persistent low level headache somewhat improved with tylenol, low level nausea but no emesis, diarrhea, fevers/chills, dizziness or fainting. Symptoms potentially related to cold symptom versus withdrawl of steroid treatment. Pt will call if further issues arise. Advised to continue tylenol as needed

## 2015-11-14 ENCOUNTER — Encounter: Payer: Self-pay | Admitting: Student

## 2015-11-14 ENCOUNTER — Ambulatory Visit (INDEPENDENT_AMBULATORY_CARE_PROVIDER_SITE_OTHER): Payer: 59 | Admitting: Student

## 2015-11-14 VITALS — BP 137/63 | HR 80 | Temp 97.9°F

## 2015-11-14 DIAGNOSIS — G43109 Migraine with aura, not intractable, without status migrainosus: Secondary | ICD-10-CM | POA: Diagnosis not present

## 2015-11-14 DIAGNOSIS — R51 Headache: Secondary | ICD-10-CM

## 2015-11-14 DIAGNOSIS — R519 Headache, unspecified: Secondary | ICD-10-CM

## 2015-11-14 MED ORDER — KETOROLAC TROMETHAMINE 30 MG/ML IJ SOLN
30.0000 mg | Freq: Once | INTRAMUSCULAR | Status: AC
  Administered 2015-11-14: 30 mg via INTRAMUSCULAR

## 2015-11-14 MED ORDER — PROMETHAZINE HCL 25 MG PO TABS: 12.5000 mg | ORAL_TABLET | Freq: Four times a day (QID) | ORAL | Status: DC | PRN

## 2015-11-14 MED ORDER — PROMETHAZINE HCL 25 MG PO TABS: 25.0000 mg | ORAL_TABLET | Freq: Four times a day (QID) | ORAL | Status: DC | PRN

## 2015-11-14 MED ORDER — DIPHENHYDRAMINE HCL 25 MG PO CAPS: 25.0000 mg | ORAL_CAPSULE | ORAL | Status: DC | PRN

## 2015-11-14 NOTE — Patient Instructions (Signed)
It was great seeing you today! We have addressed the following issues today  1. Headache: gave you ketorolac injection which is a pain medication. I also like to take benadryl and Phenegran at home. Those medication can make you feel sleepy. Please avoid driving or operating machinery when you take those medications. If you don't see improvement is three to four days, please return to clinic.    If we did any lab work today, and the results require attention, either me or my nurse will get in touch with you. If everything is normal, you will get a letter in mail. If you don't hear from us in two weeks, please give us a call. Otherwise, I look forward to talking with you again at our next visit. If you have any questions or concerns before then, please call the clinic at [phone].  Please bring all your medications to every doctors visit   Sign up for My Chart to have easy access to your labs results, and communication with your Primary care physician.    Please check-out at the front desk before leaving the clinic.   Take Care,

## 2015-11-14 NOTE — Assessment & Plan Note (Addendum)
Migraine with aura for 10 days. Severe reaction to Replax. Didn't tolerate prednisone either. Responded to migraine cocktail in the past. Doesn't know if she has ever gotten ketorolac. -Toradol 30 mg IM once -Promethazine 25 mg four times a day prn -Benadryl 25 mg four times a day prn -Warned that these medication may cause drowsiness and sedation. -Return precautions given

## 2015-11-14 NOTE — Progress Notes (Signed)
   Subjective:    Patient ID: Susan Hopkins, female    DOB: [birth year], 44 y.o.   MRN: [id number]  HPI Headache: with aura  (black spot for 15 to 30 minutes). Pain on the right side. Pain throbbing. Pain 8-9. Pain lasts about several hours (7-8 hrs). Pain used to be once a year. However, this is the third this year. Worse with light and noise. Shots and morphine helped in the past. Denies weakness except when she took Relpax. Had some fever and chills but didn't measure them. Gained weight after prednisone.  Took Relpax 10 days ago. She takes it whenever she has an attack. However, she developed chest tightness and tongue swelling, difficulty swallowing, pain in her shoulder and neck muscle. She used Relpax for about 1 year. She was on Imitrex before that but stopped due to generalized pain. Went to ED and was prescribed Prednisone. She felt hungry and shaking. She couldn't sleep, palpitation, nausea and vomiting.  She also has three shots (Bendryl and can't remember the other two).  Review of Systems Per HPI    Objective:   Physical Exam Gen: appears in pain Eyes: PERRLA, sclera anicteric,  Nares: clear Neuro: CN ii-xii within normal limit, motor 5/5 in UEs and LEs, sensation intact Assessment & Plan:  Migraine Migraine with aura for 10 days. Severe reaction to Replax. Didn't tolerate prednisone either. Responded to migraine cocktail in the past. Doesn't know if she has ever gotten ketorolac. -Toradol 30 mg IM once -Promethazine 25 mg four times a day prn -Benadryl 25 mg four times a day prn -Warned that these medication may cause drowsiness and sedation. -Return precautions given

## 2015-11-20 ENCOUNTER — Encounter: Payer: Self-pay | Admitting: Obstetrics and Gynecology

## 2015-11-20 ENCOUNTER — Ambulatory Visit (INDEPENDENT_AMBULATORY_CARE_PROVIDER_SITE_OTHER): Payer: 59 | Admitting: Obstetrics and Gynecology

## 2015-11-20 VITALS — BP 152/88 | HR 92 | Temp 98.4°F | Ht 66.0 in | Wt 185.2 lb

## 2015-11-20 DIAGNOSIS — D649 Anemia, unspecified: Secondary | ICD-10-CM

## 2015-11-20 DIAGNOSIS — Z0000 Encounter for general adult medical examination without abnormal findings: Secondary | ICD-10-CM | POA: Diagnosis not present

## 2015-11-20 DIAGNOSIS — R739 Hyperglycemia, unspecified: Secondary | ICD-10-CM | POA: Diagnosis not present

## 2015-11-20 DIAGNOSIS — R5382 Chronic fatigue, unspecified: Secondary | ICD-10-CM | POA: Diagnosis not present

## 2015-11-20 LAB — IRON AND TIBC
%SAT: 12 % (ref 11–50)
Iron: 32 ug/dL — ABNORMAL LOW (ref 40–190)
TIBC: 273 ug/dL (ref 250–450)
UIBC: 241 ug/dL (ref 125–400)

## 2015-11-20 LAB — TSH: TSH: 2.242 u[IU]/mL (ref 0.350–4.500)

## 2015-11-20 LAB — POCT GLYCOSYLATED HEMOGLOBIN (HGB A1C): Hemoglobin A1C: 6.1

## 2015-11-20 NOTE — Patient Instructions (Addendum)
Labs ordered and results will be sent to you Take multivitamin Seek immediate help if chest pain, shortness of breath, heart palpations   Health Maintenance, Female Adopting a healthy lifestyle and getting preventive care can go a long way to promote health and wellness. Talk with your health care provider about what schedule of regular examinations is right for you. This is a good chance for you to check in with your provider about disease prevention and staying healthy. In between checkups, there are plenty of things you can do on your own. Experts have done a lot of research about which lifestyle changes and preventive measures are most likely to keep you healthy. Ask your health care provider for more information. WEIGHT AND DIET  Eat a healthy diet  Be sure to include plenty of vegetables, fruits, low-fat dairy products, and lean protein.  Do not eat a lot of foods high in solid fats, added sugars, or salt.  Get regular exercise. This is one of the most important things you can do for your health.  Most adults should exercise for at least 150 minutes each week. The exercise should increase your heart rate and make you sweat (moderate-intensity exercise).  Most adults should also do strengthening exercises at least twice a week. This is in addition to the moderate-intensity exercise.  Maintain a healthy weight  Body mass index (BMI) is a measurement that can be used to identify possible weight problems. It estimates body fat based on height and weight. Your health care provider can help determine your BMI and help you achieve or maintain a healthy weight.  For females 79 years of age and older:   A BMI below 18.5 is considered underweight.  A BMI of 18.5 to 24.9 is normal.  A BMI of 25 to 29.9 is considered overweight.  A BMI of 30 and above is considered obese.  Watch levels of cholesterol and blood lipids  You should start having your blood tested for lipids and  cholesterol at 46 years of age, then have this test every 5 years.  You may need to have your cholesterol levels checked more often if:  Your lipid or cholesterol levels are high.  You are older than 46 years of age.  You are at high risk for heart disease.  CANCER SCREENING   Lung Cancer  Lung cancer screening is recommended for adults 1-37 years old who are at high risk for lung cancer because of a history of smoking.  A yearly low-dose CT scan of the lungs is recommended for people who:  Currently smoke.  Have quit within the past 15 years.  Have at least a 30-pack-year history of smoking. A pack year is smoking an average of one pack of cigarettes a day for 1 year.  Yearly screening should continue until it has been 15 years since you quit.  Yearly screening should stop if you develop a health problem that would prevent you from having lung cancer treatment.  Breast Cancer  Practice breast self-awareness. This means understanding how your breasts normally appear and feel.  It also means doing regular breast self-exams. Let your health care provider know about any changes, no matter how small.  If you are in your 20s or 30s, you should have a clinical breast exam (CBE) by a health care provider every 1-3 years as part of a regular health exam.  If you are 7 or older, have a CBE every year. Also consider having a breast X-ray (mammogram)  every year.  If you have a family history of breast cancer, talk to your health care provider about genetic screening.  If you are at high risk for breast cancer, talk to your health care provider about having an MRI and a mammogram every year.  Breast cancer gene (BRCA) assessment is recommended for women who have family members with BRCA-related cancers. BRCA-related cancers include:  Breast.  Ovarian.  Tubal.  Peritoneal cancers.  Results of the assessment will determine the need for genetic counseling and BRCA1 and BRCA2  testing. Cervical Cancer Your health care provider may recommend that you be screened regularly for cancer of the pelvic organs (ovaries, uterus, and vagina). This screening involves a pelvic examination, including checking for microscopic changes to the surface of your cervix (Pap test). You may be encouraged to have this screening done every 3 years, beginning at age 51.  For women ages 54-65, health care providers may recommend pelvic exams and Pap testing every 3 years, or they may recommend the Pap and pelvic exam, combined with testing for human papilloma virus (HPV), every 5 years. Some types of HPV increase your risk of cervical cancer. Testing for HPV may also be done on women of any age with unclear Pap test results.  Other health care providers may not recommend any screening for nonpregnant women who are considered low risk for pelvic cancer and who do not have symptoms. Ask your health care provider if a screening pelvic exam is right for you.  If you have had past treatment for cervical cancer or a condition that could lead to cancer, you need Pap tests and screening for cancer for at least 20 years after your treatment. If Pap tests have been discontinued, your risk factors (such as having a new sexual partner) need to be reassessed to determine if screening should resume. Some women have medical problems that increase the chance of getting cervical cancer. In these cases, your health care provider may recommend more frequent screening and Pap tests. Colorectal Cancer  This type of cancer can be detected and often prevented.  Routine colorectal cancer screening usually begins at 46 years of age and continues through 46 years of age.  Your health care provider may recommend screening at an earlier age if you have risk factors for colon cancer.  Your health care provider may also recommend using home test kits to check for hidden blood in the stool.  A small camera at the end of a  tube can be used to examine your colon directly (sigmoidoscopy or colonoscopy). This is done to check for the earliest forms of colorectal cancer.  Routine screening usually begins at age 62.  Direct examination of the colon should be repeated every 5-10 years through 46 years of age. However, you may need to be screened more often if early forms of precancerous polyps or small growths are found. Skin Cancer  Check your skin from head to toe regularly.  Tell your health care provider about any new moles or changes in moles, especially if there is a change in a mole's shape or color.  Also tell your health care provider if you have a mole that is larger than the size of a pencil eraser.  Always use sunscreen. Apply sunscreen liberally and repeatedly throughout the day.  Protect yourself by wearing long sleeves, pants, a wide-brimmed hat, and sunglasses whenever you are outside. HEART DISEASE, DIABETES, AND HIGH BLOOD PRESSURE   High blood pressure causes heart disease and  increases the risk of stroke. High blood pressure is more likely to develop in:  People who have blood pressure in the high end of the normal range (130-139/85-89 mm Hg).  People who are overweight or obese.  People who are African American.  If you are 67-62 years of age, have your blood pressure checked every 3-5 years. If you are 72 years of age or older, have your blood pressure checked every year. You should have your blood pressure measured twice--once when you are at a hospital or clinic, and once when you are not at a hospital or clinic. Record the average of the two measurements. To check your blood pressure when you are not at a hospital or clinic, you can use:  An automated blood pressure machine at a pharmacy.  A home blood pressure monitor.  If you are between 59 years and 37 years old, ask your health care provider if you should take aspirin to prevent strokes.  Have regular diabetes screenings. This  involves taking a blood sample to check your fasting blood sugar level.  If you are at a normal weight and have a low risk for diabetes, have this test once every three years after 46 years of age.  If you are overweight and have a high risk for diabetes, consider being tested at a younger age or more often. PREVENTING INFECTION  Hepatitis B  If you have a higher risk for hepatitis B, you should be screened for this virus. You are considered at high risk for hepatitis B if:  You were born in a country where hepatitis B is common. Ask your health care provider which countries are considered high risk.  Your parents were born in a high-risk country, and you have not been immunized against hepatitis B (hepatitis B vaccine).  You have HIV or AIDS.  You use needles to inject street drugs.  You live with someone who has hepatitis B.  You have had sex with someone who has hepatitis B.  You get hemodialysis treatment.  You take certain medicines for conditions, including cancer, organ transplantation, and autoimmune conditions. Hepatitis C  Blood testing is recommended for:  Everyone born from [birth year] through 1967.  Anyone with known risk factors for hepatitis C. Sexually transmitted infections (STIs)  You should be screened for sexually transmitted infections (STIs) including gonorrhea and chlamydia if:  You are sexually active and are younger than 46 years of age.  You are older than 46 years of age and your health care provider tells you that you are at risk for this type of infection.  Your sexual activity has changed since you were last screened and you are at an increased risk for chlamydia or gonorrhea. Ask your health care provider if you are at risk.  If you do not have HIV, but are at risk, it may be recommended that you take a prescription medicine daily to prevent HIV infection. This is called pre-exposure prophylaxis (PrEP). You are considered at risk if:  You are  sexually active and do not regularly use condoms or know the HIV status of your partner(s).  You take drugs by injection.  You are sexually active with a partner who has HIV. Talk with your health care provider about whether you are at high risk of being infected with HIV. If you choose to begin PrEP, you should first be tested for HIV. You should then be tested every 3 months for as long as you are taking PrEP.  PREGNANCY   If you are premenopausal and you may become pregnant, ask your health care provider about preconception counseling.  If you may become pregnant, take 400 to 800 micrograms (mcg) of folic acid every day.  If you want to prevent pregnancy, talk to your health care provider about birth control (contraception). OSTEOPOROSIS AND MENOPAUSE   Osteoporosis is a disease in which the bones lose minerals and strength with aging. This can result in serious bone fractures. Your risk for osteoporosis can be identified using a bone density scan.  If you are 48 years of age or older, or if you are at risk for osteoporosis and fractures, ask your health care provider if you should be screened.  Ask your health care provider whether you should take a calcium or vitamin D supplement to lower your risk for osteoporosis.  Menopause may have certain physical symptoms and risks.  Hormone replacement therapy may reduce some of these symptoms and risks. Talk to your health care provider about whether hormone replacement therapy is right for you.  HOME CARE INSTRUCTIONS   Schedule regular health, dental, and eye exams.  Stay current with your immunizations.   Do not use any tobacco products including cigarettes, chewing tobacco, or electronic cigarettes.  If you are pregnant, do not drink alcohol.  If you are breastfeeding, limit how much and how often you drink alcohol.  Limit alcohol intake to no more than 1 drink per day for nonpregnant women. One drink equals 12 ounces of beer, 5  ounces of wine, or 1 ounces of hard liquor.  Do not use street drugs.  Do not share needles.  Ask your health care provider for help if you need support or information about quitting drugs.  Tell your health care provider if you often feel depressed.  Tell your health care provider if you have ever been abused or do not feel safe at home.   This information is not intended to replace advice given to you by your health care provider. Make sure you discuss any questions you have with your health care provider.   Document Released: 06/17/2011 Document Revised: 12/23/2014 Document Reviewed: 11/03/2013 Elsevier Interactive Patient Education [employer].

## 2015-11-20 NOTE — Progress Notes (Signed)
Subjective: Chief Complaint  Patient presents with  . Annual Exam    HPI: Susan Hopkins is a 44 y.o. presenting to clinic today to discuss the following:  44 y.o. year old female presents for well woman/preventative visit and annual GYN examination.  Acute Concerns: Wants to blood checked because still having fatigue. Also concerned about a Vitamin D deficiency. For religious reasons patient covers up. States that all her friends have deficiencies.   Diet: Cooks all meals, balanced - diet  Exercise: no exercise; not regular. Has gym membership at [employer] but has not used.  Sexual History: Sexually active with husband  Birth Control: IUD placed 2016  POA/Living Will: No  Social:  Social History   Social History  . Marital Status: Married    Spouse Name: Roseanne RenoHassan  . Number of Children: 4  . Years of Education: Ba   Occupational History  . [employer]    Social History Main Topics  . Smoking status: Never Smoker   . Smokeless tobacco: Never Used  . Alcohol Use: No  . Drug Use: No  . Sexual Activity: Yes   Other Topics Concern  . Not on file   Social History Narrative   Lives at home with husband and 4 kids.   Right handed.    Caffeine use: Coffee (2-3 cups/day)   Family History: Colon Cancer in two uncles on father's side.  Immunization:  Tdap/TD: Up to Date  Influenza: Up to Date   Cancer Screening:  Pap Smear: Up to Date; needs to be recollected due to transition zone not being present  Mammogram: Due   ROS reviewed and were negative unless otherwise noted in HPI.  Past Medical, Surgical, Social, and Family History Reviewed & Updated per EMR.   Objective: BP 152/88 mmHg  Pulse 92  Temp(Src) 98.4 F (36.9 C) (Oral)  Ht 5\' 6"  (1.676 m)  Wt 185 lb 3.2 oz (84.006 kg)  BMI 29.91 kg/m2  Physical Exam  Constitutional: She is well-developed, well-nourished, and in no distress.  HENT:  Head: Normocephalic and atraumatic.  Mouth/Throat: Oropharynx is  clear and moist.  Eyes: Conjunctivae and EOM are normal. Pupils are equal, round, and reactive to light.  Neck: Normal range of motion. Neck supple. No thyromegaly present.  Cardiovascular: Normal rate, regular rhythm, normal heart sounds and intact distal pulses.   Pulmonary/Chest: Effort normal and breath sounds normal.  Abdominal: Soft. Bowel sounds are normal. She exhibits no distension. There is no tenderness.  Musculoskeletal: Normal range of motion. She exhibits no edema.  Neurological: She is alert. She has normal sensation, normal strength and intact cranial nerves.  Skin: Skin is warm and dry.  Psychiatric: Mood and affect normal.    Assessment/Plan: 44 y.o. female presents for annual well woman/preventative exam.   1.Health Maintainance: Complete preventative visit performed today. Patient is up to date on everything except for mammogram. Instructed patient to schedule mammogram.   2. Chronic fatigue : Continues to endorse fatigue that is chronic in nature. Will obtain work as below. She does have history of iron deficiency anemia. Last CBC without signs of anemia.  - TSH ordered  - Iron and TIBC ordered   3. Concern for Vitamin D deficiency: Due to patient not being exposed to much sunlight for religious purposes and concern for Vitamin D deficiency. Will obtain labs. Encouraged her to take multivitamin. Could also be contributing to fatigue symptoms. - VITAMIN D 25 Hydroxy (Vit-D Deficiency, Fractures) ordered  3. Blood  glucose elevated: Has been told she is pre-diabetic. Random blood glucose from labs elevated. Will check A1c. - POCT glycosylated hemoglobin (Hb A1C) ordered.   Caryl Ada, DO 11/20/2015, 3:44 PM PGY-2, [facility]

## 2015-11-21 ENCOUNTER — Encounter: Payer: Self-pay | Admitting: Obstetrics and Gynecology

## 2015-11-21 LAB — VITAMIN D 25 HYDROXY (VIT D DEFICIENCY, FRACTURES): VIT D 25 HYDROXY: 19 ng/mL — AB (ref 30–100)

## 2015-11-27 ENCOUNTER — Ambulatory Visit (INDEPENDENT_AMBULATORY_CARE_PROVIDER_SITE_OTHER): Payer: 59 | Admitting: Neurology

## 2015-11-27 ENCOUNTER — Ambulatory Visit (INDEPENDENT_AMBULATORY_CARE_PROVIDER_SITE_OTHER): Payer: 59 | Admitting: Family Medicine

## 2015-11-27 VITALS — BP 153/93 | HR 93 | Temp 98.0°F | Wt 186.0 lb

## 2015-11-27 DIAGNOSIS — G43909 Migraine, unspecified, not intractable, without status migrainosus: Secondary | ICD-10-CM | POA: Diagnosis not present

## 2015-11-27 MED ORDER — PROMETHAZINE HCL 25 MG/ML IJ SOLN
25.0000 mg | Freq: Once | INTRAMUSCULAR | Status: AC
  Administered 2015-11-27: 25 mg via INTRAMUSCULAR

## 2015-11-27 MED ORDER — DEXAMETHASONE SODIUM PHOSPHATE 10 MG/ML IJ SOLN
10.0000 mg | Freq: Once | INTRAMUSCULAR | Status: AC
  Administered 2015-11-27: 10 mg via INTRAMUSCULAR

## 2015-11-27 MED ORDER — KETOROLAC TROMETHAMINE 60 MG/2ML IM SOLN
60.0000 mg | Freq: Once | INTRAMUSCULAR | Status: AC
  Administered 2015-11-27: 60 mg via INTRAMUSCULAR

## 2015-11-27 NOTE — Progress Notes (Signed)
    Subjective:  Susan Hopkins is a 44 y.o. female who presents to the [facility] today for same day appointment with a chief complaint of headache.   HPI:  Headache Patient has a history of migraine headaches. Was seen in the clinic approximately 2 weeks ago for a severe migraine and was given toradol, promethazine, and benadryl. Today, patient first noticed her headache about an hour before presenting to the clinic. Pain is located on the left part of her head and is described as sharp and throbbing. Pain is similar in nature to past migraines. She has scotomas associated with the migraine. She has also had nausea and vomiting for the past hour. No weakness or numbness. No blurred or dim vision.   ROS: Per HPI  Objective:  Physical Exam: BP 153/93 mmHg  Pulse 93  Temp(Src) 98 F (36.7 C)  Wt 186 lb (84.369 kg)  Gen: Uncomfortable appearing, lying on bed HEENT: MMM, EOMI CV: RRR with no murmurs appreciated Lungs: NWOB, CTAB with no crackles, wheezes, or rhonchi Neuro: CN2-12 intact. Finger-nose-finger intact. Strength 5/5 in all fields. Sensation to light touch intact throughout.   Assessment/Plan:  Migraine Patient presents with 1 hour of typical migraine, though severe in nature. Will treat with toradol 60mg  IM, decadron 10mg  IM, and phenergan 25mg  IM. Headache improved in the office with this cocktail. Return precautions reviewed. Patient would likely benefit from prophylactic therapy given frequency of exacerbations, will defer to PCP.   Katina Degreealeb [name], MD [facility] Resident PGY-2 11/27/2015 4:17 PM

## 2015-11-27 NOTE — Assessment & Plan Note (Signed)
Patient presents with 1 hour of typical migraine, though severe in nature. Will treat with toradol 60mg  IM, decadron 10mg  IM, and phenergan 25mg  IM. Headache improved in the office with this cocktail. Return precautions reviewed. Patient would likely benefit from prophylactic therapy given frequency of exacerbations, will defer to PCP.

## 2015-11-27 NOTE — Patient Instructions (Signed)
We will give you a combination of toradol, decadron, and phenergan to try to break your migraine. If you are still having a severe headache after these medications, you should go to the emergency room.   You should talk with your primary doctor about starting a controller medication to help prevent this headaches from occuring.  Take care,  Dr Jimmey RalphParker  Migraine Headache A migraine headache is an intense, throbbing pain on one or both sides of your head. A migraine can last for 30 minutes to several hours. CAUSES  The exact cause of a migraine headache is not always known. However, a migraine may be caused when nerves in the brain become irritated and release chemicals that cause inflammation. This causes pain. Certain things may also trigger migraines, such as:  Alcohol.  Smoking.  Stress.  Menstruation.  Aged cheeses.  Foods or drinks that contain nitrates, glutamate, aspartame, or tyramine.  Lack of sleep.  Chocolate.  Caffeine.  Hunger.  Physical exertion.  Fatigue.  Medicines used to treat chest pain (nitroglycerine), birth control pills, estrogen, and some blood pressure medicines. SIGNS AND SYMPTOMS  Pain on one or both sides of your head.  Pulsating or throbbing pain.  Severe pain that prevents daily activities.  Pain that is aggravated by any physical activity.  Nausea, vomiting, or both.  Dizziness.  Pain with exposure to bright lights, loud noises, or activity.  General sensitivity to bright lights, loud noises, or smells. Before you get a migraine, you may get warning signs that a migraine is coming (aura). An aura may include:  Seeing flashing lights.  Seeing bright spots, halos, or zigzag lines.  Having tunnel vision or blurred vision.  Having feelings of numbness or tingling.  Having trouble talking.  Having muscle weakness. DIAGNOSIS  A migraine headache is often diagnosed based on:  Symptoms.  Physical exam.  A CT scan or MRI  of your head. These imaging tests cannot diagnose migraines, but they can help rule out other causes of headaches. TREATMENT Medicines may be given for pain and nausea. Medicines can also be given to help prevent recurrent migraines.  HOME CARE INSTRUCTIONS  Only take over-the-counter or prescription medicines for pain or discomfort as directed by your health care provider. The use of long-term narcotics is not recommended.  Lie down in a dark, quiet room when you have a migraine.  Keep a journal to find out what may trigger your migraine headaches. For example, write down:  What you eat and drink.  How much sleep you get.  Any change to your diet or medicines.  Limit alcohol consumption.  Quit smoking if you smoke.  Get 7-9 hours of sleep, or as recommended by your health care provider.  Limit stress.  Keep lights dim if bright lights bother you and make your migraines worse. SEEK IMMEDIATE MEDICAL CARE IF:   Your migraine becomes severe.  You have a fever.  You have a stiff neck.  You have vision loss.  You have muscular weakness or loss of muscle control.  You start losing your balance or have trouble walking.  You feel faint or pass out.  You have severe symptoms that are different from your first symptoms. MAKE SURE YOU:   Understand these instructions.  Will watch your condition.  Will get help right away if you are not doing well or get worse.   This information is not intended to replace advice given to you by your health care provider. Make sure you  discuss any questions you have with your health care provider.   Document Released: 12/02/2005 Document Revised: 12/23/2014 Document Reviewed: 08/09/2013 Elsevier Interactive Patient Education [employer].

## 2015-11-28 ENCOUNTER — Encounter: Payer: Self-pay | Admitting: Neurology

## 2015-11-28 ENCOUNTER — Ambulatory Visit (INDEPENDENT_AMBULATORY_CARE_PROVIDER_SITE_OTHER): Payer: 59 | Admitting: Neurology

## 2015-11-28 VITALS — BP 166/90 | HR 110 | Ht 66.0 in | Wt 185.4 lb

## 2015-11-28 DIAGNOSIS — G43919 Migraine, unspecified, intractable, without status migrainosus: Secondary | ICD-10-CM

## 2015-11-28 DIAGNOSIS — G43001 Migraine without aura, not intractable, with status migrainosus: Secondary | ICD-10-CM

## 2015-11-28 DIAGNOSIS — G43701 Chronic migraine without aura, not intractable, with status migrainosus: Secondary | ICD-10-CM

## 2015-11-28 MED ORDER — DICLOFENAC POTASSIUM(MIGRAINE) 50 MG PO PACK: 50.0000 mg | PACK | Freq: Once | ORAL | Status: DC | PRN

## 2015-11-28 MED ORDER — KETOROLAC TROMETHAMINE 60 MG/2ML IM SOLN
60.0000 mg | Freq: Once | INTRAMUSCULAR | Status: AC
  Administered 2015-11-28: 60 mg via INTRAMUSCULAR

## 2015-11-28 MED ORDER — TOPIRAMATE ER 50 MG PO CAP24: 50.0000 mg | ORAL_CAPSULE | Freq: Every day | ORAL | Status: DC

## 2015-11-28 NOTE — Progress Notes (Signed)
Toradol 60mg /mL given in right deltoid. Cleaned w/ alcohol wipe prior to injection. Pt tolerated well. Band-Aid applied.

## 2015-11-28 NOTE — Patient Instructions (Signed)
Overall you are doing fairly well but I do want to suggest a few things today:   Remember to drink plenty of fluid, eat healthy meals and do not skip any meals. Try to eat protein with a every meal and eat a healthy snack such as fruit or nuts in between meals. Try to keep a regular sleep-wake schedule and try to exercise daily, particularly in the form of walking, 20-30 minutes a day, if you can.   As far as your medications are concerned, I would like to suggest:  Start Trokendi at night 25mg  then increase to 50 mg at night Cambia at onset of headache. Can repeat in 2 hours. Take it with the phenergan. With the next migraine, can Susan Hopkins and we will have you back for injections   I would like to see you back in 3 months, sooner if we need to. Please call us with any interim questions, concerns, problems, updates or refill requests.   Please also call us for any test results so we can go over those with you on the phone.  My clinical assistant and will answer any of your questions and relay your messages to me and also relay most of my messages to you.   Our phone number is [phone]. We also have an after hours call service for urgent matters and there is a physician on-call for urgent questions. For any emergencies you know to call 911 or go to the nearest emergency room

## 2015-11-28 NOTE — Progress Notes (Signed)
Faxed cambia enrollment per Dr Lucia GaskinsAhern request to [facility]. Received confirmation. Sent copy to medical records.

## 2015-11-28 NOTE — Progress Notes (Signed)
[facility]    Provider:  Dr Lucia Gaskins Referring Provider: Pincus Large, DO Primary Care Physician:  Caryl Ada, DO  CC: Migraine  Interval history 11/28/2015;   She has worsening headaches. She is crying today, in the last 3 weeks over 8 migraines. Relpax gave her side effects, chest pain and tightness. She started Amitriptyline, she didn't have a headache for a few months. She was having insomnia and amitrip helped a lot but made her too drowy. Sleep and increased caffeine makes her headaches worse. Yesterday she had a very bad migraine. She has had more than 8 migraines in the last 3 weeks.   HPI: Susan Hopkins is a 44 y.o. female here as a referral from Dr. Jimmey Ralph for Migraine  Migraines started 7 years ago. Used to have it 1-2x a year. Was worse with pregnancy. This last month she has pain on the right side of the head continuously. Throbbing. endorses Light sensitivity, sound sensitivity. Has wavy light aura. She has them 2x a month. This month she has had it a at a low level all month, always there. She takes tylenol, ibuprofen daily. She has neck and shoulder muscular pain. Imitrex doesn't work, makes her shoulder sore, hands hurt. She has nausea and vomiting with the headaches. She went back to school this month, that may be exacerbating the headaches. She has a new glasses prescription. She has 4 kids. No more kids, has an IUD. She gets to sleep but everything wakes her up. She can't nap during the day. No snoring. Migraine can be severe. Can be 10/10. 8-10 hours.   Review of Systems: Patient complains of symptoms per HPI as well as the following symptoms: denies CP, SOB. +Leg swelling, restless leg, fatigue Pertinent negatives per HPI. All others negative.   Social History   Social History  . Marital Status: Married    Spouse Name: [name]  . Number of Children: 4  . Years of Education: Ba   Occupational History  . [employer]    Social History Main Topics    . Smoking status: Never Smoker   . Smokeless tobacco: Never Used  . Alcohol Use: No  . Drug Use: No  . Sexual Activity: Yes    Birth Control/ Protection: IUD   Other Topics Concern  . Not on file   Social History Narrative   Lives at home with husband and 4 kids.   Right handed.    Caffeine use: Coffee (2-3 cups/day)    Family History  Problem Relation Age of Onset  . Diabetes Mother   . Hypertension Mother   . Hyperlipidemia Mother   . Hypertension Father   . Hyperlipidemia Father   . Anesthesia problems Neg Hx   . Hypotension Neg Hx   . Malignant hyperthermia Neg Hx   . Pseudochol deficiency Neg Hx     Past Medical History  Diagnosis Date  . ADVANCED MATERNAL AGE 49/02/2008  . DIABETES MELLITUS, GESTATIONAL, HX OF 05/18/2008  . 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 Contact dermatitis 08/27/2011  . 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 Migraine headache     last migraine in Sept 2012  . GERD (gastroesophageal reflux disease)     doesn't take anything for acid reflux  . Gastric ulcer     history of  . Hemorrhoids   . Constipation   . Diarrhea   . ANEMIA, IRON DEFICIENCY, UNSPEC. 02/12/2007    after IUD  .  Diabetes mellitus without complication (HCC)     gestational diab. currently & with infant #2  . Gestational diabetes A2/B 07/20/2012    Fetal echo-nml-under media tab, needs baseline labs-nml with nml TSH and 24 hour urine-68mg . To see optho in Nov 2013-Per pt. Ok, serial u/s for growth, 2x/wk testing at 32 wks.     Past Surgical History  Procedure Laterality Date  . Cesarean section  2005/2010  . Female circumcision  36  . Tonsillectomy  1989  . Dilation and curettage of uterus  2009  . Cholecystectomy  11/28/2011    Procedure: LAPAROSCOPIC CHOLECYSTECTOMY WITH INTRAOPERATIVE CHOLANGIOGRAM;  Surgeon: Wilmon Arms. Corliss Skains, MD;  Location: [facility] OR;  Service: General;  Laterality: N/A;  . Cesarean section N/A  01/22/2013    Procedure: CESAREAN SECTION;  Surgeon: Reva Bores, MD;  Location: [facility] ORS;  Service: Obstetrics;  Laterality: N/A;  Repeat  . Cesarean section  2005  . Cesarean section  2010    Current Outpatient Prescriptions  Medication Sig Dispense Refill  . acetaminophen (TYLENOL) 325 MG tablet Take 650 mg by mouth every 6 (six) hours as needed for moderate pain.    Marland Kitchen diclofenac (VOLTAREN) 25 MG EC tablet Take 25 mg by mouth daily as needed for mild pain.    Marland Kitchen ibuprofen (ADVIL,MOTRIN) 800 MG tablet Take 1 tablet (800 mg total) by mouth 3 (three) times daily. 21 tablet 0  . Multiple Vitamins-Minerals (MULTIVITAMIN WITH MINERALS) tablet Take 1 tablet by mouth daily.    . promethazine (PHENERGAN) 25 MG tablet Take 25 mg by mouth.    . [DISCONTINUED] amitriptyline (ELAVIL) 25 MG tablet Take 1 tablet (25 mg total) by mouth at bedtime. (Patient not taking: Reported on 08/10/2015) 30 tablet 6  . [DISCONTINUED] eletriptan (RELPAX) 40 MG tablet Take 1 tablet (40 mg total) by mouth as needed for migraine. May repeat in 2 hours if headache persists or recurs. (Patient not taking: Reported on 08/10/2015) 8 tablet 0   No current facility-administered medications for this visit.    Allergies as of 11/28/2015 - Review Complete 11/28/2015  Allergen Reaction Noted  . Relpax [eletriptan] Shortness Of Breath 11/14/2015  . Oxycodone Itching 12/26/2011    Vitals: BP 166/90 mmHg  Pulse 110  Ht  (1.676 m)  Wt 185 lb 6.4 oz (84.097 kg)  BMI 29.94 kg/m2 Last Weight:  Wt Readings from Last 1 Encounters:  11/28/15 185 lb 6.4 oz (84.097 kg)   Last Height:   Ht Readings from Last 1 Encounters:  11/28/15  (1.676 m)     Neuro: Detailed Neurologic Exam  Speech:  Speech is normal; fluent and spontaneous with normal comprehension.  Cognition:  The patient is oriented to person, place, and time;  Cranial Nerves:  The pupils are equal, round, and reactive to light. The fundi are normal  and spontaneous venous pulsations are present. Visual fields are full to finger confrontation. Extraocular movements are intact. Trigeminal sensation is intact and the muscles of mastication are normal. The face is symmetric. The palate elevates in the midline. Hearing intact. Voice is normal. Shoulder shrug is normal. The tongue has normal motion without fasciculations.    Strength:  Strength is V/V in the upper and lower limbs.      Assessment/Plan: 44 year old female with migraines, worsening.   MRI of the brain was normal Start Trokendi  then increase to  Cambia for acute management F/u 3 months  Naomie Dean, MD  [facility]  [address] [place], [place] [id number]  Phone [phone] Fax [phone]  A total of 30 minutes was spent face-to-face with this patient. Over half this time was spent on counseling patient on the migraine diagnosis and different therapeutic options available.

## 2015-12-02 ENCOUNTER — Telehealth: Payer: Self-pay | Admitting: Neurology

## 2015-12-02 ENCOUNTER — Other Ambulatory Visit: Payer: Self-pay | Admitting: Neurology

## 2015-12-02 MED ORDER — NORTRIPTYLINE HCL 10 MG PO CAPS: 10.0000 mg | ORAL_CAPSULE | Freq: Every day | ORAL | Status: DC

## 2015-12-02 NOTE — Telephone Encounter (Signed)
Having side side effects to trokendi. Will start Nortriptyline. Spoke to patient.

## 2015-12-22 ENCOUNTER — Telehealth: Payer: Self-pay | Admitting: Neurology

## 2015-12-22 ENCOUNTER — Encounter: Payer: Self-pay | Admitting: Family Medicine

## 2015-12-22 ENCOUNTER — Ambulatory Visit (INDEPENDENT_AMBULATORY_CARE_PROVIDER_SITE_OTHER): Payer: 59 | Admitting: Family Medicine

## 2015-12-22 VITALS — BP 157/87 | HR 96 | Temp 98.2°F | Ht 66.0 in | Wt 180.7 lb

## 2015-12-22 DIAGNOSIS — G43701 Chronic migraine without aura, not intractable, with status migrainosus: Secondary | ICD-10-CM | POA: Diagnosis not present

## 2015-12-22 DIAGNOSIS — I10 Essential (primary) hypertension: Secondary | ICD-10-CM | POA: Diagnosis not present

## 2015-12-22 MED ORDER — HYDROCHLOROTHIAZIDE 25 MG PO TABS: 12.5000 mg | ORAL_TABLET | Freq: Every day | ORAL | Status: DC

## 2015-12-22 NOTE — Patient Instructions (Signed)
It was nice to meet you today.  Headaches - continue to use Nortryptaline nightly, use Diclofenac only when headache is severe. Apply heating pack to neck and shoulders, apply cold compress to forehead  Blood pressure - elevated today, start HCTZ 12.5 mg daily

## 2015-12-22 NOTE — Telephone Encounter (Signed)
Thanks that is too high and can be causing the headache.

## 2015-12-22 NOTE — Telephone Encounter (Signed)
Pt called and says she is still having headaches, and is checking her b/p and it is elevated each time. She wants to know if she should be seen here or her pcp. Please call and advise [phone]

## 2015-12-22 NOTE — Progress Notes (Signed)
   Subjective:    Patient ID: Susan Hopkins, female    DOB: 30-Dec-1970, 45 y.o.   MRN: [id number]  HPI 45 year old female presents for evaluation of headache and elevated blood pressure at home.  Headache/migraines - seen by Dr. Lucia GaskinsAhern (Neurology), last visit was on 01/28/2015. She has a history of migraine headaches with aura as well as tension headache. Recently started on nortriptyline which she takes nightly. Uses when necessary diclofenac and indomethacin. Uses diclofenac every couple of days for severe headache. Last dose was today. This medication does help her headaches. She describes her headaches as both unilateral and bilateral, most recent headache is over the left temporal area, she reports vision changes with aura, she also reports light and sound sensitivity  Reviewed most recent note from Dr. Lucia GaskinsAhern on 01/28/2015.  Elevated blood pressure - patient has been checking her blood pressure at home and is running 140-170/90-100. No associated chest pain. She is curious if she needs blood pressure medication. Blood pressures have been elevated at times when she does not have a headache.  Social-nonsmoker   Review of Systems  Constitutional: Negative for fever, chills and fatigue.  Respiratory: Negative for cough and shortness of breath.   Neurological: Positive for headaches. Negative for numbness.       Objective:   Physical Exam Vitals: Reviewed Gen.: Pleasant female, no acute distress HEENT: Normocephalic, pupils equal round and reactive to light, extraocular movements are intact, no scleral icterus, nasal septum midline, no rhinorrhea, moist mucous members, uvula midline, neck was supple MSK: Tenderness noted over bilateral trapezius and cervical neck Neuro: Cranial nerves II-12 intact, strength grossly 5/5 in all extremities, sensation to light touch intact, normal gait, normal pronator drift  Reviewed recent blood work including BMP MRI performed in March 2016 was negative  for intracranial process.    Assessment & Plan:  Chronic migraine without aura with status migrainosus, not intractable Patient presents for follow-up of chronic headache. Frequency of migraine headaches is decreased with nortriptyline. Continues to have almost daily tension headache that is responsive to diclofenac. -Discussed role of possible rebound headache and need to avoid daily medications if possible -Conservative measures discussed including cool compress to the forehead and warm compresses to neck and shoulders to decrease muscle tension -Elevated blood pressure may be contributing to headaches (please see problem specific plan) -Follow-up with PCP and neurologist encouraged  Essential hypertension, benign Home blood pressures are elevated. Blood pressures in office are also elevated. -Initiate HCTZ 12.5 mg daily -Follow-up with PCP in 2 weeks for blood pressure check

## 2015-12-22 NOTE — Telephone Encounter (Signed)
Spoke to patient she is concerned about elevated b/p over the past week w/ continuous HA : Sun- 141/92, Mon-150/90, Tue-153/93, Thurs-174/102. Amitriptyline, Diclofenac, Tylenol does not alleviate. 6-8 on pain scale. Patient has an appt w/ PCP this morning. Will c/b to notify Dr. Lucia GaskinsAhern of outcome.

## 2015-12-22 NOTE — Assessment & Plan Note (Signed)
Patient presents for follow-up of chronic headache. Frequency of migraine headaches is decreased with nortriptyline. Continues to have almost daily tension headache that is responsive to diclofenac. -Discussed role of possible rebound headache and need to avoid daily medications if possible -Conservative measures discussed including cool compress to the forehead and warm compresses to neck and shoulders to decrease muscle tension -Elevated blood pressure may be contributing to headaches (please see problem specific plan) -Follow-up with PCP and neurologist encouraged

## 2015-12-22 NOTE — Assessment & Plan Note (Signed)
Home blood pressures are elevated. Blood pressures in office are also elevated. -Initiate HCTZ 12.5 mg daily -Follow-up with PCP in 2 weeks for blood pressure check

## 2015-12-26 ENCOUNTER — Telehealth: Payer: Self-pay | Admitting: Neurology

## 2015-12-26 ENCOUNTER — Other Ambulatory Visit: Payer: Self-pay | Admitting: Neurology

## 2015-12-26 ENCOUNTER — Encounter: Payer: Self-pay | Admitting: *Deleted

## 2015-12-26 MED ORDER — PROPRANOLOL HCL ER 60 MG PO CP24: 60.0000 mg | ORAL_CAPSULE | Freq: Every day | ORAL | Status: DC

## 2015-12-26 MED ORDER — NORTRIPTYLINE HCL 10 MG PO CAPS: 20.0000 mg | ORAL_CAPSULE | Freq: Every day | ORAL | Status: DC

## 2015-12-26 NOTE — Telephone Encounter (Signed)
Called pt back/ She stated headache has not gotten better. Had this since last week. Asked pt about BP. BP sometimes high. PCP put her on hydrochlorothiazide to help with high BP. She started this past Friday night. She states it has been helping w/ BP. Had pt check BP while on phone. BP 155/97 in left arm. She is taking nortriptyline every night before bed. She thinks nortriptyline is helping but she still has this headache. She has tried Guamcambia, but she says if she takes it after aura, it does not help. Advised she can repeat dose after 2 hours  If needed. She has drank coffee/water and had a donut this morning. Advised she should eat and drink enough fluids to make sure she stays hydrated. Will call her back after I speak to Dr Lucia GaskinsAhern. She is in a room w/ a pt. She verbalized understanding.

## 2015-12-26 NOTE — Telephone Encounter (Signed)
Patient is calling and states she has a tremendous headache every day now.  Her medication is not working. Please call her.  Thanks!

## 2015-12-26 NOTE — Telephone Encounter (Signed)
Called pt. She is going to come around 12 pm to do migraine cocktail per Dr Lucia GaskinsAhern request. She is going to have someone drive her. Advised Dr Lucia GaskinsAhern will talk to her when she gets to office. She verbalized understanding.

## 2015-12-26 NOTE — Progress Notes (Addendum)
Verified allergies w/ pt, name, DOB.24G IV started in right AC at 1310. Attempts: 1 attempt. Cleaned with chlorhexidine prior to insertion. Site clean, dry and intact. Pt vitals stable prior to infusion. BP 144/93, pulse 90, temp 97.4. Headache rated at "7/10" on pain scale. Gave IV push compazine over 2 minutes (lot: [id number], expiration: 01/2017, NDC: 16109-604-54: 23155-294-31). Flushed before ketorolac. IV push Ketorolac over 2 minutes (lot: [id number], expiration: October 16, 2016, NDC: [id number]). Diluted with saline flush. Flushed before Depacon push. IV push Depacon 500mg /635ml x2 over 5 minutes (lot: [id number], expiration: 02/2018, NDC: 8657-8469-62: 0143-9785-01). Flushed after Depacon IV push. Gave IVPB Soulmedrol 500mg  (lot: [id number], expiration: 03/2019, NDC: 9528-4132-440009-0758-01). over 30 minutes w/ 100 mL 0.9% sodium chloride bag (Lot: [id number], expiration 10/2016, NDC: [id number]). Infusion stopped at 1405. D/C'd IV in right AC. Catheter tip intact upon removal. Pt tolerated well. IV site clean and intact. No redness. Pt rated headache at " 1/10" on pain scale. Vitals taken at 1408. BP 125/87, pulse 87, and temperature 97 degrees. Daughter with pt and driving pt home.

## 2015-12-26 NOTE — Telephone Encounter (Signed)
I started her on propranolol and increased the nortriptyline. Asked her to watch her BP, take it every day, watch for dizziness and chest pain and stop for anything concerning. thanks

## 2016-01-05 ENCOUNTER — Ambulatory Visit (INDEPENDENT_AMBULATORY_CARE_PROVIDER_SITE_OTHER): Payer: 59 | Admitting: Internal Medicine

## 2016-01-05 ENCOUNTER — Ambulatory Visit: Payer: Self-pay | Admitting: Obstetrics and Gynecology

## 2016-01-05 ENCOUNTER — Encounter: Payer: Self-pay | Admitting: Internal Medicine

## 2016-01-05 VITALS — BP 122/75 | HR 113 | Temp 98.2°F | Ht 66.0 in | Wt 180.8 lb

## 2016-01-05 DIAGNOSIS — I10 Essential (primary) hypertension: Secondary | ICD-10-CM | POA: Diagnosis not present

## 2016-01-05 DIAGNOSIS — E559 Vitamin D deficiency, unspecified: Secondary | ICD-10-CM | POA: Insufficient documentation

## 2016-01-05 DIAGNOSIS — D509 Iron deficiency anemia, unspecified: Secondary | ICD-10-CM | POA: Diagnosis not present

## 2016-01-05 DIAGNOSIS — M79674 Pain in right toe(s): Secondary | ICD-10-CM | POA: Diagnosis not present

## 2016-01-05 MED ORDER — FERROUS SULFATE 325 (65 FE) MG PO TABS: 325.0000 mg | ORAL_TABLET | Freq: Every day | ORAL | Status: DC

## 2016-01-05 MED ORDER — CHOLECALCIFEROL 25 MCG (1000 UT) PO TBDP: 1000.0000 [IU] | ORAL_TABLET | Freq: Every day | ORAL | Status: DC

## 2016-01-05 NOTE — Assessment & Plan Note (Addendum)
-   Prescribed daily ferrous sulfate. - Recommended taking a stool softener if she has trouble with constipation. Warned about dark stools.

## 2016-01-05 NOTE — Assessment & Plan Note (Signed)
-   Mild at 19. Prescribed cholecalciferol 1000 units daily.

## 2016-01-05 NOTE — Assessment & Plan Note (Signed)
-   Patient to start propranolol in addition to HCTZ, per neurology. - Advised patient to keep a log of blood pressures with 2-3 readings a day until she is able to follow-up with her neurologist in about 1-2 weeks.

## 2016-01-05 NOTE — Progress Notes (Signed)
Subjective:     Patient ID: Susan Hopkins, female   DOB: [birth year], 45 y.o.   MRN: [id number]  HPI HTN: - Was started on HCTZ 12.5 mg daily 12/22/15. She has not had any trouble taking this medication. - Has a blood pressure cuff at home and notes measurements are usually in the 120s-130s/80s. If stressed, blood pressure increases to 150s/90s. She notes that her blood pressures in the morning are normally lower than in the evening. - She has taken her blood pressures while having migraines and says pressures have been both high and low. - Endorses a sensation of palpitations, which she attributes to stress and possibly her migraine medications (especially nortriptyline).  - Her neurologist prescribed propranolol ER 60 mg after BP reading of 155/97 when patient presented for migraine cocktail. However, patient has not been able to fill this prescription yet due to insurance issues. She expects to fill be able to fill it in the next week.   Migraines: - Patient required migraine cocktail on 12/26/15. She has been without migraines since then but usually has reprieve from migraines after infusions. - Prior to infusion, she was having daily headaches. She takes diclofenac at the start of migraine and indomethacin prn for headaches.  - Nortriptyline was substituted for amitriptyline, as patient was experiencing significant sleepiness on the latter. Dose was doubled on 12/26/15, but patient has not been able to fill it yet due to insurance issues.   Fatigue: - Labs from 11/20/15 showed TSH WNL at 2.242; low iron at 32 (TIBC 273, %Sat 12); vitamin D-OH low at 19.  - Patient complains of feeling wiped out. - History of anemia. CBC 11/05/15 showed microcytic anemia with hgb 11.9 and MCV 76.7.   Right foot pain: - Pain x 2 days since miss-stepping on the stairs and jamming her 2nd toe. - She has not taken anything for pain. - She is able to walk normally in tennis shoes.   Review of Systems   Constitutional: Positive for fatigue.  Eyes:       Denies vision changes but reports aura with migraines.   Social: Never Smoker    Objective: Blood pressure 122/75, pulse 113, temperature 98.2 F (36.8 C), temperature source Oral, height  (1.676 m), weight 180 lb 12.8 oz (82.01 kg). Repeat pulse counted out at bedside in 80s.   Physical Exam  Constitutional: She appears well-developed and well-nourished. No distress.  Cardiovascular: Normal rate, regular rhythm, normal heart sounds and intact distal pulses.  Exam reveals no gallop and no friction rub.   No murmur heard. Musculoskeletal:  Minimal swelling of right 2nd toe. No deformity or bruising. No pain when moving DIP or PIP but some at MTP. Mild TTP at ball of foot below MTP joint. Patient able to lift off on her toes with plantarflexion.       Assessment:     Ms. Gauger is a 45-y.o. woman who presents for lab review and blood pressure check. Blood pressure is controlled at today's visit, but she has had recent elevated readings after starting HCTZ. December labs show mild vitamin D deficiency and low iron. Low suspicion for R 2nd MTP fracture given intact mobility and minimal swelling; suspect inflammation at MTP joint.    Plan:     Advised return in 3 months to repeat vitamin levels or sooner if she having problems with her blood pressure and unable to see her neurologist after starting propranolol.   Essential hypertension, benign - Patient to start  propranolol in addition to HCTZ, per neurology. - Advised patient to keep a log of blood pressures with 2-3 readings a day until she is able to follow-up with her neurologist in about 1-2 weeks.   ANEMIA, IRON DEFICIENCY, UNSPEC. - Prescribed daily ferrous sulfate. - Recommended taking a stool softener if she has trouble with constipation. Warned about dark stools.   Vitamin D deficiency - Mild at 87. Prescribed cholecalciferol 1000 units daily.   Pain of toe of right  foot - Recommended icing and wearing sneakers until pain improves. - Consider x-ray of right foot if pain does not improve.   Dani Gobble, MD [facility], PGY-1

## 2016-01-05 NOTE — Assessment & Plan Note (Addendum)
-   Recommended icing and wearing sneakers until pain improves. - Consider x-ray of right foot if pain does not improve.

## 2016-01-05 NOTE — Patient Instructions (Signed)
Susan Hopkins,  Thank you for coming in today.  Once you get the propranolol prescription, please keep a log of your blood pressures.  I have prescribed iron and vitamin d supplements. Iron can cause constipation, so I would recommend trying a stool softener if you have are having decreased bowel movements.  For your foot, please try icing. If it is not improving in the next week or two, please return to clinic.  Please return in 3 months to repeat your vitamin levels, or sooner if you have problems with your blood pressure.  Thank you, Dr. Sampson Goon

## 2016-01-09 ENCOUNTER — Ambulatory Visit: Payer: Self-pay | Admitting: Neurology

## 2016-01-10 ENCOUNTER — Other Ambulatory Visit: Payer: Self-pay | Admitting: Neurology

## 2016-01-10 ENCOUNTER — Telehealth: Payer: Self-pay | Admitting: Neurology

## 2016-01-10 MED ORDER — NORTRIPTYLINE HCL 10 MG PO CAPS: 20.0000 mg | ORAL_CAPSULE | Freq: Every day | ORAL | Status: DC

## 2016-01-10 MED ORDER — TOPIRAMATE 25 MG PO TABS: 25.0000 mg | ORAL_TABLET | Freq: Every day | ORAL | Status: DC

## 2016-01-10 MED ORDER — NORTRIPTYLINE HCL 10 MG PO CAPS: 30.0000 mg | ORAL_CAPSULE | Freq: Every day | ORAL | Status: DC

## 2016-01-10 NOTE — Telephone Encounter (Signed)
Susan Hopkins, will increase the nortriptyline to  at night.  thanks.

## 2016-01-10 NOTE — Telephone Encounter (Signed)
Patient called regarding propranolol ER (INDERAL LA) 60 MG 24 hr capsule, states Dr. Lucia Gaskins advised her to call if she had anymore symptoms of hypotension. States she feels hot, sweaty, fatigue, pale, feels like she can't stand, like she needs to lie down, has happened 4 or 5 times today. BP this morning 107/74, just now it was 104/72.

## 2016-01-10 NOTE — Telephone Encounter (Signed)
Called pt back. Advised to stop medication per Dr Lucia Gaskins. She is going to review her chart and I will call her back to advise. Pt stated she doubled nortriptyline dose as requested by Dr Lucia Gaskins (total ). She also took propranolol today. She is experiencing GI upset. She has not taken her temp. Advised she should take her temp to make sure she does not have a fever. She verbalized understanding.

## 2016-01-11 ENCOUNTER — Telehealth: Payer: Self-pay | Admitting: Obstetrics and Gynecology

## 2016-01-11 MED ORDER — VITAMIN D (ERGOCALCIFEROL) 1.25 MG (50000 UNIT) PO CAPS: 50000.0000 [IU] | ORAL_CAPSULE | ORAL | Status: DC

## 2016-01-11 NOTE — Telephone Encounter (Signed)
Sent in Rx for Vitamin D. Patient to take as instructed. I do not believe her vitamin D deficiency is related to her head and back pain. She follows with neurology for migraines. Medications are being adjusted. If she feels the need she can come in for a SDA appointment as I am unable to assess her pain through phone call.

## 2016-01-11 NOTE — Telephone Encounter (Signed)
LVM relaying Dr Lucia Gaskins message below. Asked her to call back if she has any further questions. Told her to stop propranolol as discussed yesterday.

## 2016-01-11 NOTE — Telephone Encounter (Signed)
LM for patient on identified VM.  Jazmin Hartsell,CMA  

## 2016-01-11 NOTE — Telephone Encounter (Signed)
Will forward to MD. Sanskriti Greenlaw,CMA  

## 2016-01-11 NOTE — Telephone Encounter (Signed)
Pt calling for advice. Pt has Vitamin D deficiency Pt experiencing back and head pain. Pt would like to know if she needs a separate Rx for this or would OTC Vit D supplement help. Please advise, Susan Hopkins, ASA

## 2016-02-01 ENCOUNTER — Ambulatory Visit (INDEPENDENT_AMBULATORY_CARE_PROVIDER_SITE_OTHER): Payer: Self-pay | Admitting: Obstetrics and Gynecology

## 2016-02-01 ENCOUNTER — Encounter: Payer: Self-pay | Admitting: Obstetrics and Gynecology

## 2016-02-01 VITALS — BP 130/90 | HR 108 | Temp 98.0°F | Ht 66.0 in | Wt 182.0 lb

## 2016-02-01 DIAGNOSIS — I10 Essential (primary) hypertension: Secondary | ICD-10-CM

## 2016-02-01 DIAGNOSIS — R000 Tachycardia, unspecified: Secondary | ICD-10-CM

## 2016-02-01 DIAGNOSIS — G43909 Migraine, unspecified, not intractable, without status migrainosus: Secondary | ICD-10-CM

## 2016-02-01 MED ORDER — PROPRANOLOL HCL 20 MG PO TABS: 20.0000 mg | ORAL_TABLET | Freq: Two times a day (BID) | ORAL | Status: DC

## 2016-02-01 NOTE — Progress Notes (Signed)
     Subjective: Chief Complaint  Patient presents with  . Hypertension     HPI: Susan Hopkins is a 45 y.o. presenting to clinic today to discuss the following:  #Hypertension Blood pressure at home: highest 140/96; check blood pressures daily Blood pressure today:  130/90 Taking Meds: HCTZ 12.5mg  Side effects: None ROS: Denies headache, dizziness, visual changes, nausea, vomiting, chest pain, abdominal pain or shortness of breath.  #Migranes - follows with neurology - states she has tried several different medications and has side effects from them;  These medications were discontinued - now currently on nortriptyline and diclofenac prn for migraines - needs a new referral to continue seeing neurologist  #Tachycardia - patient states that when she was on propranolol she did not have tachycardia but it did reduce her blood pressure -states that medication helped well with her tachycardia and HR was usually in the 70s - heart rate elevated today - feels palpitations -medication was started by neurology for migraines but discontinued when patient became hypotensive    ROS noted in HPI.  Smoking status noted - never smoker Past Medical, Surgical, Social, and Family History Reviewed & Updated per EMR.  Objective: BP 130/90 mmHg  Pulse 108  Temp(Src) 98 F (36.7 C) (Oral)  Ht  (1.676 m)  Wt 182 lb (82.555 kg)  BMI 29.39 kg/m2 Vitals and nursing notes reviewed  Physical Exam  Constitutional: She is well-developed, well-nourished, and in no distress.  Cardiovascular: Regular rhythm, normal heart sounds and normal pulses.  Tachycardia present.   No murmur heard. Pulmonary/Chest: Effort normal and breath sounds normal.  Skin: Skin is warm and dry.    Assessment/Plan: Please see problem based Assessment and Plan   Orders Placed This Encounter  Procedures  . Ambulatory referral to Neurology    Referral Priority:  Routine    Referral Type:  Consultation   Referral Reason:  Specialty Services Required    Requested Specialty:  Neurology    Number of Visits Requested:  1    Meds ordered this encounter  Medications  . propranolol (INDERAL) 20 MG tablet    Sig: Take 1 tablet (20 mg total) by mouth 2 (two) times daily.    Dispense:  30 tablet    Refill:  0    Caryl Ada, DO 02/01/2016, 4:22 PM PGY-2, [facility]

## 2016-02-01 NOTE — Assessment & Plan Note (Signed)
Restarted propranolol at reduced dose. She responded well to BB prior for tachycardia.

## 2016-02-01 NOTE — Assessment & Plan Note (Signed)
Continues to complain of migraines. Has been following up with neurology and medications being adjusted. Is currently taking nortriptyline daily and diclofenac when necessary. States that the frequency of headaches has been reduced.  Referral for patient to continue see neurologist placed

## 2016-02-01 NOTE — Patient Instructions (Signed)
I am pleased to hear that things are going well for you.  Here are some of the things we discussed today: -restarted propranolol.  twice a day. If you notice your BPs are getting too low again please stop and call my office -New referral for neurologist placed  Thanks for allowing me to be a part of your care! Dr. Doroteo Glassman

## 2016-02-01 NOTE — Assessment & Plan Note (Signed)
Blood pressure elevated today. Patient continues to use HCTZ 12.5 mg daily. Had stopped taking propranolol 60 mg XL due to hypotension. Medication was started by neurology for migraines and to help with tachycardia.  Restart her propranolol at  BID today to help with blood pressure , migraines , but also to help with tachycardia.  Patient is very compliant and able to take her home blood pressures at home. Encouraged her to continue to take blood pressure readings and if she becomes hypotensive to stop medication and call office.

## 2016-02-07 ENCOUNTER — Encounter: Payer: Self-pay | Admitting: Family Medicine

## 2016-02-14 ENCOUNTER — Telehealth: Payer: Self-pay | Admitting: Neurology

## 2016-02-14 NOTE — Telephone Encounter (Signed)
Pt has been rx propanolol  2x day by her pcp. Pt is still on nortriptyline (PAMELOR) 10 MG capsule. It is making the pt extremely fatigued. Please call and advise [phone]

## 2016-02-16 NOTE — Telephone Encounter (Signed)
Tried calling patient several times, mailbox is full. If the headaches are better on the propranolol we can decrease the Nortriptyline. I gave her 10mg  capsules but I had instructed her to take 30mg  at night but I am not sure how much she is taking. I would prefer patient to come and see me in the office thanks.  Would you try giving her a call on Monday emma? Thanks

## 2016-02-19 NOTE — Telephone Encounter (Signed)
LVM for pt to call office back. Relayed message below per Dr Lucia GaskinsAhern. Pt has f/u appt on 02/26/16.

## 2016-02-23 ENCOUNTER — Other Ambulatory Visit: Payer: Self-pay | Admitting: Obstetrics and Gynecology

## 2016-02-23 MED ORDER — PROPRANOLOL HCL 20 MG PO TABS: 20.0000 mg | ORAL_TABLET | Freq: Two times a day (BID) | ORAL | Status: DC

## 2016-02-23 NOTE — Telephone Encounter (Signed)
Pt called and needs a refill on her Propranolol called in. jw

## 2016-02-26 ENCOUNTER — Ambulatory Visit: Payer: 59 | Admitting: Neurology

## 2016-02-27 ENCOUNTER — Encounter: Payer: Self-pay | Admitting: Neurology

## 2016-03-25 ENCOUNTER — Other Ambulatory Visit: Payer: Self-pay | Admitting: *Deleted

## 2016-03-25 DIAGNOSIS — I10 Essential (primary) hypertension: Secondary | ICD-10-CM

## 2016-03-25 MED ORDER — HYDROCHLOROTHIAZIDE 25 MG PO TABS: 12.5000 mg | ORAL_TABLET | Freq: Every day | ORAL | Status: DC

## 2016-03-28 ENCOUNTER — Ambulatory Visit: Payer: Self-pay

## 2016-05-07 ENCOUNTER — Encounter: Payer: Self-pay | Admitting: Family Medicine

## 2016-05-07 ENCOUNTER — Ambulatory Visit (INDEPENDENT_AMBULATORY_CARE_PROVIDER_SITE_OTHER): Payer: Self-pay | Admitting: Family Medicine

## 2016-05-07 VITALS — BP 116/81 | HR 89 | Temp 98.2°F | Wt 181.0 lb

## 2016-05-07 DIAGNOSIS — J3489 Other specified disorders of nose and nasal sinuses: Secondary | ICD-10-CM

## 2016-05-07 DIAGNOSIS — R5383 Other fatigue: Secondary | ICD-10-CM

## 2016-05-07 LAB — CBC
HEMATOCRIT: 36.3 % (ref 35.0–45.0)
Hemoglobin: 11.4 g/dL — ABNORMAL LOW (ref 11.7–15.5)
MCH: 23.4 pg — AB (ref 27.0–33.0)
MCHC: 31.4 g/dL — AB (ref 32.0–36.0)
MCV: 74.5 fL — ABNORMAL LOW (ref 80.0–100.0)
MPV: 9.6 fL (ref 7.5–12.5)
Platelets: 299 10*3/uL (ref 140–400)
RBC: 4.87 MIL/uL (ref 3.80–5.10)
RDW: 15.7 % — AB (ref 11.0–15.0)
WBC: 6.3 10*3/uL (ref 3.8–10.8)

## 2016-05-07 NOTE — Patient Instructions (Signed)
It was nice seeing you today. I am sorry you still feel tired. I will recheck some of the blood test you had in the past. Please start multivitamins. Also start graded exercise as tolerated. This might help improve your symptoms. Follow up as needed.  Fatigue Fatigue is feeling tired all of the time, a lack of energy, or a lack of motivation. Occasional or mild fatigue is often a normal response to activity or life in general. However, long-lasting (chronic) or extreme fatigue may indicate an underlying medical condition. HOME CARE INSTRUCTIONS  Watch your fatigue for any changes. The following actions may help to lessen any discomfort you are feeling:  Talk to your health care provider about how much sleep you need each night. Try to get the required amount every night.  Take medicines only as directed by your health care provider.  Eat a healthy and nutritious diet. Ask your health care provider if you need help changing your diet.  Drink enough fluid to keep your urine clear or pale yellow.  Practice ways of relaxing, such as yoga, meditation, massage therapy, or acupuncture.  Exercise regularly.   Change situations that cause you stress. Try to keep your work and personal routine reasonable.  Do not abuse illegal drugs.  Limit alcohol intake to no more than 1 drink per day for nonpregnant women and 2 drinks per day for men. One drink equals 12 ounces of beer, 5 ounces of wine, or 1 ounces of hard liquor.  Take a multivitamin, if directed by your health care provider. SEEK MEDICAL CARE IF:   Your fatigue does not get better.  You have a fever.   You have unintentional weight loss or gain.  You have headaches.   You have difficulty:   Falling asleep.  Sleeping throughout the night.  You feel angry, guilty, anxious, or sad.   You are unable to have a bowel movement (constipation).   You skin is dry.   Your legs or another part of your body is swollen.   SEEK IMMEDIATE MEDICAL CARE IF:   You feel confused.   Your vision is blurry.  You feel faint or pass out.   You have a severe headache.   You have severe abdominal, pelvic, or back pain.   You have chest pain, shortness of breath, or an irregular or fast heartbeat.   You are unable to urinate or you urinate less than normal.   You develop abnormal bleeding, such as bleeding from the rectum, vagina, nose, lungs, or nipples.  You vomit blood.   You have thoughts about harming yourself or committing suicide.   You are worried that you might harm someone else.    This information is not intended to replace advice given to you by your health care provider. Make sure you discuss any questions you have with your health care provider.   Document Released: 09/29/2007 Document Revised: 12/23/2014 Document Reviewed: 04/05/2014 Elsevier Interactive Patient Education [employer]2016 Elsevier Inc.

## 2016-05-07 NOTE — Progress Notes (Addendum)
Subjective:     Patient ID: Susan Hopkins, female   DOB: 1971/07/24, 45 y.o.   MRN: [id number]  HPI Fatigue: Feels tire, weak and sleepy all the time, on going for 2-3 months. She was seen by her PCP who did blood work and noticed she had Vitamin D deficiency. She was given Vitamin D supplement for 8 wks. She was also on iron pills but she stopped it a while back. No fall or LOC. Appetite is good, no weight loss,no N/V, no change in bowel habit. She also endorsed occasional shoulder pain associated with her symptoms. Denies depression, denies anxiety. 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 Nasal soreness: she got her nose pierced more than 1 month ago, since then she has not completely healed. She endorsed frequent change in her nose ring which might have irritated her wound. Since 1-2 wks ago she had not changed her ring. She denies any pain or discharge, just some feeling of bump in her nostrils. She denies fever. She stated she [place] her [place] mall under a clean condition.  Current Outpatient Prescriptions on File Prior to Visit  Medication Sig Dispense Refill  . hydrochlorothiazide (HYDRODIURIL) 25 MG tablet Take 0.5 tablets (12.5 mg total) by mouth daily. 45 tablet 1  . nortriptyline (PAMELOR) 10 MG capsule Take 3 capsules (30 mg total) by mouth at bedtime. 90 capsule 11  . propranolol (INDERAL) 20 MG tablet Take 1 tablet (20 mg total) by mouth 2 (two) times daily. 60 tablet 2  . acetaminophen (TYLENOL) 325 MG tablet Take 650 mg by mouth every 6 (six) hours as needed for moderate pain. Reported on 05/07/2016    . Cholecalciferol 1000 units TBDP Take 1,000 Units by mouth daily. (Patient not taking: Reported on 05/07/2016) 60 tablet 2  . Diclofenac Potassium 50 MG PACK Take 50 mg by mouth once as needed. Take once daily as needed with headache onset. Please take with food (Patient not taking: Reported on 05/07/2016) 30 each  11  . ferrous sulfate 325 (65 FE) MG tablet Take 1 tablet (325 mg total) by mouth daily with breakfast. (Patient not taking: Reported on 05/07/2016) 30 tablet 2  . indomethacin (INDOCIN) 25 MG capsule Take 25 mg by mouth once as needed. Reported on 05/07/2016    . Multiple Vitamins-Minerals (MULTIVITAMIN WITH MINERALS) tablet Take 1 tablet by mouth daily. Reported on 05/07/2016    . Vitamin D, Ergocalciferol, (DRISDOL) 50000 units CAPS capsule Take 1 capsule (50,000 Units total) by mouth every 7 (seven) days. Total course of 8 weeks. (Patient not taking: Reported on 05/07/2016) 8 capsule 0  . [DISCONTINUED] amitriptyline (ELAVIL) 25 MG tablet Take 1 tablet (25 mg total) by mouth at bedtime. (Patient not taking: Reported on 08/10/2015) 30 tablet 6  . [DISCONTINUED] eletriptan (RELPAX) 40 MG tablet Take 1 tablet (40 mg total) by mouth as needed for migraine. May repeat in 2 hours if headache persists or recurs. (Patient not taking: Reported on 08/10/2015) 8 tablet 0   No current facility-administered medications on file prior to visit.   Past Medical History  Diagnosis Date  . ADVANCED MATERNAL AGE 66[birth year]02[birth year]2008  . DIABETES MELLITUS, GESTATIONAL, HX OF 05/18/2008  . 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 Contact dermatitis 08/27/2011  . 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 Migraine headache     last migraine in Sept 2012  . GERD (gastroesophageal  reflux disease)     doesn't take anything for acid reflux  . Gastric ulcer     history of  . Hemorrhoids   . Constipation   . Diarrhea   . ANEMIA, IRON DEFICIENCY, UNSPEC. 02/12/2007    after IUD  . Diabetes mellitus without complication (HCC)     gestational diab. currently & with infant #2  . Gestational diabetes A2/B 07/20/2012    Fetal echo-nml-under media tab, needs baseline labs-nml with nml TSH and 24 hour urine-68mg . To see optho in Nov 2013-Per pt. Ok, serial u/s for growth, 2x/wk testing at 32 wks.    . IUD complication (HCC) 07/28/2015     Review of Systems  Constitutional: Positive for fatigue. Negative for fever, appetite change and unexpected weight change.  HENT:       Nasal soreness  Respiratory: Negative.   Cardiovascular: Negative.   Gastrointestinal: Negative.   Genitourinary: Negative.   Neurological: Negative.   Psychiatric/Behavioral: Negative for suicidal ideas, sleep disturbance and self-injury. The patient is not nervous/anxious.        Stressed  All other systems reviewed and are negative.      Filed Vitals:   05/07/16 1117  BP: 116/81  Pulse: 89  Temp: 98.2 F (36.8 C)  TempSrc: Oral  Weight: 181 lb (82.101 kg)    Objective:   Physical Exam  Constitutional: She is oriented to person, place, and time. She appears well-developed. No distress.  HENT:  Head: Normocephalic.  Nose:    No Conjunctiva pallor.  Cardiovascular: Normal rate, regular rhythm and normal heart sounds.   No murmur heard. Pulmonary/Chest: Effort normal and breath sounds normal. No respiratory distress. She has no wheezes.  Abdominal: Soft. Bowel sounds are normal. She exhibits no distension and no mass. There is no tenderness.  Musculoskeletal: Normal range of motion. She exhibits no edema.  Neurological: She is alert and oriented to person, place, and time.  Nursing note and vitals reviewed.      Assessment:     Fatigue  Nasal soreness    Plan:     Check problem list.

## 2016-05-07 NOTE — Assessment & Plan Note (Addendum)
??   Chronic fatigue syndrome vs stress related vs anemia or vitamin deficiency. Pregnancy unlikely due to IUD in situ. I rechecked TSH, CBC, Vitamin D and also checked B12 today. In the mean time she is advised to start MVI. I will contact her with test result. I also recommended stress relieving mechanism as well as graded exercise. She stated [facility] and she will start exercising. F/U as needed.

## 2016-05-08 ENCOUNTER — Telehealth: Payer: Self-pay | Admitting: Family Medicine

## 2016-05-08 DIAGNOSIS — J3489 Other specified disorders of nose and nasal sinuses: Secondary | ICD-10-CM | POA: Insufficient documentation

## 2016-05-08 LAB — VITAMIN D 25 HYDROXY (VIT D DEFICIENCY, FRACTURES): VIT D 25 HYDROXY: 27 ng/mL — AB (ref 30–100)

## 2016-05-08 LAB — VITAMIN B12: Vitamin B-12: 336 pg/mL (ref 200–1100)

## 2016-05-08 LAB — TSH: TSH: 2.38 mIU/L

## 2016-05-08 NOTE — Addendum Note (Signed)
Addended by: Janit PaganENIOLA, Demetrious Rainford T on: 05/08/2016 04:44 AM   Modules accepted: Level of Service

## 2016-05-08 NOTE — Assessment & Plan Note (Signed)
Normal nasal exam. Patient reassured that it might take time for complete healing since nasal blood supply is small compared to other part of the body. She is advised to stop manipulating her nose ring to avoid further irritation. Follow up with PCP soon for reassessment.

## 2016-05-08 NOTE — Telephone Encounter (Signed)
Message left to call back

## 2016-05-09 NOTE — Telephone Encounter (Signed)
Test result discussed with patient. May continue MVI, ferrous sulfate and OTC vitamin D supplement.  I again stressed the needed for graded exercise as tolerated. She agreed with plan.

## 2016-05-19 ENCOUNTER — Encounter (HOSPITAL_COMMUNITY): Payer: Self-pay

## 2016-05-19 DIAGNOSIS — Z8742 Personal history of other diseases of the female genital tract: Secondary | ICD-10-CM | POA: Insufficient documentation

## 2016-05-19 DIAGNOSIS — D509 Iron deficiency anemia, unspecified: Secondary | ICD-10-CM | POA: Insufficient documentation

## 2016-05-19 DIAGNOSIS — Z872 Personal history of diseases of the skin and subcutaneous tissue: Secondary | ICD-10-CM | POA: Insufficient documentation

## 2016-05-19 DIAGNOSIS — G43109 Migraine with aura, not intractable, without status migrainosus: Secondary | ICD-10-CM | POA: Insufficient documentation

## 2016-05-19 DIAGNOSIS — Z8719 Personal history of other diseases of the digestive system: Secondary | ICD-10-CM | POA: Insufficient documentation

## 2016-05-19 DIAGNOSIS — Z8632 Personal history of gestational diabetes: Secondary | ICD-10-CM | POA: Insufficient documentation

## 2016-05-19 DIAGNOSIS — Z79899 Other long term (current) drug therapy: Secondary | ICD-10-CM | POA: Insufficient documentation

## 2016-05-19 NOTE — ED Notes (Signed)
Pt complaining of a migraine since yesterday. Hx: of migraines. Pt is nauseated, no vomiting.

## 2016-05-20 ENCOUNTER — Emergency Department (HOSPITAL_COMMUNITY)
Admission: EM | Admit: 2016-05-20 | Discharge: 2016-05-20 | Disposition: A | Discharge status: Home / Self Care | Payer: Self-pay | Attending: Emergency Medicine | Admitting: Emergency Medicine

## 2016-05-20 DIAGNOSIS — G43109 Migraine with aura, not intractable, without status migrainosus: Secondary | ICD-10-CM

## 2016-05-20 MED ORDER — DIPHENHYDRAMINE HCL 50 MG/ML IJ SOLN
25.0000 mg | Freq: Once | INTRAMUSCULAR | Status: AC
  Administered 2016-05-20: 25 mg via INTRAVENOUS
  Filled 2016-05-20: qty 1

## 2016-05-20 MED ORDER — METOCLOPRAMIDE HCL 5 MG/ML IJ SOLN
10.0000 mg | Freq: Once | INTRAMUSCULAR | Status: AC
  Administered 2016-05-20: 10 mg via INTRAVENOUS
  Filled 2016-05-20: qty 2

## 2016-05-20 MED ORDER — KETOROLAC TROMETHAMINE 30 MG/ML IJ SOLN
30.0000 mg | Freq: Once | INTRAMUSCULAR | Status: AC
  Administered 2016-05-20: 30 mg via INTRAVENOUS
  Filled 2016-05-20: qty 1

## 2016-05-20 MED ORDER — SODIUM CHLORIDE 0.9 % IV BOLUS (SEPSIS)
500.0000 mL | Freq: Once | INTRAVENOUS | Status: AC
  Administered 2016-05-20: 500 mL via INTRAVENOUS

## 2016-05-20 NOTE — Discharge Instructions (Signed)

## 2016-05-20 NOTE — ED Notes (Signed)
Pt st's headache has subsided

## 2016-05-20 NOTE — ED Notes (Signed)
Pt c/o migraine headache.  Onset yesterday, st's she took her meds at home without relief.

## 2016-05-20 NOTE — ED Provider Notes (Signed)
CSN: [id number]     Arrival date & time 05/19/16  2316 History   First MD Initiated Contact with Patient 05/20/16 0021     Chief Complaint  Patient presents with  . Migraine     (Consider location/radiation/quality/duration/timing/severity/associated sxs/prior Treatment) HPI Comments: Patient with a history of Migraines presents today with a chief complaint of headache.  Pain has been present since yesterday.  Headache gradual in onset.  The headache did get better for a couple of hours, but then returned.  He denies any acute injury or trauma to the head.  She states that this Migraine feels like her typical migraine.  She reports associated photophobia and nausea.  She denies vomiting, vision changes, fever, chills, neck pain/stiffness, numbness, tingling, focal weakness, or any other symptoms at this time.  She reports that she has not taken any medications prior to arrival.  She is currently fasting for religous reasons and has not been taking her prophylactic migraine medications.    Patient is a 45 y.o. female presenting with migraines. The history is provided by the patient.  Migraine    Past Medical History  Diagnosis Date  . ADVANCED MATERNAL AGE 56/02/2008  . DIABETES MELLITUS, GESTATIONAL, HX OF 05/18/2008  . 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 Contact dermatitis 08/27/2011  . 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 Migraine headache     last migraine in Sept 2012  . GERD (gastroesophageal reflux disease)     doesn't take anything for acid reflux  . Gastric ulcer     history of  . Hemorrhoids   . Constipation   . Diarrhea   . ANEMIA, IRON DEFICIENCY, UNSPEC. 02/12/2007    after IUD  . Diabetes mellitus without complication (HCC)     gestational diab. currently & with infant #2  . Gestational diabetes A2/B 07/20/2012    Fetal echo-nml-under media tab, needs baseline labs-nml with nml TSH and 24 hour urine-68mg . To see optho  in Nov 2013-Per pt. Ok, serial u/s for growth, 2x/wk testing at 32 wks.   . IUD complication (HCC) 07/28/2015   Past Surgical History  Procedure Laterality Date  . Cesarean section  2005/2010  . Female circumcision  16  . Tonsillectomy  1989  . Dilation and curettage of uterus  2009  . Cholecystectomy  11/28/2011    Procedure: LAPAROSCOPIC CHOLECYSTECTOMY WITH INTRAOPERATIVE CHOLANGIOGRAM;  Surgeon: Wilmon Arms. Corliss Skains, MD;  Location: [facility];  Service: General;  Laterality: N/A;  . Cesarean section N/A 01/22/2013    Procedure: CESAREAN SECTION;  Surgeon: Reva Bores, MD;  Location: [facility];  Service: Obstetrics;  Laterality: N/A;  Repeat  . Cesarean section  2005  . Cesarean section  2010   Family History  Problem Relation Age of Onset  . Diabetes Mother   . Hypertension Mother   . Hyperlipidemia Mother   . Hypertension Father   . Hyperlipidemia Father   . Anesthesia problems Neg Hx   . Hypotension Neg Hx   . Malignant hyperthermia Neg Hx   . Pseudochol deficiency Neg Hx    Social History  Substance Use Topics  . Smoking status: Never Smoker   . Smokeless tobacco: Never Used  . Alcohol Use: No   OB History    Gravida Para Term Preterm AB TAB SAB Ectopic Multiple Living   Review of Systems  All other systems reviewed and  are negative.     Allergies  Relpax and Oxycodone  Home Medications   Prior to Admission medications   Medication Sig Start Date End Date Taking? Authorizing Provider  acetaminophen (TYLENOL) 325 MG tablet Take 650 mg by mouth every 6 (six) hours as needed for moderate pain. Reported on 05/07/2016    Historical Provider, MD  Cholecalciferol 1000 units TBDP Take 1,000 Units by mouth daily. Patient not taking: Reported on 05/07/2016 01/05/16   Casey BurkittHillary Moen Fitzgerald, MD  Diclofenac Potassium 50 MG PACK Take 50 mg by mouth once as needed. Take once daily as needed with headache onset. Please take with food Patient not taking: Reported  on 05/07/2016 11/28/15   Anson FretAntonia B Ahern, MD  ferrous sulfate 325 (65 FE) MG tablet Take 1 tablet (325 mg total) by mouth daily with breakfast. Patient not taking: Reported on 05/07/2016 01/05/16   Casey BurkittHillary Moen Fitzgerald, MD  hydrochlorothiazide (HYDRODIURIL) 25 MG tablet Take 0.5 tablets (12.5 mg total) by mouth daily. 03/25/16   Erasmo DownerAngela M Bacigalupo, MD  indomethacin (INDOCIN) 25 MG capsule Take 25 mg by mouth once as needed. Reported on 05/07/2016    Historical Provider, MD  Multiple Vitamins-Minerals (MULTIVITAMIN WITH MINERALS) tablet Take 1 tablet by mouth daily. Reported on 05/07/2016    Historical Provider, MD  nortriptyline (PAMELOR) 10 MG capsule Take 3 capsules (30 mg total) by mouth at bedtime. 01/10/16   Anson FretAntonia B Ahern, MD  propranolol (INDERAL) 20 MG tablet Take 1 tablet (20 mg total) by mouth 2 (two) times daily. 02/23/16   Pincus LargeJazma Y Phelps, DO  Vitamin D, Ergocalciferol, (DRISDOL) 50000 units CAPS capsule Take 1 capsule (50,000 Units total) by mouth every 7 (seven) days. Total course of 8 weeks. Patient not taking: Reported on 05/07/2016 01/11/16   Pincus LargeJazma Y Phelps, DO   BP 126/85 mmHg  Pulse 86  Temp(Src) 98 F (36.7 C) (Oral)  Resp 18  Ht 5\' 6"  (1.676 m)  Wt 82.129 kg  BMI 29.24 kg/m2  SpO2 98% Physical Exam  Constitutional: She appears well-developed and well-nourished.  HENT:  Head: Normocephalic and atraumatic.  Eyes: EOM are normal. Pupils are equal, round, and reactive to light.  Neck: Normal range of motion. Neck supple.  Cardiovascular: Normal rate, regular rhythm and normal heart sounds.   Pulmonary/Chest: Effort normal and breath sounds normal.  Musculoskeletal: Normal range of motion.  Neurological: She is alert. She has normal strength. No cranial nerve deficit or sensory deficit. Coordination and gait normal.  Skin: Skin is warm and dry.  Psychiatric: She has a normal mood and affect.  Nursing note and vitals reviewed.   ED Course  Procedures (including critical  care time) Labs Review Labs Reviewed - No data to display  Imaging Review No results found. I have personally reviewed and evaluated these images and lab results as part of my medical decision-making.   EKG Interpretation None     1:24 AM Reassessed patient.  She reports significant improvement in symptoms at this time. MDM   Final diagnoses:  None   Pt HA treated and improved while in ED.  Presentation is like pts typical migraine HA and non concerning for [facility], ICH, Meningitis, or temporal arteritis. Pt is afebrile with no focal neuro deficits, nuchal rigidity, or change in vision. Pt stable for discharge.  Return precautions given. Pt verbalizes understanding and is agreeable with plan to dc.      Santiago GladHeather Najma Bozarth, PA-C 05/20/16 0136  Gilda Creasehristopher J Pollina, MD 05/20/16 [phone]

## 2016-05-22 ENCOUNTER — Telehealth: Payer: Self-pay | Admitting: Neurology

## 2016-05-22 NOTE — Telephone Encounter (Signed)
Pt called said she's had migraine for past 4 days. She is takingnortriptyline (PAMELOR) 10 MG capsule for the past 4 mths and does not feel it has helped.  Please call. Appt scheduled for 06/03/16.

## 2016-05-22 NOTE — Telephone Encounter (Signed)
Tried calling pt. Could not not LVM. Mailbox full.

## 2016-05-22 NOTE — Telephone Encounter (Signed)
Dr Lucia GaskinsAhern- I was unable to reach pt, fyi.  Tried calling pt again. Rang once and went to VM. VM still full, unable to LVM. Please relay message below if she calls.

## 2016-05-22 NOTE — Telephone Encounter (Signed)
OK per Dr Lucia GaskinsAhern for her to come for migraine cocktail. [facility] [place] She can come tomorrow between 1-330pm for infusion.

## 2016-05-22 NOTE — Telephone Encounter (Signed)
Tried calling back patient, mailbox is full, Susan Hopkins can you try her tomorrow?  Thanks.

## 2016-05-23 NOTE — Telephone Encounter (Signed)
Patient is returning a call. °

## 2016-05-23 NOTE — Telephone Encounter (Signed)
She is feeling better, we will see her at her appointment on the 19th.

## 2016-06-03 ENCOUNTER — Ambulatory Visit (INDEPENDENT_AMBULATORY_CARE_PROVIDER_SITE_OTHER): Payer: BLUE CROSS/BLUE SHIELD | Admitting: Neurology

## 2016-06-03 ENCOUNTER — Encounter: Payer: Self-pay | Admitting: Neurology

## 2016-06-03 VITALS — BP 126/78 | HR 75 | Ht 66.0 in | Wt 181.4 lb

## 2016-06-03 DIAGNOSIS — G43009 Migraine without aura, not intractable, without status migrainosus: Secondary | ICD-10-CM | POA: Diagnosis not present

## 2016-06-03 MED ORDER — TRAMADOL HCL 50 MG PO TABS: 50.0000 mg | ORAL_TABLET | Freq: Four times a day (QID) | ORAL | Status: DC | PRN

## 2016-06-03 MED ORDER — PROPRANOLOL HCL 20 MG PO TABS: 20.0000 mg | ORAL_TABLET | Freq: Three times a day (TID) | ORAL | Status: DC

## 2016-06-03 NOTE — Addendum Note (Signed)
Addended by: Naomie DeanAHERN, Accalia Rigdon B on: 06/03/2016 11:40 AM   Modules accepted: Orders

## 2016-06-03 NOTE — Patient Instructions (Signed)
Remember to drink plenty of fluid, eat healthy meals and do not skip any meals. Try to eat protein with a every meal and eat a healthy snack such as fruit or nuts in between meals. Try to keep a regular sleep-wake schedule and try to exercise daily, particularly in the form of walking, 20-30 minutes a day, if you can.   As far as your medications are concerned, I would like to suggest: propranolol 20mg  three times a day  I would like to see you back in 4-6 months, sooner if we need to. Please call us with any interim questions, concerns, problems, updates or refill requests.   Our phone number is [phone]. We also have an after hours call service for urgent matters and there is a physician on-call for urgent questions. For any emergencies you know to call 911 or go to the nearest emergency room

## 2016-06-03 NOTE — Progress Notes (Signed)
[facility]    Provider:  Dr Lucia GaskinsAhern Referring Provider: Pincus LargePhelps, Jazma Y, DO Primary Care Physician:  Caryl AdaJazma Phelps, DO   CC: Migraine  Interval history 05/25/2016: She is on nortriptyline and propranolol and this has helped with the migraines. She is still having chronic migraines 4-5 in the last 2 weeks but she has been fasting and thinks dehydration is affecting. Before this the headaches were better she was having them 1-2x a month. For 3 months she had no headaches at all. Really just an increase in the last few weeks likely due to fasting. She takes cambia. She was sensitive to imitrex and relpax. With the relpax her blood pressure went up and she had jaw pain. She went to the emergency room, tongue swelling. She tried to increase the Nortriptyline and had joint pain. She is on 20mg  mortriptyline. She had side effects to trokendi. She takes phenergan for the headaches. She takes diclofenac 4-5x a week no more (can cause rebound headache)  Interval history 11/28/2015; She has worsening headaches. She is crying today, in the last 3 weeks over 8 migraines. Relpax gave her side effects, chest pain and tightness. She started Amitriptyline, she didn't have a headache for a few months. She was having insomnia and amitrip helped a lot but made her too drowy. Sleep and increased caffeine makes her headaches worse. Yesterday she had a very bad migraine. She has had more than 8 migraines in the last 3 weeks.   HPI: Susan Hopkins is a 45 y.o. female here as a referral from Dr. Jimmey RalphParker for Migraine  Migraines started 7 years ago. Used to have it 1-2x a year. Was worse with pregnancy. This last month she has pain on the right side of the head continuously. Throbbing. endorses Light sensitivity, sound sensitivity. Has wavy light aura. She has them 2x a month. This month she has had it a at a low level all month, always there. She takes tylenol, ibuprofen daily. She has neck and shoulder  muscular pain. Imitrex doesn't work, makes her shoulder sore, hands hurt. She has nausea and vomiting with the headaches. She went back to school this month, that may be exacerbating the headaches. She has a new glasses prescription. She has 4 kids. No more kids, has an IUD. She gets to sleep but everything wakes her up. She can't nap during the day. No snoring. Migraine can be severe. Can be 10/10. 8-10 hours.   Review of Systems: Patient complains of symptoms per HPI as well as the following symptoms: denies CP, SOB. +Leg swelling, restless leg, fatigue Pertinent negatives per HPI. All others negative.   Social History   Social History  . Marital Status: Married    Spouse Name: Roseanne RenoHassan  . Number of Children: 4  . Years of Education: Ba   Occupational History  . [employer]    Social History Main Topics  . Smoking status: Never Smoker   . Smokeless tobacco: Never Used  . Alcohol Use: No  . Drug Use: No  . Sexual Activity: Yes    Birth Control/ Protection: IUD   Other Topics Concern  . Not on file   Social History Narrative   Lives at home with husband and 4 kids.   Right handed.    Caffeine use: Coffee (2-3 cups/day)    Family History  Problem Relation Age of Onset  . Diabetes Mother   . Hypertension Mother   . Hyperlipidemia Mother   . Hypertension Father   .  Hyperlipidemia Father   . Anesthesia problems Neg Hx   . Hypotension Neg Hx   . Malignant hyperthermia Neg Hx   . Pseudochol deficiency Neg Hx     Past Medical History  Diagnosis Date  . ADVANCED MATERNAL AGE 56/02/2008  . DIABETES MELLITUS, GESTATIONAL, HX OF 05/18/2008  . 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 Contact dermatitis 08/27/2011  . 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 Migraine headache     last migraine in Sept 2012  . GERD (gastroesophageal reflux disease)     doesn't take anything for acid reflux  . Gastric ulcer     history of  . Hemorrhoids     . Constipation   . Diarrhea   . ANEMIA, IRON DEFICIENCY, UNSPEC. 02/12/2007    after IUD  . Diabetes mellitus without complication (HCC)     gestational diab. currently & with infant #2  . Gestational diabetes A2/B 07/20/2012    Fetal echo-nml-under media tab, needs baseline labs-nml with nml TSH and 24 hour urine-68mg . To see optho in Nov 2013-Per pt. Ok, serial u/s for growth, 2x/wk testing at 32 wks.   . IUD complication (HCC) 07/28/2015    Past Surgical History  Procedure Laterality Date  . Cesarean section  2005/2010  . Female circumcision  68  . Tonsillectomy  1989  . Dilation and curettage of uterus  2009  . Cholecystectomy  11/28/2011    Procedure: LAPAROSCOPIC CHOLECYSTECTOMY WITH INTRAOPERATIVE CHOLANGIOGRAM;  Surgeon: Wilmon Arms. Corliss Skains, MD;  Location: [facility] OR;  Service: General;  Laterality: N/A;  . Cesarean section N/A 01/22/2013    Procedure: CESAREAN SECTION;  Surgeon: Reva Bores, MD;  Location: [facility];  Service: Obstetrics;  Laterality: N/A;  Repeat  . Cesarean section  2005  . Cesarean section  2010    Current Outpatient Prescriptions  Medication Sig Dispense Refill  . Diclofenac Potassium 50 MG PACK Take 50 mg by mouth once as needed. Take once daily as needed with headache onset. Please take with food 30 each 11  . ferrous sulfate 325 (65 FE) MG tablet Take 1 tablet (325 mg total) by mouth daily with breakfast. 30 tablet 2  . hydrochlorothiazide (HYDRODIURIL) 25 MG tablet Take 0.5 tablets (12.5 mg total) by mouth daily. 45 tablet 1  . indomethacin (INDOCIN) 25 MG capsule Take 25 mg by mouth once as needed. Reported on 05/07/2016    . Multiple Vitamins-Minerals (MULTIVITAMIN WITH MINERALS) tablet Take 1 tablet by mouth daily. Reported on 05/07/2016    . nortriptyline (PAMELOR) 10 MG capsule Take 3 capsules (30 mg total) by mouth at bedtime. 90 capsule 11  . propranolol (INDERAL) 20 MG tablet Take 1 tablet (20 mg total) by mouth 2 (two) times daily. 60 tablet 2  .  Vitamin D, Ergocalciferol, (DRISDOL) 50000 units CAPS capsule Take 1 capsule (50,000 Units total) by mouth every 7 (seven) days. Total course of 8 weeks. 8 capsule 0  . [DISCONTINUED] amitriptyline (ELAVIL) 25 MG tablet Take 1 tablet (25 mg total) by mouth at bedtime. (Patient not taking: Reported on 08/10/2015) 30 tablet 6  . [DISCONTINUED] eletriptan (RELPAX) 40 MG tablet Take 1 tablet (40 mg total) by mouth as needed for migraine. May repeat in 2 hours if headache persists or recurs. (Patient not taking: Reported on 08/10/2015) 8 tablet 0   No current facility-administered medications for this visit.    Allergies as of 06/03/2016 - Review Complete 06/03/2016  Allergen Reaction Noted  .  Relpax [eletriptan] Shortness Of Breath 11/14/2015  . Oxycodone Itching 12/26/2011    Vitals: BP 126/78 mmHg  Pulse 75  Ht 5\' 6"  (1.676 m)  Wt 181 lb 6.4 oz (82.283 kg)  BMI 29.29 kg/m2 Last Weight:  Wt Readings from Last 1 Encounters:  06/03/16 181 lb 6.4 oz (82.283 kg)   Last Height:   Ht Readings from Last 1 Encounters:  06/03/16 5\' 6"  (1.676 m)       Speech:  Speech is normal; fluent and spontaneous with normal comprehension.  Cognition:  The patient is oriented to person, place, and time;  Cranial Nerves:  The pupils are equal, round, and reactive to light. The fundi are normal and spontaneous venous pulsations are present. Visual fields are full to finger confrontation. Extraocular movements are intact. Trigeminal sensation is intact and the muscles of mastication are normal. The face is symmetric. The palate elevates in the midline. Hearing intact. Voice is normal. Shoulder shrug is normal. The tongue has normal motion without fasciculations.    Strength:  Strength is V/V in the upper and lower limbs.      Assessment/Plan: 45 year old female with migraines, worsening.   MRI of the brain was normal Start Trokendi 25mg  then increase to 50mg  - had side effects,  started Nortriptyline and propranolol Cambia for acute management, phenergan for nausea (had tongue swelling with triptans) F/u 3 months Increase propranolol to 20mg  three times a day for any side effects go back to twice a day  Susan Dean, MD  [facility] [address] [place], [place] [address]  Phone [phone] Fax [phone]  A total of 30 minutes was spent face-to-face with this patient. Over half this time was spent on counseling patient on the migraine diagnosis and different therapeutic options available.

## 2016-08-09 ENCOUNTER — Ambulatory Visit (INDEPENDENT_AMBULATORY_CARE_PROVIDER_SITE_OTHER): Payer: BLUE CROSS/BLUE SHIELD | Admitting: Internal Medicine

## 2016-08-09 DIAGNOSIS — B379 Candidiasis, unspecified: Secondary | ICD-10-CM | POA: Diagnosis not present

## 2016-08-09 DIAGNOSIS — N898 Other specified noninflammatory disorders of vagina: Secondary | ICD-10-CM

## 2016-08-09 MED ORDER — FLUCONAZOLE 150 MG PO TABS: ORAL_TABLET | ORAL | 0 refills | Status: DC

## 2016-08-09 NOTE — Patient Instructions (Signed)
Ms. Shirline FreesMohammed,  I have prescribed diflucan for presumed yeast infection.  Take 1 tablet. If symptoms have not improved, you may take another 72 hours later.  Return to clinic if you have abdominal pain or continued symptoms.  Best, Dr. Sampson GoonFitzgerald

## 2016-08-09 NOTE — Progress Notes (Signed)
[facility] Progress Note  Subjective:  Susan Hopkins is a 44-y/[facility].  Vaginal Discharge: - Says she has had thick, cottage cheese like discharge for the past few days with intense itching.  - Tried monistat x 3 days with some improvement only the first day  - Itching worst at night - Says she last had a yeast infection a couple of years ago - Per medical record, patient was seen a few months after the birth of her daughter in 2010 for complaint of vaginal itching and discharge; wet prep was negative for yeast at that time. Patient shared that she had been circumcised as a child during that visit, and NP discussed option of releasing labia, as she noted a painful pocket within labia on exam - Today, patient refuses GU exam or giving a urine sample because she just started her period. - Denies abdominal pain. - Denies concern for STDs ROS: No fevers, no n/v/d  Objective: Blood pressure 132/78, pulse 81, temperature 98 F (36.7 C), temperature source Oral, weight 180 lb (81.6 kg), last menstrual period 08/07/2016. Constitutional: Well-appearing female, in NAD Abdominal: Soft. +BS, NT, ND, no rebound or guarding.  Psychiatric: Normal mood and affect.  Vitals reviewed  Assessment/Plan: Vaginal discharge - Probable yeast infection. Could also be complication of female circumcision. - Prescribed diflucan due to medication's low risk of harm.  - Explained that gynecologic exam and wet prep would be preferred to be able to better assess her complaint, but patient refused. She expressed understanding that she should come back if symptoms do not resolve.  Follow-up at earliest convenience for general check-up or if symptoms do not improve.  Dani GobbleHillary Fitzgerald, MD [facility], PGY-2

## 2016-08-10 DIAGNOSIS — N898 Other specified noninflammatory disorders of vagina: Secondary | ICD-10-CM | POA: Insufficient documentation

## 2016-08-10 NOTE — Assessment & Plan Note (Addendum)
-   Probable yeast infection. Could also be complication of female circumcision. - Prescribed diflucan due to medication's low risk of harm.  - Explained that gynecologic exam and wet prep would be preferred to be able to better assess her complaint, but patient refused. She expressed understanding that she should come back if symptoms do not resolve.

## 2016-10-07 ENCOUNTER — Ambulatory Visit (INDEPENDENT_AMBULATORY_CARE_PROVIDER_SITE_OTHER): Payer: BLUE CROSS/BLUE SHIELD | Admitting: *Deleted

## 2016-10-07 DIAGNOSIS — Z23 Encounter for immunization: Secondary | ICD-10-CM | POA: Diagnosis not present

## 2016-10-15 ENCOUNTER — Other Ambulatory Visit: Payer: Self-pay | Admitting: Family Medicine

## 2016-10-15 DIAGNOSIS — I10 Essential (primary) hypertension: Secondary | ICD-10-CM

## 2016-10-29 ENCOUNTER — Encounter: Payer: Self-pay | Admitting: Student

## 2016-10-29 ENCOUNTER — Ambulatory Visit (INDEPENDENT_AMBULATORY_CARE_PROVIDER_SITE_OTHER): Payer: BLUE CROSS/BLUE SHIELD | Admitting: Student

## 2016-10-29 VITALS — BP 128/70 | HR 80 | Temp 98.2°F | Ht 66.0 in | Wt 183.4 lb

## 2016-10-29 DIAGNOSIS — I10 Essential (primary) hypertension: Secondary | ICD-10-CM | POA: Diagnosis not present

## 2016-10-29 DIAGNOSIS — R7309 Other abnormal glucose: Secondary | ICD-10-CM

## 2016-10-29 DIAGNOSIS — Z111 Encounter for screening for respiratory tuberculosis: Secondary | ICD-10-CM

## 2016-10-29 LAB — BASIC METABOLIC PANEL
BUN: 7 mg/dL (ref 7–25)
CO2: 28 mmol/L (ref 20–31)
CREATININE: 0.45 mg/dL — AB (ref 0.50–1.10)
Calcium: 8.8 mg/dL (ref 8.6–10.2)
Chloride: 105 mmol/L (ref 98–110)
Glucose, Bld: 117 mg/dL — ABNORMAL HIGH (ref 65–99)
Potassium: 3.7 mmol/L (ref 3.5–5.3)
Sodium: 138 mmol/L (ref 135–146)

## 2016-10-29 LAB — POCT GLYCOSYLATED HEMOGLOBIN (HGB A1C): HEMOGLOBIN A1C: 6.4

## 2016-10-29 NOTE — Patient Instructions (Signed)
It was great seeing you today! We have addressed the following issues today  1. Palpitation: I recommend taking your propranolol as prescribed. This will  help with her blood pressure as well.  2.   Hypertension: Goal blood pressure is less than 140/90. Continue taking the medication 3.   Screening for TB: we are doing blood test today.    If we did any lab work today, and the results require attention, either me or my nurse will get in touch with you. If everything is normal, you will get a letter in mail. If you don't hear from us in two weeks, please give us a call. Otherwise, we look forward to seeing you again at your next visit. If you have any questions or concerns before then, please call the clinic at [phone].   Please bring all your medications to every doctors visit   Sign up for My Chart to have easy access to your labs results, and communication with your Primary care physician.     Please check-out at the front desk before leaving the clinic.    Take Care,

## 2016-10-29 NOTE — Assessment & Plan Note (Signed)
QuantiFERON gold today

## 2016-10-29 NOTE — Progress Notes (Signed)
   Subjective:    Patient ID: Susan Hopkins is a 45 y.o. old female.  HPI #Hypertension: On propranolol 20 mg 3 times a day and hydrochlorothiazide 12.5 mg daily. However, she takes her propranolol once a day instead of 3 times a day. She says she has difficulty remembering. Reports palpitation everyday for three to four months.  Denies chest pain, shortness of breath. Reports edema in her legs. She has history of venous insufficiency. Most recent EKG about a year ago which was sinus rhythm. Last TSH about 6 months ago within normal limit.   #TB screening: She request us QuantiFERON goal. 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 She denies recent travel outside the [place] or sick contacts. She denies chest pain, cough, nighttime sweat or unintentional weight loss.   PMH: Hypertension, migraine, venous insufficiency.   SH: Denies smoking cigarettes, drinking alcohol or using recreational drug  Review of Systems Per HPI Objective:   Vitals:   10/29/16 1532 10/29/16 1541  BP: (!) 143/79 128/70  Pulse: 80   Temp: 98.2 F (36.8 C)   TempSrc: Oral   Weight: 183 lb 6.4 oz (83.2 kg)   Height: 5\' 6"  (1.676 m)    GEN: appears well, no apparent distress. CVS: RRR, normal s1 and s2, no murmurs, trace edema RESP: no increased work of breathing, no crackles or wheeze GI: Bowel sounds present and normal, soft, non-tender,non-distended SKIN: No apparent skin lesion NEURO: alert and oriented appropriately, no gross defecits  PSYCH: appropriate mood and affect     Assessment & Plan:  Essential hypertension, benign Well controlled. -BMP today  Screening for tuberculosis QuantiFERON gold today

## 2016-10-29 NOTE — Assessment & Plan Note (Addendum)
Well controlled. Recommended taking her propranolol as prescribed for palpitation.  -BMP today

## 2016-10-30 ENCOUNTER — Ambulatory Visit: Payer: Self-pay

## 2016-10-31 LAB — QUANTIFERON TB GOLD ASSAY (BLOOD)
Interferon Gamma Release Assay: NEGATIVE
Mitogen-Nil: >10 IU/mL
QUANTIFERON NIL VALUE: 0.04 [IU]/mL
Quantiferon Tb Ag Minus Nil Value: 0 IU/mL

## 2016-11-05 ENCOUNTER — Telehealth: Payer: Self-pay | Admitting: Obstetrics and Gynecology

## 2016-11-05 ENCOUNTER — Telehealth: Payer: Self-pay | Admitting: Student

## 2016-11-05 ENCOUNTER — Encounter: Payer: Self-pay | Admitting: Student

## 2016-11-05 NOTE — Telephone Encounter (Signed)
Called patient to discuss her test results from her recent visits. QuantiFERON gold and BMP within normal limits. A1c 6.4. Advised patient to follow-up with PCP on this. I have also printed lab results and left at the front desk for patient to pick up per patient's request.

## 2016-11-05 NOTE — Telephone Encounter (Signed)
Pt would like someone to call her about recent test results (tb I believe) and would also like them printed out and left upfront to be picked up. Please advise. Thanks! ep

## 2016-11-05 NOTE — Telephone Encounter (Signed)
Will forward to Dr. Alanda SlimGonfa who saw patient for most recent labs.  Aliceson Dolbow,CMA

## 2016-11-26 ENCOUNTER — Encounter: Payer: Self-pay | Admitting: Obstetrics and Gynecology

## 2016-11-26 ENCOUNTER — Ambulatory Visit (INDEPENDENT_AMBULATORY_CARE_PROVIDER_SITE_OTHER): Payer: BLUE CROSS/BLUE SHIELD | Admitting: Obstetrics and Gynecology

## 2016-11-26 VITALS — BP 118/78 | HR 84 | Temp 99.0°F | Wt 180.0 lb

## 2016-11-26 DIAGNOSIS — J029 Acute pharyngitis, unspecified: Secondary | ICD-10-CM | POA: Diagnosis not present

## 2016-11-26 LAB — POCT RAPID STREP A (OFFICE): RAPID STREP A SCREEN: NEGATIVE

## 2016-11-26 NOTE — Progress Notes (Signed)
   Subjective:   Patient ID: Susan Hopkins, female    DOB: 12-16-1971, 45 y.o.   MRN: [id number]  Patient presents for Same Day Appointment  Chief Complaint  Patient presents with  . Sore Throat    HPI:  Susan Ceciluha Goatley is a 45 y.o. female presenting with a sore throat for 3 days.  Daughter recently diagnosed with strep throat last week.   Associated symptoms include: ear pain, headache, and muscle aches.  Symptoms are intermittent.  Severity 8  Home treatment thus far includes:  rest and NSAIDS/acetaminophen. Still in pain.   Last saw a dentist 2-3 years ago   There is no history of similar symptoms.  Symptoms Fever: no Cough: no Runny nose: no Muscle aches: no Swollen Glands: no Trouble breathing: no Drooling: no Rash: no  Review of Systems   See HPI for ROS.    History  Smoking Status  . Never Smoker  Smokeless Tobacco  . Never Used   Past medical history, surgical, family, and social history reviewed and updated in the EMR as appropriate.  Objective:  BP 118/78   Pulse 84   Temp 99 F (37.2 C) (Oral)   Wt 180 lb (81.6 kg)   BMI 29.05 kg/m  Vitals and nursing note reviewed  Physical Exam Constitutional: well-appearing, NAD HEENT: MMM, o/p clear and without erythema or exudate, TMs bilaterally normal, EOM intant, Conj nl Neck: no lymphadenopathy, supple, nml ROM, no masses Skin: no rashes  Results for orders placed or performed in visit on 11/26/16 (from the past 24 hour(s))  POCT rapid strep A     Status: None   Collection Time: 11/26/16  2:10 PM  Result Value Ref Range   Rapid Strep A Screen Negative Negative     Assessment & Plan:  1. Sore throat Sick contact with strep throat. Her rapid strep test was negative. Most likely a viral illness causing sore throat. No fevers. Patient well-appearing and well-hydrated. No red flags such as rash, difficulty breathing or swallowing. Conservative management at this time to include rest, hydration, OTC  lozenges, etc. Handout given. Return precautions discussed.  - POCT rapid strep A   Caryl AdaJazma Blimi Godby, DO 11/26/2016, 2:08 PM PGY-3, [facility]

## 2016-11-26 NOTE — Patient Instructions (Signed)
Sore Throat When you have a sore throat, your throat may:  Hurt.  Burn.  Feel irritated.  Feel scratchy. Many things can cause a sore throat, including:  An infection.  Allergies.  Dryness in the air.  Smoke or pollution.  Gastroesophageal reflux disease (GERD).  A tumor. A sore throat can be the first sign of another sickness. It can happen with other problems, like coughing or a fever. Most sore throats go away without treatment. Follow these instructions at home:  Take over-the-counter medicines only as told by your doctor.  Drink enough fluids to keep your pee (urine) clear or pale yellow.  Rest when you feel you need to.  To help with pain, try:  Sipping warm liquids, such as broth, herbal tea, or warm water.  Eating or drinking cold or frozen liquids, such as frozen ice pops.  Gargling with a salt-water mixture 3-4 times a day or as needed. To make a salt-water mixture, add -1 tsp of salt in 1 cup of warm water. Mix it until you cannot see the salt anymore.  Sucking on hard candy or throat lozenges.  Putting a cool-mist humidifier in your bedroom at night.  Sitting in the bathroom with the door closed for 5-10 minutes while you run hot water in the shower.  Do not use any tobacco products, such as cigarettes, chewing tobacco, and e-cigarettes. If you need help quitting, ask your doctor. Contact a doctor if:  You have a fever for more than 2-3 days.  You keep having symptoms for more than 2-3 days.  Your throat does not get better in 7 days.  You have a fever and your symptoms suddenly get worse. Get help right away if:  You have trouble breathing.  You cannot swallow fluids, soft foods, or your saliva.  You have swelling in your throat or neck that gets worse.  You keep feeling like you are going to throw up (vomit).  You keep throwing up. This information is not intended to replace advice given to you by your health care provider. Make sure  you discuss any questions you have with your health care provider. Document Released: 09/10/2008 Document Revised: 07/28/2016 Document Reviewed: 09/22/2015 Elsevier Interactive Patient Education  2017 Elsevier Inc.  

## 2016-12-03 ENCOUNTER — Ambulatory Visit: Payer: BLUE CROSS/BLUE SHIELD | Admitting: Neurology

## 2016-12-06 ENCOUNTER — Telehealth: Payer: Self-pay | Admitting: Neurology

## 2016-12-06 MED ORDER — PROMETHAZINE HCL 25 MG PO TABS: 25.0000 mg | ORAL_TABLET | Freq: Three times a day (TID) | ORAL | 0 refills | Status: DC | PRN

## 2016-12-06 NOTE — Telephone Encounter (Signed)
Patient has been having more HAs, behind eye, throbbing, nausea.  Pt had to cancel appt for 12/19, now scheduled for Feb.  Dr. Lucia GaskinsAhern, if appropriate, please see patient sooner, may be able to see NP? Patient was advised to go to UC if ongoing frequent and more severe HAs, or ER.  She requested phenergan Rx, which was sent to her pharm.

## 2016-12-07 NOTE — Telephone Encounter (Signed)
Kara Meadmma, can you schedule with megan please? thanks

## 2016-12-11 NOTE — Telephone Encounter (Signed)
LVM for pt to call and schedule sooner f/u with MM,NP per AA,MD.   * Please schedule if pt calls back, thank you

## 2016-12-12 NOTE — Telephone Encounter (Signed)
Called and spoke to pt. R/s for earlier appt with MM,NP on 12/19/16 at 1pm, check in 12:30pm. Pt agreeable to see NP. Advised her to bring updated insurance cards, copay, and med list with her. She verbalized understanding.

## 2016-12-17 ENCOUNTER — Telehealth: Payer: Self-pay | Admitting: Diagnostic Neuroimaging

## 2016-12-17 NOTE — Telephone Encounter (Signed)
Pt called in for HA mgmt via answering service. I called pt back x 2; no answer. -VRP

## 2016-12-17 NOTE — Telephone Encounter (Signed)
Patient has an appointment on 12/20/2015 with Susan Hopkins. thanks

## 2016-12-17 NOTE — Telephone Encounter (Signed)
Pt called in. Having more and more headache. Also taking daily diclofenac and phenergan. Has appt this Thursday to discuss other options. Advised to keep appt and I will forward message to Dr. Lucia GaskinsAhern. -VRP

## 2016-12-18 NOTE — Telephone Encounter (Signed)
Called and LVM for pt to call back. AA,MD had cancellation tomorrow at 1:30pm. Asked for her to call back and let us know if she would prefer to see MD instead. Gave [facility] phone number.  Let me know and I can schedule her.

## 2016-12-18 NOTE — Telephone Encounter (Signed)
Patient called back and spoke to phone staff. She would like to see AA,MD instead. Scheduled with AA,MD at 1:30pm, check in 1:00pm. cx appt with MM,NP tomorrow.

## 2016-12-19 ENCOUNTER — Ambulatory Visit (INDEPENDENT_AMBULATORY_CARE_PROVIDER_SITE_OTHER): Payer: BLUE CROSS/BLUE SHIELD | Admitting: Neurology

## 2016-12-19 ENCOUNTER — Ambulatory Visit: Payer: Self-pay | Admitting: Adult Health

## 2016-12-19 ENCOUNTER — Encounter: Payer: Self-pay | Admitting: Neurology

## 2016-12-19 VITALS — BP 113/73 | HR 80 | Ht 66.0 in | Wt 189.0 lb

## 2016-12-19 DIAGNOSIS — G43109 Migraine with aura, not intractable, without status migrainosus: Secondary | ICD-10-CM | POA: Diagnosis not present

## 2016-12-19 MED ORDER — TRAMADOL HCL 50 MG PO TABS: 50.0000 mg | ORAL_TABLET | Freq: Four times a day (QID) | ORAL | 5 refills | Status: DC | PRN

## 2016-12-19 MED ORDER — PROPRANOLOL HCL ER 60 MG PO CP24: 60.0000 mg | ORAL_CAPSULE | Freq: Every day | ORAL | 12 refills | Status: DC

## 2016-12-19 MED ORDER — METHYLPREDNISOLONE 4 MG PO TBPK: ORAL_TABLET | ORAL | 3 refills | Status: DC

## 2016-12-19 MED ORDER — NORTRIPTYLINE HCL 10 MG PO CAPS: 30.0000 mg | ORAL_CAPSULE | Freq: Every day | ORAL | 11 refills | Status: DC

## 2016-12-19 NOTE — Patient Instructions (Addendum)
Remember to drink plenty of fluid, eat healthy meals and do not skip any meals. Try to eat protein with a every meal and eat a healthy snack such as fruit or nuts in between meals. Try to keep a regular sleep-wake schedule and try to exercise daily, particularly in the form of walking, 20-30 minutes a day, if you can.   As far as your medications are concerned, I would like to suggest:  Diclofenac, Tramadol and Phenergan for acute headache management Take Propranolol 60mg  at night only (this is an extended release pill) Nortriptyline 30mg  at night Steroids: Medrol dosepak for 6 days: Take pills every morning with food.  I would like to see you back in 4-6 months, sooner if we need to. Please call us with any interim questions, concerns, problems, updates or refill requests.   Our phone number is [phone]. We also have an after hours call service for urgent matters and there is a physician on-call for urgent questions. For any emergencies you know to call 911 or go to the nearest emergency room  Methylprednisolone tablets What is this medicine? METHYLPREDNISOLONE (meth ill pred NISS oh lone) is a corticosteroid. It is commonly used to treat inflammation of the skin, joints, lungs, and other organs. Common conditions treated include asthma, allergies, and arthritis. It is also used for other conditions, such as blood disorders and diseases of the adrenal glands. This medicine may be used for other purposes; ask your health care provider or pharmacist if you have questions. COMMON BRAND NAME(S): Medrol, Medrol Dosepak What should I tell my health care provider before I take this medicine? They need to know if you have any of these conditions: -Cushing's syndrome -eye disease, vision problems -diabetes -glaucoma -heart disease -high blood pressure -infection (especially a virus infection such as chickenpox, cold sores, or herpes) -liver disease -mental illness -myasthenia  gravis -osteoporosis -recently received or scheduled to receive a vaccine -seizures -stomach or intestine problems -thyroid disease -an unusual or allergic reaction to lactose, methylprednisolone, other medicines, foods, dyes, or preservatives -pregnant or trying to get pregnant -breast-feeding How should I use this medicine? Take this medicine by mouth with a glass of water. Follow the directions on the prescription label. Take this medicine with food. If you are taking this medicine once a day, take it in the morning. Do not take it more often than directed. Do not suddenly stop taking your medicine because you may develop a severe reaction. Your doctor will tell you how much medicine to take. If your doctor wants you to stop the medicine, the dose may be slowly lowered over time to avoid any side effects. Talk to your pediatrician regarding the use of this medicine in children. Special care may be needed. Overdosage: If you think you have taken too much of this medicine contact a poison control center or emergency room at once. NOTE: This medicine is only for you. Do not share this medicine with others. What if I miss a dose? If you miss a dose, take it as soon as you can. If it is almost time for your next dose, talk to your doctor or health care professional. You may need to miss a dose or take an extra dose. Do not take double or extra doses without advice. What may interact with this medicine? Do not take this medicine with any of the following medications: -alefacept -echinacea -live virus vaccines -metyrapone -mifepristone This medicine may also interact with the following medications: -amphotericin B -aspirin and  aspirin-like medicines -certain antibiotics like erythromycin, clarithromycin, troleandomycin -certain medicines for diabetes -certain medicines for fungal infections like ketoconazole -certain medicines for seizures like carbamazepine, phenobarbital,  phenytoin -certain medicines that treat or prevent blood clots like warfarin -cholestyramine -cyclosporine -digoxin -diuretics -female hormones, like estrogens and birth control pills -isoniazid -NSAIDs, medicines for pain inflammation, like ibuprofen or naproxen -other medicines for myasthenia gravis -rifampin -vaccines This list may not describe all possible interactions. Give your health care provider a list of all the medicines, herbs, non-prescription drugs, or dietary supplements you use. Also tell them if you smoke, drink alcohol, or use illegal drugs. Some items may interact with your medicine. What should I watch for while using this medicine? Tell your doctor or healthcare professional if your symptoms do not start to get better or if they get worse. Do not stop taking except on your doctor's advice. You may develop a severe reaction. Your doctor will tell you how much medicine to take. This medicine may increase your risk of getting an infection. Tell your doctor or health care professional if you are around anyone with measles or chickenpox, or if you develop sores or blisters that do not heal properly. This medicine may affect blood sugar levels. If you have diabetes, check with your doctor or health care professional before you change your diet or the dose of your diabetic medicine. Tell your doctor or health care professional right away if you have any change in your eyesight. Using this medicine for a long time may increase your risk of low bone mass. Talk to your doctor about bone health. What side effects may I notice from receiving this medicine? Side effects that you should report to your doctor or health care professional as soon as possible: -allergic reactions like skin rash, itching or hives, swelling of the face, lips, or tongue -bloody or tarry stools -changes in vision -hallucination, loss of contact with reality -muscle cramps -muscle pain -palpitations -signs  and symptoms of high blood sugar such as dizziness; dry mouth; dry skin; fruity breath; nausea; stomach pain; increased hunger or thirst; increased urination -signs and symptoms of infection like fever or chills; cough; sore throat; pain or trouble passing urine -trouble passing urine or change in the amount of urine Side effects that usually do not require medical attention (report to your doctor or health care professional if they continue or are bothersome): -changes in emotions or mood -constipation -diarrhea -excessive hair growth on the face or body -headache -nausea, vomiting -trouble sleeping -weight gain This list may not describe all possible side effects. Call your doctor for medical advice about side effects. You may report side effects to FDA at 1-800-FDA-1088. Where should I keep my medicine? Keep out of the reach of children. Store at room temperature between 20 and 25 degrees C (68 and 77 degrees F). Throw away any unused medicine after the expiration date. NOTE: This sheet is a summary. It may not cover all possible information. If you have questions about this medicine, talk to your doctor, pharmacist, or health care provider.  2017 Elsevier/Gold Standard (2016-02-08 15:53:30)  Propranolol extended-release capsules What is this medicine? PROPRANOLOL (proe PRAN oh lole) is a beta-blocker. Beta-blockers reduce the workload on the heart and help it to beat more regularly. This medicine is used to treat high blood pressure, heart muscle disease, and prevent chest pain caused by angina. It is also used to prevent migraine headaches. You should not use this medicine to treat a migraine that  has already started. This medicine may be used for other purposes; ask your health care provider or pharmacist if you have questions. COMMON BRAND NAME(S): Inderal LA, Inderal XL, InnoPran XL What should I tell my health care provider before I take this medicine? They need to know if you have  any of these conditions: -circulation problems, or blood vessel disease -diabetes -history of heart attack or heart disease, vasospastic angina -kidney disease -liver disease -lung or breathing disease, like asthma or emphysema -pheochromocytoma -slow heart rate -thyroid disease -an unusual or allergic reaction to propranolol, other beta-blockers, medicines, foods, dyes, or preservatives -pregnant or trying to get pregnant -breast-feeding How should I use this medicine? Take this medicine by mouth with a glass of water. Follow the directions on the prescription label. Do not crush or chew. Take your doses at regular intervals. Do not take your medicine more often than directed. Do not stop taking except on the advice of your doctor or health care professional. Talk to your pediatrician regarding the use of this medicine in children. Special care may be needed. Overdosage: If you think you have taken too much of this medicine contact a poison control center or emergency room at once. NOTE: This medicine is only for you. Do not share this medicine with others. What if I miss a dose? If you miss a dose, take it as soon as you can. If it is almost time for your next dose, take only that dose. Do not take double or extra doses. What may interact with this medicine? Do not take this medicine with any of the following medications: -feverfew -phenothiazines like chlorpromazine, mesoridazine, prochlorperazine, thioridazine This medicine may also interact with the following medications: -aluminum hydroxide gel -antipyrine -antiviral medicines for HIV or AIDS -barbiturates like phenobarbital -certain medicines for blood pressure, heart disease, irregular heart beat -cimetidine -ciprofloxacin -diazepam -fluconazole -haloperidol -isoniazid -medicines for cholesterol like cholestyramine or colestipol -medicines for mental depression -medicines for migraine headache like almotriptan,  eletriptan, frovatriptan, naratriptan, rizatriptan, sumatriptan, zolmitriptan -NSAIDs, medicines for pain and inflammation, like ibuprofen or naproxen -phenytoin -rifampin -teniposide -theophylline -thyroid medicines -tolbutamide -warfarin -zileuton This list may not describe all possible interactions. Give your health care provider a list of all the medicines, herbs, non-prescription drugs, or dietary supplements you use. Also tell them if you smoke, drink alcohol, or use illegal drugs. Some items may interact with your medicine. What should I watch for while using this medicine? Visit your doctor or health care professional for regular check ups. Contact your doctor right away if your symptoms worsen. Check your blood pressure and pulse rate regularly. Ask your health care professional what your blood pressure and pulse rate should be, and when you should contact them. Do not stop taking this medicine suddenly. This could lead to serious heart-related effects. You may get drowsy or dizzy. Do not drive, use machinery, or do anything that needs mental alertness until you know how this drug affects you. Do not stand or sit up quickly, especially if you are an older patient. This reduces the risk of dizzy or fainting spells. Alcohol can make you more drowsy and dizzy. Avoid alcoholic drinks. This medicine can affect blood sugar levels. If you have diabetes, check with your doctor or health care professional before you change your diet or the dose of your diabetic medicine. Do not treat yourself for coughs, colds, or pain while you are taking this medicine without asking your doctor or health care professional for advice. Some ingredients  may increase your blood pressure. What side effects may I notice from receiving this medicine? Side effects that you should report to your doctor or health care professional as soon as possible: -allergic reactions like skin rash, itching or hives, swelling of the  face, lips, or tongue -breathing problems -changes in blood sugar -cold hands or feet -difficulty sleeping, nightmares -dry peeling skin -hallucinations -muscle cramps or weakness -slow heart rate -swelling of the legs and ankles -vomiting Side effects that usually do not require medical attention (report to your doctor or health care professional if they continue or are bothersome): -change in sex drive or performance -diarrhea -dry sore eyes -hair loss -nausea -weak or tired This list may not describe all possible side effects. Call your doctor for medical advice about side effects. You may report side effects to FDA at 1-800-FDA-1088. Where should I keep my medicine? Keep out of the reach of children. Store at room temperature between 15 and 30 degrees C (59 and 86 degrees F). Protect from light, moisture and freezing. Keep container tightly closed. Throw away any unused medicine after the expiration date. NOTE: This sheet is a summary. It may not cover all possible information. If you have questions about this medicine, talk to your doctor, pharmacist, or health care provider.  2017 Elsevier/Gold Standard (2013-08-06 14:58:56)

## 2016-12-19 NOTE — Progress Notes (Signed)
[facility]    Provider:  Dr Lucia GaskinsAhern Referring Provider: Pincus LargePhelps, Jazma Y, DO Primary Care Physician:  Caryl AdaJazma Phelps, DO  CC: Migraine  Interval history 12/19/2016: Patient is here for follow-up of chronic migraines. She is on nortriptyline and propranolol. Topamax was tried in the past the patient had side effects. Propranolol was increased at last appointment to 60 mg a day. We tried to increase the nortriptyline in the past but had joint pain. She had side effects to trip him. She takes Phenergan for headaches and diclofenac, I have discussed rebound headache and need to limit these. Relpax gave her chest pain and tightness. Amitriptyline made her too drowsy increased caffeine makes her headaches worse. Imitrex caused her neck and shoulder pain. Patient's headaches are described as throbbing, light sensitivity, sound sensitivity, neck and shoulder muscular pain, mostly on the right but can be on the left. She has been sleeping more and drinking caffeine which are triggers. She is fine today, no headache but has been suffering on and off for the last 3 weeks with migraines. They start after having coffee, would switch to decaf. And she has pain behind the eyes. She is having pain right now.   She was doing well for 4-5 months since last being seen. She started having a headache 3 weeks ago, she is having a lot of aura (does not always have an aura). 3 weeks ago had migraines all weekend. She has increased her Nortriptyline to 3 pills at night and propranolol to 20mg  tid. Headaches for the last 5 days as well.   Medications tried: Nortriptyline, propranolol, Topamax and triptan day, Relpax, amitriptyline, Imitrex, Phenergan, diclofenac.  Interval history 05/25/2016: She is on nortriptyline and propranolol and this has helped with the migraines. She is still having chronic migraines 4-5 in the last 2 weeks but she has been fasting and thinks dehydration is affecting. Before this the  headaches were better she was having them 1-2x a month. For 3 months she had no headaches at all. Really just an increase in the last few weeks likely due to fasting. She takes cambia. She was sensitive to imitrex and relpax. With the relpax her blood pressure went up and she had jaw pain. She went to the emergency room, tongue swelling. She tried to increase the Nortriptyline and had joint pain. She is on 20mg  mortriptyline. She had side effects to trokendi. She takes phenergan for the headaches. She takes diclofenac 4-5x a week no more (can cause rebound headache)  Interval history 11/28/2015; She has worsening headaches. She is crying today, in the last 3 weeks over 8 migraines. Relpax gave her side effects, chest pain and tightness. She started Amitriptyline, she didn't have a headache for a few months. She was having insomnia and amitrip helped a lot but made her too drowy. Sleep and increased caffeine makes her headaches worse. Yesterday she had a very bad migraine. She has had more than 8 migraines in the last 3 weeks.   HPI: Susan Hopkins is a 46 y.o. female here as a referral from Dr. Jimmey RalphParker for Migraine  Migraines started 7 years ago. Used to have it 1-2x a year. Was worse with pregnancy. This last month she has pain on the right side of the head continuously. Throbbing. endorses Light sensitivity, sound sensitivity. Has wavy light aura. She has them 2x a month. This month she has had it a at a low level all month, always there. She takes tylenol, ibuprofen daily. She has  neck and shoulder muscular pain. Imitrex doesn't work, makes her shoulder sore, hands hurt. She has nausea and vomiting with the headaches. She went back to school this month, that may be exacerbating the headaches. She has a new glasses prescription. She has 4 kids. No more kids, has an IUD. She gets to sleep but everything wakes her up. She can't nap during the day. No snoring. Migraine can be severe. Can be 10/10. 8-10  hours.   Review of Systems: Patient complains of symptoms per HPI as well as the following symptoms: denies CP, SOB. +Leg swelling, restless leg, fatigue Pertinent negatives per HPI. All others negative.   Social History   Social History  . Marital status: Married    Spouse name: [name]  . Number of children: 4  . Years of education: Ba   Occupational History  . [employer]    Social History Main Topics  . Smoking status: Never Smoker  . Smokeless tobacco: Never Used  . Alcohol use No  . Drug use: No  . Sexual activity: Yes    Birth control/ protection: IUD   Other Topics Concern  . Not on file   Social History Narrative   Lives at home with husband and 4 kids.   Right handed.    Caffeine use: Coffee (2-3 cups/day)    Family History  Problem Relation Age of Onset  . Diabetes Mother   . Hypertension Mother   . Hyperlipidemia Mother   . Hypertension Father   . Hyperlipidemia Father   . Anesthesia problems Neg Hx   . Hypotension Neg Hx   . Malignant hyperthermia Neg Hx   . Pseudochol deficiency Neg Hx     Past Medical History:  Diagnosis Date  . ADVANCED MATERNAL AGE 88/02/2008  . ANEMIA, IRON DEFICIENCY, UNSPEC. 02/12/2007   after IUD  . Constipation   . Contact dermatitis 08/27/2011  . Diabetes mellitus without complication (HCC)    gestational diab. currently & with infant #2  . DIABETES MELLITUS, GESTATIONAL, HX OF 05/18/2008  . Diarrhea   . 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 Gastric ulcer    history of  . GERD (gastroesophageal reflux disease)    doesn't take anything for acid reflux  . Gestational diabetes A2/B 07/20/2012   Fetal echo-nml-under media tab, needs baseline labs-nml with nml TSH and 24 hour urine-68mg . To see optho in Nov 2013-Per pt. Ok, serial u/s for growth, 2x/wk testing at 32 wks.   . Hemorrhoids   . 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 IUD complication (HCC) 07/28/2015  . Migraine  headache    last migraine in Sept 2012    Past Surgical History:  Procedure Laterality Date  . CESAREAN SECTION  2005/2010  . CESAREAN SECTION N/A 01/22/2013   Procedure: CESAREAN SECTION;  Surgeon: Reva Bores, MD;  Location: [facility];  Service: Obstetrics;  Laterality: N/A;  Repeat  . CESAREAN SECTION  2005  . CESAREAN SECTION  2010  . CHOLECYSTECTOMY  11/28/2011   Procedure: LAPAROSCOPIC CHOLECYSTECTOMY WITH INTRAOPERATIVE CHOLANGIOGRAM;  Surgeon: Wilmon Arms. Corliss Skains, MD;  Location: [facility];  Service: General;  Laterality: N/A;  . DILATION AND CURETTAGE OF UTERUS  2009  . female circumcision  11  . TONSILLECTOMY  1989    Current Outpatient Prescriptions  Medication Sig Dispense Refill  . Diclofenac Potassium 50 MG PACK Take 50 mg by mouth once as needed. Take once daily as needed with headache onset. Please take  with food 30 each 11  . ferrous sulfate 325 (65 FE) MG tablet Take 1 tablet (325 mg total) by mouth daily with breakfast. 30 tablet 2  . hydrochlorothiazide (HYDRODIURIL) 25 MG tablet Take 0.5 tablets (12.5 mg total) by mouth daily. 45 tablet 6  . Multiple Vitamins-Minerals (MULTIVITAMIN WITH MINERALS) tablet Take 1 tablet by mouth daily. Reported on 05/07/2016    . nortriptyline (PAMELOR) 10 MG capsule Take 3 capsules (30 mg total) by mouth at bedtime. 90 capsule 11  . promethazine (PHENERGAN) 25 MG tablet Take 1 tablet (25 mg total) by mouth every 8 (eight) hours as needed for nausea or vomiting. 30 tablet 0  . propranolol (INDERAL) 20 MG tablet Take 1 tablet (20 mg total) by mouth 3 (three) times daily. 90 tablet 11  . traMADol (ULTRAM) 50 MG tablet Take 1 tablet (50 mg total) by mouth every 6 (six) hours as needed. 30 tablet 5   No current facility-administered medications for this visit.     Allergies as of 12/19/2016 - Review Complete 12/19/2016  Allergen Reaction Noted  . Relpax [eletriptan] Shortness Of Breath 11/14/2015  . Oxycodone Itching 12/26/2011     Vitals: BP 113/73 (BP Location: Right Arm, Patient Position: Sitting, Cuff Size: Normal)   Pulse 80   Ht 5\' 6"  (1.676 m)   Wt 189 lb (85.7 kg)   BMI 30.51 kg/m  Last Weight:  Wt Readings from Last 1 Encounters:  12/19/16 189 lb (85.7 kg)   Last Height:   Ht Readings from Last 1 Encounters:  12/19/16 5\' 6"  (1.676 m)     Speech:  Speech is normal; fluent and spontaneous with normal comprehension.  Cognition:  The patient is oriented to person, place, and time;  Cranial Nerves:  The pupils are equal, round, and reactive to light. The fundi are normal and spontaneous venous pulsations are present. Visual fields are full to finger confrontation. Extraocular movements are intact. Trigeminal sensation is intact and the muscles of mastication are normal. The face is symmetric. The palate elevates in the midline. Hearing intact. Voice is normal. Shoulder shrug is normal. The tongue has normal motion without fasciculations.    Strength:  Strength is V/V in the upper and lower limbs.      Assessment/Plan: 46 year old female with migraines  MRI of the brain was normal Diclofenac and tramadol for acute management, phenergan for nausea (had tongue swelling with triptans) Change propranol to 60mg  at night ER Continue Nortriptyline 30mg  qhs Medrol dosepak for one week for increased frequency last 3 weeks Discussed botox for migraine Other medications we can try are Depakote or Cymbalta  Discussed: To prevent or relieve headaches, try the following: Cool Compress. Lie down and place a cool compress on your head.  Avoid headache triggers. If certain foods or odors seem to have triggered your migraines in the past, avoid them. A headache diary might help you identify triggers.  Include physical activity in your daily routine. Try a daily walk or other moderate aerobic exercise.  Manage stress. Find healthy ways to cope with the stressors, such as delegating tasks on  your to-do list.  Practice relaxation techniques. Try deep breathing, yoga, massage and visualization.  Eat regularly. Eating regularly scheduled meals and maintaining a healthy diet might help prevent headaches. Also, drink plenty of fluids.  Follow a regular sleep schedule. Sleep deprivation might contribute to headaches Consider biofeedback. With this mind-body technique, you learn to control certain bodily functions - such as muscle  tension, heart rate and blood pressure - to prevent headaches or reduce headache pain.    Proceed to emergency room if you experience new or worsening symptoms or symptoms do not resolve, if you have new neurologic symptoms or if headache is severe, or for any concerning symptom.    Naomie Dean, MD  [facility] [address] [place], [place] [address]  Phone [phone] Fax [phone]  A total of 40 minutes was spent face-to-face with this patient. Over half this time was spent on counseling patient on the migraine diagnosis and different therapeutic options available.

## 2017-01-22 ENCOUNTER — Ambulatory Visit: Payer: BLUE CROSS/BLUE SHIELD | Admitting: Neurology

## 2017-02-26 ENCOUNTER — Other Ambulatory Visit: Payer: Self-pay | Admitting: Neurology

## 2017-05-20 ENCOUNTER — Encounter: Payer: Self-pay | Admitting: Neurology

## 2017-05-20 ENCOUNTER — Ambulatory Visit (INDEPENDENT_AMBULATORY_CARE_PROVIDER_SITE_OTHER): Payer: BLUE CROSS/BLUE SHIELD | Admitting: Neurology

## 2017-05-20 VITALS — BP 118/79 | HR 74 | Ht 66.0 in | Wt 179.0 lb

## 2017-05-20 DIAGNOSIS — G43109 Migraine with aura, not intractable, without status migrainosus: Secondary | ICD-10-CM

## 2017-05-20 MED ORDER — NORTRIPTYLINE HCL 10 MG PO CAPS: 30.0000 mg | ORAL_CAPSULE | Freq: Every day | ORAL | 11 refills | Status: DC

## 2017-05-20 MED ORDER — PROPRANOLOL HCL ER 60 MG PO CP24: 60.0000 mg | ORAL_CAPSULE | Freq: Every day | ORAL | 12 refills | Status: DC

## 2017-05-20 MED ORDER — TRAMADOL HCL 50 MG PO TABS: 50.0000 mg | ORAL_TABLET | Freq: Four times a day (QID) | ORAL | 5 refills | Status: DC | PRN

## 2017-05-20 NOTE — Patient Instructions (Addendum)
Remember to drink plenty of fluid, eat healthy meals and do not skip any meals. Try to eat protein with a every meal and eat a healthy snack such as fruit or nuts in between meals. Try to keep a regular sleep-wake schedule and try to exercise daily, particularly in the form of walking, 20-30 minutes a day, if you can.   As far as your medications are concerned, I would like to suggest: Cambia at onset of migraine aura. May take with Phenergan.   I would like to see you back in 6-9 months, sooner if we need to. Please call us with any interim questions, concerns, problems, updates or refill requests.   Our phone number is [phone]. We also have an after hours call service for urgent matters and there is a physician on-call for urgent questions. For any emergencies you know to call 911 or go to the nearest emergency room  Botox for migraine Erenumab (Aimovig)

## 2017-05-20 NOTE — Progress Notes (Signed)
[facility]  Provider:  Dr Lucia GaskinsAhern Referring Provider: Pincus LargePhelps, Jazma Y, DO Primary Care Physician:  Caryl AdaJazma Phelps, DO  CC: Migraine  Interval 05/20/2017:  Patient returns today for follow-up of migraines on nortriptyline and propranolol. Had side effects to Topamax. We were unable to increase her nortriptyline due to side effects. She takes Phenergan and diclofenac for acute management. In one month she has 2-3 migraines and if she takes the diclofenac and phenergan during the aura she does not get the migraines and she sleeps well and she is fine. She is doing well on the Nortriptyline and propranolol. She has tiredness to the nortriptyline, slowly titrate off. Every week go down a pill. Will stay on the propranolol. Will try Cambia instead of diclofenac.  Interval history 12/19/2016: Patient is here for follow-up of chronic migraines. She is on nortriptyline and propranolol. Topamax was tried in the past the patient had side effects. Propranolol was increased at last appointment to 60 mg a day. We tried to increase the nortriptyline in the past but had joint pain. She had side effects to trip him. She takes Phenergan for headaches and diclofenac, I have discussed rebound headache and need to limit these. Relpax gave her chest pain and tightness. Amitriptyline made her too drowsy increased caffeine makes her headaches worse. Imitrex caused her neck and shoulder pain. Patient's headaches are described as throbbing, light sensitivity, sound sensitivity, neck and shoulder muscular pain, mostly on the right but can be on the left. She has been sleeping more and drinking caffeine which are triggers. She is fine today, no headache but has been suffering on and off for the last 3 weeks with migraines. They start after having coffee, would switch to decaf. And she has pain behind the eyes. She is having pain right now.   She was doing well for 4-5 months since last being seen. She started  having a headache 3 weeks ago, she is having a lot of aura (does not always have an aura). 3 weeks ago had migraines all weekend. She has increased her Nortriptyline to 3 pills at night and propranolol to 20mg  tid. Headaches for the last 5 days as well.   Medications tried: Nortriptyline(on low dose could not increase doe), Amitriptyline(too sleepy), propranolol(on ER currently), Topamax, Relpax(chest pain and tightness), amitriptyline, Imitrex, Phenergan and diclofenac for acute management.  Interval history 05/25/2016: She is on nortriptyline and propranolol and this has helped with the migraines. She is still having chronic migraines 4-5 in the last 2 weeks but she has been fasting and thinks dehydration is affecting. Before this the headaches were better she was having them 1-2x a month. For 3 months she had no headaches at all. Really just an increase in the last few weeks likely due to fasting. She takes cambia. She was sensitive to imitrex and relpax. With the relpax her blood pressure went up and she had jaw pain. She went to the emergency room, tongue swelling. She tried to increase the Nortriptyline and had joint pain. She is on 20mg  mortriptyline. She had side effects to trokendi. She takes phenergan for the headaches. She takes diclofenac 4-5x a week no more (can cause rebound headache)  Interval history 11/28/2015; She has worsening headaches. She is crying today, in the last 3 weeks over 8 migraines. Relpax gave her side effects, chest pain and tightness. She started Amitriptyline, she didn't have a headache for a few months. She was having insomnia and amitrip helped a lot but made  her too drowy. Sleep and increased caffeine makes her headaches worse. Yesterday she had a very bad migraine. She has had more than 8 migraines in the last 3 weeks.   HPI: Susan Hopkins is a 46 y.o. female here as a referral from Dr. Jimmey Ralph for Migraine  Migraines started 7 years ago. Used to have it 1-2x a  year. Was worse with pregnancy. This last month she has pain on the right side of the head continuously. Throbbing. endorses Light sensitivity, sound sensitivity. Has wavy light aura. She has them 2x a month. This month she has had it a at a low level all month, always there. She takes tylenol, ibuprofen daily. She has neck and shoulder muscular pain. Imitrex doesn't work, makes her shoulder sore, hands hurt. She has nausea and vomiting with the headaches. She went back to school this month, that may be exacerbating the headaches. She has a new glasses prescription. She has 4 kids. No more kids, has an IUD. She gets to sleep but everything wakes her up. She can't nap during the day. No snoring. Migraine can be severe. Can be 10/10. 8-10 hours.   Review of Systems: Patient complains of symptoms per HPI as well as the following symptoms: denies CP, SOB. +Leg swelling, restless leg, fatigue Pertinent negatives per HPI. All others negative.     Social History   Social History  . Marital status: Married    Spouse name: [name]  . Number of children: 4  . Years of education: Ba   Occupational History  . [employer]    Social History Main Topics  . Smoking status: Never Smoker  . Smokeless tobacco: Never Used  . Alcohol use No  . Drug use: No  . Sexual activity: Yes    Birth control/ protection: IUD   Other Topics Concern  . Not on file   Social History Narrative   Lives at home with husband and 4 kids.   Right handed.    Caffeine use: Coffee (2-3 cups/day)    Family History  Problem Relation Age of Onset  . Diabetes Mother   . Hypertension Mother   . Hyperlipidemia Mother   . Hypertension Father   . Hyperlipidemia Father   . Anesthesia problems Neg Hx   . Hypotension Neg Hx   . Malignant hyperthermia Neg Hx   . Pseudochol deficiency Neg Hx     Past Medical History:  Diagnosis Date  . ADVANCED MATERNAL AGE 23/02/2008  . ANEMIA, IRON DEFICIENCY, UNSPEC. 02/12/2007   after IUD  .  Constipation   . Contact dermatitis 08/27/2011  . Diabetes mellitus without complication (HCC)    gestational diab. currently & with infant #2  . DIABETES MELLITUS, GESTATIONAL, HX OF 05/18/2008  . Diarrhea   . 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 Gastric ulcer    history of  . GERD (gastroesophageal reflux disease)    doesn't take anything for acid reflux  . Gestational diabetes A2/B 07/20/2012   Fetal echo-nml-under media tab, needs baseline labs-nml with nml TSH and 24 hour urine-68mg . To see optho in Nov 2013-Per pt. Ok, serial u/s for growth, 2x/wk testing at 32 wks.   . Hemorrhoids   . 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 IUD complication (HCC) 07/28/2015  . Migraine headache    last migraine in Sept 2012    Past Surgical History:  Procedure Laterality Date  . CESAREAN SECTION  2005/2010  . CESAREAN SECTION N/A 01/22/2013  Procedure: CESAREAN SECTION;  Surgeon: Reva Bores, MD;  Location: [facility];  Service: Obstetrics;  Laterality: N/A;  Repeat  . CESAREAN SECTION  2005  . CESAREAN SECTION  2010  . CHOLECYSTECTOMY  11/28/2011   Procedure: LAPAROSCOPIC CHOLECYSTECTOMY WITH INTRAOPERATIVE CHOLANGIOGRAM;  Surgeon: Wilmon Arms. Corliss Skains, MD;  Location: [facility];  Service: General;  Laterality: N/A;  . DILATION AND CURETTAGE OF UTERUS  2009  . female circumcision  41  . TONSILLECTOMY  1989    Current Outpatient Prescriptions  Medication Sig Dispense Refill  . Diclofenac Potassium 50 MG PACK Take 50 mg by mouth once as needed. Take once daily as needed with headache onset. Please take with food 30 each 11  . ferrous sulfate 325 (65 FE) MG tablet Take 1 tablet (325 mg total) by mouth daily with breakfast. 30 tablet 2  . hydrochlorothiazide (HYDRODIURIL) 25 MG tablet Take 0.5 tablets (12.5 mg total) by mouth daily. 45 tablet 6  . methylPREDNISolone (MEDROL DOSEPAK) 4 MG TBPK tablet follow package directions 21 tablet 3  . Multiple  Vitamins-Minerals (MULTIVITAMIN WITH MINERALS) tablet Take 1 tablet by mouth daily. Reported on 05/07/2016    . nortriptyline (PAMELOR) 10 MG capsule Take 3 capsules (30 mg total) by mouth at bedtime. 90 capsule 11  . promethazine (PHENERGAN) 25 MG tablet Take 1 tablet (25 mg total) by mouth every 8 (eight) hours as needed for nausea or vomiting. 30 tablet 0  . propranolol ER (INDERAL LA) 60 MG 24 hr capsule Take 1 capsule (60 mg total) by mouth at bedtime. 30 capsule 12  . traMADol (ULTRAM) 50 MG tablet Take 1 tablet (50 mg total) by mouth every 6 (six) hours as needed. 30 tablet 5   No current facility-administered medications for this visit.     Allergies as of 05/20/2017 - Review Complete 12/19/2016  Allergen Reaction Noted  . Relpax [eletriptan] Shortness Of Breath 11/14/2015  . Oxycodone Itching 12/26/2011    Vitals: Ht 5\' 6"  (1.676 m)   Wt 179 lb (81.2 kg)   BMI 28.89 kg/m  Last Weight:  Wt Readings from Last 1 Encounters:  05/20/17 179 lb (81.2 kg)   Last Height:   Ht Readings from Last 1 Encounters:  05/20/17 5\' 6"  (1.676 m)   Physical exam: Exam: Gen: NAD, conversant, well nourised, obese, well groomed                     CV: RRR, no MRG. No Carotid Bruits. No peripheral edema, warm, nontender Eyes: Conjunctivae clear without exudates or hemorrhage  Neuro: Detailed Neurologic Exam  Speech:    Speech is normal; fluent and spontaneous with normal comprehension.  Cognition:    The patient is oriented to person, place, and time;     recent and remote memory intact;     language fluent;     normal attention, concentration,     fund of knowledge Cranial Nerves:    The pupils are equal, round, and reactive to light. The fundi are normal and spontaneous venous pulsations are present. Visual fields are full to finger confrontation. Extraocular movements are intact. Trigeminal sensation is intact and the muscles of mastication are normal. The face is symmetric. The palate  elevates in the midline. Hearing intact. Voice is normal. Shoulder shrug is normal. The tongue has normal motion without fasciculations.   Coordination:    Normal finger to nose and heel to shin. Normal rapid alternating movements.   Gait:  Heel-toe and tandem gait are normal.   Motor Observation:    No asymmetry, no atrophy, and no involuntary movements noted. Tone:    Normal muscle tone.    Posture:    Posture is normal. normal erect    Strength:    Strength is V/V in the upper and lower limbs.      Sensation: intact to LT     Reflex Exam:  DTR's:    Deep tendon reflexes in the upper and lower extremities are normal bilaterally.   Toes:    The toes are downgoing bilaterally.   Clonus:    Clonus is absent.     Assessment/Plan: 46 year old female with migraines. She is doing well in migraines have significantly improved. She is on propranolol and nortriptyline. She is having some side effects of the nortriptyline will decrease slowly. We'll change her acute management and she can use can be onset of the diclofenac pills for acute management, Phenergan for nausea she cannot tolerate triptan's.  MRI of the brain was normal Diclofenac and tramadol for acute management, phenergan for nausea (had tongue swelling with triptans) Continue propranol to 60mg  at night ER Slowly titrate Nortriptyline 30mg  qhs to 20 mg and then 10 mg daily at bedtime and then stop. Discussed botox for migraine Other medications we can try are Depakote or Cymbalta Discussed Aimovig  Naomie Dean, MD  [facility] [address] [place], [place] [address]  Phone [phone] Fax [phone]  A total of 25 minutes was spent face-to-face with this patient. Over half this time was spent on counseling patient on the migriane diagnosis and different diagnostic and therapeutic options available.

## 2017-05-20 NOTE — Addendum Note (Signed)
Addended by: Naomie DeanAHERN, Kayann Maj B on: 05/20/2017 07:32 PM   Modules accepted: Orders

## 2017-05-21 ENCOUNTER — Telehealth: Payer: Self-pay

## 2017-05-21 ENCOUNTER — Ambulatory Visit: Payer: Self-pay | Admitting: Obstetrics and Gynecology

## 2017-05-21 NOTE — Progress Notes (Signed)
Tramadol rx printed, signed and faxed to pharmacy. 

## 2017-05-21 NOTE — Telephone Encounter (Signed)
Pt enrollment and rx form for Cambia completed, signed and faxed to [facility].

## 2017-05-27 ENCOUNTER — Emergency Department (HOSPITAL_COMMUNITY)
Admission: EM | Admit: 2017-05-27 | Discharge: 2017-05-27 | Disposition: A | Discharge status: Home / Self Care | Payer: BLUE CROSS/BLUE SHIELD | Attending: Emergency Medicine | Admitting: Emergency Medicine

## 2017-05-27 ENCOUNTER — Encounter (HOSPITAL_COMMUNITY): Payer: Self-pay | Admitting: Emergency Medicine

## 2017-05-27 DIAGNOSIS — R110 Nausea: Secondary | ICD-10-CM | POA: Insufficient documentation

## 2017-05-27 DIAGNOSIS — R51 Headache: Secondary | ICD-10-CM | POA: Insufficient documentation

## 2017-05-27 DIAGNOSIS — Z5321 Procedure and treatment not carried out due to patient leaving prior to being seen by health care provider: Secondary | ICD-10-CM | POA: Diagnosis not present

## 2017-05-27 NOTE — Telephone Encounter (Signed)
[facility] called re: denial of Cmbia, she said a letter is in route for denial and a list of alternative medications. They can be reached at [phone]

## 2017-05-27 NOTE — ED Triage Notes (Signed)
Patient c/o headache with N/V and light sensitivity x3 days. Denies blurred vision and weakness. Ambulatory to triage.

## 2017-05-27 NOTE — Telephone Encounter (Signed)
[facility] for Verlin [place]ester[place] is denied. It is not on the formulary and only covered when 2 alternative meds have been tried. Pt has tried diclofenac tabs in the past but has also tried and failed acetaminophen, cyclobenzaprine, triptans, ibuprofen, indomethacin, propranolol, topiramate. Called [employer] to let them know that pt has tried additional alternative meds. Will need to do a Provider Courtesy Review, attn: PCR, ref# [id number] noting tried and failed meds. [facility] [phone].

## 2017-05-28 ENCOUNTER — Telehealth: Payer: Self-pay | Admitting: Neurology

## 2017-05-28 NOTE — Telephone Encounter (Signed)
Pt said she has had a migraine since Saturday and would like to be scheduled for an infusion, please call

## 2017-05-28 NOTE — Telephone Encounter (Signed)
Discussed w/ [facility] RN who said that pt can come in for infusion anytime in the am. Attempted to call pt back but no answer and unable to leave mssg.

## 2017-05-28 NOTE — Telephone Encounter (Signed)
Ok fo rDr. Ahern's typical migraine cocktail

## 2017-05-28 NOTE — Telephone Encounter (Signed)
PCR request faxed to [employer] in attempt to get Cambia approved for pt.

## 2017-05-28 NOTE — Telephone Encounter (Signed)
Spoke to pt. Says she had a bad migraine on Sat that continued until she went to the ED yesterday. Reports that she was only there for about an hr. Today, she's feeling better after taking rescue meds today. Plans on leaving to go out of the country tomorrow. Agreed to call back first thing in the morning w/ any worsening symptoms. Verbalized understanding and appreciation for call.

## 2017-06-02 NOTE — Telephone Encounter (Signed)
Susan Hopkins/Susan Hopkins needs PA. She needs chart notes. Please call

## 2017-06-02 NOTE — Telephone Encounter (Signed)
Requested notes faxed to [facility] for [place] PA.

## 2017-07-30 ENCOUNTER — Other Ambulatory Visit: Payer: Self-pay | Admitting: Neurology

## 2017-09-03 ENCOUNTER — Ambulatory Visit (INDEPENDENT_AMBULATORY_CARE_PROVIDER_SITE_OTHER): Payer: BLUE CROSS/BLUE SHIELD | Admitting: Family Medicine

## 2017-09-03 VITALS — BP 114/72 | HR 79 | Temp 98.3°F | Wt 188.0 lb

## 2017-09-03 DIAGNOSIS — N393 Stress incontinence (female) (male): Secondary | ICD-10-CM | POA: Diagnosis not present

## 2017-09-03 DIAGNOSIS — G43109 Migraine with aura, not intractable, without status migrainosus: Secondary | ICD-10-CM | POA: Diagnosis not present

## 2017-09-03 DIAGNOSIS — J302 Other seasonal allergic rhinitis: Secondary | ICD-10-CM | POA: Diagnosis not present

## 2017-09-03 DIAGNOSIS — E119 Type 2 diabetes mellitus without complications: Secondary | ICD-10-CM

## 2017-09-03 DIAGNOSIS — N951 Menopausal and female climacteric states: Secondary | ICD-10-CM | POA: Diagnosis not present

## 2017-09-03 DIAGNOSIS — R7309 Other abnormal glucose: Secondary | ICD-10-CM | POA: Diagnosis not present

## 2017-09-03 LAB — POCT GLYCOSYLATED HEMOGLOBIN (HGB A1C): HEMOGLOBIN A1C: 6.7

## 2017-09-03 MED ORDER — FLUTICASONE PROPIONATE 50 MCG/ACT NA SUSP: 2.0000 | Freq: Every day | NASAL | 3 refills | Status: DC

## 2017-09-03 MED ORDER — METFORMIN HCL 500 MG PO TABS: 500.0000 mg | ORAL_TABLET | Freq: Every day | ORAL | 1 refills | Status: DC

## 2017-09-03 NOTE — Assessment & Plan Note (Addendum)
  Follows with neurology, on propranolol, diclofenac, and toradol as needed. Has been having breakthrough headaches that seem to be more stress related  -continue current medications -follow up with neuro as scheduled -discussed stress reduction techniques, heating pad for neck pain, trying massage -follow up as needed

## 2017-09-03 NOTE — Assessment & Plan Note (Signed)
  New diagnosis, A1C today 6.7  -start 500 mg metformin daily, increase to BID as tolerated -follow up 3 months -encouraged weight loss, exercise, and dietary changes and discussed these at length with patient

## 2017-09-03 NOTE — Assessment & Plan Note (Signed)
  Patient experiencing irregular periods, hot flashes, fatigue.   -discussed OTC options for symptom relief -follow up as needed

## 2017-09-03 NOTE — Assessment & Plan Note (Signed)
  Patient c/o itchy watery eyes and sneezing  -rx given for Flonase to use daily -follow up as needed

## 2017-09-03 NOTE — Progress Notes (Signed)
    Subjective:    Patient ID: Susan Hopkins, female    DOB: January 26, 1971, 46 y.o.   MRN: [id number]   CC: headaches  Headaches- Has hx of migraines, seen by neuro as recently as June. Taking propranolol. Stopped TCA. Dicofenac for breakthrough pain. Also has tramadol which she uses sparing. These headaches are not like usual migraines in that they are frontal in location and feel like squeezing of her head. She is also fatigued lately more so than usual. She reports stress raising her 4 children.   Irregular periods- Periods becoming more irregular. She has IUD and is not concerned about pregnancy. Thinks mother when through menopause after age 6.   Stress and urge incontinence- had 1 child vaginally, 3 others via c-section  Hx of elevated sugar- Elevated glucose previously with A1C of 6.4. Strong FH of DM and has had Gestational DM with 3 prior pregnancies. She does not exercise, has tried to cut down on sugars/carbs in diet.   She also complains of allergies now that season has changed. Does not take allergy medications. Has dry itchy eyes and runny nose.  Smoking status reviewed- non-smoker  Review of Systems- see HPI   Objective:  BP 114/72   Pulse 79   Temp 98.3 F (36.8 C) (Oral)   Wt 188 lb (85.3 kg)   LMP 05/30/2017   BMI 30.34 kg/m  Vitals and nursing note reviewed  General: well nourished, in no acute distress Cardiac: RRR, clear S1 and S2, no murmurs, rubs, or gallops Respiratory: clear to auscultation bilaterally, no increased work of breathing Extremities: no edema or cyanosis Skin: warm and dry, no rashes noted Neuro: alert and oriented, no focal deficits. Strength 5/5 in upper and lower extremities bilaterally   Assessment & Plan:    Migraine  Follows with neurology, on propranolol, diclofenac, and toradol as needed. Has been having breakthrough headaches that seem to be more stress related  -continue current medications -follow up with neuro as  scheduled -discussed stress reduction techniques, heating pad for neck pain, trying massage -follow up as needed    Type 2 diabetes mellitus without complication, without long-term current use of insulin (HCC)  New diagnosis, A1C today 6.7  -start 500 mg metformin daily, increase to BID as tolerated -follow up 3 months -encouraged weight loss, exercise, and dietary changes and discussed these at length with patient  Stress incontinence  Has had 1 child vaginally. There is also some component of urge incontinence as patient does not urinate frequently  -encouraged kegals -advised using bathroom more frequently to avoid leaking -follow up if symptoms worsen or fail to improve  Seasonal allergic rhinitis  Patient c/o itchy watery eyes and sneezing  -rx given for Flonase to use daily -follow up as needed   Perimenopausal  Patient experiencing irregular periods, hot flashes, fatigue.   -discussed OTC options for symptom relief -follow up as needed     Return in about 3 months (around 12/03/2017), or if symptoms worsen or fail to improve.   Dolores Patty, DO Family Medicine Resident PGY-2

## 2017-09-03 NOTE — Assessment & Plan Note (Signed)
  Has had 1 child vaginally. There is also some component of urge incontinence as patient does not urinate frequently  -encouraged kegals -advised using bathroom more frequently to avoid leaking -follow up if symptoms worsen or fail to improve

## 2017-09-03 NOTE — Patient Instructions (Addendum)
It was great seeing you today!  Please start taking 500 mg Metformin every morning with breakfast. If you're doing well and tolerating this medication you can go up to 500 mg twice a day or two pills every morning.   You can try over the counter supplements that contain Black Cohosh to help with menopause symptoms.  If you have questions or concerns please do not hesitate to call at [phone].  Dolores Patty, DO PGY-2, [facility] 09/03/2017 10:32 AM    Perimenopause Perimenopause is the time when your body begins to move into the menopause (no menstrual period for 12 straight months). It is a natural process. Perimenopause can begin 2-8 years before the menopause and usually lasts for 1 year after the menopause. During this time, your ovaries may or may not produce an egg. The ovaries vary in their production of estrogen and progesterone hormones each month. This can cause irregular menstrual periods, difficulty getting pregnant, vaginal bleeding between periods, and uncomfortable symptoms. What are the causes?  Irregular production of the ovarian hormones, estrogen and progesterone, and not ovulating every month. Other causes include:  Tumor of the pituitary gland in the brain.  Medical disease that affects the ovaries.  Radiation treatment.  Chemotherapy.  Unknown causes.  Heavy smoking and excessive alcohol intake can bring on perimenopause sooner.  What are the signs or symptoms?  Hot flashes.  Night sweats.  Irregular menstrual periods.  Decreased sex drive.  Vaginal dryness.  Headaches.  Mood swings.  Depression.  Memory problems.  Irritability.  Tiredness.  Weight gain.  Trouble getting pregnant.  The beginning of losing bone cells (osteoporosis).  The beginning of hardening of the arteries (atherosclerosis). How is this diagnosed? Your health care provider will make a diagnosis by analyzing your age, menstrual history, and  symptoms. He or she will do a physical exam and note any changes in your body, especially your female organs. Female hormone tests may or may not be helpful depending on the amount of female hormones you produce and when you produce them. However, other hormone tests may be helpful to rule out other problems. How is this treated? In some cases, no treatment is needed. The decision on whether treatment is necessary during the perimenopause should be made by you and your health care provider based on how the symptoms are affecting you and your lifestyle. Various treatments are available, such as:  Treating individual symptoms with a specific medicine for that symptom.  Herbal medicines that can help specific symptoms.  Counseling.  Group therapy.  Follow these instructions at home:  Keep track of your menstrual periods (when they occur, how heavy they are, how long between periods, and how long they last) as well as your symptoms and when they started.  Only take over-the-counter or prescription medicines as directed by your health care provider.  Sleep and rest.  Exercise.  Eat a diet that contains calcium (good for your bones) and soy (acts like the estrogen hormone).  Do not smoke.  Avoid alcoholic beverages.  Take vitamin supplements as recommended by your health care provider. Taking vitamin E may help in certain cases.  Take calcium and vitamin D supplements to help prevent bone loss.  Group therapy is sometimes helpful.  Acupuncture may help in some cases. Contact a health care provider if:  You have questions about any symptoms you are having.  You need a referral to a specialist (gynecologist, psychiatrist, or psychologist). Get help right away if:  You have vaginal bleeding.  Your period lasts longer than 8 days.  Your periods are recurring sooner than 21 days.  You have bleeding after intercourse.  You have severe depression.  You have pain when you  urinate.  You have severe headaches.  You have vision problems. This information is not intended to replace advice given to you by your health care provider. Make sure you discuss any questions you have with your health care provider. Document Released: 01/09/2005 Document Revised: 05/09/2016 Document Reviewed: 07/01/2013 Elsevier Interactive Patient Education  2017 Elsevier Inc.    Diet Recommendations for Diabetes   Starchy (carb) foods: Bread, rice, pasta, potatoes, corn, cereal, grits, crackers, bagels, muffins, all baked goods.  (Fruits, milk, and yogurt also have carbohydrate, but most of these foods will not spike your blood sugar as the starchy foods will.)  A few fruits do cause high blood sugars; use small portions of bananas (limit to 1/2 at a time), grapes, watermelon, oranges, and most tropical fruits.    Protein foods: Meat, fish, poultry, eggs, dairy foods, and beans such as pinto and kidney beans (beans also provide carbohydrate).   1. Eat at least 3 meals and 1-2 snacks per day. Never go more than 4-5 hours while awake without eating. Eat breakfast within the first hour of getting up.   2. Limit starchy foods to TWO per meal and ONE per snack. ONE portion of a starchy  food is equal to the following:   - ONE slice of bread (or its equivalent, such as half of a hamburger bun).   - 1/2 cup of a "scoopable" starchy food such as potatoes or rice.   - 15 grams of carbohydrate as shown on food label.  3. Include at every meal: a protein food, a carb food, and vegetables and/or fruit.   - Obtain twice the volume of veg's as protein or carbohydrate foods for both lunch and dinner.   - Fresh or frozen veg's are best.   - Keep frozen veg's on hand for a quick vegetable serving.

## 2017-09-22 ENCOUNTER — Ambulatory Visit (INDEPENDENT_AMBULATORY_CARE_PROVIDER_SITE_OTHER): Payer: BLUE CROSS/BLUE SHIELD | Admitting: *Deleted

## 2017-09-22 DIAGNOSIS — Z23 Encounter for immunization: Secondary | ICD-10-CM | POA: Diagnosis not present

## 2017-11-17 ENCOUNTER — Other Ambulatory Visit: Payer: Self-pay | Admitting: Obstetrics and Gynecology

## 2017-11-17 DIAGNOSIS — I10 Essential (primary) hypertension: Secondary | ICD-10-CM

## 2017-11-18 ENCOUNTER — Ambulatory Visit: Payer: BLUE CROSS/BLUE SHIELD | Admitting: Neurology

## 2017-11-18 ENCOUNTER — Encounter: Payer: Self-pay | Admitting: Neurology

## 2017-11-18 VITALS — BP 125/82 | HR 94 | Ht 66.0 in | Wt 181.2 lb

## 2017-11-18 DIAGNOSIS — G43709 Chronic migraine without aura, not intractable, without status migrainosus: Secondary | ICD-10-CM | POA: Diagnosis not present

## 2017-11-18 DIAGNOSIS — F320 Major depressive disorder, single episode, mild: Secondary | ICD-10-CM

## 2017-11-18 MED ORDER — DICLOFENAC POTASSIUM 50 MG PO TABS: ORAL_TABLET | ORAL | 11 refills | Status: DC

## 2017-11-18 MED ORDER — PROPRANOLOL HCL ER 60 MG PO CP24: 60.0000 mg | ORAL_CAPSULE | Freq: Every day | ORAL | 12 refills | Status: DC

## 2017-11-18 MED ORDER — NORTRIPTYLINE HCL 10 MG PO CAPS: 30.0000 mg | ORAL_CAPSULE | Freq: Every day | ORAL | 11 refills | Status: DC

## 2017-11-18 MED ORDER — FLUOXETINE HCL 20 MG PO CAPS: 20.0000 mg | ORAL_CAPSULE | Freq: Every day | ORAL | 11 refills | Status: DC

## 2017-11-18 MED ORDER — PROMETHAZINE HCL 25 MG PO TABS: 25.0000 mg | ORAL_TABLET | Freq: Three times a day (TID) | ORAL | 0 refills | Status: DC | PRN

## 2017-11-18 MED ORDER — FREMANEZUMAB-VFRM 225 MG/1.5ML ~~LOC~~ SOSY: 225.0000 mg | PREFILLED_SYRINGE | Freq: Once | SUBCUTANEOUS | Status: DC

## 2017-11-18 MED ORDER — FREMANEZUMAB-VFRM 225 MG/1.5ML ~~LOC~~ SOSY: 225.0000 mg | PREFILLED_SYRINGE | SUBCUTANEOUS | 11 refills | Status: DC

## 2017-11-18 MED ORDER — DICLOFENAC POTASSIUM(MIGRAINE) 50 MG PO PACK: 50.0000 mg | PACK | Freq: Once | ORAL | 11 refills | Status: DC | PRN

## 2017-11-18 NOTE — Patient Instructions (Signed)
Fluoxetine capsules or tablets (Depression/Mood Disorders) What is this medicine? FLUOXETINE (floo OX e teen) belongs to a class of drugs known as selective serotonin reuptake inhibitors (SSRIs). It helps to treat mood problems such as depression, obsessive compulsive disorder, and panic attacks. It can also treat certain eating disorders. This medicine may be used for other purposes; ask your health care provider or pharmacist if you have questions. COMMON BRAND NAME(S): Prozac What should I tell my health care provider before I take this medicine? They need to know if you have any of these conditions: -bipolar disorder or a family history of bipolar disorder -bleeding disorders -glaucoma -heart disease -liver disease -low levels of sodium in the blood -seizures -suicidal thoughts, plans, or attempt; a previous suicide attempt by you or a family member -take MAOIs like Carbex, Eldepryl, Marplan, Nardil, and Parnate -take medicines that treat or prevent blood clots -thyroid disease -an unusual or allergic reaction to fluoxetine, other medicines, foods, dyes, or preservatives -pregnant or trying to get pregnant -breast-feeding How should I use this medicine? Take this medicine by mouth with a glass of water. Follow the directions on the prescription label. You can take this medicine with or without food. Take your medicine at regular intervals. Do not take it more often than directed. Do not stop taking this medicine suddenly except upon the advice of your doctor. Stopping this medicine too quickly may cause serious side effects or your condition may worsen. A special MedGuide will be given to you by the pharmacist with each prescription and refill. Be sure to read this information carefully each time. Talk to your pediatrician regarding the use of this medicine in children. While this drug may be prescribed for children as young as 7 years for selected conditions, precautions do  apply. Overdosage: If you think you have taken too much of this medicine contact a poison control center or emergency room at once. NOTE: This medicine is only for you. Do not share this medicine with others. What if I miss a dose? If you miss a dose, skip the missed dose and go back to your regular dosing schedule. Do not take double or extra doses. What may interact with this medicine? Do not take this medicine with any of the following medications: -other medicines containing fluoxetine, like Sarafem or Symbyax -cisapride -linezolid -MAOIs like Carbex, Eldepryl, Marplan, Nardil, and Parnate -methylene blue (injected into a vein) -pimozide -thioridazine This medicine may also interact with the following medications: -alcohol -amphetamines -aspirin and aspirin-like medicines -carbamazepine -certain medicines for depression, anxiety, or psychotic disturbances -certain medicines for migraine headaches like almotriptan, eletriptan, frovatriptan, naratriptan, rizatriptan, sumatriptan, zolmitriptan -digoxin -diuretics -fentanyl -flecainide -furazolidone -isoniazid -lithium -medicines for sleep -medicines that treat or prevent blood clots like warfarin, enoxaparin, and dalteparin -NSAIDs, medicines for pain and inflammation, like ibuprofen or naproxen -phenytoin -procarbazine -propafenone -rasagiline -ritonavir -supplements like St. John's wort, kava kava, valerian -tramadol -tryptophan -vinblastine This list may not describe all possible interactions. Give your health care provider a list of all the medicines, herbs, non-prescription drugs, or dietary supplements you use. Also tell them if you smoke, drink alcohol, or use illegal drugs. Some items may interact with your medicine. What should I watch for while using this medicine? Tell your doctor if your symptoms do not get better or if they get worse. Visit your doctor or health care professional for regular checks on your  progress. Because it may take several weeks to see the full effects of this medicine, it   is important to continue your treatment as prescribed by your doctor. Patients and their families should watch out for new or worsening thoughts of suicide or depression. Also watch out for sudden changes in feelings such as feeling anxious, agitated, panicky, irritable, hostile, aggressive, impulsive, severely restless, overly excited and hyperactive, or not being able to sleep. If this happens, especially at the beginning of treatment or after a change in dose, call your health care professional. You may get drowsy or dizzy. Do not drive, use machinery, or do anything that needs mental alertness until you know how this medicine affects you. Do not stand or sit up quickly, especially if you are an older patient. This reduces the risk of dizzy or fainting spells. Alcohol may interfere with the effect of this medicine. Avoid alcoholic drinks. Your mouth may get dry. Chewing sugarless gum or sucking hard candy, and drinking plenty of water may help. Contact your doctor if the problem does not go away or is severe. This medicine may affect blood sugar levels. If you have diabetes, check with your doctor or health care professional before you change your diet or the dose of your diabetic medicine. What side effects may I notice from receiving this medicine? Side effects that you should report to your doctor or health care professional as soon as possible: -allergic reactions like skin rash, itching or hives, swelling of the face, lips, or tongue -anxious -black, tarry stools -breathing problems -changes in vision -confusion -elevated mood, decreased need for sleep, racing thoughts, impulsive behavior -eye pain -fast, irregular heartbeat -feeling faint or lightheaded, falls -feeling agitated, angry, or irritable -hallucination, loss of contact with reality -loss of balance or coordination -loss of memory -painful  or prolonged erections -restlessness, pacing, inability to keep still -seizures -stiff muscles -suicidal thoughts or other mood changes -trouble sleeping -unusual bleeding or bruising -unusually weak or tired -vomiting Side effects that usually do not require medical attention (report to your doctor or health care professional if they continue or are bothersome): -change in appetite or weight -change in sex drive or performance -diarrhea -dry mouth -headache -increased sweating -nausea -tremors This list may not describe all possible side effects. Call your doctor for medical advice about side effects. You may report side effects to FDA at 1-800-FDA-1088. Where should I keep my medicine? Keep out of the reach of children. Store at room temperature between 15 and 30 degrees C (59 and 86 degrees F). Throw away any unused medicine after the expiration date. NOTE: This sheet is a summary. It may not cover all possible information. If you have questions about this medicine, talk to your doctor, pharmacist, or health care provider.  2018 Elsevier/Gold Standard (2016-05-04 15:55:27)  

## 2017-11-18 NOTE — Progress Notes (Signed)
[facility]    Provider:  Dr Lucia Gaskins Referring Provider: Pincus Large, DO Primary Care Physician:  Tillman Sers, DO CC: Migraine  Interval history 2018: She has migraines 4x a month. They can last 2 days. She stopped Nortriptyline and the headaches got worse. She has 20 headache free days, 10 migraine days a month. 4 days are severe migrainous and a few other days are migrainous but mild. Worse if she forgets the Nortriptyline.  Propranolol also helps esp with palpitations, she feels worse if she forgets. She is feeling very depressed, she cries in the office today. She feels lonely, she doesn't have any family here and it is hard to raise children here. She feels sad most days. She cries sometimes at home. No suicidal ideation. She has friends here, she has a close friend here. She has had these feelings for a long time. She has an appointment with her pcp next week.   Interval 05/20/2017:  Patient returns today for follow-up of migraines on nortriptyline and propranolol. Had side effects to Topamax. We were unable to increase her nortriptyline due to side effects. She takes Phenergan and diclofenac for acute management. In one month she has 2-3 migraines and if she takes the diclofenac and phenergan during the aura she does not get the migraines and she sleeps well and she is fine. She is doing well on the Nortriptyline and propranolol. She has tiredness to the nortriptyline, slowly titrate off. Every week go down a pill. Will stay on the propranolol. Will try Cambia instead of diclofenac.  Interval history 12/19/2016:Patient is here for follow-up of chronic migraines. She is on nortriptyline and propranolol. Topamax was tried in the past the patient had side effects. Propranolol was increased at last appointment to 60 mg a day. We tried to increase the nortriptyline in the past but had joint pain. She had side effects to trip him. She takes Phenergan for headaches and  diclofenac, I have discussed rebound headache and need to limit these. Relpax gave her chest pain and tightness. Amitriptyline made her too drowsy increased caffeine makes her headaches worse. Imitrex caused her neck and shoulder pain. Patient's headaches are described as throbbing, light sensitivity, sound sensitivity, neck and shoulder muscular pain, mostly on the right but can be on the left. She has been sleeping more and drinking caffeine which are triggers. She is fine today, no headache but has been suffering on and off for the last 3 weeks with migraines. They start after having coffee, would switch to decaf. And she has pain behind the eyes. She is having pain right now.   She was doing well for 4-5 months since last being seen. She started having a headache 3 weeks ago, she is having a lot of aura (does not always have an aura). 3 weeks ago had migraines all weekend. She has increased her Nortriptyline to 3 pills at night and propranolol to 20mg  tid. Headaches for the last 5 days as well.   Medications tried: Nortriptyline(on low dose could not increase doe), Amitriptyline(too sleepy), propranolol(on ER currently), Topamax, Relpax(chest pain and tightness), amitriptyline, Imitrex, Phenergan and diclofenac for acute management.  Interval history 05/25/2016: She is on nortriptyline and propranolol and this has helped with the migraines. She is still having chronic migraines 4-5 in the last 2 weeks but she has been fasting and thinks dehydration is affecting. Before this the headaches were better she was having them 1-2x a month. For 3 months she had no  headaches at all. Really just an increase in the last few weeks likely due to fasting. She takes cambia. She was sensitive to imitrex and relpax. With the relpax her blood pressure went up and she had jaw pain. She went to the emergency room, tongue swelling. She tried to increase the Nortriptyline and had joint pain. She is on 20mg  mortriptyline. She  had side effects to trokendi. She takes phenergan for the headaches. She takes diclofenac 4-5x a week no more (can cause rebound headache)  Interval history 11/28/2015; She has worsening headaches. She is crying today, in the last 3 weeks over 8 migraines. Relpax gave her side effects, chest pain and tightness. She started Amitriptyline, she didn't have a headache for a few months. She was having insomnia and amitrip helped a lot but made her too drowy. Sleep and increased caffeine makes her headaches worse. Yesterday she had a very bad migraine. She has had more than 8 migraines in the last 3 weeks.   HPI: Susan Hopkins is a 46 y.o. female here as a referral from Dr. Jimmey Ralph for Migraine  Migraines started 7 years ago. Used to have it 1-2x a year. Was worse with pregnancy. This last month she has pain on the right side of the head continuously. Throbbing. endorses Light sensitivity, sound sensitivity. Has wavy light aura. She has them 2x a month. This month she has had it a at a low level all month, always there. She takes tylenol, ibuprofen daily. She has neck and shoulder muscular pain. Imitrex doesn't work, makes her shoulder sore, hands hurt. She has nausea and vomiting with the headaches. She went back to school this month, that may be exacerbating the headaches. She has a new glasses prescription. She has 4 kids. No more kids, has an IUD. She gets to sleep but everything wakes her up. She can't nap during the day. No snoring. Migraine can be severe. Can be 10/10. 8-10 hours.      Social History   Socioeconomic History  . Marital status: Married    Spouse name: [name]  . Number of children: 4  . Years of education: Ba  . Highest education level: Not on file  Social Needs  . Financial resource strain: Not on file  . Food insecurity - worry: Not on file  . Food insecurity - inability: Not on file  . Transportation needs - medical: Not on file  . Transportation needs - non-medical:  Not on file  Occupational History  . Occupation: [employer]  Tobacco Use  . Smoking status: Never Smoker  . Smokeless tobacco: Never Used  Substance and Sexual Activity  . Alcohol use: No  . Drug use: No  . Sexual activity: Yes    Birth control/protection: IUD  Other Topics Concern  . Not on file  Social History Narrative   Lives at home with husband and 4 kids.   Right handed.    Caffeine use: Coffee (1-2 cups/day)    Family History  Problem Relation Age of Onset  . Diabetes Mother   . Hypertension Mother   . Hyperlipidemia Mother   . Hypertension Father   . Hyperlipidemia Father   . Prostate cancer Father   . Anesthesia problems Neg Hx   . Hypotension Neg Hx   . Malignant hyperthermia Neg Hx   . Pseudochol deficiency Neg Hx     Past Medical History:  Diagnosis Date  . ADVANCED MATERNAL AGE 79/02[birth year]2008  . ANEMIA, IRON DEFICIENCY, UNSPEC. 02/12/2007  after IUD  . Constipation   . Contact dermatitis 08/27/2011  . Diabetes mellitus without complication (HCC)    gestational diab. currently & with infant #2  . DIABETES MELLITUS, GESTATIONAL, HX OF 05/18/2008  . Diabetes mellitus, type II (HCC)    Hbg A1C 6.9 after pregancy, now on Metformin  . Diarrhea   . 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 Gastric ulcer    history of  . GERD (gastroesophageal reflux disease)    doesn't take anything for acid reflux  . Gestational diabetes A2/B 07/20/2012   Fetal echo-nml-under media tab, needs baseline labs-nml with nml TSH and 24 hour urine-68mg . To see optho in Nov 2013-Per pt. Ok, serial u/s for growth, 2x/wk testing at 32 wks.   . Hemorrhoids   . 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 IUD complication (HCC) 07/28/2015  . Migraine headache    last migraine in Sept 2012    Past Surgical History:  Procedure Laterality Date  . CESAREAN SECTION  2005/2010  . CESAREAN SECTION N/A 01/22/2013   Procedure: CESAREAN SECTION;  Surgeon: Reva Boresanya  S Pratt, MD;  Location: [facility];  Service: Obstetrics;  Laterality: N/A;  Repeat  . CESAREAN SECTION  2005  . CESAREAN SECTION  2010  . CHOLECYSTECTOMY  11/28/2011   Procedure: LAPAROSCOPIC CHOLECYSTECTOMY WITH INTRAOPERATIVE CHOLANGIOGRAM;  Surgeon: Wilmon ArmsMatthew K. Corliss Skainssuei, MD;  Location: [facility] OR;  Service: General;  Laterality: N/A;  . DILATION AND CURETTAGE OF UTERUS  2009  . female circumcision  [id number]  . TONSILLECTOMY  1989    Current Outpatient Medications  Medication Sig Dispense Refill  . diclofenac (CATAFLAM) 50 MG tablet TAKE ONE TABLET BY MOUTH ONCE DAILY AS NEEDED FOR ONSET OF HEADACHE. TAKE WITH FOOD 30 tablet 11  . Diclofenac Potassium 50 MG PACK Take 50 mg by mouth once as needed. Take once daily as needed with headache onset. Please take with food 30 each 11  . fluticasone (FLONASE) 50 MCG/ACT nasal spray Place 2 sprays into both nostrils daily. 16 g 3  . hydrochlorothiazide (HYDRODIURIL) 25 MG tablet Take 0.5 tablets (12.5 mg total) by mouth daily. 45 tablet 6  . metFORMIN (GLUCOPHAGE) 500 MG tablet Take 1 tablet (500 mg total) by mouth daily with breakfast. 90 tablet 1  . nortriptyline (PAMELOR) 10 MG capsule Take 3 capsules (30 mg total) by mouth at bedtime. 90 capsule 11  . promethazine (PHENERGAN) 25 MG tablet Take 1 tablet (25 mg total) by mouth every 8 (eight) hours as needed for nausea or vomiting. 30 tablet 0  . propranolol ER (INDERAL LA) 60 MG 24 hr capsule Take 1 capsule (60 mg total) by mouth at bedtime. 30 capsule 12  . traMADol (ULTRAM) 50 MG tablet Take 1 tablet (50 mg total) by mouth every 6 (six) hours as needed. 15 tablet 5  . FLUoxetine (PROZAC) 20 MG capsule Take 1 capsule (20 mg total) by mouth daily. 30 capsule 11  . Fremanezumab-vfrm (AJOVY) 225 MG/1.5ML SOSY Inject 225 mg into the skin every 30 (thirty) days. 1 Syringe 11   Current Facility-Administered Medications  Medication Dose Route Frequency Provider Last Rate Last Dose  . Fremanezumab-vfrm SOSY 225 mg   225 mg Subcutaneous Once Anson FretAhern, Antonia B, MD        Allergies as of 11/18/2017 - Review Complete 11/18/2017  Allergen Reaction Noted  . Relpax [eletriptan] Shortness Of Breath 11/14/2015  . Oxycodone Itching 12/26/2011    Vitals: BP 125/82 (BP Location:  Right Arm, Patient Position: Sitting)   Pulse 94   Ht 5\' 6"  (1.676 m)   Wt 181 lb 3.2 oz (82.2 kg)   BMI 29.25 kg/m  Last Weight:  Wt Readings from Last 1 Encounters:  11/18/17 181 lb 3.2 oz (82.2 kg)   Last Height:   Ht Readings from Last 1 Encounters:  11/18/17 5\' 6"  (1.676 m)   Physical exam: Exam: Gen: NAD, conversant, well nourised, obese, well groomed                     CV: RRR, no MRG. No Carotid Bruits. No peripheral edema, warm, nontender Eyes: Conjunctivae clear without exudates or hemorrhage  Neuro: Detailed Neurologic Exam  Speech:    Speech is normal; fluent and spontaneous with normal comprehension.  Cognition:    The patient is oriented to person, place, and time;     recent and remote memory intact;     language fluent;     normal attention, concentration,     fund of knowledge Cranial Nerves:    The pupils are equal, round, and reactive to light. The fundi are normal and spontaneous venous pulsations are present. Visual fields are full to finger confrontation. Extraocular movements are intact. Trigeminal sensation is intact and the muscles of mastication are normal. The face is symmetric. The palate elevates in the midline. Hearing intact. Voice is normal. Shoulder shrug is normal. The tongue has normal motion without fasciculations.   Coordination:    Normal finger to nose and heel to shin. Normal rapid alternating movements.   Gait:    Heel-toe and tandem gait are normal.   Motor Observation:    No asymmetry, no atrophy, and no involuntary movements noted. Tone:    Normal muscle tone.    Posture:    Posture is normal. normal erect    Strength:    Strength is V/V in the upper and lower  limbs.      Sensation: intact to LT     Reflex Exam:  DTR's:    Deep tendon reflexes in the upper and lower extremities are normal bilaterally.   Toes:    The toes are downgoing bilaterally.   Clonus:    Clonus is absent.   Assessment/Plan: 46 year old female with migraines. She is on propranolol and nortriptyline.  diclofenac pills for acute management, Phenergan for nausea she cannot tolerate triptan's.  MRI of the brain was normal Diclofenac for acute management, phenergan for nausea (had tongue swelling with triptans) Continue propranol to 60mg  at night ER Continua  Nortriptyline 30mg , if improved on Ajovy or prozac can titrate off Discussed botox for migraine, decided on CGRP antagonists Other medications we can try are Depakote or Cymbalta Discussed Aimovig and Ajovy - will start Ajovy today Discussed risk of seratonin syndrome, she understands risks, both medications are low dose (nortriptyline, Prozac) Prozac for depression, new problem today discussed Recommended counselor, she is following up soon with pcp for this as well Stop tramadol, discussed with patient  Discussed: To prevent or relieve headaches, try the following: Cool Compress. Lie down and place a cool compress on your head.  Avoid headache triggers. If certain foods or odors seem to have triggered your migraines in the past, avoid them. A headache diary might help you identify triggers.  Include physical activity in your daily routine. Try a daily walk or other moderate aerobic exercise.  Manage stress. Find healthy ways to cope with the stressors, such as delegating  tasks on your to-do list.  Practice relaxation techniques. Try deep breathing, yoga, massage and visualization.  Eat regularly. Eating regularly scheduled meals and maintaining a healthy diet might help prevent headaches. Also, drink plenty of fluids.  Follow a regular sleep schedule. Sleep deprivation might contribute to headaches Consider  biofeedback. With this mind-body technique, you learn to control certain bodily functions - such as muscle tension, heart rate and blood pressure - to prevent headaches or reduce headache pain.    Proceed to emergency room if you experience new or worsening symptoms or symptoms do not resolve, if you have new neurologic symptoms or if headache is severe, or for any concerning symptom.   Provided education and documentation from American headache Society toolbox including articles on: chronic migraine medication overuse headache, chronic migraines, prevention of migraines, behavioral and other nonpharmacologic treatments for headache.   Naomie Dean, MD  [facility] [address] [place], [place] [address]  Phone [phone] Fax [phone]  A total of 25 minutes was spent face-to-face with this patient. Over half this time was spent on counseling patient on the chronic migraine, depression diagnosis and different diagnostic and therapeutic options available.

## 2017-11-25 ENCOUNTER — Ambulatory Visit: Payer: BLUE CROSS/BLUE SHIELD | Admitting: Internal Medicine

## 2018-01-14 ENCOUNTER — Encounter: Payer: Self-pay | Admitting: Family Medicine

## 2018-01-14 ENCOUNTER — Ambulatory Visit: Payer: BLUE CROSS/BLUE SHIELD | Admitting: Family Medicine

## 2018-01-14 VITALS — BP 117/80 | HR 70 | Temp 97.9°F | Ht 66.0 in

## 2018-01-14 DIAGNOSIS — G43109 Migraine with aura, not intractable, without status migrainosus: Secondary | ICD-10-CM

## 2018-01-14 DIAGNOSIS — L853 Xerosis cutis: Secondary | ICD-10-CM

## 2018-01-14 MED ORDER — KETOROLAC TROMETHAMINE 30 MG/ML IJ SOLN
30.0000 mg | Freq: Once | INTRAMUSCULAR | Status: AC
  Administered 2018-01-14: 30 mg via INTRAMUSCULAR

## 2018-01-14 NOTE — Progress Notes (Signed)
    Subjective:    Patient ID: Susan Hopkins, female    DOB: [birth year], 47 y.o.   MRN: [id number]   CC: itching hands and feet  Dry skin- skin gets dry and itchy. Has used vaseline, shea butter. Her palms get itchy. No rashes that she has seen.   Migraine- wants referral to neurology and shot for pain today. She follows with neuro for migraine management but currently having more migraines than usual- she attributes this to going to school and stress. She is current with eye doctor appointments. No focal weakness.  Smoking status reviewed- non-smoker  Review of Systems- see HPI   Objective:  BP 117/80 (BP Location: Left Arm, Patient Position: Sitting, Cuff Size: Normal)   Pulse 70   Temp 97.9 F (36.6 C) (Oral)   Ht 5\' 6"  (1.676 m)   SpO2 100%   BMI 29.25 kg/m  Vitals and nursing note reviewed  General: well nourished, in no acute distress Skin: warm and dry, no rashes noted. Dry patches noted on hands, arms Neuro: alert and oriented, no focal deficits   Assessment & Plan:    Migraine  Follows with neuro but requested re-referral to neuro? Completed per her request.  30 mg toradol IM given in office  Dry skin  No rashes or lesions appreciated. Skin appears dry, likely due to winter and dry heat. Advised thick lotion after bathing, vaseline topically liberally. Follow up as needed. Patient verbalized understanding and agreement with plan.     Return if symptoms worsen or fail to improve.   Dolores PattyAngela Celene Pippins, DO Family Medicine Resident PGY-2

## 2018-01-14 NOTE — Assessment & Plan Note (Addendum)
  No rashes or lesions appreciated. Skin appears dry, likely due to winter and dry heat. Advised thick lotion after bathing, vaseline topically liberally. Follow up as needed. Patient verbalized understanding and agreement with plan.

## 2018-01-14 NOTE — Assessment & Plan Note (Signed)
  Follows with neuro but requested re-referral to neuro? Completed per her request.  30 mg toradol IM given in office

## 2018-01-14 NOTE — Patient Instructions (Signed)
  Please get AQUAPHOR or EUCERIN cream and use this after bathing liberally on your skin.   Use vaseline additionally as needed throughout day on dry itchy areas.  If you have questions or concerns please do not hesitate to call at [phone].  Dolores PattyAngela Emileigh Kellett, DO PGY-2, [facility] 01/14/2018 12:03 PM

## 2018-01-22 ENCOUNTER — Other Ambulatory Visit: Payer: Self-pay | Admitting: Neurology

## 2018-01-26 ENCOUNTER — Encounter: Payer: Self-pay | Admitting: Neurology

## 2018-01-26 ENCOUNTER — Ambulatory Visit: Payer: BLUE CROSS/BLUE SHIELD | Admitting: Neurology

## 2018-01-26 VITALS — BP 111/65 | HR 77 | Ht 66.0 in | Wt 186.0 lb

## 2018-01-26 DIAGNOSIS — G43709 Chronic migraine without aura, not intractable, without status migrainosus: Secondary | ICD-10-CM

## 2018-01-26 DIAGNOSIS — G4440 Drug-induced headache, not elsewhere classified, not intractable: Secondary | ICD-10-CM

## 2018-01-26 DIAGNOSIS — T3995XA Adverse effect of unspecified nonopioid analgesic, antipyretic and antirheumatic, initial encounter: Principal | ICD-10-CM

## 2018-01-26 NOTE — Progress Notes (Signed)
[facility]    Provider:  Dr Lucia Gaskins Referring Provider: Tillman Sers, DO Primary Care Physician:  Tillman Sers, DO CC: Migraine  Interval history 01/26/2018: Migraines have worsened. Started Ajovy. Migraines are more frequent, she is using tylenol, ibuprofen, tramadol and diclofenac every day. She is using excedrin and multiple other medications. Counseled her on Medication Overuse.  Had a long discussion, provided literature to read. Continue Ajovy.   Interval history 2018: She has migraines 4x a week. They can last 2 days. She stopped Nortriptyline and the headaches got worse. She has less than 10 headache free days, 10 migraine days a month. 4 days are severe migrainous and a few other days are migrainous but mild. Worse if she forgets the Nortriptyline.  Propranolol also helps esp with palpitations, she feels worse if she forgets. She is feeling very depressed, she cries in the office today. She feels lonely, she doesn't have any family here and it is hard to raise children here. She feels sad most days. She cries sometimes at home. No suicidal ideation. She has friends here, she has a close friend here. She has had these feelings for a long time. She has an appointment with her pcp next week.   Interval 05/20/2017:  Patient returns today for follow-up of migraines on nortriptyline and propranolol. Had side effects to Topamax. We were unable to increase her nortriptyline due to side effects. She takes Phenergan and diclofenac for acute management. In one month she has 2-3 migraines and if she takes the diclofenac and phenergan during the aura she does not get the migraines and she sleeps well and she is fine. She is doing well on the Nortriptyline and propranolol. She has tiredness to the nortriptyline, slowly titrate off. Every week go down a pill. Will stay on the propranolol. Will try Cambia instead of diclofenac.  Interval history 12/19/2016:Patient is here for  follow-up of chronic migraines. She is on nortriptyline and propranolol. Topamax was tried in the past the patient had side effects. Propranolol was increased at last appointment to 60 mg a day. We tried to increase the nortriptyline in the past but had joint pain. She had side effects to trip him. She takes Phenergan for headaches and diclofenac, I have discussed rebound headache and need to limit these. Relpax gave her chest pain and tightness. Amitriptyline made her too drowsy increased caffeine makes her headaches worse. Imitrex caused her neck and shoulder pain. Patient's headaches are described as throbbing, light sensitivity, sound sensitivity, neck and shoulder muscular pain, mostly on the right but can be on the left. She has been sleeping more and drinking caffeine which are triggers. She is fine today, no headache but has been suffering on and off for the last 3 weeks with migraines. They start after having coffee, would switch to decaf. And she has pain behind the eyes. She is having pain right now.   She was doing well for 4-5 months since last being seen. She started having a headache 3 weeks ago, she is having a lot of aura (does not always have an aura). 3 weeks ago had migraines all weekend. She has increased her Nortriptyline to 3 pills at night and propranolol to 20mg  tid. Headaches for the last 5 days as well.   Medications tried: Nortriptyline(on low dose could not increase doe), Amitriptyline(too sleepy), propranolol(on ER currently), Topamax, Relpax(chest pain and tightness), amitriptyline, Imitrex, Phenergan and diclofenac for acute management.  Interval history 05/25/2016: She is on nortriptyline  and propranolol and this has helped with the migraines. She is still having chronic migraines 4-5 in the last 2 weeks but she has been fasting and thinks dehydration is affecting. Before this the headaches were better she was having them 1-2x a month. For 3 months she had no headaches at  all. Really just an increase in the last few weeks likely due to fasting. She takes cambia. She was sensitive to imitrex and relpax. With the relpax her blood pressure went up and she had jaw pain. She went to the emergency room, tongue swelling. She tried to increase the Nortriptyline and had joint pain. She is on 20mg  mortriptyline. She had side effects to trokendi. She takes phenergan for the headaches. She takes diclofenac 4-5x a week no more (can cause rebound headache)  Interval history 11/28/2015; She has worsening headaches. She is crying today, in the last 3 weeks over 8 migraines. Relpax gave her side effects, chest pain and tightness. She started Amitriptyline, she didn't have a headache for a few months. She was having insomnia and amitrip helped a lot but made her too drowy. Sleep and increased caffeine makes her headaches worse. Yesterday she had a very bad migraine. She has had more than 8 migraines in the last 3 weeks.   HPI: Susan Hopkins is a 47 y.o. female here as a referral from Dr. Jimmey RalphParker for Migraine  Migraines started 7 years ago. Used to have it 1-2x a year. Was worse with pregnancy. This last month she has pain on the right side of the head continuously. Throbbing. endorses Light sensitivity, sound sensitivity. Has wavy light aura. She has them 2x a month. This month she has had it a at a low level all month, always there. She takes tylenol, ibuprofen daily. She has neck and shoulder muscular pain. Imitrex doesn't work, makes her shoulder sore, hands hurt. She has nausea and vomiting with the headaches. She went back to school this month, that may be exacerbating the headaches. She has a new glasses prescription. She has 4 kids. No more kids, has an IUD. She gets to sleep but everything wakes her up. She can't nap during the day. No snoring. Migraine can be severe. Can be 10/10. 8-10 hours.      Social History   Socioeconomic History  . Marital status: Married    Spouse  name: Roseanne RenoHassan  . Number of children: 4  . Years of education: Ba  . Highest education level: Not on file  Social Needs  . Financial resource strain: Not on file  . Food insecurity - worry: Not on file  . Food insecurity - inability: Not on file  . Transportation needs - medical: Not on file  . Transportation needs - non-medical: Not on file  Occupational History  . Occupation: [employer]  Tobacco Use  . Smoking status: Never Smoker  . Smokeless tobacco: Never Used  Substance and Sexual Activity  . Alcohol use: No  . Drug use: No  . Sexual activity: Yes    Birth control/protection: IUD  Other Topics Concern  . Not on file  Social History Narrative   Lives at home with husband and 4 kids.   Right handed.    Caffeine use: Coffee (1-2 cups/day)    Family History  Problem Relation Age of Onset  . Diabetes Mother   . Hypertension Mother   . Hyperlipidemia Mother   . Hypertension Father   . Hyperlipidemia Father   . Prostate cancer Father   .  Diabetes Father   . Anesthesia problems Neg Hx   . Hypotension Neg Hx   . Malignant hyperthermia Neg Hx   . Pseudochol deficiency Neg Hx     Past Medical History:  Diagnosis Date  . ADVANCED MATERNAL AGE 47/02[birth year]2008  . ANEMIA, IRON DEFICIENCY, UNSPEC. 02/12/2007   after IUD  . Constipation   . Contact dermatitis 08/27/2011  . Diabetes mellitus without complication (HCC)    gestational diab. currently & with infant #2  . DIABETES MELLITUS, GESTATIONAL, HX OF 05/18/2008  . Diabetes mellitus, type II (HCC)    Hbg A1C 6.9 after pregancy, now on Metformin  . Diarrhea   . 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 Gastric ulcer    history of  . GERD (gastroesophageal reflux disease)    doesn't take anything for acid reflux  . Gestational diabetes A2/B 07/20/2012   Fetal echo-nml-under media tab, needs baseline labs-nml with nml TSH and 24 hour urine-68mg . To see optho in Nov 2013-Per pt. Ok,  serial u/s for growth, 2x/wk testing at 32 wks.   . Hemorrhoids   . 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 IUD complication (HCC) 07/28/2015  . Migraine headache    last migraine in Sept 2012    Past Surgical History:  Procedure Laterality Date  . CESAREAN SECTION N/A 01/22/2013   Procedure: CESAREAN SECTION;  Surgeon: Reva Bores, MD;  Location: [facility];  Service: Obstetrics;  Laterality: N/A;  Repeat  . CESAREAN SECTION  2005  . CESAREAN SECTION  2010  . CHOLECYSTECTOMY  11/28/2011   Procedure: LAPAROSCOPIC CHOLECYSTECTOMY WITH INTRAOPERATIVE CHOLANGIOGRAM;  Surgeon: Wilmon Arms. Corliss Skains, MD;  Location: [facility] OR;  Service: General;  Laterality: N/A;  . DILATION AND CURETTAGE OF UTERUS  2009  . female circumcision  22  . TONSILLECTOMY  1989    Current Outpatient Medications  Medication Sig Dispense Refill  . diclofenac (CATAFLAM) 50 MG tablet TAKE ONE TABLET BY MOUTH ONCE DAILY AS NEEDED FOR ONSET OF HEADACHE. TAKE WITH FOOD 30 tablet 11  . fluticasone (FLONASE) 50 MCG/ACT nasal spray Place 2 sprays into both nostrils daily. (Patient taking differently: Place 2 sprays into both nostrils daily as needed. ) 16 g 3  . Fremanezumab-vfrm (AJOVY) 225 MG/1.5ML SOSY Inject 225 mg into the skin every 30 (thirty) days. 1 Syringe 11  . hydrochlorothiazide (HYDRODIURIL) 25 MG tablet Take 0.5 tablets (12.5 mg total) by mouth daily. 45 tablet 6  . metFORMIN (GLUCOPHAGE) 500 MG tablet Take 1 tablet (500 mg total) by mouth daily with breakfast. (Patient taking differently: Take 1,000 mg by mouth daily with breakfast. ) 90 tablet 1  . nortriptyline (PAMELOR) 10 MG capsule Take 3 capsules (30 mg total) by mouth at bedtime. 90 capsule 11  . promethazine (PHENERGAN) 25 MG tablet Take 1 tablet (25 mg total) by mouth every 8 (eight) hours as needed for nausea or vomiting. 30 tablet 0  . propranolol ER (INDERAL LA) 60 MG 24 hr capsule Take 1 capsule (60 mg total) by mouth at bedtime. 30 capsule 12  . traMADol  (ULTRAM) 50 MG tablet Take 1 tablet (50 mg total) by mouth every 6 (six) hours as needed. 15 tablet 5   Current Facility-Administered Medications  Medication Dose Route Frequency Provider Last Rate Last Dose  . Fremanezumab-vfrm SOSY 225 mg  225 mg Subcutaneous Once Anson Fret, MD        Allergies as of 01/26/2018 - Review Complete 01/26/2018  Allergen Reaction Noted  . Relpax [eletriptan] Shortness Of Breath 11/14/2015  . Oxycodone Itching 12/26/2011    Vitals: BP 111/65 (BP Location: Left Arm, Patient Position: Sitting)   Pulse 77   Ht 5\' 6"  (1.676 m)   Wt 186 lb (84.4 kg)   BMI 30.02 kg/m  Last Weight:  Wt Readings from Last 1 Encounters:  01/26/18 186 lb (84.4 kg)   Last Height:   Ht Readings from Last 1 Encounters:  01/26/18 5\' 6"  (1.676 m)   Physical exam: Exam: Gen: NAD, conversant, well nourised, obese, well groomed                     CV: RRR, no MRG. No Carotid Bruits. No peripheral edema, warm, nontender Eyes: Conjunctivae clear without exudates or hemorrhage  Neuro: Detailed Neurologic Exam  Speech:    Speech is normal; fluent and spontaneous with normal comprehension.  Cognition:    The patient is oriented to person, place, and time;     recent and remote memory intact;     language fluent;     normal attention, concentration,     fund of knowledge Cranial Nerves:    The pupils are equal, round, and reactive to light. The fundi are normal and spontaneous venous pulsations are present. Visual fields are full to finger confrontation. Extraocular movements are intact. Trigeminal sensation is intact and the muscles of mastication are normal. The face is symmetric. The palate elevates in the midline. Hearing intact. Voice is normal. Shoulder shrug is normal. The tongue has normal motion without fasciculations.   Coordination:    Normal finger to nose and heel to shin. Normal rapid alternating movements.   Gait:    Heel-toe and tandem gait are normal.     Motor Observation:    No asymmetry, no atrophy, and no involuntary movements noted. Tone:    Normal muscle tone.    Posture:    Posture is normal. normal erect    Strength:    Strength is V/V in the upper and lower limbs.      Sensation: intact to LT     Reflex Exam:  DTR's:    Deep tendon reflexes in the upper and lower extremities are normal bilaterally.   Toes:    The toes are downgoing bilaterally.   Clonus:    Clonus is absent.   Assessment/Plan: 47 year old female with migraines. She is on propranolol and nortriptyline.  diclofenac pills for acute management, Phenergan for nausea she cannot tolerate triptan's.  Medication overuse: taking multiple analgesics and caffeine. Had a long discussion, counseled and provided literature. Asked her to come off of these medications and if headaches continue will repeat MRi brain.   MRI of the brain was normal Diclofenac for acute management, phenergan for nausea (had tongue swelling with triptans) Continue propranol to 60mg  at night ER Continua  Nortriptyline 30mg , if improved on Ajovy or prozac can titrate off Discussed botox for migraine, decided on CGRP antagonists Other medications we can try are Depakote or Cymbalta Discussed Aimovig and Ajovy - will start Ajovy today Discussed risk of seratonin syndrome, she understands risks, both medications are low dose (nortriptyline, Prozac) Prozac for depression, new problem today discussed - she stopped it herself Recommended counselor, she is following up soon with pcp for this as well Stop tramadol, discussed with patient  Discussed: To prevent or relieve headaches, try the following: Cool Compress. Lie down and place a cool compress on your head.  Avoid headache triggers. If certain foods or odors seem to have triggered your migraines in the past, avoid them. A headache diary might help you identify triggers.  Include physical activity in your daily routine. Try a daily walk  or other moderate aerobic exercise.  Manage stress. Find healthy ways to cope with the stressors, such as delegating tasks on your to-do list.  Practice relaxation techniques. Try deep breathing, yoga, massage and visualization.  Eat regularly. Eating regularly scheduled meals and maintaining a healthy diet might help prevent headaches. Also, drink plenty of fluids.  Follow a regular sleep schedule. Sleep deprivation might contribute to headaches Consider biofeedback. With this mind-body technique, you learn to control certain bodily functions - such as muscle tension, heart rate and blood pressure - to prevent headaches or reduce headache pain.    Proceed to emergency room if you experience new or worsening symptoms or symptoms do not resolve, if you have new neurologic symptoms or if headache is severe, or for any concerning symptom.   Provided education and documentation from American headache Society toolbox including articles on: chronic migraine medication overuse headache, chronic migraines, prevention of migraines, behavioral and other nonpharmacologic treatments for headache.   Naomie Dean, MD  [facility] [address] [place], [place] [address]  Phone [phone] Fax [phone]  A total of 30 minutes was spent face-to-face with this patient. Over half this time was spent on counseling patient on the chronic migraine, depression diagnosis and different diagnostic and therapeutic options available.

## 2018-01-26 NOTE — Patient Instructions (Signed)
Analgesic Rebound Headache An analgesic rebound headache, sometimes called a medication overuse headache, is a headache that comes after pain medicine (analgesic) taken to treat the original (primary) headache has worn off. Any type of primary headache can return as a rebound headache if a person regularly takes analgesics more than three times a week to treat it. The types of primary headaches that are commonly associated with rebound headaches include:  Migraines.  Headaches that arise from tense muscles in the head and neck area (tension headaches).  Headaches that develop and happen again (recur) on one side of the head and around the eye (cluster headaches).  If rebound headaches continue, they become chronic daily headaches. What are the causes? This condition may be caused by frequent use of:  Over-the-counter medicines such as aspirin, ibuprofen, and acetaminophen.  Sinus relief medicines and other medicines that contain caffeine.  Narcotic pain medicines such as codeine and oxycodone.  What are the signs or symptoms? The symptoms of a rebound headache are the same as the symptoms of the original headache. Some of the symptoms of specific types of headaches include: Migraine headache  Pulsing or throbbing pain on one or both sides of the head.  Severe pain that interferes with daily activities.  Pain that is worsened by physical activity.  Nausea, vomiting, or both.  Pain with exposure to bright light, loud noises, or strong smells.  General sensitivity to bright light, loud noises, or strong smells.  Visual changes.  Numbness of one or both arms. Tension headache  Pressure around the head.  Dull, aching head pain.  Pain felt over the front and sides of the head.  Tenderness in the muscles of the head, neck, and shoulders. Cluster headache  Severe pain that begins in or around one eye or temple.  Redness and tearing in the eye on the same side as the  pain.  Droopy or swollen eyelid.  One-sided head pain.  Nausea.  Runny nose.  Sweaty, pale facial skin.  Restlessness. How is this diagnosed? This condition is diagnosed by:  Reviewing your medical history. This includes the nature of your primary headaches.  Reviewing the types of pain medicines that you have been using to treat your headaches and how often you take them.  How is this treated? This condition may be treated or managed by:  Discontinuing frequent use of the analgesic medicine. Doing this may worsen your headaches at first, but the pain should eventually become more manageable, less frequent, and less severe.  Seeing a headache specialist. He or she may be able to help you manage your headaches and help make sure there is not another cause of the headaches.  Using methods of stress relief, such as acupuncture, counseling, biofeedback, and massage. Talk with your health care provider about which methods might be good for you.  Follow these instructions at home:  Take over-the-counter and prescription medicines only as told by your health care provider.  Stop the repeated use of pain medicine as told by your health care provider. Stopping can be difficult. Carefully follow instructions from your health care provider.  Avoid triggers that are known to cause your primary headaches.  Keep all follow-up visits as told by your health care provider. This is important. Contact a health care provider if:  You continue to experience headaches after following treatments that your health care provider recommended. Get help right away if:  You develop new headache pain.  You develop headache pain that is different   than what you have experienced in the past.  You develop numbness or tingling in your arms or legs.  You develop changes in your speech or vision. This information is not intended to replace advice given to you by your health care provider. Make sure you  discuss any questions you have with your health care provider. Document Released: 02/22/2004 Document Revised: 06/21/2016 Document Reviewed: 05/06/2016 Elsevier Interactive Patient Education  2018 Elsevier Inc[employer]  

## 2018-01-27 ENCOUNTER — Encounter: Payer: Self-pay | Admitting: *Deleted

## 2018-01-27 ENCOUNTER — Encounter: Payer: Self-pay | Admitting: Neurology

## 2018-02-24 ENCOUNTER — Telehealth: Payer: Self-pay | Admitting: Neurology

## 2018-02-24 NOTE — Telephone Encounter (Signed)
Order form reviewed and signed by Dr. Lucia GaskinsAhern. Also printed copy of insurance card. Given to [employer] Corporationina RN in infusion. Pt will come around 1 pm.

## 2018-02-24 NOTE — Telephone Encounter (Signed)
D/w Dr. Lucia GaskinsAhern. She has authorized an infusion. Per Inetta Fermoina in infusion, pt can be seen after 1 pm.   Called the patient and discussed migraine. She stated that it started on the right side of her head. Yesterday the pain was on the left side. She reports nausea and vomiting, sensitivity to light, but denies other neurological changes. She will have a driver. Verified allergies with pt on the phone. Order form is ready for MD.

## 2018-02-24 NOTE — Telephone Encounter (Signed)
Patient has had a migraine x 3 days and would like to come to the office for an infusion.

## 2018-03-02 ENCOUNTER — Encounter: Payer: Self-pay | Admitting: Neurology

## 2018-04-03 NOTE — Progress Notes (Signed)
[facility]  PATIENT: Susan Hopkins DOB: November 09, 1971   REASON FOR VISIT: Follow-up for daily migraine  HISTORY FROM: Patient    HISTORY OF PRESENT ILLNESS:UPDATE  4/22/2019CM [name], 47 year old female returns for follow-up with continued daily headache.  She continues to have daily migraine.  She is on Ajovy and has stopped her nortriptyline which was beneficial.  She continues to take over-the-counter preparations and was cautioned against this due to rebound.  She has been asked to stop tramadol altogether.  She also has seasonal allergies.  She says that Phenergan causes more anxiety and restlessness.  She is nauseated and does vomit at times with her migraines she returns for reevaluation  Interval history 01/26/2018: Migraines have worsened. Started Ajovy. Migraines are more frequent, she is using tylenol, ibuprofen, tramadol and diclofenac every day. She is using excedrin and multiple other medications. Counseled her on Medication Overuse.  Had a long discussion, provided literature to read. Continue Ajovy.   Interval history 2018: She has migraines 4x a week. They can last 2 days. She stopped Nortriptyline and the headaches got worse. She has less than 10 headache free days, 10 migraine days a month. 4 days are severe migrainous and a few other days are migrainous but mild. Worse if she forgets the Nortriptyline.  Propranolol also helps esp with palpitations, she feels worse if she forgets. She is feeling very depressed, she cries in the office today. She feels lonely, she doesn't have any family here and it is hard to raise children here. She feels sad most days. She cries sometimes at home. No suicidal ideation. She has friends here, she has a close friend here. She has had these feelings for a long time. She has an appointment with her pcp next week.   Interval 05/20/2017:Patient returns today for follow-up of migraines on nortriptyline and propranolol. Had  side effects to Topamax. We were unable to increase her nortriptyline due to side effects. She takes Phenergan and diclofenac for acute management.In one month she has 2-3 migraines and if she takes the diclofenac and phenergan during the aura she does not get the migraines and she sleeps well and she is fine. She is doing well on the Nortriptyline and propranolol. She has tiredness to the nortriptyline, slowly titrate off. Every week go down a pill. Will stay on the propranolol. Will try Cambia instead of diclofenac.  Interval history 12/19/2016:Patient is here for follow-up of chronic migraines. She is on nortriptyline and propranolol. Topamax was tried in the past the patient had side effects. Propranolol was increased at last appointment to 60 mg a day. We tried to increase the nortriptyline in the past but had joint pain. She had side effects to trip him. She takes Phenergan for headaches and diclofenac, I have discussed rebound headache and need to limit these. Relpax gave her chest pain and tightness. Amitriptyline made her too drowsy increased caffeine makes her headaches worse. Imitrex caused her neck and shoulder pain. Patient's headaches are described as throbbing, light sensitivity, sound sensitivity, neck and shoulder muscular pain, mostly on the right but can be on the left. She has been sleeping more and drinking caffeine which are triggers. She is fine today, no headache but has been suffering on and off for the last 3 weeks with migraines. They start after having coffee, would switch to decaf. And she has pain behind the eyes. She is having pain right now.   She was doing well for 4-5 months since last  being seen. She started having a headache 3 weeks ago, she is having a lot of aura (does not always have an aura). 3 weeks ago had migraines all weekend. She has increased her Nortriptyline to 3 pills at night and propranolol to 20mg  tid. Headaches for the last 5 days as well.   Medications  tried: Nortriptyline(on low dose could not increase doe),Amitriptyline(too sleepy),propranolol(on ER currently), Topamax, Relpax(chest pain and tightness), amitriptyline, Imitrex, Phenergananddiclofenacfor acute management.  Interval history 05/25/2016: She is on nortriptyline and propranolol and this has helped with the migraines. She is still having chronic migraines 4-5 in the last 2 weeks but she has been fasting and thinks dehydration is affecting. Before this the headaches were better she was having them 1-2x a month. For 3 months she had no headaches at all. Really just an increase in the last few weeks likely due to fasting. She takes cambia. She was sensitive to imitrex and relpax. With the relpax her blood pressure went up and she had jaw pain. She went to the emergency room, tongue swelling. She tried to increase the Nortriptyline and had joint pain. She is on 20mg  mortriptyline. She had side effects to trokendi. She takes phenergan for the headaches. She takes diclofenac 4-5x a week no more (can cause rebound headache)  Interval history 11/28/2015; She has worsening headaches. She is crying today, in the last 3 weeks over 8 migraines. Relpax gave her side effects, chest pain and tightness. She started Amitriptyline, she didn't have a headache for a few months. She was having insomnia and amitrip helped a lot but made her too drowy. Sleep and increased caffeine makes her headaches worse. Yesterday she had a very bad migraine. She has had more than 8 migraines in the last 3 weeks.   HPI: [name] is a 47 y.o. female here as a referral from Dr. Jimmey Ralph for Migraine  Migraines started 7 years ago. Used to have it 1-2x a year. Was worse with pregnancy. This last month she has pain on the right side of the head continuously. Throbbing. endorses Light sensitivity, sound sensitivity. Has wavy light aura. She has them 2x a month. This month she has had it a at a low level all month, always  there. She takes tylenol, ibuprofen daily. She has neck and shoulder muscular pain. Imitrex doesn't work, makes her shoulder sore, hands hurt. She has nausea and vomiting with the headaches. She went back to school this month, that may be exacerbating the headaches. She has a new glasses prescription. She has 4 kids. No more kids, has an IUD. She gets to sleep but everything wakes her up. She can't nap during the day. No snoring. Migraine can be severe. Can be 10/10. 8-10 hours.     REVIEW OF SYSTEMS: Full 14 system review of systems performed and notable only for those listed, all others are neg:  Constitutional: neg  Cardiovascular: neg Ear/Nose/Throat: neg  Skin: neg Eyes: neg Respiratory: neg Gastroitestinal: neg  Hematology/Lymphatic: neg  Endocrine: neg Musculoskeletal:neg Allergy/Immunology: neg Neurological: Daily migraine  Psychiatric: neg Sleep : neg   ALLERGIES: Allergies  Allergen Reactions  . Relpax [Eletriptan] Shortness Of Breath    Throat swelling  . Oxycodone Itching    All over the body  . Phenergan [Promethazine Hcl]     Restless, anxious    HOME MEDICATIONS: Outpatient Medications Prior to Visit  Medication Sig Dispense Refill  . diclofenac (CATAFLAM) 50 MG tablet TAKE ONE TABLET BY MOUTH ONCE DAILY AS  NEEDED FOR ONSET OF HEADACHE. TAKE WITH FOOD 30 tablet 11  . Fremanezumab-vfrm (AJOVY) 225 MG/1.5ML SOSY Inject 225 mg into the skin every 30 (thirty) days. 1 Syringe 11  . hydrochlorothiazide (HYDRODIURIL) 25 MG tablet Take 0.5 tablets (12.5 mg total) by mouth daily. 45 tablet 6  . metFORMIN (GLUCOPHAGE) 500 MG tablet Take 1 tablet (500 mg total) by mouth daily with breakfast. (Patient taking differently: Take 1,000 mg by mouth daily with breakfast. ) 90 tablet 1  . propranolol ER (INDERAL LA) 60 MG 24 hr capsule Take 1 capsule (60 mg total) by mouth at bedtime. 30 capsule 12  . fluticasone (FLONASE) 50 MCG/ACT nasal spray Place 2 sprays into both nostrils  daily. (Patient not taking: Reported on 04/06/2018) 16 g 3  . promethazine (PHENERGAN) 25 MG tablet Take 1 tablet (25 mg total) by mouth every 8 (eight) hours as needed for nausea or vomiting. (Patient not taking: Reported on 04/06/2018) 30 tablet 0  . traMADol (ULTRAM) 50 MG tablet Take 1 tablet (50 mg total) by mouth every 6 (six) hours as needed. (Patient not taking: Reported on 04/06/2018) 15 tablet 5  . nortriptyline (PAMELOR) 10 MG capsule Take 3 capsules (30 mg total) by mouth at bedtime. 90 capsule 11   Facility-Administered Medications Prior to Visit  Medication Dose Route Frequency Provider Last Rate Last Dose  . Fremanezumab-vfrm SOSY 225 mg  225 mg Subcutaneous Once Anson Fret, MD        PAST MEDICAL HISTORY: Past Medical History:  Diagnosis Date  . ADVANCED MATERNAL AGE 50/02/2008  . ANEMIA, IRON DEFICIENCY, UNSPEC. 02/12/2007   after IUD  . Constipation   . Contact dermatitis 08/27/2011  . Diabetes mellitus without complication (HCC)    gestational diab. currently & with infant #2  . DIABETES MELLITUS, GESTATIONAL, HX OF 05/18/2008  . Diabetes mellitus, type II (HCC)    Hbg A1C 6.9 after pregancy, now on Metformin  . Diarrhea   . 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 Gastric ulcer    history of  . GERD (gastroesophageal reflux disease)    doesn't take anything for acid reflux  . Gestational diabetes A2/B 07/20/2012   Fetal echo-nml-under media tab, needs baseline labs-nml with nml TSH and 24 hour urine-68mg . To see optho in Nov 2013-Per pt. Ok, serial u/s for growth, 2x/wk testing at 32 wks.   . Hemorrhoids   . 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 IUD complication (HCC) 07/28/2015  . Migraine headache    last migraine in Sept 2012    PAST SURGICAL HISTORY: Past Surgical History:  Procedure Laterality Date  . CESAREAN SECTION N/A 01/22/2013   Procedure: CESAREAN SECTION;  Surgeon: Reva Bores, MD;  Location: [facility]  ORS;  Service: Obstetrics;  Laterality: N/A;  Repeat  . CESAREAN SECTION  2005  . CESAREAN SECTION  2010  . CHOLECYSTECTOMY  11/28/2011   Procedure: LAPAROSCOPIC CHOLECYSTECTOMY WITH INTRAOPERATIVE CHOLANGIOGRAM;  Surgeon: Wilmon Arms. Corliss Skains, MD;  Location: [facility] OR;  Service: General;  Laterality: N/A;  . DILATION AND CURETTAGE OF UTERUS  2009  . female circumcision  29  . TONSILLECTOMY  1989    FAMILY HISTORY: Family History  Problem Relation Age of Onset  . Diabetes Mother   . Hypertension Mother   . Hyperlipidemia Mother   . Hypertension Father   . Hyperlipidemia Father   . Prostate cancer Father   . Diabetes Father   . Anesthesia problems Neg Hx   .  Hypotension Neg Hx   . Malignant hyperthermia Neg Hx   . Pseudochol deficiency Neg Hx     SOCIAL HISTORY: Social History   Socioeconomic History  . Marital status: Married    Spouse name: [name]  . Number of children: 4  . Years of education: Ba  . Highest education level: Not on file  Occupational History  . Occupation: [employer]  Social Needs  . Financial resource strain: Not on file  . Food insecurity:    Worry: Not on file    Inability: Not on file  . Transportation needs:    Medical: Not on file    Non-medical: Not on file  Tobacco Use  . Smoking status: Never Smoker  . Smokeless tobacco: Never Used  Substance and Sexual Activity  . Alcohol use: No  . Drug use: No  . Sexual activity: Yes    Birth control/protection: IUD  Lifestyle  . Physical activity:    Days per week: Not on file    Minutes per session: Not on file  . Stress: Not on file  Relationships  . Social connections:    Talks on phone: Not on file    Gets together: Not on file    Attends religious service: Not on file    Active member of club or organization: Not on file    Attends meetings of clubs or organizations: Not on file    Relationship status: Not on file  . Intimate partner violence:    Fear of current or ex partner: Not on file     Emotionally abused: Not on file    Physically abused: Not on file    Forced sexual activity: Not on file  Other Topics Concern  . Not on file  Social History Narrative   Lives at home with husband and 4 kids.   Right handed.    Caffeine use: Coffee (1-2 cups/day)     PHYSICAL EXAM  Vitals:   04/06/18 0801  BP: 122/74  Pulse: 70  Weight: 184 lb 6.4 oz (83.6 kg)  Height: 5\' 6"  (1.676 m)   Body mass index is 29.76 kg/m.  Generalized: Well developed, in no acute distress  Head: normocephalic and atraumatic,. Oropharynx benign  Neck: Supple,  Musculoskeletal: No deformity   Neurological examination   Mentation: Alert oriented to time, place, history taking. Attention span and concentration appropriate. Recent and remote memory intact.  Follows all commands speech and language fluent.   Cranial nerve II-XII: Pupils were equal round reactive to light extraocular movements were full, visual field were full on confrontational test. Facial sensation and strength were normal. hearing was intact to finger rubbing bilaterally. Uvula tongue midline. head turning and shoulder shrug were normal and symmetric.Tongue protrusion into cheek strength was normal. Motor: normal bulk and tone, full strength in the BUE, BLE, fine finger movements normal, Sensory: normal and symmetric to light touch,  Coordination: finger-nose-finger, heel-to-shin bilaterally, no dysmetria Reflexes: Symmetric upper and lower, plantar responses were flexor bilaterally. Gait and Station: Rising up from seated position without assistance, normal stance,  moderate stride, good arm swing, smooth turning, able to perform tiptoe, and heel walking without difficulty. Tandem gait is steady  DIAGNOSTIC DATA (LABS, IMAGING, TESTING) - I reviewed patient records, labs, notes, testing and imaging myself where available.  Lab Results  Component Value Date   WBC 6.3 05/07/2016   HGB 11.4 (L) 05/07/2016   HCT 36.3 05/07/2016    MCV 74.5 (L) 05/07/2016   PLT 299  05/07/2016      Component Value Date/Time   NA 138 10/29/2016 1542   NA 140 03/09/2015 1356   K 3.7 10/29/2016 1542   CL 105 10/29/2016 1542   CO2 28 10/29/2016 1542   GLUCOSE 117 (H) 10/29/2016 1542   BUN 7 10/29/2016 1542   BUN 8 03/09/2015 1356   CREATININE 0.45 (L) 10/29/2016 1542   CALCIUM 8.8 10/29/2016 1542   PROT 6.9 09/25/2015 1246   PROT 7.0 03/09/2015 1356   ALBUMIN 4.2 09/25/2015 1246   ALBUMIN 4.2 03/09/2015 1356   AST 14 09/25/2015 1246   ALT 14 09/25/2015 1246   ALKPHOS 61 09/25/2015 1246   BILITOT 0.3 09/25/2015 1246   BILITOT 0.2 03/09/2015 1356   GFRNONAA >60 11/05/2015 2116   GFRAA >60 11/05/2015 2116    Lab Results  Component Value Date   HGBA1C 6.7 09/03/2017   Lab Results  Component Value Date   VITAMINB12 336 05/07/2016   Lab Results  Component Value Date   TSH 2.38 05/07/2016      ASSESSMENT AND PLAN 47 year old female with migraines.She is on propranolol but stopped nortriptyline   diclofenac pills for acute management, Phenergan for nausea she cannot tolerate triptan's.  She has not stopped tramadol as directed.  Continues with medication overuse with multiple analgesics and caffeine once again counseled regarding this.    PLAN: Continue Diclofenac for acute management, Zofran  for nausea Phenergan caused increased anxiety (had tongue swelling with triptans) Continuepropranol to 60mg  at night ER Restart  Nortriptyline 10mg  at night for 1 week then increase by 10mg  weekly until taking 3 at night Continue Ajovy as directed Other medications we can try are Depakote or Cymbalta Given a list of all foods that her migraine triggers and reviewed these with her F/U in 3 months I spent 25 minutes in total face to face time with the patient more than 50% of which was spent counseling and coordination of care, reviewing test results reviewing medications and discussing and reviewing the diagnosis of migraine,  common migraine triggers which are foods and medication overuse from multiple analgesics. Nilda RiggsNancy Carolyn Ankur Snowdon, [facility] Memorial HospitalGNP, [facility] HospitalBC, APRN  [facility] [address], [address] [place], [place] [address] [phone]

## 2018-04-06 ENCOUNTER — Ambulatory Visit (INDEPENDENT_AMBULATORY_CARE_PROVIDER_SITE_OTHER): Payer: BLUE CROSS/BLUE SHIELD | Admitting: Nurse Practitioner

## 2018-04-06 ENCOUNTER — Encounter: Payer: Self-pay | Admitting: Nurse Practitioner

## 2018-04-06 ENCOUNTER — Telehealth: Payer: Self-pay | Admitting: Nurse Practitioner

## 2018-04-06 ENCOUNTER — Other Ambulatory Visit: Payer: Self-pay | Admitting: *Deleted

## 2018-04-06 VITALS — BP 122/74 | HR 70 | Ht 66.0 in | Wt 184.4 lb

## 2018-04-06 DIAGNOSIS — G43109 Migraine with aura, not intractable, without status migrainosus: Secondary | ICD-10-CM

## 2018-04-06 DIAGNOSIS — R519 Headache, unspecified: Secondary | ICD-10-CM

## 2018-04-06 DIAGNOSIS — J302 Other seasonal allergic rhinitis: Secondary | ICD-10-CM

## 2018-04-06 DIAGNOSIS — R51 Headache: Secondary | ICD-10-CM

## 2018-04-06 MED ORDER — NORTRIPTYLINE HCL 10 MG PO CAPS: 10.0000 mg | ORAL_CAPSULE | Freq: Every day | ORAL | 3 refills | Status: DC

## 2018-04-06 MED ORDER — ONDANSETRON HCL 4 MG PO TABS: 4.0000 mg | ORAL_TABLET | Freq: Three times a day (TID) | ORAL | 3 refills | Status: DC | PRN

## 2018-04-06 NOTE — Patient Instructions (Signed)
Diclofenac for acute management, Zofran  for nausea (had tongue swelling with triptans) Do not use tramadol  Continuepropranol to 60mg  at night ER Restart  Nortriptyline 10mg  at night for 1 week then increase by 10mg  weekly until taking 3 at night Continue Ajovy as directed Other medications we can try are Depakote or Cymbalta Recommended counselor, she is following up soon with pcp for this as well F/U in 3 months

## 2018-04-06 NOTE — Telephone Encounter (Signed)
Pt. States she will call us back to schedule her 3 mo f/u

## 2018-04-24 NOTE — Progress Notes (Signed)
Personally  participated in, made any corrections needed, and agree with history, physical, neuro exam,assessment and plan as stated above.    Louann Hopson, MD [facility] 

## 2018-05-04 ENCOUNTER — Other Ambulatory Visit: Payer: Self-pay

## 2018-05-04 ENCOUNTER — Ambulatory Visit: Payer: BLUE CROSS/BLUE SHIELD | Admitting: Family Medicine

## 2018-05-04 ENCOUNTER — Encounter: Payer: Self-pay | Admitting: Family Medicine

## 2018-05-04 VITALS — BP 104/62 | HR 80 | Temp 98.2°F | Wt 181.0 lb

## 2018-05-04 DIAGNOSIS — R5382 Chronic fatigue, unspecified: Secondary | ICD-10-CM

## 2018-05-04 DIAGNOSIS — E119 Type 2 diabetes mellitus without complications: Secondary | ICD-10-CM | POA: Diagnosis not present

## 2018-05-04 DIAGNOSIS — E559 Vitamin D deficiency, unspecified: Secondary | ICD-10-CM | POA: Diagnosis not present

## 2018-05-04 LAB — POCT GLYCOSYLATED HEMOGLOBIN (HGB A1C): HbA1c, POC (controlled diabetic range): 6.7 % (ref 0.0–7.0)

## 2018-05-04 MED ORDER — DOCUSATE SODIUM 100 MG PO CAPS: 200.0000 mg | ORAL_CAPSULE | Freq: Every day | ORAL | 6 refills | Status: DC

## 2018-05-04 NOTE — Patient Instructions (Signed)
   It was great seeing you today!  Continue metformin as you have been, we'll repeat A1C in 3 months.   We will check labs today including blood counts, thyroid level, and vitamin D level. We will call or send you a letter with the lab results.   If you have questions or concerns please do not hesitate to call at [phone].  Dolores Patty, DO PGY-2, [facility] 05/04/2018 1:42 PM

## 2018-05-04 NOTE — Progress Notes (Signed)
    Subjective:    Patient ID: Susan Hopkins, female    DOB: 10-18-1971, 47 y.o.   MRN: [id number]   CC: follow up DM, c/o fatigue  DM- not taking metformin, reduced carb intake and sugar intake. Will take metformin if she has a heavy meal. Will take it maybe once a week.   Fatigue- reports worsening fatigue over the past few months. Reports she has a history of anemia and vit D deficiency. The fatigue feels similar to the past when her levels were low. Used to take iron supplements but these caused constipation. Has taken vitamin D supplements as well. Has heavy periods still. She denies palpitations, SOB.   HM- declined pneumonia vaccine today. Sees eye dr yearly.   Smoking status reviewed- non-smoker  Review of Systems- see HPI   Objective:  BP 104/62   Pulse 80   Temp 98.2 F (36.8 C) (Oral)   Wt 181 lb (82.1 kg)   LMP 04/17/2018   SpO2 99%   BMI 29.21 kg/m  Vitals and nursing note reviewed  General: well nourished, in no acute distress HEENT: normocephalic, no conjunctival pallor, moist mucous membranes Neck: supple, non-tender, without lymphadenopathy Cardiac: RRR, clear S1 and S2, no murmurs, rubs, or gallops Respiratory: clear to auscultation bilaterally, no increased work of breathing Extremities: no edema or cyanosis Skin: warm and dry, no rashes noted Neuro: alert and oriented, no focal deficits  Assessment & Plan:    1. Type 2 diabetes mellitus without complication, without long-term current use of insulin (HCC) A1C today stable from previous result. At goal < 7. Patient prefers to work on diet and exercise. Feel this is appropriate as she has been off metformin for some time and A1C is stable. Follow up 3 months to repeat A1C.  - HgB A1c  2. Chronic fatigue Likely secondary to anemia as patient has had in past. Will check CBC and TSH today. Encouraged her to take iron. Rx for colace given to help with constipation caused by iron supplement. - CBC -  TSH  3. Vitamin D deficiency Check Vit D level today - VITAMIN D 25 Hydroxy (Vit-D Deficiency, Fractures)   Return in about 3 months (around 08/04/2018), or as needed.   Dolores Patty, DO Family Medicine Resident PGY-2

## 2018-05-05 ENCOUNTER — Other Ambulatory Visit: Payer: Self-pay | Admitting: Family Medicine

## 2018-05-05 LAB — CBC
HEMOGLOBIN: 10.2 g/dL — AB (ref 11.1–15.9)
Hematocrit: 33.4 % — ABNORMAL LOW (ref 34.0–46.6)
MCH: 21.4 pg — AB (ref 26.6–33.0)
MCHC: 30.5 g/dL — AB (ref 31.5–35.7)
MCV: 70 fL — ABNORMAL LOW (ref 79–97)
Platelets: 342 10*3/uL (ref 150–450)
RBC: 4.77 x10E6/uL (ref 3.77–5.28)
RDW: 16.9 % — ABNORMAL HIGH (ref 12.3–15.4)
WBC: 5 10*3/uL (ref 3.4–10.8)

## 2018-05-05 LAB — TSH: TSH: 2.04 u[IU]/mL (ref 0.450–4.500)

## 2018-05-05 LAB — VITAMIN D 25 HYDROXY (VIT D DEFICIENCY, FRACTURES): Vit D, 25-Hydroxy: 16 ng/mL — ABNORMAL LOW (ref 30.0–100.0)

## 2018-05-05 MED ORDER — VITAMIN D3 1.25 MG (50000 UT) PO TABS: 1.0000 | ORAL_TABLET | ORAL | 1 refills | Status: DC

## 2018-05-05 NOTE — Progress Notes (Signed)
Patient informed of results and will pick up script from pharmacy. Quanita Barona,CMA

## 2018-05-05 NOTE — Progress Notes (Signed)
  Please inform patient Vitamin D level low. Sent in prescription for 50,000 units Vitamin D to take once a week over the next 8 weeks then we can recheck level to see if it is responding. Other labs normal- hemoglobin low as has been in past, patient was instructed to take iron daily.   Dolores Patty, DO PGY-2, [facility] 05/05/2018 8:17 AM

## 2018-05-18 ENCOUNTER — Telehealth: Payer: Self-pay

## 2018-05-18 NOTE — Telephone Encounter (Signed)
We received a prior authorization request for Ajovy. I have completed and submitted the PA on Cover My Meds and should have a determination within 48-72 hours.  Cover My Meds Key: [id number]

## 2018-05-19 ENCOUNTER — Ambulatory Visit: Payer: BLUE CROSS/BLUE SHIELD | Admitting: Neurology

## 2018-05-20 NOTE — Telephone Encounter (Signed)
Tony/BC [phone] sent a fax yesterday to [phone], he needs clarification. He will resend the fax today Attn: Sandy/RN  FYI

## 2018-05-20 NOTE — Telephone Encounter (Signed)
Faxed received.

## 2018-05-25 MED ORDER — ERENUMAB-AOOE 140 MG/ML ~~LOC~~ SOAJ: 140.0000 mg | SUBCUTANEOUS | 11 refills | Status: DC

## 2018-05-25 NOTE — Telephone Encounter (Signed)
[facility]. Susan Hopkins has been denied due to the following:  Ajovy is not on the formulary and is covered when two alternative medications on the member's formulary have been tried and did not work: Water quality scientistAimovig and Emgality.

## 2018-05-25 NOTE — Telephone Encounter (Signed)
Attempted to call pt. Her mailbox was full.

## 2018-05-25 NOTE — Addendum Note (Signed)
Addended by: Bertram SavinULBERTSON, Giovana Faciane L on: 05/25/2018 05:02 PM   Modules accepted: Orders

## 2018-05-25 NOTE — Telephone Encounter (Signed)
She can continue to use the copay card or switch her to aimovig. Patient's choice. thanks

## 2018-05-25 NOTE — Addendum Note (Signed)
Addended by: Bertram SavinULBERTSON, BETHANY L on: 05/25/2018 05:11 PM   Modules accepted: Orders

## 2018-05-25 NOTE — Telephone Encounter (Addendum)
Called pt and discussed Ajovy not covered by insurance. Aimovig is preferred. Pt given her two options from Dr. Lucia GaskinsAhern. Pt wanted to switch to Aimovig. She stated that 05/07/18 was when her Ajovy was due but she couldn't get it. Patient aware that she can start the Aimovig as soon as she gets it and to stop the Ajovy. Aimovig will also be given every 30 days. Pt aware the administration is slightly different than Ajovy as it is an autoinjector and there is button to press to inject the needle and medication. Pt advised to review the detailed instructions that will be included with Aimovig and she can go to [email or web address] to request the card and it will be either free or $5. Pt verbalized understanding and appreciation. Pt stated that she did start feeling better after starting Nortriptyline again so she requested to r/s her 6/12 appt for August. Pt r/s to 8/13 @ 8:30, 6/12 appt canceled.   Ajovy d/c, AImovig 140 mg ordered per DR. Lucia GaskinsAhern. Note placed for pharmacy to be aware that Aimovig replacing Ajovy.

## 2018-05-26 ENCOUNTER — Telehealth: Payer: Self-pay

## 2018-05-26 NOTE — Telephone Encounter (Signed)
PA for Aimovig done on cover my meds.  Effective from 05/26/2018 through 08/23/2018.

## 2018-05-27 ENCOUNTER — Ambulatory Visit: Payer: BLUE CROSS/BLUE SHIELD | Admitting: Neurology

## 2018-05-28 ENCOUNTER — Encounter: Payer: Self-pay | Admitting: *Deleted

## 2018-05-28 NOTE — Telephone Encounter (Addendum)
Faxed [facility] an update that Aimovig 140 mg is approved through 08/23/18. Received a receipt of confirmation. Emailed pt as well.

## 2018-06-15 ENCOUNTER — Ambulatory Visit (INDEPENDENT_AMBULATORY_CARE_PROVIDER_SITE_OTHER): Payer: BLUE CROSS/BLUE SHIELD | Admitting: Family Medicine

## 2018-06-15 ENCOUNTER — Other Ambulatory Visit: Payer: Self-pay

## 2018-06-15 ENCOUNTER — Encounter: Payer: Self-pay | Admitting: Family Medicine

## 2018-06-15 VITALS — BP 108/76 | HR 78 | Temp 98.4°F | Ht 66.0 in | Wt 181.2 lb

## 2018-06-15 DIAGNOSIS — W57XXXA Bitten or stung by nonvenomous insect and other nonvenomous arthropods, initial encounter: Secondary | ICD-10-CM | POA: Diagnosis not present

## 2018-06-15 DIAGNOSIS — R21 Rash and other nonspecific skin eruption: Secondary | ICD-10-CM

## 2018-06-15 NOTE — Assessment & Plan Note (Addendum)
Ms. Susan Hopkins presents today with raised erythematous circular lesions that are itchy, non-linear, non-clustered, with no clear dermatologic distribution.  Differential includes dermatitis, eczema, bedbugs, or other insect bites.  Dermatitis and eczema are less likely as distribution pattern is not consistent.  Insect bites from bedbugs are more likely the cause for lesions.  Conservative management today.  Provided information on bedbugs and insect bites in after visit summary.  Patient encouraged to go home and search for possible bedbugs and/or insect infestations.  Symptoms can be treated with over-the-counter hydrocortisone cream or calamine lotion.  Patient encouraged to not scratch the lesions, as that would increase her risk of infection.  If symptoms worsen or last longer than 4 weeks, please schedule an appointment for follow-up.  Return precautions discussed.

## 2018-06-15 NOTE — Progress Notes (Addendum)
Subjective:   MRN [id number]  Date of birth: 15-Feb-1971  RASH  Azucena Ceciluha Lakeman is a 47 y.o. female presents to clinic with complaints of worsening and ascending itchy rash x2 weeks. Patient reports that lesions started on her feet and have since moved up her legs, to her trunk, and has a lesion on her face.  Rash is itchy but nonpainful. Patient reports lesions have a vesicular quality and are intact if she has not scratched at them.  Patient reports trying Benadryl p.o. without relief and has not tried any topical creams.  Patient reports history of dry skin; however, the itchy rash is not a similar quality. Patient denies any recent travel, gardening, new medication, new clothing, exposure to animals.  Patient denies symptoms in husband with whom she shares a bed.  Her 4 children who live in the house have also not experienced any of the symptoms.  Patient's primary concern is the itching that is causing her much discomfort. Patient denies anaphylaxis, edema, dizziness, headaches, GI disturbances, syncope, hypotension, shortness of breath, cough, abnormal heart rhythms.  Medications:  Reviewed and updated.   Allergies:   Reviewed and updated.  Pertinent Past Medical History:  Dry skin DM2 Seasonal allergic rhinitis   Health Maintenance Due  Topic Date Due  . URINE MICROALBUMIN  09/02/1979  . OPHTHALMOLOGY EXAM  11/04/2013    Pertinent Family History:   None  Pertinent Social History:  No recent travel.  No recent outdoor activities.   Review of Systems:  Otherwise negative except for those stated in HPI.      Objective:  Physical Exam:  BP 108/76   Pulse 78   Temp 98.4 F (36.9 C) (Oral)   Ht 5\' 6"  (1.676 m)   Wt 181 lb 3.2 oz (82.2 kg)   SpO2 99%   BMI 29.25 kg/m   Gen: NAD, alert, non-toxic, well-nourished, well-appearing HEENT: Normocephaic, atraumatic. PERRLA, clear conjuctiva, no scleral icterus, no injection. Hearing intact. Neck supple with no LAD, nodules,  or gross abnormality. Nares parent patent with no discharge.  CV: Regular rate and rhythm.  Normal S1-S2.  No murmur, gallops, S3, S4 appreciated.  Normal capillary refill bilaterally. Resp: Clear to auscultation bilaterally.  No wheezing, rales, abnormal lung sounds.  No increased work of breathing appreciated. Abd: Nontender and nondistended on palpation to all 4 quadrants.  Positive bowel sounds. Skin: Lesions are raised, circular and erythematous ranging from 3 to 5 mm in diameter with a central nondraining pustule. Excoriations appreciated leaving some lesions without central pustule and a small hole in its place. Lesions spare soles of hands and feet.  Lesions are present on both flexor and extensor surfaces however there is no clear distribution pattern.  Lesions are non-clustered, nonlinear.  There is no localized edema and surrounding tissues of lesion sites. MSK: Normal range of movement.  No grossly swollen joints appreciated. Neuro: Cranial nerves II through VI grossly intact.  Normal strength and normal tone.  Gait normal.   Psych: Good insight, cooperative with exam.  Normal judgment.    Assessment & Plan:  Bite, insect Ms. Marciano presents today with raised erythematous circular lesions that are itchy, non-linear, non-clustered, with no clear dermatologic distribution.  Differential includes dermatitis, eczema, bedbugs, or other insect bites.  Dermatitis and eczema are less likely as distribution pattern is not consistent.  Insect bites from bedbugs are more likely the cause for lesions.  Conservative management today.  Provided information on bedbugs and insect bites in after  visit summary.  Patient encouraged to go home and search for possible bedbugs and/or insect infestations.  Symptoms can be treated with over-the-counter hydrocortisone cream or calamine lotion.  Patient encouraged to not scratch the lesions, as that would increase her risk of infection.  If symptoms worsen or  last longer than 4 weeks, please schedule an appointment for follow-up.  Return precautions discussed.   Genia Hotter, M.D. 06/15/2018, 8:31 PM PGY-1, [facility]

## 2018-06-15 NOTE — Patient Instructions (Addendum)
It was a pleasure to see you today! Thank you for choosing [facility] for your primary care. Susan Hopkins was seen for itchy rash that has worsened over two weeks. We think that the rash is likely caused by insect bites. This can include bed bugs, spiders, etc. Because of the distribution of the rash and its characteristics, we do not think this is caused by an allergy, a general dermatitis, or eczema.  Our plans for today were:  Avoiding the trigger. We do not know exactly what the trigger is just yet, so we would like you to go home and investigate your home. This includes checking under your bed sheets, looking for insect infestations both indoors and outdoors. Please follow the attached instructions for "Bedbugs". I have also attached instructions for spider bites.  Please avoid itching affected areas as we want to avoid infection.   For symptoms, you can apply over the counter hydrocortisone cream directly to affected spots for itching.   If these symptoms worsen or persist for greater than 4 weeks, please call to make an appointment for follow up.   Best,  Dr. Genia Hotter   Bedbugs  Bedbugs are tiny bugs that live in and around beds. They stay hidden during the day, and they come out at night and bite. Bedbugs need blood to live and grow. Where are bedbugs found? Bedbugs can be found anywhere, whether a place is clean or dirty. They are most often found in places where many people come and go, such as hotels, shelters, dorms, and health care settings. It is also common for them to be found in homes where there are many birds or bats nearby. What are bedbug bites like? A bedbug bite leaves a small red bump with a darker red dot in the middle. The bump may appear soon after a person is bitten or a day or more later. Bedbug bites usually do not hurt, but they may itch. Most people do not need treatment for bedbug bites. The bumps usually go away on their own in a few days. How  do I check for bedbugs? Bedbugs are reddish-brown, oval, and flat. They range in size from 1 mm to 7 mm and they cannot fly. Look for bedbugs in these places:  On mattresses, bed frames, headboards, and box springs.  On drapes and curtains in bedrooms.  Under carpeting in bedrooms.  Behind electrical outlets.  Behind any wallpaper that is peeling.  Inside luggage.  Also look for black or red spots or stains on or near the bed. Stains can come from bedbugs that have been crushed or from bedbug waste. What should I do if I find bedbugs? When Traveling If you find bedbugs while traveling, check all of your possessions carefully before you bring them into your home. Consider throwing away anything that has bedbugs on it. At Home If you find bedbugs at home, your bedroom may need to be treated by a pest control expert. You may also need to throw away mattresses or luggage. To help keep bedbugs from coming back, consider taking these actions:  Put a plastic cover over your mattress.  Wash your clothes and bedding in water that is hotter than 120F (48.9C) and dry them on a hot setting. Bedbugs are killed by high temperatures.  Vacuum often around the bed and in all of the cracks and crevices where the bugs might hide.  Carefully check all used furniture, bedding, or clothes that you bring into your  home.  Eliminate bird nests and bat roosts that are near your home.  In Your Bed If you find bedbugs in your bed, consider wearing pajamas that have long sleeves and pant legs. Bedbugs usually bite areas of the skin that are not covered. This information is not intended to replace advice given to you by your health care provider. Make sure you discuss any questions you have with your health care provider. Document Released: 01/04/2011 Document Revised: 05/09/2016 Document Reviewed: 11/28/2014 Elsevier Interactive Patient Education  2018 [employer]    Spider Bite  Spider bites  are not common. When spider bites do happen, most do not cause serious health problems. There are only a few types of spider bites that can cause serious health problems. What are the causes? A spider bite usually happens when a person accidentally makes contact with a spider in a way that traps the spider against the person's skin. What are the signs or symptoms? Symptoms may vary depending on the type of spider. Some spider bites may cause symptoms within 1 hour after the bite. For other spider bites, it may take 1-2 days for symptoms to develop. Common symptoms include:  Redness and swelling in the area of the bite.  Discomfort or pain in the area of the bite.  A few types of spiders, such as the black widow spider or the brown recluse spider, can inject poison (venom) into a bite wound. This venom causes more serious symptoms. Symptoms of a venomous spider bite vary, and may include:  Muscle cramps.  Nausea, vomiting, or abdominal pain.  Fever.  A skin sore (lesion) that spreads. This can break into an open wound (skin ulcer).  Light-headedness or dizziness.  How is this diagnosed? This condition may be diagnosed based on your symptoms and a physical exam. Your health care provider will ask about the history of your injury and any details you may have about the spider. This may help to determine what type of spider it was that bit you. How is this treated? Many spider bites do not require treatment. If needed, treatment may include:  Icing and keeping the bite area raised (elevated).  Over-the-counter or prescription medicines to help control symptoms.  A tetanus shot.  Antibiotic medicines.  Follow these instructions at home: Medicines  Take or apply over-the-counter and prescription medicines only as told by your health care provider.  If you were prescribed an antibiotic medicine, take or apply it as told by your health care provider. Do not stop using the antibiotic  even if your condition improves. General instructions  Do not scratch the bite area.  Keep the bite area clean and dry. Wash the bite area daily with soap and water as told by your health care provider.  If directed, apply ice to the bite area. ? Put ice in a plastic bag. ? Place a towel between your skin and the bag. ? Leave the ice on for 20 minutes, 2-3 times per day.  Elevate the affected area above the level of your heart while you are sitting or lying down, if possible.  Keep all follow-up visits as told by your health care provider. This is important. Contact a health care provider if:  Your bite does not get better after 3 days of treatment.  Your bite turns black or purple.  You have increased redness, swelling, or pain at the site of the bite. Get help right away if:  You develop shortness of breath or chest  pain.  You have fluid, blood, or pus coming from the bite area.  You have muscle cramps or painful muscle spasms.  You develop abdominal pain, nausea, or vomiting.  You feel unusually tired (fatigued) or sleepy. This information is not intended to replace advice given to you by your health care provider. Make sure you discuss any questions you have with your health care provider. Document Released: 01/09/2005 Document Revised: 07/29/2016 Document Reviewed: 04/19/2015 Elsevier Interactive Patient Education  [employer].

## 2018-06-23 ENCOUNTER — Ambulatory Visit (INDEPENDENT_AMBULATORY_CARE_PROVIDER_SITE_OTHER): Payer: BLUE CROSS/BLUE SHIELD | Admitting: Family Medicine

## 2018-06-23 ENCOUNTER — Other Ambulatory Visit: Payer: Self-pay

## 2018-06-23 ENCOUNTER — Encounter: Payer: Self-pay | Admitting: Family Medicine

## 2018-06-23 DIAGNOSIS — R21 Rash and other nonspecific skin eruption: Secondary | ICD-10-CM

## 2018-06-23 MED ORDER — PREDNISONE 20 MG PO TABS: 40.0000 mg | ORAL_TABLET | Freq: Every day | ORAL | 0 refills | Status: AC

## 2018-06-23 NOTE — Progress Notes (Signed)
  Subjective:   Patient ID: Susan Hopkins    DOB: November 25, 1971, 47 y.o. female   MRN: [id number]  Susan Hopkins is a 47 y.o. female with a history of HTN, GERD, DM2 here for   Rash Endorses itchy rash present for the past 3 weeks that started on both feet and subsequently moved up her legs.  It is now presents on lower legs, with fewer lesions on bilateral arms, trunk, back, and neck.  She was seen for this previously about a week ago and was thought to be related to bug bites.  She has been using over-the-counter hydrocortisone cream without much relief.  She has searched her house and seams of furniture for possible bedbugs but has not found any.  No one else in her house has any rash including her husband with which she shares a bed.  She has a cat that mostly stays outside and denies any new pet exposure.  States the rash itches after bathing.  Denies new soaps, perfumes or detergents.  Denies new medications.  Denies fevers, joint pain or swelling.  Denies personal or family history of autioimmune disorders. Does not think the hydrocortisone cream has made the rash worse, but has not gotten any better with treatment.  Review of Systems:  Per HPI.  MFSH, medications and smoking status reviewed.  Objective:   BP 112/64   Pulse (!) 104   Temp 98.1 F (36.7 C) (Oral)   Wt 180 lb (81.6 kg)   SpO2 97%   BMI 29.05 kg/m  Vitals and nursing note reviewed.  General: well nourished, well developed, in no acute distress with non-toxic appearance Skin: warm, dry.  Diffuse erythematous papular rash present on bilateral feet and lower legs with few isolated lesions on bilateral arms, trunk and back.  Healing lesions noted to right neck.  Few lesions with excoriated scabs overlying.  One vesicular-like lesion on her right ankle.  No lesions noted in the folds of her hands fingers or feet.  (See pictures below) Extremities: warm and well perfused, normal tone MSK: ROM grossly intact, strength intact, gait  normal Neuro: Alert and oriented, speech normal       Assessment & Plan:   Rash and nonspecific skin eruption Erythematous, itchy papular skin eruption noted to bilateral lower legs, arms with few isolated lesions on abdomen and back.  Exam most consistent with insect bite as previously noted, however history less likely contributory to this etiology and not all lesions on exposed skin.  Does not appear to have superimposed bacterial infection and as it did not get worse with steroid treatment, less likely due to infection.  Will prescribe oral steroid burst to help with itching and eruption with strict return precautions. If does not improve with oral steroids and no other identifying source, may consider dermatology clinic referral for possible biopsy.  Patient amenable to plan.  No orders of the defined types were placed in this encounter.  Meds ordered this encounter  Medications  . predniSONE (DELTASONE) 20 MG tablet    Sig: Take 2 tablets (40 mg total) by mouth daily with breakfast for 5 days.    Dispense:  10 tablet    Refill:  0   Precepted with Dr. Jennette KettleNeal.  Ellwood DenseAlison Nichollas Perusse, DO PGY-2, [facility] 06/24/2018 11:57 AM

## 2018-06-23 NOTE — Patient Instructions (Signed)
It was great to see you!  Our plans for today:  - We will try an oral steroid to help clear the rash and help with itching. - If it does not get better with the steroids, come back to the clinic to be reevaluated.  Take care and seek immediate care sooner if you develop any concerns.   Dr. Mollie Germanyumball [facility]

## 2018-06-24 ENCOUNTER — Encounter: Payer: Self-pay | Admitting: Family Medicine

## 2018-06-24 DIAGNOSIS — R21 Rash and other nonspecific skin eruption: Secondary | ICD-10-CM | POA: Insufficient documentation

## 2018-06-24 NOTE — Progress Notes (Signed)
Looks like insect bite hypersensitivity reaction. Please look up image and management and let me know if you agree when you compare images. You can also refer patient to the derm clinic for reassessment.

## 2018-06-24 NOTE — Progress Notes (Signed)
Could be indoor insect bite such as bedbugs. Need to do home inspection at some point.

## 2018-06-24 NOTE — Assessment & Plan Note (Addendum)
Erythematous, itchy papular skin eruption noted to bilateral lower legs, arms with few isolated lesions on abdomen and back.  Exam most consistent with insect bite as previously noted, however history less likely contributory to this etiology and not all lesions on exposed skin.  Does not appear to have superimposed bacterial infection and as it did not get worse with steroid treatment, less likely due to infection.  Will prescribe oral steroid burst to help with itching and eruption with strict return precautions. If does not improve with oral steroids and no other identifying source, may consider dermatology clinic referral for possible biopsy.  Patient amenable to plan.

## 2018-07-03 ENCOUNTER — Ambulatory Visit: Payer: BLUE CROSS/BLUE SHIELD | Admitting: Family Medicine

## 2018-07-03 ENCOUNTER — Other Ambulatory Visit: Payer: Self-pay

## 2018-07-03 VITALS — BP 104/66 | HR 80 | Temp 98.3°F | Wt 179.4 lb

## 2018-07-03 DIAGNOSIS — W57XXXD Bitten or stung by nonvenomous insect and other nonvenomous arthropods, subsequent encounter: Secondary | ICD-10-CM

## 2018-07-03 DIAGNOSIS — G44209 Tension-type headache, unspecified, not intractable: Secondary | ICD-10-CM | POA: Diagnosis not present

## 2018-07-03 DIAGNOSIS — W57XXXA Bitten or stung by nonvenomous insect and other nonvenomous arthropods, initial encounter: Secondary | ICD-10-CM

## 2018-07-03 DIAGNOSIS — M25511 Pain in right shoulder: Secondary | ICD-10-CM | POA: Insufficient documentation

## 2018-07-03 DIAGNOSIS — M25512 Pain in left shoulder: Secondary | ICD-10-CM | POA: Diagnosis not present

## 2018-07-03 DIAGNOSIS — S80869A Insect bite (nonvenomous), unspecified lower leg, initial encounter: Secondary | ICD-10-CM | POA: Insufficient documentation

## 2018-07-03 DIAGNOSIS — S80869D Insect bite (nonvenomous), unspecified lower leg, subsequent encounter: Secondary | ICD-10-CM | POA: Diagnosis not present

## 2018-07-03 NOTE — Assessment & Plan Note (Signed)
Acute.  Likely secondary to increased stress and shoulder tension.  No meningeal signs and neuro exam intact. - Reviewed return precautions - Advised patient to take Tylenol 650 mg and to follow-up with neurologist PCP for chronic migraines

## 2018-07-03 NOTE — Patient Instructions (Signed)
Thank you for coming in to see us today. Please see below to review our plan for today's visit.  1.  Your shoulder pain and headache likely related to tension which is commonly caused by stress.  Decreasing her stress level is the best way to handle this but can be difficult.  You can use Tylenol, to 650 mg every 6 hours as needed for pain relief.  Ibuprofen 800 mg every 8 hours as an option though given your history of migraines, would prefer Tylenol to avoid rebound headache.  Follow-up with your neurologist. 2.  Your rash on your ankles appears to be related to flea bites.  Be sure to de-flea your cats at home and you may want to consider having an exterminator remove fleas from the carpets at home.  Please call the clinic at [phone] if your symptoms worsen or you have any concerns. It was our pleasure to serve you.  Durward Parcelavid McMullen, DO [facility], PGY-3

## 2018-07-03 NOTE — Assessment & Plan Note (Signed)
Chronic.  Likely secondary to cat.  Rash could be related to bedbug though less consistent based on distribution.  No purpura based on exam. - Advised patient to have pets treated and house for fleas

## 2018-07-03 NOTE — Assessment & Plan Note (Signed)
Acute.  Likely related to increased stress from possibility of moving to [place].  Distribution appears to be localized to bilateral trapezius and occiput. - Discussed ways to decrease stress and advised to take Tylenol as needed for pain - Reviewed return precautions

## 2018-07-03 NOTE — Progress Notes (Signed)
   Subjective   Patient ID: Susan Hopkins    DOB: Feb 11, 1971, 47 y.o. female   MRN: [id number]  CC: "Shoulder pain"  HPI: Susan Hopkins is a 47 y.o. female who presents to the clinic for the following:  Bilateral shoulder pain and headache: Onset 3 days ago with bilateral shoulder involvement radiating up to posterior neck and occiput.  Patient also endorses an episode of diaphoresis and feelings of malaise.  She is up-to-date on her vaccinations.  She does have known migraines followed by neurologist and has not had any medications for this current headache.  She denies any weight loss, new rash, fevers or chills, focal weakness, cough, rhinorrhea, sneeze.  She feels that her pain may be related to increased stress this last week due to the possibility of returning to her home country of [place].  Bug bites: Patient recently seen on 06/23/2018 for similar rash on bilateral feet and ankles.  Patient states she has a couple of lesions on her arms.  She was prescribed steroids with some improvement of symptoms of itching.  She does endorse having cats at the house.  ROS: see HPI for pertinent.  PMFSH: DM, HTN, anemia, chronic headache, migraines, GERD, vitamin D deficiency, stress incontinence, overweight.  Surgical history tonsillectomy, C-section x3, cholecystectomy, D&C, female circumcision.  Family history DM, HTN, HLD, cancer (father, prostate).  Smoking status reviewed. Medications reviewed.  Objective   BP 104/66   Pulse 80   Temp 98.3 F (36.8 C) (Oral)   Wt 179 lb 6.4 oz (81.4 kg)   SpO2 98%   BMI 28.96 kg/m  Vitals and nursing note reviewed.  General: well nourished, well developed, NAD with non-toxic appearance HEENT: normocephalic, atraumatic, moist mucous membranes, PERRLA, EOMI Neck: supple, non-tender without lymphadenopathy, able to bring chin to chest Cardiovascular: regular rate and rhythm without murmurs, rubs, or gallops Lungs: clear to auscultation bilaterally with  normal work of breathing Skin: warm, dry, cap refill < 2 seconds, multiple small papules with scabs with signs of excoriation on bilateral ankles and occasional papule on forearms Extremities: warm and well perfused, normal tone, no edema Neuro: CNII-XII intact, no dysarthria or facial droop, fine motor intact  Assessment & Plan   Flea bite of lower leg Chronic.  Likely secondary to cat.  Rash could be related to bedbug though less consistent based on distribution.  No purpura based on exam. - Advised patient to have pets treated and house for fleas  Tension headache Acute.  Likely secondary to increased stress and shoulder tension.  No meningeal signs and neuro exam intact. - Reviewed return precautions - Advised patient to take Tylenol 650 mg and to follow-up with neurologist PCP for chronic migraines  Acute pain of both shoulders Acute.  Likely related to increased stress from possibility of moving to [place].  Distribution appears to be localized to bilateral trapezius and occiput. - Discussed ways to decrease stress and advised to take Tylenol as needed for pain - Reviewed return precautions  No orders of the defined types were placed in this encounter.  No orders of the defined types were placed in this encounter.   Durward Parcelavid Ketih Goodie, DO [facility], PGY-3 07/03/2018, 12:23 PM

## 2018-07-17 ENCOUNTER — Other Ambulatory Visit: Payer: Self-pay | Admitting: Nurse Practitioner

## 2018-07-17 ENCOUNTER — Other Ambulatory Visit: Payer: Self-pay | Admitting: Family Medicine

## 2018-07-28 ENCOUNTER — Ambulatory Visit: Payer: BLUE CROSS/BLUE SHIELD | Admitting: Neurology

## 2018-07-28 ENCOUNTER — Encounter: Payer: Self-pay | Admitting: Neurology

## 2018-07-28 VITALS — BP 116/82 | HR 83 | Ht 66.0 in | Wt 182.0 lb

## 2018-07-28 DIAGNOSIS — G43009 Migraine without aura, not intractable, without status migrainosus: Secondary | ICD-10-CM | POA: Diagnosis not present

## 2018-07-28 DIAGNOSIS — M7918 Myalgia, other site: Secondary | ICD-10-CM

## 2018-07-28 MED ORDER — TRAMADOL HCL 50 MG PO TABS: ORAL_TABLET | ORAL | 5 refills | Status: DC

## 2018-07-28 MED ORDER — TIZANIDINE HCL 4 MG PO TABS: 4.0000 mg | ORAL_TABLET | Freq: Four times a day (QID) | ORAL | 11 refills | Status: DC | PRN

## 2018-07-28 MED ORDER — DICLOFENAC POTASSIUM 50 MG PO TABS: ORAL_TABLET | ORAL | 11 refills | Status: DC

## 2018-07-28 NOTE — Progress Notes (Signed)
[facility]    Provider:  Dr Lucia Gaskins Referring Provider: Tillman Sers, DO Primary Care Physician:  Tillman Sers, DO CC: Migraine  Interval history 07/28/2018: She has not started the Aimovig yet, she has it at home. She can stop the Nortriptyline. She is having side effects to the Nortriptyline. Recommend decreasing Nortriptyline to 10mg  and then posisble stopping a month after Aimovig. She is not using the OTC medications. She is still on propranolol. Failed Topamax. She has 4 migraines a month, she takes diclofenac and zofran, can last days untreated. Doing well otherwise.   Interval history 01/26/2018: Migraines have worsened. Started Ajovy. Migraines are more frequent, she is using tylenol, ibuprofen, tramadol and diclofenac every day. She is using excedrin and multiple other medications. Counseled her on Medication Overuse.  Had a long discussion, provided literature to read. Continue Ajovy.   Interval history 2018: She has migraines 4x a week. They can last 2 days. She stopped Nortriptyline and the headaches got worse. She has less than 10 headache free days, 10 migraine days a month. 4 days are severe migrainous and a few other days are migrainous but mild. Worse if she forgets the Nortriptyline.  Propranolol also helps esp with palpitations, she feels worse if she forgets. She is feeling very depressed, she cries in the office today. She feels lonely, she doesn't have any family here and it is hard to raise children here. She feels sad most days. She cries sometimes at home. No suicidal ideation. She has friends here, she has a close friend here. She has had these feelings for a long time. She has an appointment with her pcp next week.   Interval 05/20/2017:  Patient returns today for follow-up of migraines on nortriptyline and propranolol. Had side effects to Topamax. We were unable to increase her nortriptyline due to side effects. She takes Phenergan and diclofenac  for acute management. In one month she has 2-3 migraines and if she takes the diclofenac and phenergan during the aura she does not get the migraines and she sleeps well and she is fine. She is doing well on the Nortriptyline and propranolol. She has tiredness to the nortriptyline, slowly titrate off. Every week go down a pill. Will stay on the propranolol. Will try Cambia instead of diclofenac.  Interval history 12/19/2016:Patient is here for follow-up of chronic migraines. She is on nortriptyline and propranolol. Topamax was tried in the past the patient had side effects. Propranolol was increased at last appointment to 60 mg a day. We tried to increase the nortriptyline in the past but had joint pain. She had side effects to trip him. She takes Phenergan for headaches and diclofenac, I have discussed rebound headache and need to limit these. Relpax gave her chest pain and tightness. Amitriptyline made her too drowsy increased caffeine makes her headaches worse. Imitrex caused her neck and shoulder pain. Patient's headaches are described as throbbing, light sensitivity, sound sensitivity, neck and shoulder muscular pain, mostly on the right but can be on the left. She has been sleeping more and drinking caffeine which are triggers. She is fine today, no headache but has been suffering on and off for the last 3 weeks with migraines. They start after having coffee, would switch to decaf. And she has pain behind the eyes. She is having pain right now.   She was doing well for 4-5 months since last being seen. She started having a headache 3 weeks ago, she is having a lot  of aura (does not always have an aura). 3 weeks ago had migraines all weekend. She has increased her Nortriptyline to 3 pills at night and propranolol to 20mg  tid. Headaches for the last 5 days as well.   Medications tried: Nortriptyline(on low dose could not increase doe), Amitriptyline(too sleepy), propranolol(on ER currently), Topamax,  Relpax(chest pain and tightness), amitriptyline, Imitrex, Phenergan and diclofenac for acute management.  Interval history 05/25/2016: She is on nortriptyline and propranolol and this has helped with the migraines. She is still having chronic migraines 4-5 in the last 2 weeks but she has been fasting and thinks dehydration is affecting. Before this the headaches were better she was having them 1-2x a month. For 3 months she had no headaches at all. Really just an increase in the last few weeks likely due to fasting. She takes cambia. She was sensitive to imitrex and relpax. With the relpax her blood pressure went up and she had jaw pain. She went to the emergency room, tongue swelling. She tried to increase the Nortriptyline and had joint pain. She is on 20mg  mortriptyline. She had side effects to trokendi. She takes phenergan for the headaches. She takes diclofenac 4-5x a week no more (can cause rebound headache)  Interval history 11/28/2015; She has worsening headaches. She is crying today, in the last 3 weeks over 8 migraines. Relpax gave her side effects, chest pain and tightness. She started Amitriptyline, she didn't have a headache for a few months. She was having insomnia and amitrip helped a lot but made her too drowy. Sleep and increased caffeine makes her headaches worse. Yesterday she had a very bad migraine. She has had more than 8 migraines in the last 3 weeks.   HPI: Susan Hopkins is a 47 y.o. female here as a referral from Dr. Jimmey Ralph for Migraine  Migraines started 7 years ago. Used to have it 1-2x a year. Was worse with pregnancy. This last month she has pain on the right side of the head continuously. Throbbing. endorses Light sensitivity, sound sensitivity. Has wavy light aura. She has them 2x a month. This month she has had it a at a low level all month, always there. She takes tylenol, ibuprofen daily. She has neck and shoulder muscular pain. Imitrex doesn't work, makes her shoulder  sore, hands hurt. She has nausea and vomiting with the headaches. She went back to school this month, that may be exacerbating the headaches. She has a new glasses prescription. She has 4 kids. No more kids, has an IUD. She gets to sleep but everything wakes her up. She can't nap during the day. No snoring. Migraine can be severe. Can be 10/10. 8-10 hours.      Social History   Socioeconomic History  . Marital status: Married    Spouse name: [name]  . Number of children: 4  . Years of education: Ba  . Highest education level: Not on file  Occupational History  . Occupation: [employer]  Social Needs  . Financial resource strain: Not on file  . Food insecurity:    Worry: Not on file    Inability: Not on file  . Transportation needs:    Medical: Not on file    Non-medical: Not on file  Tobacco Use  . Smoking status: Never Smoker  . Smokeless tobacco: Never Used  Substance and Sexual Activity  . Alcohol use: No  . Drug use: No  . Sexual activity: Yes    Birth control/protection: IUD  Lifestyle  .  Physical activity:    Days per week: Not on file    Minutes per session: Not on file  . Stress: Not on file  Relationships  . Social connections:    Talks on phone: Not on file    Gets together: Not on file    Attends religious service: Not on file    Active member of club or organization: Not on file    Attends meetings of clubs or organizations: Not on file    Relationship status: Not on file  . Intimate partner violence:    Fear of current or ex partner: Not on file    Emotionally abused: Not on file    Physically abused: Not on file    Forced sexual activity: Not on file  Other Topics Concern  . Not on file  Social History Narrative   Lives at home with husband and 4 kids.   Right handed.    Caffeine use: Coffee (1-2 cups/day)    Family History  Problem Relation Age of Onset  . Diabetes Mother   . Hypertension Mother   . Hyperlipidemia Mother   . Hypertension Father     . Hyperlipidemia Father   . Prostate cancer Father   . Diabetes Father   . Anesthesia problems Neg Hx   . Hypotension Neg Hx   . Malignant hyperthermia Neg Hx   . Pseudochol deficiency Neg Hx     Past Medical History:  Diagnosis Date  . ADVANCED MATERNAL AGE 66/02/2008  . ANEMIA, IRON DEFICIENCY, UNSPEC. 02/12/2007   after IUD  . Constipation   . Contact dermatitis 08/27/2011  . Diabetes mellitus without complication (HCC)    gestational diab. currently & with infant #2  . DIABETES MELLITUS, GESTATIONAL, HX OF 05/18/2008  . Diabetes mellitus, type II (HCC)    Hbg A1C 6.9 after pregancy, now on Metformin  . Diarrhea   . 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 Gastric ulcer    history of  . GERD (gastroesophageal reflux disease)    doesn't take anything for acid reflux  . Gestational diabetes A2/B 07/20/2012   Fetal echo-nml-under media tab, needs baseline labs-nml with nml TSH and 24 hour urine-68mg . To see optho in Nov 2013-Per pt. Ok, serial u/s for growth, 2x/wk testing at 32 wks.   . Hemorrhoids   . 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 IUD complication (HCC) 07/28/2015  . Migraine headache    last migraine in Sept 2012    Past Surgical History:  Procedure Laterality Date  . CESAREAN SECTION N/A 01/22/2013   Procedure: CESAREAN SECTION;  Surgeon: Reva Boresanya S Pratt, MD;  Location: [facility];  Service: Obstetrics;  Laterality: N/A;  Repeat  . CESAREAN SECTION  2005  . CESAREAN SECTION  2010  . CHOLECYSTECTOMY  11/28/2011   Procedure: LAPAROSCOPIC CHOLECYSTECTOMY WITH INTRAOPERATIVE CHOLANGIOGRAM;  Surgeon: Wilmon ArmsMatthew K. Corliss Skainssuei, MD;  Location: [facility] OR;  Service: General;  Laterality: N/A;  . DILATION AND CURETTAGE OF UTERUS  2009  . female circumcision  [id number]  . TONSILLECTOMY  1989    Current Outpatient Medications  Medication Sig Dispense Refill  . diclofenac (CATAFLAM) 50 MG tablet TAKE ONE TABLET BY MOUTH ONCE DAILY AS NEEDED FOR ONSET  OF HEADACHE. TAKE WITH FOOD 30 tablet 11  . hydrochlorothiazide (HYDRODIURIL) 25 MG tablet Take 0.5 tablets (12.5 mg total) by mouth daily. 45 tablet 6  . metFORMIN (GLUCOPHAGE) 500 MG tablet Take 1 tablet (500 mg total) by  mouth daily with breakfast. 90 tablet 1  . nortriptyline (PAMELOR) 10 MG capsule TAKE 1 CAPSULE BY MOUTH AT BEDTIME FOR 1 WEEK THEN INCREASE BY 1 CAPSULE WEEKLY UNTIL TAKING 3 CAPSULES AT NIGHT. 90 capsule 0  . ondansetron (ZOFRAN) 4 MG tablet Take 1 tablet (4 mg total) by mouth every 8 (eight) hours as needed for nausea or vomiting. 20 tablet 3  . propranolol ER (INDERAL LA) 60 MG 24 hr capsule Take 1 capsule (60 mg total) by mouth at bedtime. 30 capsule 12  . Vitamin D, Ergocalciferol, (DRISDOL) 50000 units CAPS capsule TAKE 1 CAPSULE BY MOUTH ONCE A WEEK. FOR 8 WEEKS 8 capsule 0  . Erenumab-aooe (AIMOVIG) 140 MG/ML SOAJ Inject 140 mg into the skin every 30 (thirty) days. This will replace Ajovy. Do not take Ajovy anymore. (Patient not taking: Reported on 07/28/2018) 1 pen 11  . fluticasone (FLONASE) 50 MCG/ACT nasal spray Place 2 sprays into both nostrils daily. 16 g 3  . tiZANidine (ZANAFLEX) 4 MG tablet Take 1 tablet (4 mg total) by mouth every 6 (six) hours as needed for muscle spasms. 30 tablet 11  . traMADol (ULTRAM) 50 MG tablet Take 1-2 tablets every 6 hours for pain 30 tablet 5   No current facility-administered medications for this visit.     Allergies as of 07/28/2018 - Review Complete 07/28/2018  Allergen Reaction Noted  . Relpax [eletriptan] Shortness Of Breath 11/14/2015  . Oxycodone Itching 12/26/2011  . Phenergan [promethazine hcl]  04/06/2018    Vitals: BP 116/82 (BP Location: Right Arm, Patient Position: Sitting)   Pulse 83   Ht 5\' 6"  (1.676 m)   Wt 182 lb (82.6 kg)   BMI 29.38 kg/m  Last Weight:  Wt Readings from Last 1 Encounters:  07/28/18 182 lb (82.6 kg)   Last Height:   Ht Readings from Last 1 Encounters:  07/28/18 5\' 6"  (1.676 m)    Physical exam: Exam: Gen: NAD, conversant, well nourised, obese, well groomed                     CV: RRR, no MRG. No Carotid Bruits. No peripheral edema, warm, nontender Eyes: Conjunctivae clear without exudates or hemorrhage  Neuro: Detailed Neurologic Exam  Speech:    Speech is normal; fluent and spontaneous with normal comprehension.  Cognition:    The patient is oriented to person, place, and time;     recent and remote memory intact;     language fluent;     normal attention, concentration,     fund of knowledge Cranial Nerves:    The pupils are equal, round, and reactive to light. The fundi are normal and spontaneous venous pulsations are present. Visual fields are full to finger confrontation. Extraocular movements are intact. Trigeminal sensation is intact and the muscles of mastication are normal. The face is symmetric. The palate elevates in the midline. Hearing intact. Voice is normal. Shoulder shrug is normal. The tongue has normal motion without fasciculations.   Coordination:    Normal finger to nose and heel to shin. Normal rapid alternating movements.   Gait:    Heel-toe and tandem gait are normal.   Motor Observation:    No asymmetry, no atrophy, and no involuntary movements noted. Tone:    Normal muscle tone.    Posture:    Posture is normal. normal erect    Strength:    Strength is V/V in the upper and lower limbs.  Sensation: intact to LT     Reflex Exam:  DTR's:    Deep tendon reflexes in the upper and lower extremities are normal bilaterally.   Toes:    The toes are downgoing bilaterally.   Clonus:    Clonus is absent.   Assessment/Plan: 47 year old female with migraines. She is on propranolol and nortriptyline.  diclofenac pills for acute management, Phenergan/zofran for nausea she cannot tolerate triptan's.  Physical Therapy: Cervical myofascial pain, forward posture contributing to migraines and cervicalgia. Please evaluate and  treat including dry needling, stretching, strengthening, manual therapy/massage, heating, TENS unit, exercising for scapular stabilization, pectoral stretching and rhomboid strengthening as clinically warranted as well as any other modality as recommended by evaluation.  Also tizanidine for muscle pain/spasms/neck tightness MRI of the brain was normal Diclofenac and zofran for acute management, phenergan for nausea (had tongue swelling with triptans). Tramadol sparingly. Continue propranol to 60mg  at night ER Decrease nortriptyline to 10mg  and start Aimovig, may stop nortriptyline in a month Discussed botox for migraine, decided on CGRP antagonists Other medications we can try are Depakote or Cymbalta Discussed risk of seratonin syndrome, she understands risks, both medications are low dose (nortriptyline, Prozac, tramadol,tizanidine) Prozac for depression,  she stopped it herself. Recommended counselor, she is following up soon with pcp for this as well, she feels stable.  Stop tramadol, discussed with patient  Discussed: To prevent or relieve headaches, try the following: Cool Compress. Lie down and place a cool compress on your head.  Avoid headache triggers. If certain foods or odors seem to have triggered your migraines in the past, avoid them. A headache diary might help you identify triggers.  Include physical activity in your daily routine. Try a daily walk or other moderate aerobic exercise.  Manage stress. Find healthy ways to cope with the stressors, such as delegating tasks on your to-do list.  Practice relaxation techniques. Try deep breathing, yoga, massage and visualization.  Eat regularly. Eating regularly scheduled meals and maintaining a healthy diet might help prevent headaches. Also, drink plenty of fluids.  Follow a regular sleep schedule. Sleep deprivation might contribute to headaches Consider biofeedback. With this mind-body technique, you learn to control certain bodily  functions - such as muscle tension, heart rate and blood pressure - to prevent headaches or reduce headache pain.    Proceed to emergency room if you experience new or worsening symptoms or symptoms do not resolve, if you have new neurologic symptoms or if headache is severe, or for any concerning symptom.   Provided education and documentation from American headache Society toolbox including articles on: chronic migraine medication overuse headache, chronic migraines, prevention of migraines, behavioral and other nonpharmacologic treatments for headache.   Susan DeanAntonia Koehn Salehi, MD  [facility] [address] [place] [id number]  Phone [phone] Fax [phone]  A total of 25 minutes was spent face-to-face with this patient. Over half this time was spent on counseling patient on the  1. Migraine without aura and without status migrainosus, not intractable   2. Cervical myofascial pain syndrome     depression diagnosis and different diagnostic and therapeutic options available.

## 2018-07-28 NOTE — Patient Instructions (Addendum)
Decrease Nortriptyline to 10mg  at night Start Aimovig, inject once monthly In one month can stop Nortriptyline Tizanidine for muscle aches and neck pain/tightness Tramadol as needed for headache Physical therapy   Tramadol tablets What is this medicine? TRAMADOL (TRA ma dole) is a pain reliever. It is used to treat moderate to severe pain in adults. This medicine may be used for other purposes; ask your health care provider or pharmacist if you have questions. COMMON BRAND NAME(S): Ultram What should I tell my health care provider before I take this medicine? They need to know if you have any of these conditions: -brain tumor -depression -drug abuse or addiction -head injury -if you frequently drink alcohol containing drinks -kidney disease or trouble passing urine -liver disease -lung disease, asthma, or breathing problems -seizures or epilepsy -suicidal thoughts, plans, or attempt; a previous suicide attempt by you or a family member -an unusual or allergic reaction to tramadol, codeine, other medicines, foods, dyes, or preservatives -pregnant or trying to get pregnant -breast-feeding How should I use this medicine? Take this medicine by mouth with a full glass of water. Follow the directions on the prescription label. You can take it with or without food. If it upsets your stomach, take it with food. Do not take your medicine more often than directed. A special MedGuide will be given to you by the pharmacist with each prescription and refill. Be sure to read this information carefully each time. Talk to your pediatrician regarding the use of this medicine in children. Special care may be needed. Overdosage: If you think you have taken too much of this medicine contact a poison control center or emergency room at once. NOTE: This medicine is only for you. Do not share this medicine with others. What if I miss a dose? If you miss a dose, take it as soon as you can. If it is almost  time for your next dose, take only that dose. Do not take double or extra doses. What may interact with this medicine? Do not take this medication with any of the following medicines: -MAOIs like Carbex, Eldepryl, Marplan, Nardil, and Parnate This medicine may also interact with the following medications: -alcohol -antihistamines for allergy, cough and cold -certain medicines for anxiety or sleep -certain medicines for depression like amitriptyline, fluoxetine, sertraline -certain medicines for migraine headache like almotriptan, eletriptan, frovatriptan, naratriptan, rizatriptan, sumatriptan, zolmitriptan -certain medicines for seizures like carbamazepine, oxcarbazepine, phenobarbital, primidone -certain medicines that treat or prevent blood clots like warfarin -digoxin -furazolidone -general anesthetics like halothane, isoflurane, methoxyflurane, propofol -linezolid -local anesthetics like lidocaine, pramoxine, tetracaine -medicines that relax muscles for surgery -other narcotic medicines for pain or cough -phenothiazines like chlorpromazine, mesoridazine, prochlorperazine, thioridazine -procarbazine This list may not describe all possible interactions. Give your health care provider a list of all the medicines, herbs, non-prescription drugs, or dietary supplements you use. Also tell them if you smoke, drink alcohol, or use illegal drugs. Some items may interact with your medicine. What should I watch for while using this medicine? Tell your doctor or health care professional if your pain does not go away, if it gets worse, or if you have new or a different type of pain. You may develop tolerance to the medicine. Tolerance means that you will need a higher dose of the medicine for pain relief. Tolerance is normal and is expected if you take this medicine for a long time. Do not suddenly stop taking your medicine because you may develop a severe reaction. Your  body becomes used to the  medicine. This does NOT mean you are addicted. Addiction is a behavior related to getting and using a drug for a non-medical reason. If you have pain, you have a medical reason to take pain medicine. Your doctor will tell you how much medicine to take. If your doctor wants you to stop the medicine, the dose will be slowly lowered over time to avoid any side effects. There are different types of narcotic medicines (opiates). If you take more than one type at the same time or if you are taking another medicine that also causes drowsiness, you may have more side effects. Give your health care provider a list of all medicines you use. Your doctor will tell you how much medicine to take. Do not take more medicine than directed. Call emergency for help if you have problems breathing or unusual sleepiness. You may get drowsy or dizzy. Do not drive, use machinery, or do anything that needs mental alertness until you know how this medicine affects you. Do not stand or sit up quickly, especially if you are an older patient. This reduces the risk of dizzy or fainting spells. Alcohol can increase or decrease the effects of this medicine. Avoid alcoholic drinks. You may have constipation. Try to have a bowel movement at least every 2 to 3 days. If you do not have a bowel movement for 3 days, call your doctor or health care professional. Your mouth may get dry. Chewing sugarless gum or sucking hard candy, and drinking plenty of water may help. Contact your doctor if the problem does not go away or is severe. What side effects may I notice from receiving this medicine? Side effects that you should report to your doctor or health care professional as soon as possible: -allergic reactions like skin rash, itching or hives, swelling of the face, lips, or tongue -breathing problems -confusion -seizures -signs and symptoms of low blood pressure like dizziness; feeling faint or lightheaded, falls; unusually weak or  tired -trouble passing urine or change in the amount of urine Side effects that usually do not require medical attention (report to your doctor or health care professional if they continue or are bothersome): -constipation -dry mouth -nausea, vomiting -tiredness This list may not describe all possible side effects. Call your doctor for medical advice about side effects. You may report side effects to FDA at 1-800-FDA-1088. Where should I keep my medicine? Keep out of the reach of children. This medicine may cause accidental overdose and death if it taken by other adults, children, or pets. Mix any unused medicine with a substance like cat litter or coffee grounds. Then throw the medicine away in a sealed container like a sealed bag or a coffee can with a lid. Do not use the medicine after the expiration date. Store at room temperature between 15 and 30 degrees C (59 and 86 degrees F). NOTE: This sheet is a summary. It may not cover all possible information. If you have questions about this medicine, talk to your doctor, pharmacist, or health care provider.  2018 Elsevier/Gold Standard (2015-08-27 09:00:04)   Tizanidine tablets or capsules What is this medicine? TIZANIDINE (tye ZAN i deen) helps to relieve muscle spasms. It may be used to help in the treatment of multiple sclerosis and spinal cord injury. This medicine may be used for other purposes; ask your health care provider or pharmacist if you have questions. COMMON BRAND NAME(S): Zanaflex What should I tell my health care provider  before I take this medicine? They need to know if you have any of these conditions: -kidney disease -liver disease -low blood pressure -mental disorder -an unusual or allergic reaction to tizanidine, other medicines, lactose (tablets only), foods, dyes, or preservatives -pregnant or trying to get pregnant -breast-feeding How should I use this medicine? Take this medicine by mouth with a full glass of  water. Take this medicine on an empty stomach, at least 30 minutes before or 2 hours after food. Do not take with food unless you talk with your doctor. Follow the directions on the prescription label. Take your medicine at regular intervals. Do not take your medicine more often than directed. Do not stop taking except on your doctor's advice. Suddenly stopping the medicine can be very dangerous. Talk to your pediatrician regarding the use of this medicine in children. Patients over 77 years old may have a stronger reaction and need a smaller dose. Overdosage: If you think you have taken too much of this medicine contact a poison control center or emergency room at once. NOTE: This medicine is only for you. Do not share this medicine with others. What if I miss a dose? If you miss a dose, take it as soon as you can. If it is almost time for your next dose, take only that dose. Do not take double or extra doses. What may interact with this medicine? Do not take this medicine with any of the following medications: -ciprofloxacin -cisapride -dofetilide -dronedarone -fluvoxamine -narcotic medicines for cough -pimozide -thiabendazole -thioridazine -ziprasidone This medicine may also interact with the following medications: -acyclovir -alcohol -antihistamines for allergy, cough and cold -baclofen -certain antibiotics like levofloxacin, ofloxacin -certain medicines for anxiety or sleep -certain medicines for blood pressure, heart disease, irregular heart beat -certain medicines for depression like amitriptyline, fluoxetine, sertraline -certain medicines for seizures like phenobarbital, primidone -certain medicines for stomach problems like cimetidine, famotidine -female hormones, like estrogens or progestins and birth control pills, patches, rings, or injections -general anesthetics like halothane, isoflurane, methoxyflurane, propofol -local anesthetics like lidocaine, pramoxine,  tetracaine -medicines that relax muscles for surgery -narcotic medicines for pain -other medicines that prolong the QT interval (cause an abnormal heart rhythm) -phenothiazines like chlorpromazine, mesoridazine, prochlorperazine -ticlopidine -zileuton This list may not describe all possible interactions. Give your health care provider a list of all the medicines, herbs, non-prescription drugs, or dietary supplements you use. Also tell them if you smoke, drink alcohol, or use illegal drugs. Some items may interact with your medicine. What should I watch for while using this medicine? Tell your doctor or health care professional if your symptoms do not start to get better or if they get worse. You may get drowsy or dizzy. Do not drive, use machinery, or do anything that needs mental alertness until you know how this medicine affects you. Do not stand or sit up quickly, especially if you are an older patient. This reduces the risk of dizzy or fainting spells. Alcohol may interfere with the effect of this medicine. Avoid alcoholic drinks. If you are taking another medicine that also causes drowsiness, you may have more side effects. Give your health care provider a list of all medicines you use. Your doctor will tell you how much medicine to take. Do not take more medicine than directed. Call emergency for help if you have problems breathing or unusual sleepiness. Your mouth may get dry. Chewing sugarless gum or sucking hard candy, and drinking plenty of water may help. Contact your doctor if  the problem does not go away or is severe. What side effects may I notice from receiving this medicine? Side effects that you should report to your doctor or health care professional as soon as possible: -allergic reactions like skin rash, itching or hives, swelling of the face, lips, or tongue -breathing problems -hallucinations -signs and symptoms of liver injury like dark yellow or brown urine; general ill  feeling or flu-like symptoms; light-colored stools; loss of appetite; nausea; right upper quadrant belly pain; unusually weak or tired; yellowing of the eyes or skin -signs and symptoms of low blood pressure like dizziness; feeling faint or lightheaded, falls; unusually weak or tired -unusually slow heartbeat -unusually weak or tired Side effects that usually do not require medical attention (report to your doctor or health care professional if they continue or are bothersome): -blurred vision -constipation -dizziness -dry mouth -tiredness This list may not describe all possible side effects. Call your doctor for medical advice about side effects. You may report side effects to FDA at 1-800-FDA-1088. Where should I keep my medicine? Keep out of the reach of children. Store at room temperature between 15 and 30 degrees C (59 and 86 degrees F). Throw away any unused medicine after the expiration date. NOTE: This sheet is a summary. It may not cover all possible information. If you have questions about this medicine, talk to your doctor, pharmacist, or health care provider.  2018 Elsevier/Gold Standard (2015-09-12 13:52:12)

## 2018-08-04 ENCOUNTER — Ambulatory Visit: Payer: BLUE CROSS/BLUE SHIELD | Admitting: Physical Therapy

## 2018-08-05 ENCOUNTER — Ambulatory Visit: Payer: Self-pay | Admitting: Family Medicine

## 2018-08-11 ENCOUNTER — Encounter: Payer: Self-pay | Admitting: *Deleted

## 2018-08-20 ENCOUNTER — Other Ambulatory Visit: Payer: Self-pay

## 2018-08-20 ENCOUNTER — Encounter (HOSPITAL_BASED_OUTPATIENT_CLINIC_OR_DEPARTMENT_OTHER): Payer: Self-pay | Admitting: *Deleted

## 2018-08-20 ENCOUNTER — Telehealth: Payer: Self-pay | Admitting: *Deleted

## 2018-08-20 DIAGNOSIS — Z7984 Long term (current) use of oral hypoglycemic drugs: Secondary | ICD-10-CM | POA: Diagnosis not present

## 2018-08-20 DIAGNOSIS — R51 Headache: Secondary | ICD-10-CM | POA: Diagnosis present

## 2018-08-20 DIAGNOSIS — E119 Type 2 diabetes mellitus without complications: Secondary | ICD-10-CM | POA: Insufficient documentation

## 2018-08-20 DIAGNOSIS — G43009 Migraine without aura, not intractable, without status migrainosus: Secondary | ICD-10-CM | POA: Insufficient documentation

## 2018-08-20 DIAGNOSIS — Z79899 Other long term (current) drug therapy: Secondary | ICD-10-CM | POA: Diagnosis not present

## 2018-08-20 NOTE — Telephone Encounter (Addendum)
Called pt and she stated that the Aimovig is working for her. She has less headaches and migraines with the Aimovig. She has only had to use the Diclofenac twice. She is requiring less acute meds for migraine. She is off the nortriptyline now. She is still taking Propranolol. She is happy with Aimovig. Patient aware that another PA will be sent to her insurance company. She verbalized appreciation.

## 2018-08-20 NOTE — ED Triage Notes (Signed)
Headache x 5 days but worse since this afternoon. Hx of migraines.

## 2018-08-20 NOTE — Telephone Encounter (Signed)
Completed Aimovig 140 mg PA on Cover My Meds for continued coverage. KEY: [id number]. See pt message encounter for conversation that RN had with patient today for supporting information. Currently awaiting determination from [facility].   "If [employer] has not responded in 3 business days or if you have any questions about your submission, contact [employer] at [phone]"

## 2018-08-21 ENCOUNTER — Emergency Department (HOSPITAL_BASED_OUTPATIENT_CLINIC_OR_DEPARTMENT_OTHER)
Admission: EM | Admit: 2018-08-21 | Discharge: 2018-08-21 | Disposition: A | Discharge status: Home / Self Care | Payer: BLUE CROSS/BLUE SHIELD | Attending: Emergency Medicine | Admitting: Emergency Medicine

## 2018-08-21 DIAGNOSIS — G43009 Migraine without aura, not intractable, without status migrainosus: Secondary | ICD-10-CM

## 2018-08-21 MED ORDER — SODIUM CHLORIDE 0.9 % IV BOLUS
1000.0000 mL | Freq: Once | INTRAVENOUS | Status: AC
  Administered 2018-08-21: 1000 mL via INTRAVENOUS

## 2018-08-21 MED ORDER — DIPHENHYDRAMINE HCL 50 MG/ML IJ SOLN
25.0000 mg | Freq: Once | INTRAMUSCULAR | Status: AC
  Administered 2018-08-21: 25 mg via INTRAVENOUS
  Filled 2018-08-21: qty 1

## 2018-08-21 MED ORDER — METOCLOPRAMIDE HCL 5 MG/ML IJ SOLN
10.0000 mg | Freq: Once | INTRAMUSCULAR | Status: AC
  Administered 2018-08-21: 10 mg via INTRAVENOUS
  Filled 2018-08-21: qty 2

## 2018-08-21 MED ORDER — PROCHLORPERAZINE EDISYLATE 10 MG/2ML IJ SOLN
10.0000 mg | Freq: Once | INTRAMUSCULAR | Status: AC
  Administered 2018-08-21: 10 mg via INTRAVENOUS
  Filled 2018-08-21: qty 2

## 2018-08-21 MED ORDER — KETOROLAC TROMETHAMINE 30 MG/ML IJ SOLN
30.0000 mg | Freq: Once | INTRAMUSCULAR | Status: AC
  Administered 2018-08-21: 30 mg via INTRAVENOUS
  Filled 2018-08-21: qty 1

## 2018-08-21 NOTE — ED Notes (Signed)
Pt. Reports she was too late getting her regular meds in when the headache started and now she can't get the migraine under control.  Pt. Reports headache on the L side with nausea and has vomited at home.

## 2018-08-21 NOTE — ED Provider Notes (Signed)
[facility] EMERGENCY DEPARTMENT Provider Note   CSN: [id number] Arrival date & time: 08/20/18  2220     History   Chief Complaint Chief Complaint  Patient presents with  . Headache    HPI Susan Hopkins is a 47 y.o. female.  The history is provided by the patient.  She has history of migraine headaches, gestational diabetes and comes in with a migraine headache.  She had an aura of seeing zigzag lines this morning.  Normally, she takes diclofenac when she gets the aura, but she did not have the medication with her.  The headache got established and she has not been able to break it.  Headache is left sided and throbbing she rates pain at 10/10.  There is associated photophobia and phonophobia.  There is associated nausea and vomiting.  Nothing makes the pain any better.  Pain is typical of her migraine headaches.  Past Medical History:  Diagnosis Date  . ADVANCED MATERNAL AGE 04/17/2008  . ANEMIA, IRON DEFICIENCY, UNSPEC. 02/12/2007   after IUD  . Constipation   . Contact dermatitis 08/27/2011  . Diabetes mellitus without complication (HCC)    gestational diab. currently & with infant #2  . DIABETES MELLITUS, GESTATIONAL, HX OF 05/18/2008  . Diabetes mellitus, type II (HCC)    Hbg A1C 6.9 after pregancy, now on Metformin  . Diarrhea   . 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 Gastric ulcer    history of  . GERD (gastroesophageal reflux disease)    doesn't take anything for acid reflux  . Gestational diabetes A2/B 07/20/2012   Fetal echo-nml-under media tab, needs baseline labs-nml with nml TSH and 24 hour urine-68mg . To see optho in Nov 2013-Per pt. Ok, serial u/s for growth, 2x/wk testing at 32 wks.   . Hemorrhoids   . 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 IUD complication (HCC) 07/28/2015  . Migraine headache    last migraine in Sept 2012    Patient Active Problem List   Diagnosis Date Noted  . Acute pain of both  shoulders 07/03/2018  . Tension headache 07/03/2018  . Flea bite of lower leg 07/03/2018  . Rash and nonspecific skin eruption 06/24/2018  . Bite, insect 06/15/2018  . Dry skin 01/14/2018  . Stress incontinence 09/03/2017  . Type 2 diabetes mellitus without complication, without long-term current use of insulin (HCC) 09/03/2017  . Seasonal allergic rhinitis 09/03/2017  . Perimenopausal 09/03/2017  . Vaginal discharge 08/10/2016  . Nasal pain 05/08/2016  . Fatigue 05/07/2016  . Vitamin D deficiency 01/05/2016  . Essential hypertension, benign 12/22/2015  . Venous (peripheral) insufficiency 11/05/2015  . Daily headache 03/09/2015  . Elevated liver enzymes 08/26/2012  . Mammographic breast lesion 07/30/2012  . Overweight (BMI 25.0-29.9) 10/15/2011  . Migraine 01/09/2011  . ANEMIA, IRON DEFICIENCY, UNSPEC. 02/12/2007  . GASTROESOPHAGEAL REFLUX, NO ESOPHAGITIS 02/12/2007    Past Surgical History:  Procedure Laterality Date  . CESAREAN SECTION N/A 01/22/2013   Procedure: CESAREAN SECTION;  Surgeon: Reva Bores, MD;  Location: [facility];  Service: Obstetrics;  Laterality: N/A;  Repeat  . CESAREAN SECTION  2005  . CESAREAN SECTION  2010  . CHOLECYSTECTOMY  11/28/2011   Procedure: LAPAROSCOPIC CHOLECYSTECTOMY WITH INTRAOPERATIVE CHOLANGIOGRAM;  Surgeon: Wilmon Arms. Corliss Skains, MD;  Location: [facility] OR;  Service: General;  Laterality: N/A;  . DILATION AND CURETTAGE OF UTERUS  2009  . female circumcision  87  . TONSILLECTOMY  1989  OB History    Gravida  5   Para  4   Term  4   Preterm      AB  1   Living  3     SAB  1   TAB      Ectopic      Multiple      Live Births  3            Home Medications    Prior to Admission medications   Medication Sig Start Date End Date Taking? Authorizing Provider  diclofenac (CATAFLAM) 50 MG tablet TAKE ONE TABLET BY MOUTH ONCE DAILY AS NEEDED FOR ONSET OF HEADACHE. TAKE WITH FOOD 07/28/18   Anson Fret, MD  Erenumab-aooe  (AIMOVIG) 140 MG/ML SOAJ Inject 140 mg into the skin every 30 (thirty) days. This will replace Ajovy. Do not take Ajovy anymore. Patient not taking: Reported on 07/28/2018 05/25/18   Anson Fret, MD  fluticasone [facility]) 50 MCG/ACT nasal spray Place 2 sprays into both nostrils daily. 09/03/17   Tillman Sers, DO  hydrochlorothiazide (HYDRODIURIL) 25 MG tablet Take 0.5 tablets (12.5 mg total) by mouth daily. 11/17/17   Tillman Sers, DO  metFORMIN (GLUCOPHAGE) 500 MG tablet Take 1 tablet (500 mg total) by mouth daily with breakfast. 09/03/17   Dolores Patty C, DO  nortriptyline (PAMELOR) 10 MG capsule TAKE 1 CAPSULE BY MOUTH AT BEDTIME FOR 1 WEEK THEN INCREASE BY 1 CAPSULE WEEKLY UNTIL TAKING 3 CAPSULES AT NIGHT. 07/17/18   Nilda Riggs, NP  ondansetron (ZOFRAN) 4 MG tablet Take 1 tablet (4 mg total) by mouth every 8 (eight) hours as needed for nausea or vomiting. 04/06/18   Nilda Riggs, NP  propranolol ER (INDERAL LA) 60 MG 24 hr capsule Take 1 capsule (60 mg total) by mouth at bedtime. 11/18/17   Anson Fret, MD  tiZANidine (ZANAFLEX) 4 MG tablet Take 1 tablet (4 mg total) by mouth every 6 (six) hours as needed for muscle spasms. 07/28/18   Anson Fret, MD  traMADol Janean Sark) 50 MG tablet Take 1-2 tablets every 6 hours for pain 07/28/18   Anson Fret, MD  Vitamin D, Ergocalciferol, (DRISDOL) 50000 units CAPS capsule TAKE 1 CAPSULE BY MOUTH ONCE A WEEK. FOR 8 WEEKS 07/17/18   Diallo, Lilia Argue, MD  amitriptyline (ELAVIL) 25 MG tablet Take 1 tablet (25 mg total) by mouth at bedtime. Patient not taking: Reported on 08/10/2015 03/09/15 08/22/15  Anson Fret, MD  eletriptan (RELPAX) 40 MG tablet Take 1 tablet (40 mg total) by mouth as needed for migraine. May repeat in 2 hours if headache persists or recurs. Patient not taking: Reported on 08/10/2015 06/07/15 08/22/15  Anson Fret, MD    Family History Family History  Problem Relation Age of Onset  . Diabetes Mother    . Hypertension Mother   . Hyperlipidemia Mother   . Hypertension Father   . Hyperlipidemia Father   . Prostate cancer Father   . Diabetes Father   . Anesthesia problems Neg Hx   . Hypotension Neg Hx   . Malignant hyperthermia Neg Hx   . Pseudochol deficiency Neg Hx     Social History Social History   Tobacco Use  . Smoking status: Never Smoker  . Smokeless tobacco: Never Used  Substance Use Topics  . Alcohol use: No  . Drug use: No     Allergies   Relpax [eletriptan]; Oxycodone; and Phenergan [promethazine hcl]  Review of Systems Review of Systems  All other systems reviewed and are negative.    Physical Exam Updated Vital Signs BP (!) 153/82 (BP Location: Left Arm)   Pulse 75   Temp 98.3 F (36.8 C) (Oral)   Resp 20   Ht 5\' 6"  (1.676 m)   Wt 81.6 kg   SpO2 100%   BMI 29.05 kg/m   Physical Exam  Nursing note and vitals reviewed.  47 year old female, appears uncomfortable, but is in no acute distress. Vital signs are significant for elevated systolic blood pressure. Oxygen saturation is 100%, which is normal. Head is normocephalic and atraumatic. PERRLA, EOMI. Oropharynx is clear. Neck is nontender and supple without adenopathy or JVD. Back is nontender and there is no CVA tenderness. Lungs are clear without rales, wheezes, or rhonchi. Chest is nontender. Heart has regular rate and rhythm without murmur. Abdomen is soft, flat, nontender without masses or hepatosplenomegaly and peristalsis is normoactive. Extremities have no cyanosis or edema, full range of motion is present. Skin is warm and dry without rash. Neurologic: Mental status is normal, cranial nerves are intact, there are no motor or sensory deficits.  ED Treatments / Results   Procedures Procedures   Medications Ordered in ED Medications  sodium chloride 0.9 % bolus 1,000 mL (0 mLs Intravenous Stopped 08/21/18 0214)  metoCLOPramide (REGLAN) injection 10 mg (10 mg Intravenous Given  08/21/18 0137)  diphenhydrAMINE (BENADRYL) injection 25 mg (25 mg Intravenous Given 08/21/18 0138)  ketorolac (TORADOL) 30 MG/ML injection 30 mg (30 mg Intravenous Given 08/21/18 0138)  sodium chloride 0.9 % bolus 1,000 mL ( Intravenous Stopped 08/21/18 0415)  prochlorperazine (COMPAZINE) injection 10 mg (10 mg Intravenous Given 08/21/18 0308)  diphenhydrAMINE (BENADRYL) injection 25 mg (25 mg Intravenous Given 08/21/18 0308)     Initial Impression / Assessment and Plan / ED Course  I have reviewed the triage vital signs and the nursing notes.  Migraine headache with aura.  No red flags to suggest more serious causes of headache such as aneurysm or meningitis.  Old records are reviewed, and she has 2 prior ED visits for migraine headaches over the last 6 years.  She will be given a migraine cocktail of normal saline, metoclopramide, diphenhydramine, ketorolac.  3:13 AM She states only partial relief of headache and desires additional medication.  She is given additional liter of saline and an dose of prochlorperazine.  4:19 AM She feels much better after above-noted treatment and feels she is ready for discharge.  Advised to follow-up with PCP.   Final Clinical Impressions(s) / ED Diagnoses   Final diagnoses:  Migraine without aura and without status migrainosus, not intractable    ED Discharge Orders    None       Dione Booze, MD 08/21/18 [phone]

## 2018-08-24 NOTE — Telephone Encounter (Addendum)
Received approval notice from [employer] for Aimovig 140 mg.   Approved from 08/20/18 through 08/23/19. Reference # [id number]  For any updates to pt's information contact [phone].  [facility] pharmacy and informed them of the approval. Susan Hopkins verbalized understanding and appreciation for the call.

## 2018-08-26 ENCOUNTER — Ambulatory Visit: Payer: BLUE CROSS/BLUE SHIELD

## 2018-08-27 ENCOUNTER — Encounter: Payer: Self-pay | Admitting: Family Medicine

## 2018-08-27 ENCOUNTER — Encounter: Payer: Self-pay | Admitting: *Deleted

## 2018-08-27 ENCOUNTER — Ambulatory Visit (INDEPENDENT_AMBULATORY_CARE_PROVIDER_SITE_OTHER): Payer: BLUE CROSS/BLUE SHIELD | Admitting: Family Medicine

## 2018-08-27 ENCOUNTER — Other Ambulatory Visit: Payer: Self-pay

## 2018-08-27 VITALS — BP 118/64 | HR 80 | Temp 98.5°F | Wt 181.0 lb

## 2018-08-27 DIAGNOSIS — R21 Rash and other nonspecific skin eruption: Secondary | ICD-10-CM | POA: Diagnosis not present

## 2018-08-27 MED ORDER — PREDNISONE 10 MG PO TABS: 50.0000 mg | ORAL_TABLET | Freq: Every day | ORAL | 0 refills | Status: AC

## 2018-08-27 NOTE — Progress Notes (Signed)
   Subjective:    Patient ID: Susan Hopkins is a 47 y.o. female presenting with Rash  on 08/27/2018  HPI: 3rd visit in 1-2 months for same. Has rash and extensive itching. Has been told it was bug bites and no one else in the house is affected. Treated with OTC cortisone cream and quick steroid taper. She has been getting worse. Rash is worse on lower extremity, but has some on hands, shoulder, chest, back and covered areas now. No change in detergent, soaps, lotions, etc.  Review of Systems  Constitutional: Negative for chills and fever.  Respiratory: Negative for shortness of breath.   Cardiovascular: Negative for chest pain.  Gastrointestinal: Negative for abdominal pain, nausea and vomiting.  Genitourinary: Negative for dysuria.  Skin: Positive for rash.      Objective:    BP 118/64   Pulse 80   Temp 98.5 F (36.9 C) (Oral)   Wt 181 lb (82.1 kg)   SpO2 99%   BMI 29.21 kg/m  Physical Exam  Constitutional: She is oriented to person, place, and time. She appears well-developed and well-nourished. No distress.  HENT:  Head: Normocephalic and atraumatic.  Eyes: No scleral icterus.  Neck: Neck supple.  Cardiovascular: Normal rate.  Pulmonary/Chest: Effort normal.  Abdominal: Soft.  Neurological: She is alert and oriented to person, place, and time.  Skin: Rash noted.  Large papules on erythematous base, see photo  Psychiatric: She has a normal mood and affect.                    Assessment & Plan:   Problem List Items Addressed This Visit      Unprioritized   Rash and nonspecific skin eruption - Primary    Longer trial of steroid and derm referral--may need biopsy?      Relevant Medications   predniSONE (DELTASONE) 10 MG tablet   Other Relevant Orders   Ambulatory referral to Dermatology       Total face-to-face time with patient: 15 minutes. Over 50% of encounter was spent on counseling and coordination of care. Return in about 4 weeks (around  09/24/2018), or if symptoms worsen or fail to improve.  Reva Boresanya S Nannette Zill 08/27/2018 4:43 PM

## 2018-08-27 NOTE — Patient Instructions (Signed)
Neurodermatitis Neurodermatitis is inflammation and thickening of the skin. It is caused by severe itchiness, which leads to repeated scratching and rubbing of the skin. It can happen anywhere on the body. Common places include the neck, head, arms, and legs. Neurodermatitis may have mental or emotional (psychogenic) causes that must be treated. Usually, this is a lifelong (chronic) condition, but in some cases it may go away on its own or with treatment. What are the causes? This condition is caused by repeated scratching and rubbing of the skin. This happens because of feelings of severe itchiness. Often, the cause of itchiness is not known. Common causes include:  Materials or substances that irritate the skin.  Skin conditions, such as chronic dermatitis or eczema.  Allergic reaction.  In some cases, neurodermatitis may have psychogenic causes, such as anxiety or stress. What increases the risk? This condition is more likely to develop in:  People who have dry skin or other skin conditions.  People who have anxiety disorders or high amounts of stress.  People who are 20-50 years of age.  Women.  People who are exposed to irritating chemicals.  People who spend time in hot places or sweat a lot.  What are the signs or symptoms? The main symptom of this condition is one or more patches of skin that are red, swollen, itchy, and thicker than normal skin. These patches can be anywhere on the body, but they are often found on the head, neck, legs, and arms. Neurodermatitis can also affect the genital and anal areas. In severe cases, bleeding, crusting, and scaling of the skin can occur. Symptoms often come and go over time. The frequency and severity of symptoms varies from person to person. How is this diagnosed? This condition is diagnosed based on your medical history and a physical exam of your skin. You may be referred to a health care provider who specializes in skin care  (dermatologist). You may also have tests, including:  Skin allergy tests. These tests will determine if you have an allergy that is causing your condition.  Tests for certain infections, hemorrhoids, psoriasis, or other conditions. These tests may be done if your groin or anal area is inflamed.  In some cases, your health care provider may remove a small amount of skin cells to be examined under a microscope (biopsy). How is this treated? This condition is managed by stopping all scratching and rubbing of the affected area. It is also important to remove all skin irritants and treat any underlying causes of neurodermatitis. Depending on the cause, your health care provider may recommend certain medicines, such as:  Creams, lotions, or pills to reduce inflammation and itching (corticosteroids).  Medicines to prevent or treat infection (antibiotics).  Medicines to relieve allergy symptoms (antihistamines).  Therapy to learn how to stop feelings of itchiness and stop scratching (behavioral therapy or psychotherapy) may be a treatment option. Your health care provider may recommend a doctor who specializes in human behavior (psychologist). Follow these instructions at home: Skin Care  Avoid scratching and rubbing your skin. This is the best way to manage neurodermatitis and prevent it from returning.  Keep your skin clean and moisturized. ? Avoid very hot water. ? Apply lotion at least one time per day. ? Avoid products, such as soaps and lotions, that have harsh chemicals, scents, and dyes. ? Try to shower and take baths only as often as you need to. Frequent bathing can dry out your skin.  Drink enough fluid to   keep your urine clear or pale yellow.  Always wear sunscreen. General instructions  Use over-the-counter and prescription medicines only as told by your health care provider.  Avoid tight or rough clothing that irritates your skin.  Avoid irritating chemicals.  Pay  attention to your symptoms. Watch for things that trigger itching and scratching.  Keep all follow-up visits as told by your health care provider. This is important. Contact a health care provider if:  Your condition gets worse or does not get better after 3-4 days of treatment.  You have blood or fluid leaking from an irritated patch of skin.  You have a fever. This information is not intended to replace advice given to you by your health care provider. Make sure you discuss any questions you have with your health care provider. Document Released: 01/09/2005 Document Revised: 05/09/2016 Document Reviewed: 04/19/2015 Elsevier Interactive Patient Education  2018 [employer]vier Inc[employer]  

## 2018-08-29 ENCOUNTER — Encounter: Payer: Self-pay | Admitting: Family Medicine

## 2018-08-29 NOTE — Assessment & Plan Note (Addendum)
Worsening despite treatment and no diagnosis is clearly evident. Longer trial of steroid and derm referral--may need biopsy?

## 2018-09-09 ENCOUNTER — Ambulatory Visit: Payer: BLUE CROSS/BLUE SHIELD | Admitting: Physical Therapy

## 2018-09-10 ENCOUNTER — Ambulatory Visit: Payer: BLUE CROSS/BLUE SHIELD | Attending: Neurology | Admitting: Physical Therapy

## 2018-09-10 ENCOUNTER — Other Ambulatory Visit: Payer: Self-pay

## 2018-09-10 DIAGNOSIS — R293 Abnormal posture: Secondary | ICD-10-CM | POA: Diagnosis present

## 2018-09-10 DIAGNOSIS — M62838 Other muscle spasm: Secondary | ICD-10-CM

## 2018-09-10 DIAGNOSIS — M542 Cervicalgia: Secondary | ICD-10-CM

## 2018-09-10 NOTE — Addendum Note (Signed)
Addended by: Dorie Rank D on: 09/10/2018 02:33 PM   Modules accepted: Orders

## 2018-09-10 NOTE — Therapy (Signed)
[facility] [address] [place], [place], [address] Phone: [phone]   Fax:  [phone]  Physical Therapy Evaluation  Patient Details  Name: Susan Hopkins MRN: [id number] Date of Birth: 10/27/1971 Referring Provider (PT): Anson Fret, MD   Encounter Date: 09/10/2018  PT End of Session - 09/10/18 1400    Visit Number  1    Date for PT Re-Evaluation  11/05/18    Authorization Type  bcbs    PT Start Time  1105    PT Stop Time  1143    PT Time Calculation (min)  38 min    Activity Tolerance  Patient tolerated treatment well       Past Medical History:  Diagnosis Date  . ADVANCED MATERNAL AGE 70/02/2008  . ANEMIA, IRON DEFICIENCY, UNSPEC. 02/12/2007   after IUD  . Constipation   . Contact dermatitis 08/27/2011  . Diabetes mellitus without complication (HCC)    gestational diab. currently & with infant #2  . DIABETES MELLITUS, GESTATIONAL, HX OF 05/18/2008  . Diabetes mellitus, type II (HCC)    Hbg A1C 6.9 after pregancy, now on Metformin  . Diarrhea   . 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 Gastric ulcer    history of  . GERD (gastroesophageal reflux disease)    doesn't take anything for acid reflux  . Gestational diabetes A2/B 07/20/2012   Fetal echo-nml-under media tab, needs baseline labs-nml with nml TSH and 24 hour urine-68mg . To see optho in Nov 2013-Per pt. Ok, serial u/s for growth, 2x/wk testing at 32 wks.   . Hemorrhoids   . 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 IUD complication (HCC) 07/28/2015  . Migraine headache    last migraine in Sept 2012    Past Surgical History:  Procedure Laterality Date  . CESAREAN SECTION N/A 01/22/2013   Procedure: CESAREAN SECTION;  Surgeon: Reva Bores, MD;  Location: [facility];  Service: Obstetrics;  Laterality: N/A;  Repeat  . CESAREAN SECTION  2005  . CESAREAN SECTION  2010  . CHOLECYSTECTOMY  11/28/2011   Procedure: LAPAROSCOPIC CHOLECYSTECTOMY WITH INTRAOPERATIVE CHOLANGIOGRAM;  Surgeon: Wilmon Arms. Corliss Skains, MD;  Location: [facility];  Service: General;  Laterality: N/A;  . DILATION AND CURETTAGE OF UTERUS  2009  . female circumcision  51  . TONSILLECTOMY  1989    There were no vitals filed for this visit.   Subjective Assessment - 09/10/18 1109    Subjective  Pt has had migrains for years and a couple weeks ago had to go to ER due to pain.  Reprots it starts like neck pain.     Patient Stated Goals  have less pain    Currently in Pain?  Yes    Pain Score  5     Pain Location  Neck    Pain Orientation  Right;Left    Pain Descriptors / Indicators  Tightness    Pain Type  Chronic pain    Pain Onset  More than a month ago    Pain Frequency  Intermittent    Aggravating Factors   changed medication had made it worse    Pain Relieving Factors  medication for migrains, muscle relaxers, heat    Effect of Pain on Daily Activities  limited in providing childcare         [facility] PT Assessment - 09/10/18 0001      Assessment   Medical Diagnosis  M79.18 (ICD-10-CM) -  Cervical myofascial pain syndrome    Referring Provider (PT)  Anson Fret, MD    Onset Date/Surgical Date  --   migrain started 2008; neck pain 2012   Prior Therapy  No      Precautions   Precautions  None      Restrictions   Weight Bearing Restrictions  No      Balance Screen   Has the patient fallen in the past 6 months  No      Home Environment   Living Environment  Private residence    Living Arrangements  Spouse/significant other;Children   4 children     Prior Function   Level of Independence  Independent    Vocation  Part time employment    Vocation Requirements  medical interpreter      Cognition   Overall Cognitive Status  Within Functional Limits for tasks assessed      Observation/Other Assessments   Focus on Therapeutic Outcomes (FOTO)   51% limited      Posture/Postural Control    Posture/Postural Control  Postural limitations    Postural Limitations  Rounded Shoulders;Forward head;Increased thoracic kyphosis      ROM / Strength   AROM / PROM / Strength  AROM;PROM;Strength      AROM   Overall AROM Comments  WNL bialteral shoulders    AROM Assessment Site  Cervical    Cervical Flexion  35    Cervical Extension  35   +pain on Rt   Cervical - Right Side Bend  40    Cervical - Left Side Bend  40   +pain on Rt   Cervical - Right Rotation  40    Cervical - Left Rotation  35   + pain on Rt side     Strength   Overall Strength Comments  bilateral shoulders  WNL      Palpation   Palpation comment  tight and tender upper traps and suboccipitals, pecs      Special Tests    Special Tests  Cervical    Cervical Tests  Spurling's;Dictraction      Spurling's   Findings  Positive    Side  Left    Comment  pain on Rt      Distraction Test   Findngs  Positive    Comment  less pain                Objective measurements completed on examination: See above findings.      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 without being limited by pain    Baseline  able to do 2 days/week    Time  8    Period  Weeks    Status  New    Target Date  11/05/18      PT LONG TERM GOAL #2   Title  Pt will be able participate in a normal day of cleaning and cooking without stopping due to pain    Time  8    Period  Weeks    Status  New    Target Date  11/05/18      PT LONG TERM GOAL #3   Title  pt will be ind with advanced HEP    Time  8    Period  Weeks    Status  New    Target Date  11/05/18      PT LONG TERM GOAL #4   Title  Pt will demonstrate cervical  rotation 50 deg or more Rt and Lt for improved ability to look when driving    Time  8    Period  Weeks    Status  New    Target Date  11/05/18             Plan - 09/10/18 1410    Clinical Impression Statement  Pt presents to skilled PT with tension component to headaches.  She has been having crhonic headaches for over 10 yeas.  Pt feels tension in neck that contributes to her headaches.  She is limited with her normal acitvities due to pain.  Pt has limited cervical ROM and demonstrates decreased posture.  Pt has positive spurling and distraction tests.  She has soft tissue adhesions as mentioned above.  Pt will benefit from skilled PT to address impairments and return to maximum function.    History and Personal Factors relevant to plan of care:  chronic pain migrain, history of abdominal surgeries    Clinical Presentation  Evolving    Clinical Presentation due to:  Pt has been feeling more tension in neck since migrains began    Clinical Decision Making  Moderate    Rehab Potential  Excellent    PT Frequency  2x / week    PT Duration  8 weeks    PT Treatment/Interventions  ADLs/Self Care Home Management;Biofeedback;Cryotherapy;Electrical Stimulation;Iontophoresis 4mg /ml Dexamethasone;Moist Heat;Traction;Therapeutic exercise;Balance training;Patient/family education;Neuromuscular re-education;Manual techniques;Dry needling;Taping    PT Next Visit Plan  traction, dry needling    Recommended Other Services  eval 09/10/18    Consulted and Agree with Plan of Care  Patient       Patient will benefit from skilled therapeutic intervention in order to improve the following deficits and impairments:  Increased fascial restricitons, Pain, Increased muscle spasms, Postural dysfunction, Decreased strength, Decreased range of motion  Visit Diagnosis: Other muscle spasm  Abnormal posture  Cervicalgia     Problem List Patient Active Problem List   Diagnosis Date Noted  . Acute pain of  both shoulders 07/03/2018  . Tension headache 07/03/2018  . Flea bite of lower leg 07/03/2018  . Rash and nonspecific skin eruption 06/24/2018  . Bite, insect 06/15/2018  . Dry skin 01/14/2018  . Stress incontinence 09/03/2017  . Type 2 diabetes mellitus without complication, without long-term current use of insulin (HCC) 09/03/2017  . Seasonal allergic rhinitis 09/03/2017  . Perimenopausal 09/03/2017  . Vaginal discharge 08/10/2016  . Nasal pain 05/08/2016  . Fatigue 05/07/2016  . Vitamin D deficiency 01/05/2016  . Essential hypertension, benign 12/22/2015  . Venous (peripheral) insufficiency 11/05/2015  . Daily headache 03/09/2015  . Elevated liver enzymes 08/26/2012  . Mammographic breast lesion 07/30/2012  . Overweight (BMI 25.0-29.9) 10/15/2011  . Migraine 01/09/2011  . ANEMIA, IRON DEFICIENCY, UNSPEC. 02/12/2007  . GASTROESOPHAGEAL REFLUX, NO ESOPHAGITIS 02/12/2007    Vincente Poli, PT 09/10/2018, 2:21 PM  [facility] [address] [place], [place], [address] Phone: [phone]   Fax:  [phone]  Name: Maddisen  Pettie MRN: [id number] Date of Birth: 30-Jul-1971

## 2018-09-14 ENCOUNTER — Ambulatory Visit: Payer: BLUE CROSS/BLUE SHIELD | Admitting: Physical Therapy

## 2018-09-17 ENCOUNTER — Ambulatory Visit: Payer: BLUE CROSS/BLUE SHIELD | Attending: Neurology | Admitting: Physical Therapy

## 2018-09-17 ENCOUNTER — Encounter: Payer: Self-pay | Admitting: Physical Therapy

## 2018-09-17 DIAGNOSIS — M62838 Other muscle spasm: Secondary | ICD-10-CM

## 2018-09-17 DIAGNOSIS — R293 Abnormal posture: Secondary | ICD-10-CM | POA: Diagnosis present

## 2018-09-17 DIAGNOSIS — M542 Cervicalgia: Secondary | ICD-10-CM | POA: Diagnosis present

## 2018-09-17 NOTE — Therapy (Signed)
[facility] [address] [place], [place], [address] Phone: [phone]   Fax:  [phone]  Physical Therapy Treatment  Patient Details  Name: Susan Hopkins MRN: [id number] Date of Birth: September 20, 1971 Referring Provider (PT): Anson Fret, MD   Encounter Date: 09/17/2018  PT End of Session - 09/17/18 1000    Visit Number  2    Date for PT Re-Evaluation  11/05/18    Authorization Type  bcbs    PT Start Time  0931    PT Stop Time  1015    PT Time Calculation (min)  44 min       Past Medical History:  Diagnosis Date  . ADVANCED MATERNAL AGE 57/02/2008  . ANEMIA, IRON DEFICIENCY, UNSPEC. 02/12/2007   after IUD  . Constipation   . Contact dermatitis 08/27/2011  . Diabetes mellitus without complication (HCC)    gestational diab. currently & with infant #2  . DIABETES MELLITUS, GESTATIONAL, HX OF 05/18/2008  . Diabetes mellitus, type II (HCC)    Hbg A1C 6.9 after pregancy, now on Metformin  . Diarrhea   . 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 Gastric ulcer    history of  . GERD (gastroesophageal reflux disease)    doesn't take anything for acid reflux  . Gestational diabetes A2/B 07/20/2012   Fetal echo-nml-under media tab, needs baseline labs-nml with nml TSH and 24 hour urine-68mg . To see optho in Nov 2013-Per pt. Ok, serial u/s for growth, 2x/wk testing at 32 wks.   . Hemorrhoids   . 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 IUD complication (HCC) 07/28/2015  . Migraine headache    last migraine in Sept 2012    Past Surgical History:  Procedure Laterality Date  . CESAREAN SECTION N/A 01/22/2013   Procedure: CESAREAN SECTION;  Surgeon: Reva Bores, MD;  Location: [facility];  Service: Obstetrics;  Laterality: N/A;  Repeat  . CESAREAN SECTION  2005  . CESAREAN SECTION  2010  . CHOLECYSTECTOMY  11/28/2011   Procedure: LAPAROSCOPIC CHOLECYSTECTOMY WITH INTRAOPERATIVE  CHOLANGIOGRAM;  Surgeon: Wilmon Arms. Corliss Skains, MD;  Location: [facility];  Service: General;  Laterality: N/A;  . DILATION AND CURETTAGE OF UTERUS  2009  . female circumcision  55  . TONSILLECTOMY  1989    There were no vitals filed for this visit.  Subjective Assessment - 09/17/18 0932    Subjective  Pt states she has a headache today.    Currently in Pain?  Yes    Pain Score  5     Pain Location  Neck    Pain Orientation  Right    Pain Descriptors / Indicators  Tightness    Pain Type  Chronic pain    Pain Radiating Towards  from right shoulder to the side of the head on right    Pain Onset  More than a month ago    Pain Frequency  Intermittent    Aggravating Factors   in the process of changing medications, driving a long time    Pain Relieving Factors  meds and heat    Multiple Pain Sites  No                       [facility] Adult PT Treatment/Exercise - 09/17/18 0001      Self-Care   Self-Care  Posture    Posture  educated and performed correct sitting posture during cervical ROM  Exercises   Exercises  Neck      Neck Exercises: Seated   Cervical Rotation  Right;Left;5 reps    Lateral Flexion  Right;Left;5 reps      Modalities   Modalities  Traction      Traction   Type of Traction  Cervical    Min (lbs)  5    Max (lbs)  12    Hold Time  60    Rest Time  10    Time  15      Manual Therapy   Manual Therapy  Soft tissue mobilization    Soft tissue mobilization  suboccipitals, temporalis, scalenes, cervical paraspinals                  PT Long Term Goals - 09/10/18 1136      PT LONG TERM GOAL #1   Title  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 without being limited by pain    Baseline  able to do 2 days/week    Time  8    Period  Weeks    Status  New    Target Date  11/05/18      PT LONG TERM GOAL #2   Title  Pt will be able participate in a normal day of cleaning and cooking without stopping due to pain     Time  8    Period  Weeks    Status  New    Target Date  11/05/18      PT LONG TERM GOAL #3   Title  pt will be ind with advanced HEP    Time  8    Period  Weeks    Status  New    Target Date  11/05/18      PT LONG TERM GOAL #4   Title  Pt will demonstrate cervical rotation 50 deg or more Rt and Lt for improved ability to look when driving    Time  8    Period  Weeks    Status  New    Target Date  11/05/18            Plan - 09/17/18 1018    Clinical Impression Statement  Patient had good response from [facility] and traction.  She has soft tissue and fascial restriction Rt>Lt and experienced release of soft tissue and increased pliabilty after STM. Pt had reduced symptoms after both traction and STM.  Pt educated in posture and to practice at home with cervical ROM.  Pt will benefit from skilled PT to continue working on soft tissue and increased ROM and posture.    PT Treatment/Interventions  ADLs/Self Care Home Management;Biofeedback;Cryotherapy;Electrical Stimulation;Iontophoresis 4mg /ml Dexamethasone;Moist Heat;Traction;Therapeutic exercise;Balance training;Patient/family education;Neuromuscular re-education;Manual techniques;Dry needling;Taping    PT Next Visit Plan  review posture and give cervical ROM for HEP, traction increased pull as tolerated, STM or DN to cervical musculature    Recommended Other Services  eval 5/40/98 cert signed    Consulted and Agree with Plan of Care  Patient       Patient will benefit from skilled therapeutic intervention in order to improve the following deficits and impairments:  Increased fascial restricitons, Pain, Increased muscle spasms, Postural dysfunction, Decreased strength, Decreased range of motion  Visit Diagnosis: Other muscle spasm  Abnormal posture  Cervicalgia     Problem List Patient Active Problem List   Diagnosis Date Noted  . Acute pain of both shoulders 07/03/2018  .  Tension headache 07/03/2018  . Flea bite of  lower leg 07/03/2018  . Rash and nonspecific skin eruption 06/24/2018  . Bite, insect 06/15/2018  . Dry skin 01/14/2018  . Stress incontinence 09/03/2017  . Type 2 diabetes mellitus without complication, without long-term current use of insulin (HCC) 09/03/2017  . Seasonal allergic rhinitis 09/03/2017  . Perimenopausal 09/03/2017  . Vaginal discharge 08/10/2016  . Nasal pain 05/08/2016  . Fatigue 05/07/2016  . Vitamin D deficiency 01/05/2016  . Essential hypertension, benign 12/22/2015  . Venous (peripheral) insufficiency 11/05/2015  . Daily headache 03/09/2015  . Elevated liver enzymes 08/26/2012  . Mammographic breast lesion 07/30/2012  . Overweight (BMI 25.0-29.9) 10/15/2011  . Migraine 01/09/2011  . ANEMIA, IRON DEFICIENCY, UNSPEC. 02/12/2007  . GASTROESOPHAGEAL REFLUX, NO ESOPHAGITIS 02/12/2007    Vincente Poli, PT 09/17/2018, 10:24 AM  [facility] [address] [place], [place], [address] Phone: [phone]   Fax:  [phone]  Name: Susan Hopkins MRN: [id number] Date of Birth: 05-28-1971

## 2018-09-23 ENCOUNTER — Encounter: Payer: Self-pay | Admitting: Physical Therapy

## 2018-09-23 ENCOUNTER — Ambulatory Visit: Payer: BLUE CROSS/BLUE SHIELD | Admitting: Physical Therapy

## 2018-09-23 DIAGNOSIS — M62838 Other muscle spasm: Secondary | ICD-10-CM

## 2018-09-23 DIAGNOSIS — M542 Cervicalgia: Secondary | ICD-10-CM

## 2018-09-23 DIAGNOSIS — R293 Abnormal posture: Secondary | ICD-10-CM

## 2018-09-23 NOTE — Patient Instructions (Signed)
Use of pillow height to provide neutral support in sidelying

## 2018-09-23 NOTE — Therapy (Addendum)
[facility] [address] [place], [place], [address] Phone: [phone]   Fax:  [phone]  Physical Therapy Treatment  Patient Details  Name: Susan Hopkins MRN: [id number] Date of Birth: [birth year] Referring Provider (PT): Melvenia Beam, MD   Encounter Date: 09/23/2018  PT End of Session - 09/23/18 1317    Visit Number  3    Date for PT Re-Evaluation  11/05/18    Authorization Type  bcbs    PT Start Time  1233    PT Stop Time  1322    PT Time Calculation (min)  49 min    Activity Tolerance  Patient tolerated treatment well       Past Medical History:  Diagnosis Date  . ADVANCED MATERNAL AGE 63/02/2008  . ANEMIA, IRON DEFICIENCY, UNSPEC. 02/12/2007   after IUD  . Constipation   . Contact dermatitis 08/27/2011  . Diabetes mellitus without complication ([place])    gestational diab. currently & with infant #2  . DIABETES MELLITUS, GESTATIONAL, HX OF 05/18/2008  . Diabetes mellitus, type II ([place])    Hbg A1C 6.9 after pregancy, now on Metformin  . Diarrhea   . 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 Gastric ulcer    history of  . GERD (gastroesophageal reflux disease)    doesn't take anything for acid reflux  . Gestational diabetes A2/B 07/20/2012   Fetal echo-nml-under media tab, needs baseline labs-nml with nml TSH and 24 hour urine-40m. To see optho in Nov 2013-Per pt. Ok, serial u/s for growth, 2x/wk testing at 32 wks.   . Hemorrhoids   . 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 IUD complication ([facility] 82/87/8676 . Migraine headache    last migraine in Sept 2012    Past Surgical History:  Procedure Laterality Date  . CESAREAN SECTION N/A 01/22/2013   Procedure: CESAREAN SECTION;  Surgeon: TDonnamae Jude MD;  [facility];  Service: Obstetrics;  Laterality: N/A;  Repeat  . CESAREAN SECTION  2005  . CESAREAN SECTION  2010  . CHOLECYSTECTOMY  11/28/2011    Procedure: LAPAROSCOPIC CHOLECYSTECTOMY WITH INTRAOPERATIVE CHOLANGIOGRAM;  Surgeon: MImogene Burn TGeorgette Dover MD;  [facility]  Service: General;  Laterality: N/A;  . DILATION AND CURETTAGE OF UTERUS  2009  . female circumcision  157 . TONSILLECTOMY  1989    There were no vitals filed for this visit.  Subjective Assessment - 09/23/18 1236    Subjective  Pt reports right neck pain and slight headache today.  Headache is worse than the neck.  Pt woke up with the headache.      Patient Stated Goals  have less pain    Currently in Pain?  Yes    Pain Score  5     Pain Location  Head    Pain Orientation  Right    Pain Descriptors / Indicators  Aching    Pain Type  Chronic pain    Pain Onset  More than a month ago    Pain Frequency  Intermittent                       [facility] Adult PT Treatment/Exercise - 09/23/18 0001      Traction   Type of Traction  Cervical    Min (lbs)  8    Max (lbs)  15    Hold Time  60    Rest Time  10  Time  10      Manual Therapy   Manual Therapy  Soft tissue mobilization;Joint mobilization    Manual therapy comments  strain/counterstrain Rt supoccipitals    Joint Mobilization  upglides Rt C5-C7 Gr III/IV, downglides Lt C7-T3 Gr III/IV    Soft tissue mobilization  suboccipitals, scalenes, cervical paraspinals, upper trap bil      Neck Exercises: Stretches   Upper Trapezius Stretch  Left;Right;30 seconds   performed manually by PT to monitor tone/reaction   Levator Stretch  Right;Left;30 seconds   performed manually by PT to monitor tone/reaction             PT Education - 09/23/18 1313    Person(s) Educated  Patient    Methods  Explanation    Comprehension  Verbalized understanding          PT Long Term Goals - 09/10/18 1136      PT LONG TERM GOAL #1   Title  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 without being limited by pain    Baseline  able to do 2 days/week    Time  8    Period   Weeks    Status  New    Target Date  11/05/18      PT LONG TERM GOAL #2   Title  Pt will be able participate in a normal day of cleaning and cooking without stopping due to pain    Time  8    Period  Weeks    Status  New    Target Date  11/05/18      PT LONG TERM GOAL #3   Title  pt will be ind with advanced HEP    Time  8    Period  Weeks    Status  New    Target Date  11/05/18      PT LONG TERM GOAL #4   Title  Pt will demonstrate cervical rotation 50 deg or more Rt and Lt for improved ability to look when driving    Time  8    Period  Weeks    Status  New    Target Date  11/05/18            Plan - 09/23/18 1319    Clinical Impression Statement  Pt with signifcant tenderness, soft tissue and joint restrictions today.  Headache was 5/10 upon arrival and she had limited and painful ROM.  She reported 50% imrovement in pain with manual therapy.  Performed mechanical cervical traction again today due to report of relief last session with traction.  PT noted improved release of tone with strain counterstrain, STM and joint mobilization today.  Pt will benefit from dry needling next visit and introduction of neck stretches and stabilization exercises.    Rehab Potential  Excellent    PT Frequency  2x / week    PT Duration  8 weeks    PT Treatment/Interventions  ADLs/Self Care Home Management;Biofeedback;Cryotherapy;Electrical Stimulation;Iontophoresis 92m/ml Dexamethasone;Moist Heat;Traction;Therapeutic exercise;Balance training;Patient/family education;Neuromuscular re-education;Manual techniques;Dry needling;Taping    PT Next Visit Plan  dry needling Lt upper trap, suboccipital group, deep cervical multifidus, teach stretches for HEP    Consulted and Agree with Plan of Care  Patient       Patient will benefit from skilled therapeutic intervention in order to improve the following deficits and impairments:  Increased fascial restricitons, Pain, Increased muscle spasms,  Postural dysfunction, Decreased strength, Decreased  range of motion  Visit Diagnosis: Other muscle spasm  Abnormal posture  Cervicalgia     Problem List Patient Active Problem List   Diagnosis Date Noted  . Acute pain of both shoulders 07/03/2018  . Tension headache 07/03/2018  . Flea bite of lower leg 07/03/2018  . Rash and nonspecific skin eruption 06/24/2018  . Bite, insect 06/15/2018  . Dry skin 01/14/2018  . Stress incontinence 09/03/2017  . Type 2 diabetes mellitus without complication, without long-term current use of insulin ([place]) 09/03/2017  . Seasonal allergic rhinitis 09/03/2017  . Perimenopausal 09/03/2017  . Vaginal discharge 08/10/2016  . Nasal pain 05/08/2016  . Fatigue 05/07/2016  . Vitamin D deficiency 01/05/2016  . Essential hypertension, benign 12/22/2015  . Venous (peripheral) insufficiency 11/05/2015  . Daily headache 03/09/2015  . Elevated liver enzymes 08/26/2012  . Mammographic breast lesion 07/30/2012  . Overweight (BMI 25.0-29.9) 10/15/2011  . Migraine 01/09/2011  . ANEMIA, IRON DEFICIENCY, UNSPEC. 02/12/2007  . GASTROESOPHAGEAL REFLUX, NO ESOPHAGITIS 02/12/2007   Johanna Beuhring, PT 09/23/18 1:27 PM   PHYSICAL THERAPY DISCHARGE SUMMARY  Visits from Start of Care: 3  Current functional level related to goals / functional outcomes: See above   Remaining deficits: See above   Education / Equipment: Initial HEP Plan: Patient agrees to discharge.  Patient goals were not met. Patient is being discharged due to not returning since the last visit.  ?????        Baruch Merl, PT 11/17/18 5:30 AM   [facility] [address] [place], [place], [address] Phone: [phone]   Fax:  [phone]  Name: Susan Hopkins MRN: [id number] Date of Birth: 1971/10/02

## 2018-09-25 ENCOUNTER — Other Ambulatory Visit: Payer: Self-pay | Admitting: Family Medicine

## 2018-09-25 ENCOUNTER — Ambulatory Visit: Payer: BLUE CROSS/BLUE SHIELD | Admitting: Physical Therapy

## 2018-09-25 DIAGNOSIS — I10 Essential (primary) hypertension: Secondary | ICD-10-CM

## 2018-09-29 ENCOUNTER — Ambulatory Visit: Payer: BLUE CROSS/BLUE SHIELD | Admitting: Physical Therapy

## 2018-10-01 ENCOUNTER — Ambulatory Visit: Payer: BLUE CROSS/BLUE SHIELD | Admitting: Physical Therapy

## 2018-11-06 ENCOUNTER — Other Ambulatory Visit: Payer: Self-pay | Admitting: Neurology

## 2018-11-11 ENCOUNTER — Emergency Department (HOSPITAL_BASED_OUTPATIENT_CLINIC_OR_DEPARTMENT_OTHER)
Admission: EM | Admit: 2018-11-11 | Discharge: 2018-11-11 | Disposition: A | Discharge status: Home / Self Care | Payer: BLUE CROSS/BLUE SHIELD | Attending: Emergency Medicine | Admitting: Emergency Medicine

## 2018-11-11 ENCOUNTER — Encounter (HOSPITAL_BASED_OUTPATIENT_CLINIC_OR_DEPARTMENT_OTHER): Payer: Self-pay | Admitting: *Deleted

## 2018-11-11 ENCOUNTER — Other Ambulatory Visit: Payer: Self-pay

## 2018-11-11 DIAGNOSIS — Z79899 Other long term (current) drug therapy: Secondary | ICD-10-CM | POA: Diagnosis not present

## 2018-11-11 DIAGNOSIS — E119 Type 2 diabetes mellitus without complications: Secondary | ICD-10-CM | POA: Insufficient documentation

## 2018-11-11 DIAGNOSIS — G43409 Hemiplegic migraine, not intractable, without status migrainosus: Secondary | ICD-10-CM | POA: Insufficient documentation

## 2018-11-11 DIAGNOSIS — R51 Headache: Secondary | ICD-10-CM | POA: Diagnosis present

## 2018-11-11 LAB — PREGNANCY, URINE: Preg Test, Ur: NEGATIVE

## 2018-11-11 MED ORDER — KETOROLAC TROMETHAMINE 15 MG/ML IJ SOLN
15.0000 mg | Freq: Once | INTRAMUSCULAR | Status: AC
  Administered 2018-11-11: 15 mg via INTRAVENOUS
  Filled 2018-11-11: qty 1

## 2018-11-11 MED ORDER — FENTANYL CITRATE (PF) 100 MCG/2ML IJ SOLN
50.0000 ug | Freq: Once | INTRAMUSCULAR | Status: AC
  Administered 2018-11-11: 50 ug via INTRAVENOUS
  Filled 2018-11-11: qty 2

## 2018-11-11 MED ORDER — PROCHLORPERAZINE EDISYLATE 10 MG/2ML IJ SOLN
10.0000 mg | Freq: Once | INTRAMUSCULAR | Status: AC
  Administered 2018-11-11: 10 mg via INTRAVENOUS
  Filled 2018-11-11: qty 2

## 2018-11-11 MED ORDER — SODIUM CHLORIDE 0.9 % IV BOLUS
1000.0000 mL | Freq: Once | INTRAVENOUS | Status: AC
  Administered 2018-11-11: 1000 mL via INTRAVENOUS

## 2018-11-11 MED ORDER — DIPHENHYDRAMINE HCL 50 MG/ML IJ SOLN
25.0000 mg | Freq: Once | INTRAMUSCULAR | Status: AC
  Administered 2018-11-11: 25 mg via INTRAVENOUS
  Filled 2018-11-11: qty 1

## 2018-11-11 NOTE — ED Provider Notes (Signed)
[facility] Provider Note: Lowella Dell, MD, FACEP  CSN: [id number] MRN: [id number] ARRIVAL: 11/11/18 at 0136 ROOM: MH06/MH06   CHIEF COMPLAINT  Migraine   HISTORY OF PRESENT ILLNESS  11/11/18 1:52 AM Susan Hopkins is a 47 y.o. female with a history of migraines.  She is here with a headache that began yesterday.  She took some Excedrin and went to bed.  When she woke up this morning her headache was worse.  It is located frontally and she rates it a 10 out of 10.  It is like previous migraines.  There is associated photophobia, nausea and vomiting.   Past Medical History:  Diagnosis Date  . ADVANCED MATERNAL AGE 73/02/2008  . ANEMIA, IRON DEFICIENCY, UNSPEC. 02/12/2007   after IUD  . Constipation   . Contact dermatitis 08/27/2011  . Diabetes mellitus without complication ([facility])    gestational diab. currently & with infant #2  . DIABETES MELLITUS, GESTATIONAL, HX OF 05/18/2008  . Diabetes mellitus, type II ([facility])    Hbg A1C 6.9 after pregancy, now on Metformin  . Diarrhea   . 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 Gastric ulcer    history of  . GERD (gastroesophageal reflux disease)    doesn't take anything for acid reflux  . Gestational diabetes A2/B 07/20/2012   Fetal echo-nml-under media tab, needs baseline labs-nml with nml TSH and 24 hour urine-68mg . To see optho in Nov 2013-Per pt. Ok, serial u/s for growth, 2x/wk testing at 32 wks.   . Hemorrhoids   . 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 IUD complication ([facility]) 07/28/2015  . Migraine headache    last migraine in Sept 2012    Past Surgical History:  Procedure Laterality Date  . CESAREAN SECTION N/A 01/22/2013   Procedure: CESAREAN SECTION;  Surgeon: Reva Bores, MD;  Location: [facility];  Service: Obstetrics;  Laterality: N/A;  Repeat  . CESAREAN SECTION  2005  . CESAREAN SECTION  2010  . CHOLECYSTECTOMY  11/28/2011   Procedure: LAPAROSCOPIC CHOLECYSTECTOMY  WITH INTRAOPERATIVE CHOLANGIOGRAM;  Surgeon: Wilmon Arms. Corliss Skains, MD;  Location[facility];  Service: General;  Laterality: N/A;  . DILATION AND CURETTAGE OF UTERUS  2009  . female circumcision  12  . TONSILLECTOMY  1989    Family History  Problem Relation Age of Onset  . Diabetes Mother   . Hypertension Mother   . Hyperlipidemia Mother   . Hypertension Father   . Hyperlipidemia Father   . Prostate cancer Father   . Diabetes Father   . Anesthesia problems Neg Hx   . Hypotension Neg Hx   . Malignant hyperthermia Neg Hx   . Pseudochol deficiency Neg Hx     Social History   Tobacco Use  . Smoking status: Never Smoker  . Smokeless tobacco: Never Used  Substance Use Topics  . Alcohol use: No  . Drug use: No    Prior to Admission medications   Medication Sig Start Date End Date Taking? Authorizing Provider  diclofenac (CATAFLAM) 50 MG tablet TAKE ONE TABLET BY MOUTH ONCE DAILY AS NEEDED FOR ONSET OF HEADACHE. TAKE WITH FOOD 07/28/18   Anson Fret, MD  hydrochlorothiazide (HYDRODIURIL) 25 MG tablet TAKE 1/2 TABLET (12.5 MG TOTAL) BY MOUTH DAILY. 09/28/18   Tillman Sers, DO  ondansetron (ZOFRAN) 4 MG tablet Take 1 tablet (4 mg total) by mouth every 8 (eight) hours as needed for nausea or vomiting. 04/06/18   Daphine Deutscher,  Dale DurhamNancy Carolyn, NP  propranolol ER (INDERAL LA) 60 MG 24 hr capsule TAKE 1 CAPSULE (60 MG TOTAL) BY MOUTH AT BEDTIME. 11/09/18   Anson FretAhern, Antonia B, MD  traMADol Janean Sark(ULTRAM) 50 MG tablet Take 1-2 tablets every 6 hours for pain 07/28/18   Anson FretAhern, Antonia B, MD  Vitamin D, Ergocalciferol, (DRISDOL) 50000 units CAPS capsule TAKE 1 CAPSULE BY MOUTH ONCE A WEEK. FOR 8 WEEKS 07/17/18   Lovena Neighboursiallo, Abdoulaye, MD    Allergies Relpax [eletriptan]; Oxycodone; and Phenergan [promethazine hcl]   REVIEW OF SYSTEMS  Negative except as noted here or in the History of Present Illness.   PHYSICAL EXAMINATION  Initial Vital Signs Blood pressure (!) 142/84, pulse 80, temperature 97.9 F (36.6  C), temperature source Oral, resp. rate 20, height 5\' 6"  (1.676 m), weight 81.6 kg, SpO2 100 %.  Examination General: Well-developed, well-nourished female in no acute distress; appearance consistent with age of record HENT: normocephalic; atraumatic Eyes: pupils equal, round and reactive to light; extraocular muscles intact; photophobia Neck: supple Heart: regular rate and rhythm Lungs: clear to auscultation bilaterally Abdomen: soft; nondistended; nontender; bowel sounds present Extremities: No deformity; full range of motion; pulses normal Neurologic: Awake, alert; motor function intact in all extremities and symmetric; no facial droop Skin: Warm and dry Psychiatric: Moaning   RESULTS  Summary of this visit's results, reviewed by myself:   EKG Interpretation  Date/Time:    Ventricular Rate:    PR Interval:    QRS Duration:   QT Interval:    QTC Calculation:   R Axis:     Text Interpretation:        Laboratory Studies: Results for orders placed or performed during the hospital encounter of 11/11/18 (from the past 24 hour(s))  Pregnancy, urine     Status: None   Collection Time: 11/11/18  1:50 AM  Result Value Ref Range   Preg Test, Ur NEGATIVE NEGATIVE   Imaging Studies: No results found.  ED COURSE and MDM  Nursing notes and initial vitals signs, including pulse oximetry, reviewed.  Vitals:   11/11/18 0141 11/11/18 0343  BP: (!) 142/84 137/79  Pulse: 80 84  Resp: 20 18  Temp: 97.9 F (36.6 C)   TempSrc: Oral   SpO2: 100% 100%  Weight: 81.6 kg   Height: 5\' 6"  (1.676 m)    3:59 AM Patient feels much better after IV fluids and medications.  PROCEDURES    ED DIAGNOSES     ICD-10-CM   1. Sporadic migraine G43.409        Mouhamed Glassco, MD 11/11/18 0400

## 2018-11-11 NOTE — ED Notes (Signed)
Patient verbalizes understanding of discharge instructions. Opportunity for questioning and answers were provided. Armband removed by staff, pt discharged from ED home via POV with family. 

## 2018-11-11 NOTE — ED Notes (Signed)
Pt reports a migraine that started yesterday afternoon. Pt was able to relieve her migraine with excedrin migraine. Pt reports she woke up early this morning with another migraine that would not resolved with excedrin so she came into the ED for evaluation.

## 2018-11-11 NOTE — ED Triage Notes (Signed)
Pt c/o migraine HA

## 2018-11-25 ENCOUNTER — Other Ambulatory Visit: Payer: Self-pay | Admitting: Nurse Practitioner

## 2018-11-30 ENCOUNTER — Other Ambulatory Visit: Payer: Self-pay | Admitting: Neurology

## 2018-11-30 ENCOUNTER — Telehealth: Payer: Self-pay | Admitting: *Deleted

## 2018-11-30 MED ORDER — NORTRIPTYLINE HCL 25 MG PO CAPS: 25.0000 mg | ORAL_CAPSULE | Freq: Every day | ORAL | 3 refills | Status: DC

## 2018-11-30 NOTE — Telephone Encounter (Signed)
Done sent in Nortrip 25mg .

## 2018-11-30 NOTE — Telephone Encounter (Signed)
Patient was in the office today and requested a refill on her Nortriptyline.  Requested this be sent to [facility].  Please call if questions .

## 2018-12-28 ENCOUNTER — Ambulatory Visit: Payer: Self-pay

## 2019-01-04 NOTE — Telephone Encounter (Signed)
I spoke with the staff at [facility]. Diclofenac potassium is not available however Diclofenac Sodium 75 mg is available.

## 2019-01-05 ENCOUNTER — Other Ambulatory Visit: Payer: Self-pay | Admitting: *Deleted

## 2019-01-05 MED ORDER — DICLOFENAC SODIUM 75 MG PO TBEC: DELAYED_RELEASE_TABLET | ORAL | 7 refills | Status: DC

## 2019-01-05 NOTE — Progress Notes (Signed)
Diclofenac potassium 50 mg not available. D/w [facility]. Changed to Diclofenac Sodium 75 mg per v.o. Dr. Lucia Gaskins.

## 2019-01-20 ENCOUNTER — Ambulatory Visit: Payer: BLUE CROSS/BLUE SHIELD | Admitting: Family Medicine

## 2019-02-01 ENCOUNTER — Ambulatory Visit: Payer: BLUE CROSS/BLUE SHIELD | Admitting: Neurology

## 2019-02-02 ENCOUNTER — Encounter: Payer: Self-pay | Admitting: Neurology

## 2019-03-17 ENCOUNTER — Ambulatory Visit: Payer: BLUE CROSS/BLUE SHIELD

## 2019-03-29 ENCOUNTER — Other Ambulatory Visit: Payer: Self-pay | Admitting: Neurology

## 2019-03-30 NOTE — Telephone Encounter (Signed)
Per [place] registry, last refill of Tramadol 50 mg was 11/06/18 #30 by Dr. Lucia Gaskins. Pt only filled once since written on 07/28/18. Will send to Dr. Lucia Gaskins.

## 2019-05-25 ENCOUNTER — Other Ambulatory Visit: Payer: Self-pay | Admitting: Neurology

## 2019-08-06 ENCOUNTER — Other Ambulatory Visit: Payer: Self-pay | Admitting: Neurology

## 2019-08-11 ENCOUNTER — Telehealth: Payer: Self-pay

## 2019-08-11 NOTE — Telephone Encounter (Signed)
Received PA request from covermymeds for pt's aimovig. Pt has not been seen in over one year and needs an appt. Aimovig is also not listed on pt's medication list.  I called pt to discuss and schedule her for an appt if she still wants to be taking aimovig.

## 2019-09-07 ENCOUNTER — Other Ambulatory Visit: Payer: Self-pay

## 2019-09-07 ENCOUNTER — Other Ambulatory Visit: Payer: Self-pay | Admitting: Neurology

## 2019-09-07 DIAGNOSIS — Z20822 Contact with and (suspected) exposure to covid-19: Secondary | ICD-10-CM

## 2019-09-09 LAB — NOVEL CORONAVIRUS, NAA: SARS-CoV-2, NAA: NOT DETECTED

## 2019-09-23 ENCOUNTER — Other Ambulatory Visit: Payer: Self-pay | Admitting: Neurology

## 2019-10-27 ENCOUNTER — Encounter: Payer: Self-pay | Admitting: *Deleted

## 2019-10-27 ENCOUNTER — Other Ambulatory Visit: Payer: Self-pay | Admitting: Neurology

## 2019-11-22 ENCOUNTER — Other Ambulatory Visit: Payer: Self-pay | Admitting: Neurology

## 2019-11-27 ENCOUNTER — Other Ambulatory Visit: Payer: Self-pay | Admitting: Neurology

## 2019-12-07 ENCOUNTER — Other Ambulatory Visit: Payer: Self-pay | Admitting: Neurology

## 2019-12-08 ENCOUNTER — Telehealth: Payer: Self-pay | Admitting: Neurology

## 2019-12-08 ENCOUNTER — Encounter: Payer: Self-pay | Admitting: *Deleted

## 2019-12-08 ENCOUNTER — Other Ambulatory Visit: Payer: Self-pay | Admitting: Neurology

## 2019-12-08 MED ORDER — PROPRANOLOL HCL ER 60 MG PO CP24: 60.0000 mg | ORAL_CAPSULE | Freq: Every day | ORAL | 1 refills | Status: DC

## 2019-12-08 NOTE — Telephone Encounter (Signed)
Pt has scheduled her annual f/u and would like to get her refill on propranolol ER (INDERAL LA) 60 MG 24 hr capsule [facility]

## 2019-12-08 NOTE — Addendum Note (Signed)
Addended by: Minna Antis on: 12/08/2019 03:32 PM   Modules accepted: Orders

## 2019-12-08 NOTE — Telephone Encounter (Signed)
Propranolol refilled x 2 months. Sent her my chart advising her of refill and to please keep Jan FU.

## 2019-12-13 ENCOUNTER — Encounter: Payer: Self-pay | Admitting: Family Medicine

## 2020-01-05 ENCOUNTER — Encounter: Payer: Self-pay | Admitting: Family Medicine

## 2020-01-05 ENCOUNTER — Ambulatory Visit: Payer: BLUE CROSS/BLUE SHIELD | Admitting: Family Medicine

## 2020-01-11 ENCOUNTER — Ambulatory Visit: Payer: BLUE CROSS/BLUE SHIELD | Attending: Internal Medicine

## 2020-01-11 DIAGNOSIS — Z20822 Contact with and (suspected) exposure to covid-19: Secondary | ICD-10-CM

## 2020-01-12 DIAGNOSIS — Z0289 Encounter for other administrative examinations: Secondary | ICD-10-CM

## 2020-01-12 LAB — NOVEL CORONAVIRUS, NAA: SARS-CoV-2, NAA: NOT DETECTED

## 2020-01-12 MED ORDER — PROPRANOLOL HCL ER 60 MG PO CP24: 60.0000 mg | ORAL_CAPSULE | Freq: Every day | ORAL | 0 refills | Status: DC

## 2020-02-08 ENCOUNTER — Telehealth: Payer: Self-pay | Admitting: Neurology

## 2020-02-08 NOTE — Telephone Encounter (Signed)
Patient called wanting to know if she can get a sooner apt (no sooner apt available at the time of call) patient states she is having severe headaches and would like to be seen sooner

## 2020-02-08 NOTE — Telephone Encounter (Addendum)
Spoke with pt and offered sooner appt with Amy NP for tomorrow 2/24. Pt accepted and was scheduled for 9:30 AM. Pt stated she has pain in cheek, nose, eyes, forehead. She denied any congestion, fever, or any respiratory symptoms.  Amy NP aware.

## 2020-02-09 ENCOUNTER — Other Ambulatory Visit: Payer: Self-pay

## 2020-02-09 ENCOUNTER — Ambulatory Visit: Payer: BLUE CROSS/BLUE SHIELD | Admitting: Family Medicine

## 2020-02-09 ENCOUNTER — Encounter: Payer: Self-pay | Admitting: Family Medicine

## 2020-02-09 VITALS — BP 128/76 | HR 87 | Temp 97.6°F | Ht 66.0 in | Wt 188.6 lb

## 2020-02-09 DIAGNOSIS — J302 Other seasonal allergic rhinitis: Secondary | ICD-10-CM | POA: Diagnosis not present

## 2020-02-09 DIAGNOSIS — G43009 Migraine without aura, not intractable, without status migrainosus: Secondary | ICD-10-CM | POA: Diagnosis not present

## 2020-02-09 MED ORDER — PROPRANOLOL HCL ER 60 MG PO CP24: 60.0000 mg | ORAL_CAPSULE | Freq: Every day | ORAL | 3 refills | Status: DC

## 2020-02-09 MED ORDER — TRAMADOL HCL 50 MG PO TABS: ORAL_TABLET | ORAL | 0 refills | Status: AC

## 2020-02-09 NOTE — Patient Instructions (Addendum)
Continue propranolol 60mg  daily. 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   Try Mucinex extended release every 12 hours (can get generic but use plain Mucinex, not multisymptom) Zytrec or Xyzal every day. May take Benadryl 25-50mg  as needed every 6 hours. Try this regimen for two weeks.   Make sure you are staying well hydrated, eat a well balanced diet and get regular exercise   Follow up in 1 year, sooner if needed   Migraine Headache A migraine headache is a very strong throbbing pain on one side or both sides of your head. This type of headache can also cause other symptoms. It can last from 4 hours to 3 days. Talk with your doctor about what things may bring on (trigger) this condition. What are the causes? The exact cause of this condition is not known. This condition may be triggered or caused by:  Drinking alcohol.  Smoking.  Taking medicines, such as: ? Medicine used to treat chest pain (nitroglycerin). ? Birth control pills. ? Estrogen. ? Some blood pressure medicines.  Eating or drinking certain products.  Doing physical activity. Other things that may trigger a migraine headache include:  Having a menstrual period.  Pregnancy.  Hunger.  Stress.  Not getting enough sleep or getting too much sleep.  Weather changes.  Tiredness (fatigue). What increases the risk?  Being 17-64 years old.  Being female.  Having a family history of migraine headaches.  Being Caucasian.  Having depression or anxiety.  Being very overweight. What are the signs or symptoms?  A throbbing pain. This pain may: ? Happen in any area of the head, such as on one side or both sides. ? Make it hard to do daily activities. ? Get worse with physical activity. ? Get worse around bright lights or loud noises.  Other symptoms may include: ? Feeling sick to your stomach (nauseous). ? Vomiting. ? Dizziness. ? Being sensitive to bright lights, loud  noises, or smells.  Before you get a migraine headache, you may get warning signs (an aura). An aura may include: ? Seeing flashing lights or having blind spots. ? Seeing bright spots, halos, or zigzag lines. ? Having tunnel vision or blurred vision. ? Having numbness or a tingling feeling. ? Having trouble talking. ? Having weak muscles.  Some people have symptoms after a migraine headache (postdromal phase), such as: ? Tiredness. ? Trouble thinking (concentrating). How is this treated?  Taking medicines that: ? Relieve pain. ? Relieve the feeling of being sick to your stomach. ? Prevent migraine headaches.  Treatment may also include: ? Having acupuncture. ? Avoiding foods that bring on migraine headaches. ? Learning ways to control your body functions (biofeedback). ? Therapy to help you know and deal with negative thoughts (cognitive behavioral therapy). Follow these instructions at home: Medicines  Take over-the-counter and prescription medicines only as told by your doctor.  Ask your doctor if the medicine prescribed to you: ? Requires you to avoid driving or using heavy machinery. ? Can cause trouble pooping (constipation). You may need to take these steps to prevent or treat trouble pooping:  Drink enough fluid to keep your pee (urine) pale yellow.  Take over-the-counter or prescription medicines.  Eat foods that are high in fiber. These include beans, whole grains, and fresh fruits and vegetables.  Limit foods that are high in fat and sugar. These include fried or sweet foods. Lifestyle  Do not drink alcohol.  Do not use any products that  contain nicotine or tobacco, such as cigarettes, e-cigarettes, and chewing tobacco. If you need help quitting, ask your doctor.  Get at least 8 hours of sleep every night.  Limit and deal with stress. General instructions      Keep a journal to find out what may bring on your migraine headaches. For example, write  down: ? What you eat and drink. ? How much sleep you get. ? Any change in what you eat or drink. ? Any change in your medicines.  If you have a migraine headache: ? Avoid things that make your symptoms worse, such as bright lights. ? It may help to lie down in a dark, quiet room. ? Do not drive or use heavy machinery. ? Ask your doctor what activities are safe for you.  Keep all follow-up visits as told by your doctor. This is important. Contact a doctor if:  You get a migraine headache that is different or worse than others you have had.  You have more than 15 headache days in one month. Get help right away if:  Your migraine headache gets very bad.  Your migraine headache lasts longer than 72 hours.  You have a fever.  You have a stiff neck.  You have trouble seeing.  Your muscles feel weak or like you cannot control them.  You start to lose your balance a lot.  You start to have trouble walking.  You pass out (faint).  You have a seizure. Summary  A migraine headache is a very strong throbbing pain on one side or both sides of your head. These headaches can also cause other symptoms.  This condition may be treated with medicines and changes to your lifestyle.  Keep a journal to find out what may bring on your migraine headaches.  Contact a doctor if you get a migraine headache that is different or worse than others you have had.  Contact your doctor if you have more than 15 headache days in a month. This information is not intended to replace advice given to you by your health care provider. Make sure you discuss any questions you have with your health care provider. Document Revised: 03/26/2019 Document Reviewed: 01/14/2019 Elsevier Patient Education  2020 [employer].    Allergic Rhinitis, Adult Allergic rhinitis is a reaction to allergens in the air. Allergens are tiny specks (particles) in the air that cause your body to have an allergic reaction.  This condition cannot be passed from person to person (is not contagious). Allergic rhinitis cannot be cured, but it can be controlled. There are two types of allergic rhinitis:  Seasonal. This type is also called hay fever. It happens only during certain times of the year.  Perennial. This type can happen at any time of the year. What are the causes? This condition may be caused by:  Pollen from grasses, trees, and weeds.  House dust mites.  Pet dander.  Mold. What are the signs or symptoms? Symptoms of this condition include:  Sneezing.  Runny or stuffy nose (nasal congestion).  A lot of mucus in the back of the throat (postnasal drip).  Itchy nose.  Tearing of the eyes.  Trouble sleeping.  Being sleepy during day. How is this treated? There is no cure for this condition. You should avoid things that trigger your symptoms (allergens). Treatment can help to relieve symptoms. This may include:  Medicines that block allergy symptoms, such as antihistamines. These may be given as a shot, nasal spray,  or pill.  Shots that are given until your body becomes less sensitive to the allergen (desensitization).  Stronger medicines, if all other treatments have not worked. Follow these instructions at home: Avoiding allergens   Find out what you are allergic to. Common allergens include smoke, dust, and pollen.  Avoid them if you can. These are some of the things that you can do to avoid allergens: ? Replace carpet with wood, tile, or vinyl flooring. Carpet can trap dander and dust. ? Clean any mold found in the home. ? Do not smoke. Do not allow smoking in your home. ? Change your heating and air conditioning filter at least once a month. ? During allergy season:  Keep windows closed as much as you can. If possible, use air conditioning when there is a lot of pollen in the air.  Use a special filter for allergies with your furnace and air conditioner.  Plan outdoor  activities when pollen counts are lowest. This is usually during the early morning or evening hours.  If you do go outdoors when pollen count is high, wear a special mask for people with allergies.  When you come indoors, take a shower and change your clothes before sitting on furniture or bedding. General instructions  Do not use fans in your home.  Do not hang clothes outside to dry.  Wear sunglasses to keep pollen out of your eyes.  Wash your hands right away after you touch household pets.  Take over-the-counter and prescription medicines only as told by your doctor.  Keep all follow-up visits as told by your doctor. This is important. Contact a doctor if:  You have a fever.  You have a cough that does not go away (is persistent).  You start to make whistling sounds when you breathe (wheeze).  Your symptoms do not get better with treatment.  You have thick fluid coming from your nose.  You start to have nosebleeds. Get help right away if:  Your tongue or your lips are swollen.  You have trouble breathing.  You feel dizzy or you feel like you are going to pass out (faint).  You have cold sweats. Summary  Allergic rhinitis is a reaction to allergens in the air.  This condition may be caused by allergens. These include pollen, dust mites, pet dander, and mold.  Symptoms include a runny, itchy nose, sneezing, or tearing eyes. You may also have trouble sleeping or feel sleepy during the day.  Treatment includes taking medicines and avoiding allergens. You may also get shots or take stronger medicines.  Get help if you have a fever or a cough that does not stop. Get help right away if you are short of breath. This information is not intended to replace advice given to you by your health care provider. Make sure you discuss any questions you have with your health care provider. Document Revised: 03/23/2019 Document Reviewed: 06/23/2018 Elsevier Patient Education   [place].

## 2020-02-09 NOTE — Progress Notes (Addendum)
PATIENT: Susan Hopkins DOB: Jan 11, 1971  REASON FOR VISIT: follow up HISTORY FROM: patient  Chief Complaint  Patient presents with  . Follow-up    Alone. Rm 9. Patient mentioned that she is starting to have more headaches in the middle of her forehead that radiate down to her cheeks. She can sometimes have pain behind her eyes. She stated that she currently has headache.      HISTORY OF PRESENT ILLNESS: Today 02/09/20  Anola Liedtke is a 49 y.o. female here today for follow up of migraines. She continues propranolol 60mg  daily. She was previously taking Ajovy but was switched to Amovig. She stopped Amovig several months ago as her copay card would no longer work and she could not afford medication. She reports that migraines are well managed. She may have 1-2 migraine days per month. She was taking diclofenac for abortive therapy but this was discontinued due to chronic kidney disease with proteinuria. She uses tramadol 50mg  as needed for abortive therapy with significant relief. She last refilled 30 tablets in 05/2019. She uses this medication sparingly.   She reports that over the past month she has had more headaches. She describes a tension style headache behind her eyes, frontal and maxillary sinus region. Pain radiates to jaw intermittently. She does endorse intermittent congestion, ears feel full and pop, itchy, and watery eyes. She took Zyrtec 1-2 times but not consistently. She denies fever, cough, chills or body aches.    HISTORY: (copied from Dr Trevor Mace note on 07/28/2018)  Interval history 07/28/2018: She has not started the Aimovig yet, she has it at home. She can stop the Nortriptyline. She is having side effects to the Nortriptyline. Recommend decreasing Nortriptyline to 10mg  and then posisble stopping a month after Aimovig. She is not using the OTC medications. She is still on propranolol. Failed Topamax. She has 4 migraines a month, she takes diclofenac and zofran, can last  days untreated. Doing well otherwise.   Interval history 01/26/2018: Migraines have worsened. Started Ajovy. Migraines are more frequent, she is using tylenol, ibuprofen, tramadol and diclofenac every day. She is using excedrin and multiple other medications. Counseled her on Medication Overuse.  Had a long discussion, provided literature to read. Continue Ajovy.   Interval history 2018: She has migraines 4x a week. They can last 2 days. She stopped Nortriptyline and the headaches got worse. She has less than 10 headache free days, 10 migraine days a month. 4 days are severe migrainous and a few other days are migrainous but mild. Worse if she forgets the Nortriptyline.  Propranolol also helps esp with palpitations, she feels worse if she forgets. She is feeling very depressed, she cries in the office today. She feels lonely, she doesn't have any family here and it is hard to raise children here. She feels sad most days. She cries sometimes at home. No suicidal ideation. She has friends here, she has a close friend here. She has had these feelings for a long time. She has an appointment with her pcp next week.   Interval 05/20/2017:Patient returns today for follow-up of migraines on nortriptyline and propranolol. Had side effects to Topamax. We were unable to increase her nortriptyline due to side effects. She takes Phenergan and diclofenac for acute management.In one month she has 2-3 migraines and if she takes the diclofenac and phenergan during the aura she does not get the migraines and she sleeps well and she is fine. She is doing well on the Nortriptyline and  propranolol. She has tiredness to the nortriptyline, slowly titrate off. Every week go down a pill. Will stay on the propranolol. Will try Cambia instead of diclofenac.  Interval history 12/19/2016:Patient is here for follow-up of chronic migraines. She is on nortriptyline and propranolol. Topamax was tried in the past the patient had side  effects. Propranolol was increased at last appointment to 60 mg a day. We tried to increase the nortriptyline in the past but had joint pain. She had side effects to trip him. She takes Phenergan for headaches and diclofenac, I have discussed rebound headache and need to limit these. Relpax gave her chest pain and tightness. Amitriptyline made her too drowsy increased caffeine makes her headaches worse. Imitrex caused her neck and shoulder pain. Patient's headaches are described as throbbing, light sensitivity, sound sensitivity, neck and shoulder muscular pain, mostly on the right but can be on the left. She has been sleeping more and drinking caffeine which are triggers. She is fine today, no headache but has been suffering on and off for the last 3 weeks with migraines. They start after having coffee, would switch to decaf. And she has pain behind the eyes. She is having pain right now.   She was doing well for 4-5 months since last being seen. She started having a headache 3 weeks ago, she is having a lot of aura (does not always have an aura). 3 weeks ago had migraines all weekend. She has increased her Nortriptyline to 3 pills at night and propranolol to 20mg  tid. Headaches for the last 5 days as well.   Medications tried: Nortriptyline(on low dose could not increase doe),Amitriptyline(too sleepy),propranolol(on ER currently), Topamax, Relpax(chest pain and tightness), amitriptyline, Imitrex, Phenergananddiclofenacfor acute management.  Interval history 05/25/2016: She is on nortriptyline and propranolol and this has helped with the migraines. She is still having chronic migraines 4-5 in the last 2 weeks but she has been fasting and thinks dehydration is affecting. Before this the headaches were better she was having them 1-2x a month. For 3 months she had no headaches at all. Really just an increase in the last few weeks likely due to fasting. She takes cambia. She was sensitive to imitrex and  relpax. With the relpax her blood pressure went up and she had jaw pain. She went to the emergency room, tongue swelling. She tried to increase the Nortriptyline and had joint pain. She is on 20mg  mortriptyline. She had side effects to trokendi. She takes phenergan for the headaches. She takes diclofenac 4-5x a week no more (can cause rebound headache)  Interval history 11/28/2015; She has worsening headaches. She is crying today, in the last 3 weeks over 8 migraines. Relpax gave her side effects, chest pain and tightness. She started Amitriptyline, she didn't have a headache for a few months. She was having insomnia and amitrip helped a lot but made her too drowy. Sleep and increased caffeine makes her headaches worse. Yesterday she had a very bad migraine. She has had more than 8 migraines in the last 3 weeks.   HPI: Susan Ceciluha Carsten is a 49 y.o. female here as a referral from Dr. Jimmey RalphParker for Migraine  Migraines started 7 years ago. Used to have it 1-2x a year. Was worse with pregnancy. This last month she has pain on the right side of the head continuously. Throbbing. endorses Light sensitivity, sound sensitivity. Has wavy light aura. She has them 2x a month. This month she has had it a at a low level all  month, always there. She takes tylenol, ibuprofen daily. She has neck and shoulder muscular pain. Imitrex doesn't work, makes her shoulder sore, hands hurt. She has nausea and vomiting with the headaches. She went back to school this month, that may be exacerbating the headaches. She has a new glasses prescription. She has 4 kids. No more kids, has an IUD. She gets to sleep but everything wakes her up. She can't nap during the day. No snoring. Migraine can be severe. Can be 10/10. 8-10 hours.   REVIEW OF SYSTEMS: Out of a complete 14 system review of symptoms, the patient complains only of the following symptoms, headaches, sinus pain, congestion, allergy symptoms and all other reviewed systems are  negative.  ALLERGIES: Allergies  Allergen Reactions  . Relpax [Eletriptan] Shortness Of Breath    Throat swelling  . Oxycodone Itching    All over the body  . Phenergan [Promethazine Hcl]     Restless, anxious    HOME MEDICATIONS: Outpatient Medications Prior to Visit  Medication Sig Dispense Refill  . furosemide (LASIX) 20 MG tablet Take 20 mg by mouth daily.    . hydrochlorothiazide (HYDRODIURIL) 12.5 MG tablet Take 12.5 mg by mouth daily.    Marland Kitchen levothyroxine (SYNTHROID) 50 MCG tablet Take 50 mcg by mouth daily.    Marland Kitchen losartan (COZAAR) 100 MG tablet Take 100 mg by mouth daily.    . metFORMIN (GLUCOPHAGE) 1000 MG tablet Take 1,000 mg by mouth in the morning and at bedtime.    . ondansetron (ZOFRAN) 4 MG tablet Take 1 tablet (4 mg total) by mouth every 8 (eight) hours as needed for nausea or vomiting. 20 tablet 3  . tiZANidine (ZANAFLEX) 4 MG tablet TAKE 1 TABLET (4 MG TOTAL) BY MOUTH EVERY 6 (SIX) HOURS AS NEEDED FOR MUSCLE SPASMS. 30 tablet 11  . Vitamin D, Ergocalciferol, (DRISDOL) 50000 units CAPS capsule TAKE 1 CAPSULE BY MOUTH ONCE A WEEK. FOR 8 WEEKS 8 capsule 0  . propranolol ER (INDERAL LA) 60 MG 24 hr capsule Take 1 capsule (60 mg total) by mouth at bedtime. 30 capsule 0  . traMADol (ULTRAM) 50 MG tablet TAKE 1 TO 2 TABLETS EVERY 6 HOURS FOR PAIN 30 tablet 4  . Cholecalciferol 125 MCG (5000 UT) capsule Take 5,000 Units by mouth daily.    . diclofenac (VOLTAREN) 75 MG EC tablet TAKE ONE TABLET BY MOUTH ONCE DAILY AS NEEDED FOR ONSET OF HEADACHE. TAKE WITH FOOD (Patient not taking: Reported on 02/09/2020) 30 tablet 7  . hydrochlorothiazide (HYDRODIURIL) 25 MG tablet TAKE 1/2 TABLET (12.5 MG TOTAL) BY MOUTH DAILY. 45 tablet 6  . nortriptyline (PAMELOR) 25 MG capsule Take 1 capsule (25 mg total) by mouth at bedtime. (Patient not taking: Reported on 02/09/2020) 30 capsule 3   No facility-administered medications prior to visit.    PAST MEDICAL HISTORY: Past Medical History:    Diagnosis Date  . ADVANCED MATERNAL AGE 81/02/2008  . ANEMIA, IRON DEFICIENCY, UNSPEC. 02/12/2007   after IUD  . Constipation   . Contact dermatitis 08/27/2011  . Diabetes mellitus without complication (HCC)    gestational diab. currently & with infant #2  . DIABETES MELLITUS, GESTATIONAL, HX OF 05/18/2008  . Diabetes mellitus, type II (HCC)    Hbg A1C 6.9 after pregancy, now on Metformin  . Diarrhea   . 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 Gastric ulcer    history of  . GERD (gastroesophageal reflux disease)  doesn't take anything for acid reflux  . Gestational diabetes A2/B 07/20/2012   Fetal echo-nml-under media tab, needs baseline labs-nml with nml TSH and 24 hour urine-68mg . To see optho in Nov 2013-Per pt. Ok, serial u/s for growth, 2x/wk testing at 32 wks.   . Hemorrhoids   . 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 IUD complication ([facility]) 7/67/2094  . Migraine headache    last migraine in Sept 2012    PAST SURGICAL HISTORY: Past Surgical History:  Procedure Laterality Date  . CESAREAN SECTION N/A 01/22/2013   Procedure: CESAREAN SECTION;  Surgeon: Donnamae Jude, MD;  Location: [facility];  Service: Obstetrics;  Laterality: N/A;  Repeat  . CESAREAN SECTION  2005  . CESAREAN SECTION  2010  . CHOLECYSTECTOMY  11/28/2011   Procedure: LAPAROSCOPIC CHOLECYSTECTOMY WITH INTRAOPERATIVE CHOLANGIOGRAM;  Surgeon: Imogene Burn. Georgette Dover, MD;  Location: [facility];  Service: General;  Laterality: N/A;  . DILATION AND CURETTAGE OF UTERUS  2009  . female circumcision  73  . TONSILLECTOMY  1989    FAMILY HISTORY: Family History  Problem Relation Age of Onset  . Diabetes Mother   . Hypertension Mother   . Hyperlipidemia Mother   . Hypertension Father   . Hyperlipidemia Father   . Prostate cancer Father   . Diabetes Father   . Anesthesia problems Neg Hx   . Hypotension Neg Hx   . Malignant hyperthermia Neg Hx   . Pseudochol deficiency Neg Hx      SOCIAL HISTORY: Social History   Socioeconomic History  . Marital status: Married    Spouse name: Sheryle Hail  . Number of children: 4  . Years of education: Ba  . Highest education level: Not on file  Occupational History  . Occupation: [employer]  Tobacco Use  . Smoking status: Never Smoker  . Smokeless tobacco: Never Used  Substance and Sexual Activity  . Alcohol use: No  . Drug use: No  . Sexual activity: Yes    Birth control/protection: I.U.D.  Other Topics Concern  . Not on file  Social History Narrative   Lives at home with husband and 4 kids.   Right handed.    Caffeine use: Coffee (1-2 cups/day)   Social Determinants of Health   Financial Resource Strain:   . Difficulty of Paying Living Expenses: Not on file  Food Insecurity:   . Worried About Charity fundraiser in the Last Year: Not on file  . Ran Out of Food in the Last Year: Not on file  Transportation Needs:   . Lack of Transportation (Medical): Not on file  . Lack of Transportation (Non-Medical): Not on file  Physical Activity:   . Days of Exercise per Week: Not on file  . Minutes of Exercise per Session: Not on file  Stress:   . Feeling of Stress : Not on file  Social Connections:   . Frequency of Communication with Friends and Family: Not on file  . Frequency of Social Gatherings with Friends and Family: Not on file  . Attends Religious Services: Not on file  . Active Member of Clubs or Organizations: Not on file  . Attends Archivist Meetings: Not on file  . Marital Status: Not on file  Intimate Partner Violence:   . Fear of Current or Ex-Partner: Not on file  . Emotionally Abused: Not on file  . Physically Abused: Not on file  . Sexually Abused: Not on file      PHYSICAL EXAM  Vitals:  02/09/20 0922  BP: 128/76  Pulse: 87  Temp: 97.6 F (36.4 C)  TempSrc: Oral  Weight: 188 lb 9.6 oz (85.5 kg)  Height: 5\' 6"  (1.676 m)   Body mass index is 30.44 kg/m.  Generalized: Well  developed, in no acute distress  ENT: patient reports tenderness to palpation of bilateral frontal and maxillary sinuses Cardiology: normal rate and rhythm, no murmur noted Respiratory: clear to auscultation bilaterally  Neurological examination  Mentation: Alert oriented to time, place, history taking. Follows all commands speech and language fluent Cranial nerve II-XII: Pupils were equal round reactive to light. Extraocular movements were full, visual field were full on confrontational test. Facial sensation and strength were normal. Uvula tongue midline. Head turning and shoulder shrug  were normal and symmetric. Motor: The motor testing reveals 5 over 5 strength of all 4 extremities. Good symmetric motor tone is noted throughout.  Sensory: Sensory testing is intact to soft touch on all 4 extremities. No evidence of extinction is noted.  Coordination: Cerebellar testing reveals good finger-nose-finger and heel-to-shin bilaterally.  Gait and station: Gait is normal.    DIAGNOSTIC DATA (LABS, IMAGING, TESTING) - I reviewed patient records, labs, notes, testing and imaging myself where available.  No flowsheet data found.   Lab Results  Component Value Date   WBC 5.0 05/04/2018   HGB 10.2 (L) 05/04/2018   HCT 33.4 (L) 05/04/2018   MCV 70 (L) 05/04/2018   PLT 342 05/04/2018      Component Value Date/Time   NA 138 10/29/2016 1542   NA 140 03/09/2015 1356   K 3.7 10/29/2016 1542   CL 105 10/29/2016 1542   CO2 28 10/29/2016 1542   GLUCOSE 117 (H) 10/29/2016 1542   BUN 7 10/29/2016 1542   BUN 8 03/09/2015 1356   CREATININE 0.45 (L) 10/29/2016 1542   CALCIUM 8.8 10/29/2016 1542   PROT 6.9 09/25/2015 1246   PROT 7.0 03/09/2015 1356   ALBUMIN 4.2 09/25/2015 1246   ALBUMIN 4.2 03/09/2015 1356   AST 14 09/25/2015 1246   ALT 14 09/25/2015 1246   ALKPHOS 61 09/25/2015 1246   BILITOT 0.3 09/25/2015 1246   BILITOT 0.2 03/09/2015 1356   GFRNONAA >60 11/05/2015 2116   GFRAA >60  11/05/2015 2116   No results found for: CHOL, HDL, LDLCALC, LDLDIRECT, TRIG, CHOLHDL Lab Results  Component Value Date   HGBA1C 6.7 05/04/2018   Lab Results  Component Value Date   VITAMINB12 336 05/07/2016   Lab Results  Component Value Date   TSH 2.040 05/04/2018     ASSESSMENT AND PLAN 49 y.o. year old female  has a past medical history of ADVANCED MATERNAL AGE (05/18/2008), ANEMIA, IRON DEFICIENCY, UNSPEC. 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 here with     ICD-10-CM   1. Migraine without aura and without status migrainosus, not intractable  G43.009   2. Seasonal allergic rhinitis, unspecified trigger  J30.2     Margeart was doing very well with migraine management on propranolol 60 mg daily.  Due to chronic kidney disease diclofenac was discontinued.  She has used Ultram 50 mg as needed for abortive therapy.  Review of PMP aware reveals that last refill for 30 tablets was in June  2020.  I feel that she understands appropriateness of use a low 90s this medication.  We will continue propranolol daily and refill tramadol for 30 tablets.  She was advised to use abortive medication sparingly.  I feel that the headache she is suffering from this past month are likely related to seasonal allergies.  We have discussed concerns for possible sinus infection.  She denies fever, chills, body aches or unilateral pain.  I have educated her on treatment options.  She will take Mucinex extended release (plain, not multisymptom) as well as an antihistamine like  Zyrtec or Xyzal as directed.  She was given strict follow-up precautions to call primary care for any worsening.  Neuro exam intact today.  Recent headaches are not typical of her migraines.  She will follow-up with [place] in 1 year, sooner if needed.  She verbalizes understanding and agreement with this plan.   No orders of the defined types were placed in this encounter.    Meds ordered this encounter  Medications  . traMADol (ULTRAM) 50 MG tablet    Sig: TAKE 1 TO 2 TABLETS EVERY 6 HOURS FOR PAIN    Dispense:  30 tablet    Refill:  0    Order Specific Question:   Supervising Provider    Answer:   Anson Fret [id number]  . propranolol ER (INDERAL LA) 60 MG 24 hr capsule    Sig: Take 1 capsule (60 mg total) by mouth at bedtime.    Dispense:  90 capsule    Refill:  3    Must keep FU in Jan    Order Specific Question:   Supervising Provider    Answer:   Anson Fret [[id number]]      I spent 15 minutes with the patient. 50% of this time was spent counseling and educating patient on plan of care and medications.    Shawnie Dapper, FNP-C 02/09/2020, 10:13 AM [facility] [address], [address] [place], [place] [address] [phone]  Made any corrections needed, and agree with history, physical, neuro exam,assessment and plan as stated.     Naomie Dean, MD [facility]

## 2020-02-15 ENCOUNTER — Ambulatory Visit: Payer: BLUE CROSS/BLUE SHIELD | Attending: Internal Medicine

## 2020-02-15 DIAGNOSIS — Z20822 Contact with and (suspected) exposure to covid-19: Secondary | ICD-10-CM

## 2020-02-16 LAB — NOVEL CORONAVIRUS, NAA: SARS-CoV-2, NAA: NOT DETECTED

## 2020-02-21 ENCOUNTER — Ambulatory Visit: Payer: BLUE CROSS/BLUE SHIELD | Admitting: Neurology

## 2020-03-03 ENCOUNTER — Encounter (HOSPITAL_BASED_OUTPATIENT_CLINIC_OR_DEPARTMENT_OTHER): Payer: Self-pay | Admitting: *Deleted

## 2020-03-03 ENCOUNTER — Emergency Department (HOSPITAL_BASED_OUTPATIENT_CLINIC_OR_DEPARTMENT_OTHER)
Admission: EM | Admit: 2020-03-03 | Discharge: 2020-03-03 | Disposition: A | Discharge status: Home / Self Care | Payer: BLUE CROSS/BLUE SHIELD | Attending: Emergency Medicine | Admitting: Emergency Medicine

## 2020-03-03 ENCOUNTER — Other Ambulatory Visit: Payer: Self-pay

## 2020-03-03 ENCOUNTER — Emergency Department (HOSPITAL_BASED_OUTPATIENT_CLINIC_OR_DEPARTMENT_OTHER): Payer: BLUE CROSS/BLUE SHIELD

## 2020-03-03 DIAGNOSIS — Z7984 Long term (current) use of oral hypoglycemic drugs: Secondary | ICD-10-CM | POA: Diagnosis not present

## 2020-03-03 DIAGNOSIS — Z79899 Other long term (current) drug therapy: Secondary | ICD-10-CM | POA: Insufficient documentation

## 2020-03-03 DIAGNOSIS — R519 Headache, unspecified: Secondary | ICD-10-CM | POA: Insufficient documentation

## 2020-03-03 DIAGNOSIS — I10 Essential (primary) hypertension: Secondary | ICD-10-CM | POA: Insufficient documentation

## 2020-03-03 DIAGNOSIS — E119 Type 2 diabetes mellitus without complications: Secondary | ICD-10-CM | POA: Insufficient documentation

## 2020-03-03 MED ORDER — METOCLOPRAMIDE HCL 5 MG/ML IJ SOLN
10.0000 mg | Freq: Once | INTRAMUSCULAR | Status: AC
  Administered 2020-03-03: 10 mg via INTRAVENOUS
  Filled 2020-03-03: qty 2

## 2020-03-03 MED ORDER — MORPHINE SULFATE (PF) 4 MG/ML IV SOLN
4.0000 mg | Freq: Once | INTRAVENOUS | Status: AC
  Administered 2020-03-03: 4 mg via INTRAVENOUS
  Filled 2020-03-03: qty 1

## 2020-03-03 MED ORDER — SODIUM CHLORIDE 0.9 % IV BOLUS
1000.0000 mL | Freq: Once | INTRAVENOUS | Status: AC
  Administered 2020-03-03: 1000 mL via INTRAVENOUS

## 2020-03-03 NOTE — ED Triage Notes (Signed)
Headache today. She took Tramadol without relief.

## 2020-03-03 NOTE — Discharge Instructions (Signed)
Please follow-up with [facility] regarding today's encounter.  Continue take your pain medication, as needed.  Return to the ED or seek immediate medical attention she develop any new or worsening symptoms.

## 2020-03-03 NOTE — ED Provider Notes (Addendum)
[facility] EMERGENCY DEPARTMENT Provider Note   CSN: [id number] Arrival date & time: 03/03/20  1917     History Chief Complaint  Patient presents with  . Headache    Susan Hopkins is a 49 y.o. female with PMH significant for migraine headache who presents to the ED with complaints of headache that did not improve with tramadol.  She states that this headache began approximately 1 to 2 hours ago prior to my initial examination and she felt improved with 2 doses of her Ultram 50 mg prescribed by her neurologist.  She is followed by [facility] for her migraine disorder, but states that she typically has an aura prodrome prior to onset of her headache symptoms which she did not have today.  She also states that typically affects one side of her head, however today there is bilateral distribution.  She does endorse photophobia and nausea symptoms in addition to her headache.  She is accompanied by her husband.  She also takes propranolol 60 mg daily in an effort to reduce frequency of her migraine headaches.  She denies any fevers or chills, meningismus, chest pain or difficulty breathing, blurred vision, numbness or weakness, or other symptoms.  HPI     Past Medical History:  Diagnosis Date  . ADVANCED MATERNAL AGE 52/02/2008  . ANEMIA, IRON DEFICIENCY, UNSPEC. 02/12/2007   after IUD  . Constipation   . Contact dermatitis 08/27/2011  . Diabetes mellitus without complication (HCC)    gestational diab. currently & with infant #2  . DIABETES MELLITUS, GESTATIONAL, HX OF 05/18/2008  . Diabetes mellitus, type II (HCC)    Hbg A1C 6.9 after pregancy, now on Metformin  . Diarrhea   . 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 Gastric ulcer    history of  . GERD (gastroesophageal reflux disease)    doesn't take anything for acid reflux  . Gestational diabetes A2/B 07/20/2012   Fetal echo-nml-under media tab, needs  baseline labs-nml with nml TSH and 24 hour urine-68mg . To see optho in Nov 2013-Per pt. Ok, serial u/s for growth, 2x/wk testing at 32 wks.   . Hemorrhoids   . 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 IUD complication (HCC) 07/28/2015  . Migraine headache    last migraine in Sept 2012    Patient Active Problem List   Diagnosis Date Noted  . Acute pain of both shoulders 07/03/2018  . Tension headache 07/03/2018  . Flea bite of lower leg 07/03/2018  . Rash and nonspecific skin eruption 06/24/2018  . Bite, insect 06/15/2018  . Dry skin 01/14/2018  . Stress incontinence 09/03/2017  . Type 2 diabetes mellitus without complication, without long-term current use of insulin (HCC) 09/03/2017  . Seasonal allergic rhinitis 09/03/2017  . Perimenopausal 09/03/2017  . Vaginal discharge 08/10/2016  . Nasal pain 05/08/2016  . Fatigue 05/07/2016  . Vitamin D deficiency 01/05/2016  . Essential hypertension, benign 12/22/2015  . Venous (peripheral) insufficiency 11/05/2015  . Daily headache 03/09/2015  . Elevated liver enzymes 08/26/2012  . Mammographic breast lesion 07/30/2012  . Overweight (BMI 25.0-29.9) 10/15/2011  . Migraine 01/09/2011  . ANEMIA, IRON DEFICIENCY, UNSPEC. 02/12/2007  . GASTROESOPHAGEAL REFLUX, NO ESOPHAGITIS 02/12/2007    Past Surgical History:  Procedure Laterality Date  . CESAREAN SECTION N/A 01/22/2013   Procedure: CESAREAN SECTION;  Surgeon: Reva Bores, MD;  Location: [facility];  Service: Obstetrics;  Laterality: N/A;  Repeat  . CESAREAN SECTION  2005  .  CESAREAN SECTION  2010  . CHOLECYSTECTOMY  11/28/2011   Procedure: LAPAROSCOPIC CHOLECYSTECTOMY WITH INTRAOPERATIVE CHOLANGIOGRAM;  Surgeon: Wilmon Arms. Corliss Skains, MD;  Location: [facility] OR;  Service: General;  Laterality: N/A;  . DILATION AND CURETTAGE OF UTERUS  2009  . female circumcision  69  . TONSILLECTOMY  1989     OB History    Gravida  5   Para  4   Term  4   Preterm      AB  1   Living  3     SAB  1    TAB      Ectopic      Multiple      Live Births  3           Family History  Problem Relation Age of Onset  . Diabetes Mother   . Hypertension Mother   . Hyperlipidemia Mother   . Hypertension Father   . Hyperlipidemia Father   . Prostate cancer Father   . Diabetes Father   . Anesthesia problems Neg Hx   . Hypotension Neg Hx   . Malignant hyperthermia Neg Hx   . Pseudochol deficiency Neg Hx     Social History   Tobacco Use  . Smoking status: Never Smoker  . Smokeless tobacco: Never Used  Substance Use Topics  . Alcohol use: No  . Drug use: No    Home Medications Prior to Admission medications   Medication Sig Start Date End Date Taking? Authorizing Provider  furosemide (LASIX) 20 MG tablet Take 20 mg by mouth daily. 10/27/19   [provider]  hydrochlorothiazide (HYDRODIURIL) 12.5 MG tablet Take 12.5 mg by mouth daily. 01/26/20   [provider]  levothyroxine (SYNTHROID) 50 MCG tablet Take 50 mcg by mouth daily. 12/30/19   [provider]  losartan (COZAAR) 100 MG tablet Take 100 mg by mouth daily. 01/26/20   [provider]  metFORMIN (GLUCOPHAGE) 1000 MG tablet Take 1,000 mg by mouth in the morning and at bedtime. 12/30/19   [provider]  ondansetron (ZOFRAN) 4 MG tablet Take 1 tablet (4 mg total) by mouth every 8 (eight) hours as needed for nausea or vomiting. 04/06/18   Nilda Riggs, NP  propranolol ER (INDERAL LA) 60 MG 24 hr capsule Take 1 capsule (60 mg total) by mouth at bedtime. 02/09/20   Lomax, Amy, NP  tiZANidine (ZANAFLEX) 4 MG tablet TAKE 1 TABLET (4 MG TOTAL) BY MOUTH EVERY 6 (SIX) HOURS AS NEEDED FOR MUSCLE SPASMS. 08/06/19   Anson Fret, MD  traMADol (ULTRAM) 50 MG tablet TAKE 1 TO 2 TABLETS EVERY 6 HOURS FOR PAIN 02/09/20   Lomax, Amy, NP  Vitamin D, Ergocalciferol, (DRISDOL) 50000 units CAPS capsule TAKE 1 CAPSULE BY MOUTH ONCE A WEEK. FOR 8 WEEKS 07/17/18   Diallo, Lilia Argue, MD     Allergies    Relpax [eletriptan], Oxycodone, and Phenergan [promethazine hcl]  Review of Systems   Review of Systems  Constitutional: Negative for fever.  Eyes: Positive for photophobia.  Gastrointestinal: Positive for nausea. Negative for vomiting.  Neurological: Positive for headaches. Negative for dizziness.    Physical Exam Updated Vital Signs BP (!) 147/86 (BP Location: Right Arm)   Pulse 67   Temp 98.6 F (37 C) (Oral)   Resp 18   Ht 5\' 6"  (1.676 m)   Wt 84.4 kg   SpO2 99%   BMI 30.02 kg/m   Physical Exam Vitals and nursing note reviewed.  Exam conducted with a chaperone present.  Constitutional:      Appearance: Normal appearance.  HENT:     Head: Normocephalic and atraumatic.  Eyes:     General: No scleral icterus.    Extraocular Movements: Extraocular movements intact.     Conjunctiva/sclera: Conjunctivae normal.     Pupils: Pupils are equal, round, and reactive to light.     Comments: No nystagmus.  Neck:     Comments: No meningismus. Cardiovascular:     Rate and Rhythm: Normal rate and regular rhythm.     Pulses: Normal pulses.  Pulmonary:     Effort: Pulmonary effort is normal.  Musculoskeletal:     Cervical back: Normal range of motion and neck supple. No rigidity.  Skin:    General: Skin is dry.     Capillary Refill: Capillary refill takes less than 2 seconds.  Neurological:     Mental Status: She is alert.     GCS: GCS eye subscore is 4. GCS verbal subscore is 5. GCS motor subscore is 6.     Comments: CN II to XII grossly intact. Sensation intact throughout. Strength and ROM intact throughout. Normal gait. Negative Brudzinski test.  Psychiatric:        Mood and Affect: Mood normal.        Behavior: Behavior normal.        Thought Content: Thought content normal.     ED Results / Procedures / Treatments   Labs (all labs ordered are listed, but only abnormal results are displayed) Labs Reviewed - No data to  display  EKG None  Radiology CT Head Wo Contrast  Result Date: 03/03/2020 CLINICAL DATA:  Worsening headache, tramadol without relief EXAM: CT HEAD WITHOUT CONTRAST TECHNIQUE: Contiguous axial images were obtained from the base of the skull through the vertex without intravenous contrast. COMPARISON:  MRI 03/15/2015 (images only) FINDINGS: Brain: No evidence of acute infarction, hemorrhage, hydrocephalus, extra-axial collection or mass lesion/mass effect. Symmetric prominence of the ventricles, cisterns and sulci compatible with parenchymal volume loss, slightly age advanced. Patchy areas of white matter hypoattenuation are most compatible with chronic microvascular angiopathy. Vascular: Atherosclerotic calcification of the carotid siphons. No hyperdense vessel. Skull: No calvarial fracture or suspicious osseous lesion. No scalp swelling or hematoma. Sinuses/Orbits: Paranasal sinuses and mastoid air cells are predominantly clear. Pneumatization of the petrous apices. Included orbital structures are unremarkable. Other: None IMPRESSION: 1. No evidence of acute intracranial pathology. 2. Parenchymal volume loss, slightly age advanced with mild chronic white matter changes. Electronically Signed   By: Kreg Shropshire M.D.   On: 03/03/2020 21:01    Procedures Procedures (including critical care time)  Medications Ordered in ED Medications  sodium chloride 0.9 % bolus 1,000 mL (0 mLs Intravenous Stopped 03/03/20 2201)  morphine 4 MG/ML injection 4 mg (4 mg Intravenous Given 03/03/20 2016)  metoCLOPramide (REGLAN) injection 10 mg (10 mg Intravenous Given 03/03/20 2015)    ED Course  I have reviewed the triage vital signs and the nursing notes.  Pertinent labs & imaging results that were available during my care of the patient were reviewed by me and considered in my medical decision making (see chart for details).    MDM Rules/Calculators/A&P                      On reevaluation, patient is feeling  much improved after administration of metoclopramide, morphine, and IV NS. Obtained imaging due to the fact that patient states that  this was a headache with new character and different from her regular migraine headaches. Reviewed patient's head CT obtained without contrast which demonstrated no intracranial hemorrhage or other acute abnormalities. Patient is neurovascularly intact and without any focal deficits. No meningismus on physical exam.  Patient feels safe for discharge at this time. Will go home and turn off the lights and get some sleep. Instructed her to follow-up with her neurologist regarding today's encounter and for ongoing evaluation and management of her headache disorder.  Final Clinical Impression(s) / ED Diagnoses Final diagnoses:  Bad headache    Rx / DC Orders ED Discharge Orders    None       Corena Herter, PA-C 03/03/20 2154    Corena Herter, PA-C 03/03/20 2321    Dorie Rank, MD 03/04/20 1217

## 2020-03-04 ENCOUNTER — Encounter (HOSPITAL_BASED_OUTPATIENT_CLINIC_OR_DEPARTMENT_OTHER): Payer: Self-pay

## 2020-03-04 ENCOUNTER — Other Ambulatory Visit: Payer: Self-pay

## 2020-03-04 ENCOUNTER — Emergency Department (HOSPITAL_BASED_OUTPATIENT_CLINIC_OR_DEPARTMENT_OTHER)
Admission: EM | Admit: 2020-03-04 | Discharge: 2020-03-04 | Disposition: A | Discharge status: Home / Self Care | Payer: BLUE CROSS/BLUE SHIELD | Attending: Emergency Medicine | Admitting: Emergency Medicine

## 2020-03-04 DIAGNOSIS — Z79899 Other long term (current) drug therapy: Secondary | ICD-10-CM | POA: Insufficient documentation

## 2020-03-04 DIAGNOSIS — I10 Essential (primary) hypertension: Secondary | ICD-10-CM | POA: Diagnosis not present

## 2020-03-04 DIAGNOSIS — E119 Type 2 diabetes mellitus without complications: Secondary | ICD-10-CM | POA: Insufficient documentation

## 2020-03-04 DIAGNOSIS — Z7984 Long term (current) use of oral hypoglycemic drugs: Secondary | ICD-10-CM | POA: Insufficient documentation

## 2020-03-04 DIAGNOSIS — R519 Headache, unspecified: Secondary | ICD-10-CM | POA: Diagnosis not present

## 2020-03-04 DIAGNOSIS — G4700 Insomnia, unspecified: Secondary | ICD-10-CM

## 2020-03-04 DIAGNOSIS — R1110 Vomiting, unspecified: Secondary | ICD-10-CM | POA: Diagnosis present

## 2020-03-04 LAB — CBC WITH DIFFERENTIAL/PLATELET
Abs Immature Granulocytes: 0.02 10*3/uL (ref 0.00–0.07)
Basophils Absolute: 0 10*3/uL (ref 0.0–0.1)
Basophils Relative: 1 %
Eosinophils Absolute: 0.1 10*3/uL (ref 0.0–0.5)
Eosinophils Relative: 2 %
HCT: 35.8 % — ABNORMAL LOW (ref 36.0–46.0)
Hemoglobin: 10.9 g/dL — ABNORMAL LOW (ref 12.0–15.0)
Immature Granulocytes: 0 %
Lymphocytes Relative: 37 %
Lymphs Abs: 1.9 10*3/uL (ref 0.7–4.0)
MCH: 23.7 pg — ABNORMAL LOW (ref 26.0–34.0)
MCHC: 30.4 g/dL (ref 30.0–36.0)
MCV: 77.8 fL — ABNORMAL LOW (ref 80.0–100.0)
Monocytes Absolute: 0.3 10*3/uL (ref 0.1–1.0)
Monocytes Relative: 6 %
Neutro Abs: 2.8 10*3/uL (ref 1.7–7.7)
Neutrophils Relative %: 54 %
Platelets: 277 10*3/uL (ref 150–400)
RBC: 4.6 MIL/uL (ref 3.87–5.11)
RDW: 16 % — ABNORMAL HIGH (ref 11.5–15.5)
WBC: 5.2 10*3/uL (ref 4.0–10.5)
nRBC: 0 % (ref 0.0–0.2)

## 2020-03-04 LAB — BASIC METABOLIC PANEL
Anion gap: 10 (ref 5–15)
BUN: 8 mg/dL (ref 6–20)
CO2: 23 mmol/L (ref 22–32)
Calcium: 8.2 mg/dL — ABNORMAL LOW (ref 8.9–10.3)
Chloride: 103 mmol/L (ref 98–111)
Creatinine, Ser: 0.49 mg/dL (ref 0.44–1.00)
GFR calc Af Amer: >60 mL/min (ref >60)
GFR calc non Af Amer: >60 mL/min (ref >60)
Glucose, Bld: 149 mg/dL — ABNORMAL HIGH (ref 70–99)
Potassium: 3.1 mmol/L — ABNORMAL LOW (ref 3.5–5.1)
Sodium: 136 mmol/L (ref 135–145)

## 2020-03-04 MED ORDER — SODIUM CHLORIDE 0.9 % IV BOLUS
1000.0000 mL | Freq: Once | INTRAVENOUS | Status: AC
  Administered 2020-03-04: 1000 mL via INTRAVENOUS

## 2020-03-04 MED ORDER — KETOROLAC TROMETHAMINE 15 MG/ML IJ SOLN
15.0000 mg | Freq: Once | INTRAMUSCULAR | Status: AC
  Administered 2020-03-04: 15 mg via INTRAVENOUS
  Filled 2020-03-04: qty 1

## 2020-03-04 MED ORDER — MORPHINE SULFATE (PF) 4 MG/ML IV SOLN
4.0000 mg | Freq: Once | INTRAVENOUS | Status: AC
  Administered 2020-03-04: 4 mg via INTRAVENOUS
  Filled 2020-03-04: qty 1

## 2020-03-04 MED ORDER — SODIUM CHLORIDE 0.9 % IV SOLN: INTRAVENOUS | Status: DC

## 2020-03-04 MED ORDER — DIPHENHYDRAMINE HCL 50 MG/ML IJ SOLN
12.5000 mg | Freq: Once | INTRAMUSCULAR | Status: AC
  Administered 2020-03-04: 12.5 mg via INTRAVENOUS
  Filled 2020-03-04: qty 1

## 2020-03-04 MED ORDER — METOCLOPRAMIDE HCL 5 MG/ML IJ SOLN
10.0000 mg | Freq: Once | INTRAMUSCULAR | Status: AC
  Administered 2020-03-04: 10 mg via INTRAVENOUS
  Filled 2020-03-04: qty 2

## 2020-03-04 MED ORDER — ZOLPIDEM TARTRATE 5 MG PO TABS: 5.0000 mg | ORAL_TABLET | Freq: Every evening | ORAL | 0 refills | Status: AC | PRN

## 2020-03-04 NOTE — ED Notes (Signed)
Pt reported new onset chest pain, EDP notified, no new orders, pt placed on monitor

## 2020-03-04 NOTE — ED Notes (Signed)
Pt reported to EDP that chest pain is gone, monitor still on

## 2020-03-04 NOTE — ED Provider Notes (Signed)
[facility] EMERGENCY DEPARTMENT Provider Note   CSN: [id number] Arrival date & time: 03/04/20  1837     History Chief Complaint  Patient presents with  . Emesis  . Headache    Susan Hopkins is a 49 y.o. female.  Pt presents to the ED today with a headache.  The pt said she has a hx of migraines, but this does not feel like her typical migraine.  She was here yesterday for the same.  She had a head CT which was nl.  She felt better and was discharged.  Headache came back today.  She said the losartan pill she has recently refilled looked different and she was worried that was causing her sx.  She denies any f/c.  + n/v.        Past Medical History:  Diagnosis Date  . ADVANCED MATERNAL AGE 17/02/2008  . ANEMIA, IRON DEFICIENCY, UNSPEC. 02/12/2007   after IUD  . Constipation   . Contact dermatitis 08/27/2011  . Diabetes mellitus without complication (HCC)    gestational diab. currently & with infant #2  . DIABETES MELLITUS, GESTATIONAL, HX OF 05/18/2008  . Diabetes mellitus, type II (HCC)    Hbg A1C 6.9 after pregancy, now on Metformin  . Diarrhea   . 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 Gastric ulcer    history of  . GERD (gastroesophageal reflux disease)    doesn't take anything for acid reflux  . Gestational diabetes A2/B 07/20/2012   Fetal echo-nml-under media tab, needs baseline labs-nml with nml TSH and 24 hour urine-68mg . To see optho in Nov 2013-Per pt. Ok, serial u/s for growth, 2x/wk testing at 32 wks.   . Hemorrhoids   . 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 IUD complication (HCC) 07/28/2015  . Migraine headache    last migraine in Sept 2012    Patient Active Problem List   Diagnosis Date Noted  . Acute pain of both shoulders 07/03/2018  . Tension headache 07/03/2018  . Flea bite of lower leg 07/03/2018  . Rash and nonspecific skin eruption 06/24/2018  . Bite, insect 06/15/2018  . Dry skin  01/14/2018  . Stress incontinence 09/03/2017  . Type 2 diabetes mellitus without complication, without long-term current use of insulin (HCC) 09/03/2017  . Seasonal allergic rhinitis 09/03/2017  . Perimenopausal 09/03/2017  . Vaginal discharge 08/10/2016  . Nasal pain 05/08/2016  . Fatigue 05/07/2016  . Vitamin D deficiency 01/05/2016  . Essential hypertension, benign 12/22/2015  . Venous (peripheral) insufficiency 11/05/2015  . Daily headache 03/09/2015  . Elevated liver enzymes 08/26/2012  . Mammographic breast lesion 07/30/2012  . Overweight (BMI 25.0-29.9) 10/15/2011  . Migraine 01/09/2011  . ANEMIA, IRON DEFICIENCY, UNSPEC. 02/12/2007  . GASTROESOPHAGEAL REFLUX, NO ESOPHAGITIS 02/12/2007    Past Surgical History:  Procedure Laterality Date  . CESAREAN SECTION N/A 01/22/2013   Procedure: CESAREAN SECTION;  Surgeon: Reva Bores, MD;  Location: [facility] ORS;  Service: Obstetrics;  Laterality: N/A;  Repeat  . CESAREAN SECTION  2005  . CESAREAN SECTION  2010  . CHOLECYSTECTOMY  11/28/2011   Procedure: LAPAROSCOPIC CHOLECYSTECTOMY WITH INTRAOPERATIVE CHOLANGIOGRAM;  Surgeon: Wilmon Arms. Corliss Skains, MD;  Location: [facility] OR;  Service: General;  Laterality: N/A;  . DILATION AND CURETTAGE OF UTERUS  2009  . female circumcision  65  . TONSILLECTOMY  1989     OB History    Gravida  5   Para  4   Term  4  Preterm      AB  1   Living  3     SAB  1   TAB      Ectopic      Multiple      Live Births  3           Family History  Problem Relation Age of Onset  . Diabetes Mother   . Hypertension Mother   . Hyperlipidemia Mother   . Hypertension Father   . Hyperlipidemia Father   . Prostate cancer Father   . Diabetes Father   . Anesthesia problems Neg Hx   . Hypotension Neg Hx   . Malignant hyperthermia Neg Hx   . Pseudochol deficiency Neg Hx     Social History   Tobacco Use  . Smoking status: Never Smoker  . Smokeless tobacco: Never Used  Substance Use Topics    . Alcohol use: No  . Drug use: No    Home Medications Prior to Admission medications   Medication Sig Start Date End Date Taking? Authorizing Provider  furosemide (LASIX) 20 MG tablet Take 20 mg by mouth daily. 10/27/19   [provider]  hydrochlorothiazide (HYDRODIURIL) 12.5 MG tablet Take 12.5 mg by mouth daily. 01/26/20   [provider]  levothyroxine (SYNTHROID) 50 MCG tablet Take 50 mcg by mouth daily. 12/30/19   [provider]  losartan (COZAAR) 100 MG tablet Take 100 mg by mouth daily. 01/26/20   [provider]  metFORMIN (GLUCOPHAGE) 1000 MG tablet Take 1,000 mg by mouth in the morning and at bedtime. 12/30/19   [provider]  ondansetron (ZOFRAN) 4 MG tablet Take 1 tablet (4 mg total) by mouth every 8 (eight) hours as needed for nausea or vomiting. 04/06/18   Dennie Bible, NP  propranolol ER (INDERAL LA) 60 MG 24 hr capsule Take 1 capsule (60 mg total) by mouth at bedtime. 02/09/20   Lomax, Amy, NP  tiZANidine (ZANAFLEX) 4 MG tablet TAKE 1 TABLET (4 MG TOTAL) BY MOUTH EVERY 6 (SIX) HOURS AS NEEDED FOR MUSCLE SPASMS. 08/06/19   Melvenia Beam, MD  traMADol (ULTRAM) 50 MG tablet TAKE 1 TO 2 TABLETS EVERY 6 HOURS FOR PAIN 02/09/20   Lomax, Amy, NP  Vitamin D, Ergocalciferol, (DRISDOL) 50000 units CAPS capsule TAKE 1 CAPSULE BY MOUTH ONCE A WEEK. FOR 8 WEEKS 07/17/18   Diallo, Earna Coder, MD  zolpidem (AMBIEN) 5 MG tablet Take 1 tablet (5 mg total) by mouth at bedtime as needed for sleep. 03/04/20   Isla Pence, MD    Allergies    Relpax [eletriptan], Oxycodone, and Phenergan [promethazine hcl]  Review of Systems   Review of Systems  Gastrointestinal: Positive for nausea.  Neurological: Positive for headaches.  All other systems reviewed and are negative.   Physical Exam Updated Vital Signs BP (!) 167/82   Pulse 81   Temp 98.1 F (36.7 C)   Resp 14   Ht 5\' 6"  (1.676 m)   Wt 84.4 kg   SpO2 98%   BMI 30.02 kg/m    Physical Exam Vitals and nursing note reviewed.  Constitutional:      Appearance: She is well-developed.  HENT:     Head: Normocephalic and atraumatic.     Mouth/Throat:     Mouth: Mucous membranes are moist.     Pharynx: Oropharynx is clear.  Eyes:     Extraocular Movements: Extraocular movements intact.     Pupils: Pupils are equal, round, and  reactive to light.  Cardiovascular:     Rate and Rhythm: Normal rate and regular rhythm.  Pulmonary:     Effort: Pulmonary effort is normal.     Breath sounds: Normal breath sounds.  Abdominal:     General: Bowel sounds are normal.     Palpations: Abdomen is soft.  Musculoskeletal:        General: Normal range of motion.     Cervical back: Normal range of motion and neck supple.  Skin:    General: Skin is warm.     Capillary Refill: Capillary refill takes less than 2 seconds.  Neurological:     Mental Status: She is alert and oriented to person, place, and time.  Psychiatric:        Mood and Affect: Mood normal.        Speech: Speech normal.        Behavior: Behavior normal.     ED Results / Procedures / Treatments   Labs (all labs ordered are listed, but only abnormal results are displayed) Labs Reviewed  BASIC METABOLIC PANEL - Abnormal; Notable for the following components:      Result Value   Potassium 3.1 (*)    Glucose, Bld 149 (*)    Calcium 8.2 (*)    All other components within normal limits  CBC WITH DIFFERENTIAL/PLATELET - Abnormal; Notable for the following components:   Hemoglobin 10.9 (*)    HCT 35.8 (*)    MCV 77.8 (*)    MCH 23.7 (*)    RDW 16.0 (*)    All other components within normal limits    EKG None  Radiology CT Head Wo Contrast  Result Date: 03/03/2020 CLINICAL DATA:  Worsening headache, tramadol without relief EXAM: CT HEAD WITHOUT CONTRAST TECHNIQUE: Contiguous axial images were obtained from the base of the skull through the vertex without intravenous contrast. COMPARISON:  MRI  03/15/2015 (images only) FINDINGS: Brain: No evidence of acute infarction, hemorrhage, hydrocephalus, extra-axial collection or mass lesion/mass effect. Symmetric prominence of the ventricles, cisterns and sulci compatible with parenchymal volume loss, slightly age advanced. Patchy areas of white matter hypoattenuation are most compatible with chronic microvascular angiopathy. Vascular: Atherosclerotic calcification of the carotid siphons. No hyperdense vessel. Skull: No calvarial fracture or suspicious osseous lesion. No scalp swelling or hematoma. Sinuses/Orbits: Paranasal sinuses and mastoid air cells are predominantly clear. Pneumatization of the petrous apices. Included orbital structures are unremarkable. Other: None IMPRESSION: 1. No evidence of acute intracranial pathology. 2. Parenchymal volume loss, slightly age advanced with mild chronic white matter changes. Electronically Signed   By: Kreg Shropshire M.D.   On: 03/03/2020 21:01    Procedures Procedures (including critical care time)  Medications Ordered in ED Medications  sodium chloride 0.9 % bolus 1,000 mL (0 mLs Intravenous Stopped 03/04/20 2059)    And  0.9 %  sodium chloride infusion ( Intravenous New Bag/Given 03/04/20 2041)  metoCLOPramide (REGLAN) injection 10 mg (10 mg Intravenous Given 03/04/20 1915)  diphenhydrAMINE (BENADRYL) injection 12.5 mg (12.5 mg Intravenous Given 03/04/20 1915)  ketorolac (TORADOL) 15 MG/ML injection 15 mg (15 mg Intravenous Given 03/04/20 1915)  morphine 4 MG/ML injection 4 mg (4 mg Intravenous Given 03/04/20 2024)    ED Course  I have reviewed the triage vital signs and the nursing notes.  Pertinent labs & imaging results that were available during my care of the patient were reviewed by me and considered in my medical decision making (see chart for details).  MDM Rules/Calculators/A&P                      Pt also said she's not been sleeping well and that her sinuses have been bothering her.  I  will give her some ambien for the sleep.   She has flonase and zyrtec and is encouraged to use those.  Pt is feeling much better and feels she is ready to go home.  She knows to return if worse.  F/u with her neurologist.   Final Clinical Impression(s) / ED Diagnoses Final diagnoses:  Acute nonintractable headache, unspecified headache type  Insomnia, unspecified type    Rx / DC Orders ED Discharge Orders         Ordered    zolpidem (AMBIEN) 5 MG tablet  At bedtime PRN     03/04/20 2134           Jacalyn Lefevre, MD 03/04/20 2135

## 2020-03-04 NOTE — ED Triage Notes (Signed)
Pt arrives via [facility] from home with c/o headache and vomiting r/t pain. Pt was seen here yesterday for same

## 2020-03-07 ENCOUNTER — Other Ambulatory Visit: Payer: Self-pay

## 2020-03-07 ENCOUNTER — Inpatient Hospital Stay (HOSPITAL_BASED_OUTPATIENT_CLINIC_OR_DEPARTMENT_OTHER)
Admission: EM | Admit: 2020-03-07 | Discharge: 2020-03-10 | DRG: 304 | Disposition: A | Discharge status: Home / Self Care | Payer: BLUE CROSS/BLUE SHIELD | Attending: Internal Medicine | Admitting: Internal Medicine

## 2020-03-07 ENCOUNTER — Emergency Department (HOSPITAL_BASED_OUTPATIENT_CLINIC_OR_DEPARTMENT_OTHER): Payer: BLUE CROSS/BLUE SHIELD

## 2020-03-07 ENCOUNTER — Emergency Department (HOSPITAL_COMMUNITY): Payer: BLUE CROSS/BLUE SHIELD

## 2020-03-07 ENCOUNTER — Encounter (HOSPITAL_BASED_OUTPATIENT_CLINIC_OR_DEPARTMENT_OTHER): Payer: Self-pay | Admitting: Emergency Medicine

## 2020-03-07 DIAGNOSIS — Z20822 Contact with and (suspected) exposure to covid-19: Secondary | ICD-10-CM | POA: Diagnosis present

## 2020-03-07 DIAGNOSIS — N049 Nephrotic syndrome with unspecified morphologic changes: Secondary | ICD-10-CM | POA: Diagnosis not present

## 2020-03-07 DIAGNOSIS — I361 Nonrheumatic tricuspid (valve) insufficiency: Secondary | ICD-10-CM | POA: Diagnosis not present

## 2020-03-07 DIAGNOSIS — Z7984 Long term (current) use of oral hypoglycemic drugs: Secondary | ICD-10-CM

## 2020-03-07 DIAGNOSIS — E1121 Type 2 diabetes mellitus with diabetic nephropathy: Secondary | ICD-10-CM

## 2020-03-07 DIAGNOSIS — E872 Acidosis: Secondary | ICD-10-CM | POA: Diagnosis present

## 2020-03-07 DIAGNOSIS — Z8711 Personal history of peptic ulcer disease: Secondary | ICD-10-CM

## 2020-03-07 DIAGNOSIS — Z79899 Other long term (current) drug therapy: Secondary | ICD-10-CM

## 2020-03-07 DIAGNOSIS — I2699 Other pulmonary embolism without acute cor pulmonale: Secondary | ICD-10-CM | POA: Diagnosis present

## 2020-03-07 DIAGNOSIS — G43909 Migraine, unspecified, not intractable, without status migrainosus: Secondary | ICD-10-CM | POA: Diagnosis present

## 2020-03-07 DIAGNOSIS — I10 Essential (primary) hypertension: Secondary | ICD-10-CM

## 2020-03-07 DIAGNOSIS — Z79891 Long term (current) use of opiate analgesic: Secondary | ICD-10-CM

## 2020-03-07 DIAGNOSIS — Z885 Allergy status to narcotic agent status: Secondary | ICD-10-CM | POA: Diagnosis not present

## 2020-03-07 DIAGNOSIS — Z833 Family history of diabetes mellitus: Secondary | ICD-10-CM

## 2020-03-07 DIAGNOSIS — I161 Hypertensive emergency: Principal | ICD-10-CM | POA: Diagnosis present

## 2020-03-07 DIAGNOSIS — I351 Nonrheumatic aortic (valve) insufficiency: Secondary | ICD-10-CM | POA: Diagnosis not present

## 2020-03-07 DIAGNOSIS — I129 Hypertensive chronic kidney disease with stage 1 through stage 4 chronic kidney disease, or unspecified chronic kidney disease: Secondary | ICD-10-CM | POA: Diagnosis present

## 2020-03-07 DIAGNOSIS — I6783 Posterior reversible encephalopathy syndrome: Secondary | ICD-10-CM | POA: Diagnosis present

## 2020-03-07 DIAGNOSIS — R569 Unspecified convulsions: Secondary | ICD-10-CM

## 2020-03-07 DIAGNOSIS — Z888 Allergy status to other drugs, medicaments and biological substances status: Secondary | ICD-10-CM

## 2020-03-07 DIAGNOSIS — D509 Iron deficiency anemia, unspecified: Secondary | ICD-10-CM | POA: Diagnosis present

## 2020-03-07 DIAGNOSIS — Z79890 Hormone replacement therapy (postmenopausal): Secondary | ICD-10-CM | POA: Diagnosis not present

## 2020-03-07 DIAGNOSIS — R519 Headache, unspecified: Secondary | ICD-10-CM | POA: Diagnosis not present

## 2020-03-07 DIAGNOSIS — N189 Chronic kidney disease, unspecified: Secondary | ICD-10-CM | POA: Diagnosis present

## 2020-03-07 DIAGNOSIS — E876 Hypokalemia: Secondary | ICD-10-CM

## 2020-03-07 DIAGNOSIS — E1122 Type 2 diabetes mellitus with diabetic chronic kidney disease: Secondary | ICD-10-CM | POA: Diagnosis not present

## 2020-03-07 DIAGNOSIS — Z8249 Family history of ischemic heart disease and other diseases of the circulatory system: Secondary | ICD-10-CM

## 2020-03-07 DIAGNOSIS — Z8349 Family history of other endocrine, nutritional and metabolic diseases: Secondary | ICD-10-CM

## 2020-03-07 DIAGNOSIS — G40901 Epilepsy, unspecified, not intractable, with status epilepticus: Secondary | ICD-10-CM | POA: Diagnosis present

## 2020-03-07 LAB — COMPREHENSIVE METABOLIC PANEL
ALT: 28 U/L (ref 0–44)
ALT: 19 U/L (ref 0–44)
AST: 22 U/L (ref 15–41)
AST: 17 U/L (ref 15–41)
Albumin: 2.8 g/dL — ABNORMAL LOW (ref 3.5–5.0)
Albumin: 2 g/dL — ABNORMAL LOW (ref 3.5–5.0)
Alkaline Phosphatase: 60 U/L (ref 38–126)
Alkaline Phosphatase: 51 U/L (ref 38–126)
Anion gap: 19 — ABNORMAL HIGH (ref 5–15)
Anion gap: 10 (ref 5–15)
BUN: 9 mg/dL (ref 6–20)
BUN: <5 mg/dL — ABNORMAL LOW (ref 6–20)
CO2: 18 mmol/L — ABNORMAL LOW (ref 22–32)
CO2: 23 mmol/L (ref 22–32)
Calcium: 8.8 mg/dL — ABNORMAL LOW (ref 8.9–10.3)
Calcium: 7.6 mg/dL — ABNORMAL LOW (ref 8.9–10.3)
Chloride: 101 mmol/L (ref 98–111)
Chloride: 104 mmol/L (ref 98–111)
Creatinine, Ser: 0.54 mg/dL (ref 0.44–1.00)
Creatinine, Ser: 0.5 mg/dL (ref 0.44–1.00)
GFR calc Af Amer: >60 mL/min (ref >60)
GFR calc Af Amer: >60 mL/min (ref >60)
GFR calc non Af Amer: >60 mL/min (ref >60)
GFR calc non Af Amer: >60 mL/min (ref >60)
Glucose, Bld: 193 mg/dL — ABNORMAL HIGH (ref 70–99)
Glucose, Bld: 108 mg/dL — ABNORMAL HIGH (ref 70–99)
Potassium: 3 mmol/L — ABNORMAL LOW (ref 3.5–5.1)
Potassium: 2.5 mmol/L — CL (ref 3.5–5.1)
Sodium: 138 mmol/L (ref 135–145)
Sodium: 137 mmol/L (ref 135–145)
Total Bilirubin: 0.9 mg/dL (ref 0.3–1.2)
Total Bilirubin: 0.4 mg/dL (ref 0.3–1.2)
Total Protein: 6.8 g/dL (ref 6.5–8.1)
Total Protein: 5.4 g/dL — ABNORMAL LOW (ref 6.5–8.1)

## 2020-03-07 LAB — URINALYSIS, ROUTINE W REFLEX MICROSCOPIC
Bacteria, UA: NONE SEEN
Bilirubin Urine: NEGATIVE
Glucose, UA: 50 mg/dL — AB
Ketones, ur: NEGATIVE mg/dL
Leukocytes,Ua: NEGATIVE
Nitrite: NEGATIVE
Protein, ur: >=300 mg/dL — AB
Specific Gravity, Urine: 1.025 (ref 1.005–1.030)
pH: 6 (ref 5.0–8.0)

## 2020-03-07 LAB — LACTIC ACID, PLASMA
Lactic Acid, Venous: 8.9 mmol/L (ref 0.5–1.9)
Lactic Acid, Venous: 9.2 mmol/L (ref 0.5–1.9)
Lactic Acid, Venous: 1.3 mmol/L (ref 0.5–1.9)

## 2020-03-07 LAB — CBC
HCT: 42.4 % (ref 36.0–46.0)
Hemoglobin: 12.4 g/dL (ref 12.0–15.0)
MCH: 23.7 pg — ABNORMAL LOW (ref 26.0–34.0)
MCHC: 29.2 g/dL — ABNORMAL LOW (ref 30.0–36.0)
MCV: 80.9 fL (ref 80.0–100.0)
Platelets: 408 10*3/uL — ABNORMAL HIGH (ref 150–400)
RBC: 5.24 MIL/uL — ABNORMAL HIGH (ref 3.87–5.11)
RDW: 16.1 % — ABNORMAL HIGH (ref 11.5–15.5)
WBC: 6.1 10*3/uL (ref 4.0–10.5)
nRBC: 0 % (ref 0.0–0.2)

## 2020-03-07 LAB — SALICYLATE LEVEL: Salicylate Lvl: <7 mg/dL — ABNORMAL LOW (ref 7.0–30.0)

## 2020-03-07 LAB — APTT: aPTT: 29 seconds (ref 24–36)

## 2020-03-07 LAB — MAGNESIUM: Magnesium: 1.6 mg/dL — ABNORMAL LOW (ref 1.7–2.4)

## 2020-03-07 LAB — CBG MONITORING, ED
Glucose-Capillary: 186 mg/dL — ABNORMAL HIGH (ref 70–99)
Glucose-Capillary: 99 mg/dL (ref 70–99)

## 2020-03-07 LAB — ACETAMINOPHEN LEVEL: Acetaminophen (Tylenol), Serum: <10 ug/mL — ABNORMAL LOW (ref 10–30)

## 2020-03-07 LAB — ETHANOL: Alcohol, Ethyl (B): <10 mg/dL (ref <10)

## 2020-03-07 LAB — PHOSPHORUS: Phosphorus: 4 mg/dL (ref 2.5–4.6)

## 2020-03-07 LAB — HCG, QUANTITATIVE, PREGNANCY: hCG, Beta Chain, Quant, S: 5 m[IU]/mL — ABNORMAL HIGH (ref <5)

## 2020-03-07 LAB — HEPARIN LEVEL (UNFRACTIONATED): Heparin Unfractionated: <0.1 IU/mL — ABNORMAL LOW (ref 0.30–0.70)

## 2020-03-07 LAB — PROTIME-INR
INR: 1 (ref 0.8–1.2)
Prothrombin Time: 13.3 seconds (ref 11.4–15.2)

## 2020-03-07 LAB — CK: Total CK: 93 U/L (ref 38–234)

## 2020-03-07 MED ORDER — LORAZEPAM 2 MG/ML IJ SOLN
INTRAMUSCULAR | Status: AC
  Filled 2020-03-07: qty 1

## 2020-03-07 MED ORDER — HEPARIN (PORCINE) 25000 UT/250ML-% IV SOLN
1150.0000 [IU]/h | INTRAVENOUS | Status: DC
  Filled 2020-03-07: qty 250

## 2020-03-07 MED ORDER — LORAZEPAM 2 MG/ML IJ SOLN
4.0000 mg | Freq: Once | INTRAMUSCULAR | Status: AC
  Administered 2020-03-07: 2 mg via INTRAVENOUS

## 2020-03-07 MED ORDER — ASPIRIN 300 MG RE SUPP
300.0000 mg | Freq: Once | RECTAL | Status: AC
  Administered 2020-03-07: 300 mg via RECTAL

## 2020-03-07 MED ORDER — DIPHENHYDRAMINE HCL 50 MG/ML IJ SOLN
25.0000 mg | Freq: Once | INTRAMUSCULAR | Status: AC
  Administered 2020-03-07: 25 mg via INTRAVENOUS
  Filled 2020-03-07: qty 1

## 2020-03-07 MED ORDER — LEVETIRACETAM IN NACL 1000 MG/100ML IV SOLN
INTRAVENOUS | Status: AC
  Administered 2020-03-07: 1000 mg
  Filled 2020-03-07: qty 100

## 2020-03-07 MED ORDER — LORAZEPAM 2 MG/ML IJ SOLN
INTRAMUSCULAR | Status: AC
  Filled 2020-03-07: qty 2

## 2020-03-07 MED ORDER — HEPARIN (PORCINE) 25000 UT/250ML-% IV SOLN
1350.0000 [IU]/h | INTRAVENOUS | Status: DC
  Administered 2020-03-07: 1150 [IU]/h via INTRAVENOUS
  Administered 2020-03-08 – 2020-03-10 (×3): 1350 [IU]/h via INTRAVENOUS
  Filled 2020-03-07 (×3): qty 250

## 2020-03-07 MED ORDER — SODIUM CHLORIDE 0.9 % IV BOLUS
1000.0000 mL | Freq: Once | INTRAVENOUS | Status: AC
  Administered 2020-03-07: 1000 mL via INTRAVENOUS

## 2020-03-07 MED ORDER — POTASSIUM CHLORIDE IN NACL 40-0.9 MEQ/L-% IV SOLN
INTRAVENOUS | Status: DC
  Filled 2020-03-07: qty 1000

## 2020-03-07 MED ORDER — LACTATED RINGERS IV BOLUS
1000.0000 mL | Freq: Once | INTRAVENOUS | Status: AC
  Administered 2020-03-07: 1000 mL via INTRAVENOUS

## 2020-03-07 MED ORDER — HYDRALAZINE HCL 20 MG/ML IJ SOLN
2.0000 mg | INTRAMUSCULAR | Status: DC | PRN
  Administered 2020-03-07 – 2020-03-09 (×5): 2 mg via INTRAVENOUS
  Filled 2020-03-07 (×5): qty 1

## 2020-03-07 MED ORDER — SODIUM CHLORIDE 0.9 % IV SOLN
4500.0000 mg | Freq: Once | INTRAVENOUS | Status: DC
  Filled 2020-03-07: qty 45

## 2020-03-07 MED ORDER — LORAZEPAM 2 MG/ML IJ SOLN
2.0000 mg | Freq: Once | INTRAMUSCULAR | Status: AC
  Administered 2020-03-07: 2 mg via INTRAVENOUS

## 2020-03-07 MED ORDER — MAGNESIUM SULFATE 2 GM/50ML IV SOLN
2.0000 g | Freq: Once | INTRAVENOUS | Status: AC
  Administered 2020-03-08: 2 g via INTRAVENOUS
  Filled 2020-03-07: qty 50

## 2020-03-07 MED ORDER — IOHEXOL 350 MG/ML SOLN
100.0000 mL | Freq: Once | INTRAVENOUS | Status: AC | PRN
  Administered 2020-03-07: 80 mL via INTRAVENOUS

## 2020-03-07 MED ORDER — LEVETIRACETAM IN NACL 500 MG/100ML IV SOLN
500.0000 mg | Freq: Two times a day (BID) | INTRAVENOUS | Status: DC
  Administered 2020-03-07: 500 mg via INTRAVENOUS
  Filled 2020-03-07 (×3): qty 100

## 2020-03-07 MED ORDER — LEVETIRACETAM IN NACL 500 MG/100ML IV SOLN
INTRAVENOUS | Status: AC
  Administered 2020-03-07: 2000 mg
  Filled 2020-03-07: qty 100

## 2020-03-07 MED ORDER — LABETALOL HCL 5 MG/ML IV SOLN
20.0000 mg | Freq: Once | INTRAVENOUS | Status: AC
  Administered 2020-03-07: 20 mg via INTRAVENOUS
  Filled 2020-03-07: qty 4

## 2020-03-07 MED ORDER — LABETALOL HCL 5 MG/ML IV SOLN
5.0000 mg | INTRAVENOUS | Status: DC | PRN
  Administered 2020-03-08 – 2020-03-09 (×4): 5 mg via INTRAVENOUS
  Filled 2020-03-07 (×5): qty 4

## 2020-03-07 MED ORDER — LACTATED RINGERS IV SOLN: INTRAVENOUS | Status: DC

## 2020-03-07 MED ORDER — ASPIRIN 300 MG RE SUPP
RECTAL | Status: AC
  Filled 2020-03-07: qty 1

## 2020-03-07 MED ORDER — SODIUM CHLORIDE 0.9 % IV BOLUS: 1000.0000 mL | Freq: Once | INTRAVENOUS | Status: DC

## 2020-03-07 MED ORDER — POTASSIUM CHLORIDE 10 MEQ/100ML IV SOLN
10.0000 meq | INTRAVENOUS | Status: AC
  Administered 2020-03-07 – 2020-03-08 (×6): 10 meq via INTRAVENOUS
  Filled 2020-03-07 (×6): qty 100

## 2020-03-07 NOTE — Progress Notes (Signed)
ANTICOAGULATION CONSULT NOTE - Initial Consult  Pharmacy Consult for heparin Indication: pulmonary embolus  Allergies  Allergen Reactions  . Relpax [Eletriptan] Shortness Of Breath    Throat swelling  . Oxycodone Itching    All over the body  . Phenergan [Promethazine Hcl]     Restless, anxious    Patient Measurements:   Heparin Dosing Weight: 77kg  Vital Signs: BP: 157/94 (03/23 1124) Pulse Rate: 104 (03/23 1105)  Labs: Recent Labs    03/04/20 1921 03/07/20 0941  HGB 10.9* 12.4  HCT 35.8* 42.4  PLT 277 408*  APTT  --  29  LABPROT  --  13.3  INR  --  1.0  CREATININE 0.49 0.54    Estimated Creatinine Clearance: 92 mL/min (by C-G formula based on SCr of 0.54 mg/dL).   Medical History: Past Medical History:  Diagnosis Date  . ADVANCED MATERNAL AGE 49/02/2008  . ANEMIA, IRON DEFICIENCY, UNSPEC. 02/12/2007   after IUD  . Constipation   . Contact dermatitis 08/27/2011  . Diabetes mellitus without complication (HCC)    gestational diab. currently & with infant #2  . DIABETES MELLITUS, GESTATIONAL, HX OF 05/18/2008  . Diabetes mellitus, type II (HCC)    Hbg A1C 6.9 after pregancy, now on Metformin  . Diarrhea   . 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 Gastric ulcer    history of  . GERD (gastroesophageal reflux disease)    doesn't take anything for acid reflux  . Gestational diabetes A2/B 07/20/2012   Fetal echo-nml-under media tab, needs baseline labs-nml with nml TSH and 24 hour urine-68mg . To see optho in Nov 2013-Per pt. Ok, serial u/s for growth, 2x/wk testing at 32 wks.   . Hemorrhoids   . 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 IUD complication (HCC) 07/28/2015  . 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  Also r/o for stroke with MRI pending once transferred.  Not on anticoagulation  PTA.    Goal of Therapy:  Heparin level 0.3-0.5 units/ml Monitor platelets by anticoagulation protocol: Yes   Plan:  Heparin gtt at 950 units/hr, no bolus F/u 6 hour heparin level  Daylene Posey, PharmD Clinical Pharmacist Please check AMION for all [facility] Pharmacy numbers 03/07/2020 11:50 AM

## 2020-03-07 NOTE — Procedures (Signed)
Patient Name: Susan Hopkins  MRN: [id number]  Epilepsy Attending: Charlsie Quest  Referring Physician/Provider: Felicie Morn, PA Date: 03/07/2020 Duration: 24.19 mins  Patient history: 49yo F with PRES, AMS and GTC seizure. EEG to evaluate for seizure  Level of alertness: lethargic  AEDs during EEG study: LEV, ativan  Technical aspects: This EEG study was done with scalp electrodes positioned according to the 10-20 International system of electrode placement. Electrical activity was acquired at a sampling rate of 500Hz  and reviewed with a high frequency filter of 70Hz  and a low frequency filter of 1Hz . EEG data were recorded continuously and digitally stored.   DESCRIPTION: No clear  posterior dominant rhythm was seen. EEG showed an excessive amount of 15 to 18 Hz, beta activity distributed symmetrically and diffusely.  Intermittent generalized rhythmic 2-3hz  delta slowing was also noted.  Hyperventilation and photic stimulation were not performed.  ABNORMALITY - Intermittent slow, generalized - Excessive beta, generalized   IMPRESSION: This study is suggestive of moderate diffuse encephalopathy, non specific to etiology. The excessive beta activity seen in the background is most likely due to the effect of benzodiazepine and is a benign EEG pattern. No seizures or definite epileptiform discharges were seen throughout the recording.  [name] 

## 2020-03-07 NOTE — ED Provider Notes (Signed)
Patient transferred from [facility] for new onset seizures, occipital infarct, PE and sinus rhythm on monitor and at this time protecting airway.  Patient received Keppra and Ativan due to recurrent seizures at the outside facility.  She did receive 2 mg of Ativan for transport due to agitation.  Here patient will open her eyes to voice but is altered.  No seizure-like activity at this time.  Blood pressure 168/91.  Spoke with Dr. Wilford Corner who recommends patient going for MRV and he will evaluate when she returns.  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  Patient remains hypertensive and was given IV labetalol.  Patient has already been loaded with Keppra and will be followed by neurology and is having EEG placed.  Will admit for further care.  CRITICAL CARE Performed by: Devynn Scheff Total critical care time: 30 minutes Critical care time was exclusive of separately billable procedures and treating other patients. Critical care was necessary to treat or prevent imminent or life-threatening deterioration. Critical care was time spent personally by me on the following activities: development of treatment plan with patient and/or surrogate as well as nursing, discussions with consultants, evaluation of patient's response to treatment, examination of patient, obtaining history from patient or surrogate, ordering and performing treatments and interventions, ordering and review of laboratory studies, ordering and review of radiographic studies, pulse oximetry and re-evaluation of patient's condition.    Gwyneth Sprout, MD 03/08/20 1544

## 2020-03-07 NOTE — Progress Notes (Signed)
Pt Aox3, but drowsy and not staying awake enough to complete admission questionaire. Will try later.

## 2020-03-07 NOTE — Consult Note (Signed)
[facility] Tele[facility]urology Consult Services   Date of Service:   03/07/2020 10:45:48  Impression:     .  I63.9 - Cerebrovascular accident (CVA), unspecified mechanism ([place])     .  I67.4 - Hypertensive encephalopathy     .  R56.9 - Seizures  Comments/Sign-Out: 49 yo F who presents with encephalopathy, severe HAs, and new onset seizures in settting of vision loss and bilateral occipital lobe hypoattenuation concerning ofr strokes on CT. Differential also includes cerebral sinus venous thrombosis or PRES which can present with similar clinical presentation as well as hypoattenuation on CT.   t is important to quickly delineate CSVT with infarct vs PRES vs isolated ischemic Stroke immediately to determine plan of care. Higher risk of CSVT given concurrent presentation with PE  Metrics: Last Known Well: 03/07/2020 08:40:00 [facility] Notification Time: 03/07/2020 10:45:48 Arrival Time: 03/07/2020 09:30:00 Stamp Time: 03/07/2020 10:45:48 Telephone Response Time: 03/07/2020 10:57:00 Time First Login Attempt: 03/07/2020 10:57:00 Symptoms: vision loss, HAs, confusion, new onset seizures NIHSS Start Assessment Time: 03/07/2020 11:07:12 Patient is not a candidate for Alteplase/Activase. Alteplase Medical Decision: 03/07/2020 11:08:11 Patient was not deemed candidate for Alteplase/Activase thrombolytics because of Concern for completed strkoe.  CT head was reviewed and results were: Bilateral occipital lobe stroke  Clinical Presentation is not Suggestive of Large Vessel Occlusive Disease  ED Physician notified of diagnostic impression and management plan on 03/07/2020 11:13:15  Our recommendations are outlined below.  Recommendations:     .  Activate Stroke Protocol Admission/Order Set     .  Stroke/Telemetry Floor     .  Neuro Checks     .  Bedside Swallow Eval     .  DVT Prophylaxis     .  IV Fluids, Normal Saline     .  Head of Bed 30 Degrees     .   Euglycemia and Avoid Hyperthermia (PRN Acetaminophen)     .  OK for full dose aspirin rectal x 1     .  Transfer for STAT MRI and MRV to determine PRES vs. stroke vs CSVT     .  If PRES decrease blood pressure gradually to normotensive. Can treat empirically with heparin for PE     .  If consistent with stroke, allow permissive HTN but tread carefully with anticoagulation, low dose no bolus heparin infusion until further neuro recs  Routine Consultation with [facility] for Follow up Care  Sign Out:     .  Discussed with Emergency Department Provider    ------------------------------------------------------------------------------  History of Present Illness: Patient is a 49 year old Female.  Patient was brought by private transportation with symptoms of vision loss, HAs, confusion, new onset seizures  49 yo F hx HTN, DM2, migraine, gastric ulcers, who was seen on Friday for severe migraine out of proportion to usual. Workup including CT was negative.. Today headaches again worse than usual so husband drove her to ED. She had 2 seiuzures in the vehicle, and again on arrival. 2 mg Ativan here for 20-30s witnessed seizure. She CT shows bilateral occipital stroke. Lactate is 9.2. CT also shows PE  Last seen normal was within 4.5 hours.      Examination: BP(180/101--16), Pulse(.), Blood Glucose(.) 1A: Level of Consciousness - Requires repeated stimulation to arouse + 2 1B: Ask Month and Age - Could Not Answer Either Question Correctly + 2 1C: Blink Eyes & Squeeze Hands - Performs 0 Tasks + 2 2: Test Horizontal Extraocular Movements - Normal +  0 3: Test Visual Fields - Patient is Bilaterally Blind + 3 4: Test Facial Palsy (Use Grimace if Obtunded) - Normal symmetry + 0 5A: Test Left Arm Motor Drift - No Drift for 10 Seconds + 0 5B: Test Right Arm Motor Drift - No Drift for 10 Seconds + 0 6A: Test Left Leg Motor Drift - No Drift for 5 Seconds + 0 6B: Test Right Leg Motor Drift  - No Drift for 5 Seconds + 0 7: Test Limb Ataxia (FNF/Heel-Shin) - No Ataxia + 0 8: Test Sensation - Normal; No sensory loss + 0 9: Test Language/Aphasia - Severe Aphasia: Fragmentary Expression, Inference Needed, Cannot Identify Materials + 2 10: Test Dysarthria - Severe Dysarthria: Unintelligble Slurring or Out of Proportion to Aphasia + 2 11: Test Extinction/Inattention - No abnormality + 0  NIHSS Score: 13  Pre-Morbid Modified Ranking Scale: 0 Points = No symptoms at all   Patient/Family was informed the Neurology Consult would occur via TeleHealth consult by way of interactive audio and video telecommunications and consented to receiving care in this manner.   Due to the immediate potential for life-threatening deterioration due to underlying acute neurologic illness, I spent 35 minutes providing critical care. This time includes time for face to face visit via telemedicine, review of medical records, imaging studies and discussion of findings with providers, the patient and/or family.   Dr Schuyler Amor   [facility] [phone]  Case [id number]

## 2020-03-07 NOTE — ED Provider Notes (Addendum)
[facility] Emergency Department Provider Note MRN:  [id number]  Arrival date & time: 03/07/20     Chief Complaint   Altered Mental Status   History of Present Illness   Nicolina Hirt is a 49 y.o. year-old female with a history of migraines presenting to the ED with chief complaint of altered mental status.  History obtained from husband as patient is unresponsive.  Bad headache all night, did not sleep.  1 hour prior to arrival patient was complaining of complete vision loss.  In the car on the way here, became less and less responsive.  EMS was in the vicinity when patient was parking and noted some possible seizure activity.  I was unable to obtain an accurate HPI, PMH, or ROS due to the patient's altered mental status.  Level 5 caveat.  Review of Systems  Positive for altered mental status, headache.  Patient's Health History    Past Medical History:  Diagnosis Date  . ADVANCED MATERNAL AGE 63/02/2008  . ANEMIA, IRON DEFICIENCY, UNSPEC. 02/12/2007   after IUD  . Constipation   . Contact dermatitis 08/27/2011  . Diabetes mellitus without complication (HCC)    gestational diab. currently & with infant #2  . DIABETES MELLITUS, GESTATIONAL, HX OF 05/18/2008  . Diabetes mellitus, type II (HCC)    Hbg A1C 6.9 after pregancy, now on Metformin  . Diarrhea   . 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 Gastric ulcer    history of  . GERD (gastroesophageal reflux disease)    doesn't take anything for acid reflux  . Gestational diabetes A2/B 07/20/2012   Fetal echo-nml-under media tab, needs baseline labs-nml with nml TSH and 24 hour urine-68mg . To see optho in Nov 2013-Per pt. Ok, serial u/s for growth, 2x/wk testing at 32 wks.   . Hemorrhoids   . 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 IUD complication (HCC) 07/28/2015  . Migraine headache    last migraine in Sept 2012    Past Surgical History:  Procedure  Laterality Date  . CESAREAN SECTION N/A 01/22/2013   Procedure: CESAREAN SECTION;  Surgeon: Reva Bores, MD;  Location: [facility]S;  Service: Obstetrics;  Laterality: N/A;  Repeat  . CESAREAN SECTION  2005  . CESAREAN SECTION  2010  . CHOLECYSTECTOMY  11/28/2011   Procedure: LAPAROSCOPIC CHOLECYSTECTOMY WITH INTRAOPERATIVE CHOLANGIOGRAM;  Surgeon: Wilmon Arms. Corliss Skains, MD;  Location: [facility] OR;  Service: General;  Laterality: N/A;  . DILATION AND CURETTAGE OF UTERUS  2009  . female circumcision  39  . TONSILLECTOMY  1989    Family History  Problem Relation Age of Onset  . Diabetes Mother   . Hypertension Mother   . Hyperlipidemia Mother   . Hypertension Father   . Hyperlipidemia Father   . Prostate cancer Father   . Diabetes Father   . Anesthesia problems Neg Hx   . Hypotension Neg Hx   . Malignant hyperthermia Neg Hx   . Pseudochol deficiency Neg Hx     Social History   Socioeconomic History  . Marital status: Married    Spouse name: [name]  . Number of children: 4  . Years of education: Ba  . Highest education level: Not on file  Occupational History  . Occupation: [employer]  Tobacco Use  . Smoking status: Never Smoker  . Smokeless tobacco: Never Used  Substance and Sexual Activity  . Alcohol use: No  . Drug use: No  . Sexual activity:  Yes    Birth control/protection: I.U.D.  Other Topics Concern  . Not on file  Social History Narrative   Lives at home with husband and 4 kids.   Right handed.    Caffeine use: Coffee (1-2 cups/day)   Social Determinants of Health   Financial Resource Strain:   . Difficulty of Paying Living Expenses:   Food Insecurity:   . Worried About Charity fundraiser in the Last Year:   . Arboriculturist in the Last Year:   Transportation Needs:   . Film/video editor (Medical):   Marland Kitchen Lack of Transportation (Non-Medical):   Physical Activity:   . Days of Exercise per Week:   . Minutes of Exercise per Session:   Stress:   . Feeling of Stress :    Social Connections:   . Frequency of Communication with Friends and Family:   . Frequency of Social Gatherings with Friends and Family:   . Attends Religious Services:   . Active Member of Clubs or Organizations:   . Attends Archivist Meetings:   Marland Kitchen Marital Status:   Intimate Partner Violence:   . Fear of Current or Ex-Partner:   . Emotionally Abused:   Marland Kitchen Physically Abused:   . Sexually Abused:      Physical Exam   Vitals:   03/07/20 1105 03/07/20 1124  BP: (!) 160/95 (!) 157/94  Pulse: (!) 104   Resp:  (!) 24  SpO2: 90% 97%    CONSTITUTIONAL: Ill-appearing, unresponsive NEURO: No response to pain EYES:  eyes equal and reactive ENT/NECK:  no LAD, no JVD CARDIO: Regular rate, well-perfused, normal S1 and S2 PULM:  CTAB no wheezing or rhonchi, agonal respirations GI/GU:  normal bowel sounds, non-distended, non-tender MSK/SPINE:  No gross deformities, no edema SKIN:  no rash, atraumatic PSYCH:  Appropriate speech and behavior  *Additional and/or pertinent findings included in MDM below  Diagnostic and Interventional Summary    EKG Interpretation  Date/Time:  Tuesday March 07 2020 09:38:03 EDT Ventricular Rate:  78 PR Interval:    QRS Duration: 98 QT Interval:  398 QTC Calculation: 454 R Axis:   71 Text Interpretation: Sinus rhythm Consider right atrial enlargement Low voltage, precordial leads Consider anterior infarct Confirmed by Gerlene Fee [phone]) on 03/07/2020 10:30:21 AM      Labs Reviewed  CBC - Abnormal; Notable for the following components:      Result Value   RBC 5.24 (*)    MCH 23.7 (*)    MCHC 29.2 (*)    RDW 16.1 (*)    Platelets 408 (*)    All other components within normal limits  COMPREHENSIVE METABOLIC PANEL - Abnormal; Notable for the following components:   Potassium 3.0 (*)    CO2 18 (*)    Glucose, Bld 193 (*)    Calcium 8.8 (*)    Albumin 2.8 (*)    Anion gap 19 (*)    All other components within normal limits    ACETAMINOPHEN LEVEL - Abnormal; Notable for the following components:   Acetaminophen (Tylenol), Serum <10 (*)    All other components within normal limits  SALICYLATE LEVEL - Abnormal; Notable for the following components:   Salicylate Lvl <7.7 (*)    All other components within normal limits  LACTIC ACID, PLASMA - Abnormal; Notable for the following components:   Lactic Acid, Venous 8.9 (*)    All other components within normal limits  HCG, QUANTITATIVE, PREGNANCY - Abnormal;  Notable for the following components:   hCG, Beta Chain, Quant, S 5 (*)    All other components within normal limits  LACTIC ACID, PLASMA - Abnormal; Notable for the following components:   Lactic Acid, Venous 9.2 (*)    All other components within normal limits  CBG MONITORING, ED - Abnormal; Notable for the following components:   Glucose-Capillary 186 (*)    All other components within normal limits  ETHANOL  PROTIME-INR  APTT  LACTIC ACID, PLASMA    DG Chest Port 1 View  Final Result    CT Angio Head W or Wo Contrast  Final Result    CT Angio Neck W and/or Wo Contrast  Final Result    MR BRAIN WO CONTRAST    (Results Pending)  MR MRV HEAD W WO CONTRAST    (Results Pending)    Medications  levETIRAcetam (KEPPRA) 4,500 mg in sodium chloride 0.9 % 250 mL IVPB ( Intravenous See Procedure Record 03/07/20 1025)  sodium chloride 0.9 % bolus 1,000 mL (has no administration in time range)  aspirin 300 MG suppository (has no administration in time range)  LORazepam (ATIVAN) injection 4 mg (2 mg Intravenous Given 03/07/20 0949)  levETIRAcetam (KEPPRA) 1000 MG/100ML IVPB (  Stopped 03/07/20 1117)  levETIRAcetam (KEPRRA) 500 MG/100ML IVPB SOLN (  Stopped 03/07/20 1116)  iohexol (OMNIPAQUE) 350 MG/ML injection 100 mL (80 mLs Intravenous Contrast Given 03/07/20 1011)  aspirin suppository 300 mg (300 mg Rectal Given 03/07/20 1119)     Procedures  /  Critical Care .Critical Care Performed by: Sabas Sous,  MD Authorized by: Sabas Sous, MD   Critical care provider statement:    Critical care time (minutes):  45   Critical care was necessary to treat or prevent imminent or life-threatening deterioration of the following conditions:  CNS failure or compromise (status epilepticus)   Critical care was time spent personally by me on the following activities:  Discussions with consultants, evaluation of patient's response to treatment, examination of patient, ordering and performing treatments and interventions, ordering and review of laboratory studies, ordering and review of radiographic studies, pulse oximetry, re-evaluation of patient's condition, obtaining history from patient or surrogate and review of old charts    ED Course and Medical Decision Making  I have reviewed the triage vital signs, the nursing notes, and pertinent available records from the EMR.  Pertinent labs & imaging results that were available during my care of the patient were reviewed by me and considered in my medical decision making (see below for details).     Patient arrives unresponsive, question of seizure activity, recent headache, question of acute vision loss.  While bringing her into the emergency department from the emergency parking area, she begins making some purposeful movements, seems to be moving all extremities.  Still very altered, question post ictal state given the possible seizure activity seen by EMS.  Blood glucose is reassuring.  Primary survey also relatively reassuring, protecting her airway but reduced GCS, monitoring closely.  She is well-perfused with improved breathing, initially agonal but now breathing spontaneously, mildly tachypneic.  Was just in the emergency department for migraine, received CT head without contrast that was unremarkable and patient felt better after migraine cocktail.  Given the question of vision loss and acute headache, will obtain CTA imaging today.  Called to bedside  for seizure activity, patient with initial left gaze preference followed by tonic-clonic behavior, drooling, tongue biting.  Seem to last about 15 to  20 seconds and was starting to resolve.  Given 2 mg Ativan, will provide loading dose Keppra.  CTA imaging in process.  Patient continues to protect her airway, no signs of aspiration, considering need for intubation but will hold off for now.  CT imaging concerning for bilateral occipital strokes.  Code stroke initiated given the patient's vision loss is reported to have occurred suddenly at 8:40 AM.  Evaluated by teleneurology, patient continues to be altered likely in the setting of Ativan versus postictal state versus encephalopathy.  She does not seem to blink to threat.  Given the CT imaging and question of acute for subacute versus possibility of press, patient is not a TPA candidate per teleneurology.  Advised rectal aspirin, advised low-dose heparin without bolus given the finding of PE incidentally noted on imaging.  Talk to Dr. Wilford Corner of [facility] neurology, who will evaluate the patient upon ED arrival, recommend ED to ED transfer to expedite MRI imaging.  Recommending MRI as well as MRV imaging.  Transport pending.  ER to ER transport with Dr. Jacqulyn Bath the accepting ED physician at [facility].  Patient given a total of 2 mg Ativan, 4500 mg Keppra (loading dose), 2 L normal saline, rectal aspirin, and will hopefully start heparin prior to transfer.  Elmer Sow. Pilar Plate, MD [facility] Emergency Medicine [facility] [email or web address]  Final Clinical Impressions(s) / ED Diagnoses     ICD-10-CM   1. Status epilepticus (HCC)  G40.901     ED Discharge Orders    None       Discharge Instructions Discussed with and Provided to Patient:   Discharge Instructions   None       Sabas Sous, MD 03/07/20 1133    Sabas Sous, MD 03/07/20 1438

## 2020-03-07 NOTE — ED Triage Notes (Signed)
Pt has been having a HA all week.  Saw PMD yesterday and got a "shot" per husband.  HA continued and kept her up all night.  This morning husband sts pt reported not being able to see so he brought her to the ED.  Enroute pt started to have seizure.  Pt was unresponsive upon arrival to ED.

## 2020-03-07 NOTE — ED Notes (Signed)
Pt continuing to move around/agitated. Will not remain still for MRI. 1mg  ativan given

## 2020-03-07 NOTE — ED Notes (Signed)
Pt tested for COVID, sent to lab.

## 2020-03-07 NOTE — H&P (Signed)
NAME:  Susan Hopkins, MRN:  [id number], DOB:  [birth year], LOS: 0 ADMISSION DATE:  03/07/2020, Primary: Shirlean Mylar, Susan Hopkins  CHIEF COMPLAINT:  AMS, seizure  Medical Service: Internal Medicine Teaching Service         Attending Physician: Dr. Gust Rung, DO    First Contact: Dr. Ephriam Knuckles Pager: [phone]  Second Contact: Dr. Cleaster Corin Pager: [phone]       After Hours (After 5p/  First Contact Pager: [phone]  weekends / holidays): Second Contact Pager: [phone]    History of present illness   Susan Hopkins is a 49 yo female with PMH of hypertension and diabetes who presents to the ED on 3/23 for progressive headache for the past week. While walking into the emergency department, she underwent a witnessed seizure. History primarily obtained from the patient's husband and patient, herself.  Husband notes pt with a history of migraines. This most recent headache has been going on for about a week and has been worsening. She woke up this morning with significantly diminished vision at which time she went to [place] with her husband and underwent 2 tonic seizures. Neurology consulted and keppra started.Head imaging in the ED suggestive of PRES.  Of note, PMH significant for eclampsia with seizures in her 3 prior pregnancies.  Labs significant for a lactic acidosis; lactate 9.8, HCO3 18.  Past Medical History  She,  has a past medical history of ADVANCED MATERNAL AGE (05/18/2008), ANEMIA, IRON DEFICIENCY, UNSPEC. 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   Home Medications     Prior to Admission medications   Medication Sig Start Date End Date Taking? Authorizing Provider  furosemide (LASIX) 20 MG tablet Take 20 mg by mouth daily. 10/27/19   Provider, Historical, Susan Hopkins  hydrochlorothiazide (HYDRODIURIL) 12.5 MG tablet Take 12.5 mg by mouth daily. 01/26/20   Provider, Historical, Susan Hopkins  levothyroxine (SYNTHROID) 50 MCG tablet Take 50 mcg by mouth daily. 12/30/19   Provider, Historical, Susan Hopkins  losartan (COZAAR) 100 MG tablet Take 100 mg by mouth daily. 01/26/20   Provider, Historical, Susan Hopkins  metFORMIN (GLUCOPHAGE) 1000 MG tablet Take 1,000 mg by mouth in the morning and at bedtime. 12/30/19   Provider, Historical, Susan Hopkins  ondansetron (ZOFRAN) 4 MG tablet Take 1 tablet (4 mg total) by mouth every 8 (eight) hours as needed for nausea or vomiting. 04/06/18   Nilda Riggs, NP  propranolol ER (INDERAL LA) 60 MG 24 hr capsule Take 1 capsule (60 mg total) by mouth at bedtime. 02/09/20   Lomax, Amy, NP  tiZANidine (ZANAFLEX) 4 MG tablet TAKE 1 TABLET (4 MG TOTAL) BY MOUTH EVERY 6 (SIX) HOURS AS NEEDED FOR MUSCLE SPASMS. 08/06/19   Anson Fret, Susan Hopkins  traMADol (ULTRAM) 50 MG tablet TAKE 1 TO 2 TABLETS EVERY 6 HOURS FOR PAIN 02/09/20   Lomax, Amy, NP  Vitamin D, Ergocalciferol, (DRISDOL) 50000 units CAPS capsule TAKE 1 CAPSULE BY MOUTH ONCE A WEEK. FOR 8 WEEKS 07/17/18   Diallo, Lilia Argue, Susan Hopkins  zolpidem (AMBIEN) 5 MG tablet Take 1 tablet (5 mg total) by mouth at bedtime as needed for sleep. 03/04/20   Jacalyn Lefevre, Susan Hopkins    Allergies    Allergies as of 03/07/2020 - Review Complete  03/07/2020  Allergen Reaction Noted   Relpax [eletriptan] Shortness Of Breath 11/14/2015   Oxycodone Itching 12/26/2011   Phenergan [promethazine hcl]  04/06/2018    Social History   reports that she has never smoked. She has never used smokeless tobacco. She reports that she does not drink alcohol or use drugs.   Family History   Her family history includes Diabetes in her  father and mother; Hyperlipidemia in her father and mother; Hypertension in her father and mother; Prostate cancer in her father. There is no history of Anesthesia problems, Hypotension, Malignant hyperthermia, or Pseudochol deficiency.   ROS  Review of Systems  Constitutional: Negative for chills and fever.  Eyes: Positive for blurred vision.  Respiratory: Negative.   Cardiovascular: Negative.   Gastrointestinal: Negative.   Genitourinary: Negative.   Musculoskeletal: Positive for neck pain.  Skin: Negative.   Neurological: Positive for seizures, weakness and headaches.  Psychiatric/Behavioral: Negative.     Objective   Blood pressure (!) 151/91, pulse 87, resp. rate 20, SpO2 100 %.     Examination: GENERAL: non-toxic but ill appearing HEENT: head atraumatic. Neck pain to palpation over the paraspinal muscles. No nuchal rigidity CARDIAC: heart RRR. No peripheral edema PULMONARY: breathing comfortably on room air. Lungs clear. ABDOMEN: soft. Nontender to palpation.  Nondistended.  NEURO: falls asleep intermittently but awakens to voice. Oriented to person and place. No focal CN deficit appreciated. Strength equal bilaterally in upper and lower extremities.  SKIN: no rash or lesions on limited exam  MSK: no muscular tenderness  Significant Diagnostic Tests:  EKG: SR. No STE or T-wave inversions  CXR: no acute findings CTA/MRA>Findings consistent with PRES  Labs    CBC Latest Ref Rng & Units 03/07/2020 03/04/2020 05/04/2018  WBC 4.0 - 10.5 K/uL 6.1 5.2 5.0  Hemoglobin 12.0 - 15.0 g/dL 12.4 10.9(L) 10.2(L)  Hematocrit 36.0 - 46.0 % 42.4 35.8(L) 33.4(L)  Platelets 150 - 400 K/uL 408(H) 277 342   BMP Latest Ref Rng & Units 03/07/2020 03/04/2020 10/29/2016  Glucose 70 - 99 mg/dL 193(H) 149(H) 117(H)  BUN 6 - 20 mg/dL 9 8 7   Creatinine 0.44 - 1.00 mg/dL 0.54 0.49 0.45(L)  BUN/Creat Ratio 9 - 23 - - -  Sodium 135 - 145 mmol/L 138 136 138  Potassium 3.5 - 5.1 mmol/L 3.0(L) 3.1(L)  3.7  Chloride 98 - 111 mmol/L 101 103 105  CO2 22 - 32 mmol/L 18(L) 23 28  Calcium 8.9 - 10.3 mg/dL 8.8(L) 8.2(L) 8.8     Summary  Susan Hopkins is a 49 yo female with PMH diabetes and hypertension who is presenting for 1w history of progressive headache and developed 2 tonic seizures while going into the ED who was found to have PRES syndrome on head imaging and a lactic acidosis.  Assessment & Plan:  Active Problems:   PRES (posterior reversible encephalopathy syndrome)  Hypertensive emergency with possible seizure development. No seizure activity identified on EEG PRES. As seen in head imaging on admission. Appreciate Neurology's assistance Lactic acidosis likely 2/2 to seizures. Plan Strict blood pressure control with SBP <132mmHg, DBP <90 Continue keppra 500mg  bid Seizure precautions Speech eval for swallow study. NPO for now  Pulmonary embolism. Incidental finding on neck imaging obtained on admission. Plan: Will place on heparin drip for tonight. No loading dose per neurology. Will need long term anticoagulation at discharge.  Will obtain echocardiogram inpatient  Hypokalemia. Replete as needed.  Best practice:  CODE STATUS: Full Diet: NPO DVT for prophylaxis:  heparin drip Dispo: Admit patient to Inpatient with expected length of stay greater than 2 midnights.   Susan Radon, Susan Hopkins INTERNAL MEDICINE RESIDENT PGY-1 Pager # [phone] 03/07/20  7:26 PM

## 2020-03-07 NOTE — ED Notes (Signed)
Pt in MRI and very agitated. Trying to roll over or sit up. 1mg  ativan given

## 2020-03-07 NOTE — ED Notes (Signed)
Patient assisted from car with ER staff and [facility].  Agonal resp. Noted, jaw thrust applied to open airway, resp noted.  Placed on [place] @ 2L in room 14 with SpO2 99%.

## 2020-03-07 NOTE — Progress Notes (Signed)
EEG complete - results pending 

## 2020-03-07 NOTE — ED Notes (Signed)
Lab in room getting labs.  RN ordered lactic b/c lactic lab could not be found.

## 2020-03-07 NOTE — ED Notes (Signed)
Provider called requesting labs, RN requested that Lab pull labs.  Also reordered Lactic Lab that the main lab reports they did not get.

## 2020-03-07 NOTE — ED Notes (Signed)
[facility]. Pt came with heparin running. Continued here at 9.30ml/hr

## 2020-03-07 NOTE — Consult Note (Addendum)
Neurology Consultation  Reason for Consult: Headache, loss of vision Referring Physician: Blanchie Dessert, MD  CC: Headache and loss of vision  History is obtained from: Husband  HPI: Susan Hopkins is a 49 y.o. female with past medical history of migraine headaches, diabetes.  Patient was brought to the hospital secondary to severe headache and loss of vision this morning.  Neurology was consulted for this reason.    Patient initially went to [place] secondary to encephalopathy, severe headaches and new onset seizure in the setting of loss of vision.  Due to the differential being cerebral sinus venous thrombosis or PRES patient was sent to [facility].  Currently patient is very lethargic thus most of the history was obtained by the husband.  He states she has had 2 years of migraines however this headache was very different.  This a.m. she was complaining of severe headache in the back of her head along with loss of vision.  When he brought to [place] as noted above she had 2 tonic seizures.  1000 mg Keppra was administered.  Due to question of venous sinus thrombosis versus venous thrombosis patient was sent for MRI.  Currently patient is very lethargic status post MRI.  She does withdraw from noxious stimuli.  When asked questions she speaks in low voice and mumbles.   Chart review- Patient was seen on 3/20 in the ED secondary to emesis and headache.  Initially she had come in in the morning but then returned to the emergency department later that day secondary to having her headache returned.  At that time it was mentioned that she had said the losartan pill that she usually refills looked different and she believes there was a possibility was causing her symptoms.  At that time patient stated that she was not feeling well as far as sleeping thus she was given Ambien for sleep.  Past Medical History:  Diagnosis Date  . ADVANCED MATERNAL AGE 57/02/2008  . ANEMIA,  IRON DEFICIENCY, UNSPEC. 02/12/2007   after IUD  . Constipation   . Contact dermatitis 08/27/2011  . Diabetes mellitus without complication ([place])    gestational diab. currently & with infant #2  . DIABETES MELLITUS, GESTATIONAL, HX OF 05/18/2008  . Diabetes mellitus, type II ([place])    Hbg A1C 6.9 after pregancy, now on Metformin  . Diarrhea   . 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 Gastric ulcer    history of  . GERD (gastroesophageal reflux disease)    doesn't take anything for acid reflux  . Gestational diabetes A2/B 07/20/2012   Fetal echo-nml-under media tab, needs baseline labs-nml with nml TSH and 24 hour urine-68mg . To see optho in Nov 2013-Per pt. Ok, serial u/s for growth, 2x/wk testing at 32 wks.   . Hemorrhoids   . 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 IUD complication ([place]) 0/93/8182  . Migraine headache    last migraine in Sept 2012    Family History  Problem Relation Age of Onset  . Diabetes Mother   . Hypertension Mother   . Hyperlipidemia Mother   . Hypertension Father   . Hyperlipidemia Father   . Prostate cancer Father   . Diabetes Father   . Anesthesia problems Neg Hx   . Hypotension Neg Hx   . Malignant hyperthermia Neg Hx   . Pseudochol deficiency Neg Hx    Social History:   reports that she has never smoked. She  has never used smokeless tobacco. She reports that she does not drink alcohol or use drugs.  Medications  Current Facility-Administered Medications:  .  diphenhydrAMINE (BENADRYL) injection 25 mg, 25 mg, Intravenous, Once, Plunkett, Whitney, MD .  heparin ADULT infusion 100 units/mL (25000 units/233mL sodium chloride 0.45%), 950 Units/hr, Intravenous, Continuous, Oriet, Jonathan, RPH .  labetalol (NORMODYNE) injection 20 mg, 20 mg, Intravenous, Once, Plunkett, Whitney, MD .  levETIRAcetam (KEPPRA) 4,500 mg in sodium chloride 0.9 % 250 mL IVPB, 4,500 mg, Intravenous, Once, Sabas Sous, MD .   LORazepam (ATIVAN) 2 MG/ML injection, , , ,  .  LORazepam (ATIVAN) 2 MG/ML injection, , , ,  .  LORazepam (ATIVAN) injection 2 mg, 2 mg, Intravenous, Once, Gwyneth Sprout, MD  Current Outpatient Medications:  .  furosemide (LASIX) 20 MG tablet, Take 20 mg by mouth daily., Disp: , Rfl:  .  hydrochlorothiazide (HYDRODIURIL) 12.5 MG tablet, Take 12.5 mg by mouth daily., Disp: , Rfl:  .  levothyroxine (SYNTHROID) 50 MCG tablet, Take 50 mcg by mouth daily., Disp: , Rfl:  .  losartan (COZAAR) 100 MG tablet, Take 100 mg by mouth daily., Disp: , Rfl:  .  metFORMIN (GLUCOPHAGE) 1000 MG tablet, Take 1,000 mg by mouth in the morning and at bedtime., Disp: , Rfl:  .  ondansetron (ZOFRAN) 4 MG tablet, Take 1 tablet (4 mg total) by mouth every 8 (eight) hours as needed for nausea or vomiting., Disp: 20 tablet, Rfl: 3 .  propranolol ER (INDERAL LA) 60 MG 24 hr capsule, Take 1 capsule (60 mg total) by mouth at bedtime., Disp: 90 capsule, Rfl: 3 .  tiZANidine (ZANAFLEX) 4 MG tablet, TAKE 1 TABLET (4 MG TOTAL) BY MOUTH EVERY 6 (SIX) HOURS AS NEEDED FOR MUSCLE SPASMS., Disp: 30 tablet, Rfl: 11 .  traMADol (ULTRAM) 50 MG tablet, TAKE 1 TO 2 TABLETS EVERY 6 HOURS FOR PAIN, Disp: 30 tablet, Rfl: 0 .  Vitamin D, Ergocalciferol, (DRISDOL) 50000 units CAPS capsule, TAKE 1 CAPSULE BY MOUTH ONCE A WEEK. FOR 8 WEEKS, Disp: 8 capsule, Rfl: 0 .  zolpidem (AMBIEN) 5 MG tablet, Take 1 tablet (5 mg total) by mouth at bedtime as needed for sleep., Disp: 10 tablet, Rfl: 0  ROS:  Unable to obtain due to altered mental status.   Exam: Current vital signs: BP (!) 174/101 (BP Location: Left Arm)   Pulse 87   Resp 17   SpO2 100%  Vital signs in last 24 hours: Pulse Rate:  [78-104] 87 (03/23 1400) Resp:  [14-26] 17 (03/23 1400) BP: (150-183)/(80-105) 174/101 (03/23 1400) SpO2:  [90 %-100 %] 100 % (03/23 1400)   Constitutional: Appears well-developed and well-nourished.  Eyes: No scleral injection HENT: No OP  obstrucion Head: Normocephalic.  Cardiovascular: Normal rate and regular rhythm.  Respiratory: Effort normal, non-labored breathing GI: Soft.  No distension. There is no tenderness.  Skin: WDI  Neuro: Mental Status: Patient is very lethargic at this point.  She is also had 2 of Ativan while in the MRI.  She does not follow commands.  Withdraws from noxious stimuli all 4 extremities.  Moaning when asked questions. Cranial Nerves: II: No blink to threat III,IV, VI: Eyes are disconjugate with pupils equal round reactive V: Facial sensation intact VII: Facial movement is symmetric.   Motor: Withdraws briskly in all 4 extremities from noxious stimuli Sensory: Withdraws briskly in all 4 extremities from noxious stimuli Deep Tendon Reflexes: 2+ and symmetric in the biceps no knee jerk or  ankle jerk noted Plantars: Mute bilaterally Cerebellar: Unable to obtain  Labs I have reviewed labs in epic and the results pertinent to this consultation are:   CBC    Component Value Date/Time   WBC 6.1 03/07/2020 0941   RBC 5.24 (H) 03/07/2020 0941   HGB 12.4 03/07/2020 0941   HGB 10.2 (L) 05/04/2018 1416   HCT 42.4 03/07/2020 0941   HCT 33.4 (L) 05/04/2018 1416   PLT 408 (H) 03/07/2020 0941   PLT 342 05/04/2018 1416   MCV 80.9 03/07/2020 0941   MCV 70 (L) 05/04/2018 1416   MCH 23.7 (L) 03/07/2020 0941   MCHC 29.2 (L) 03/07/2020 0941   RDW 16.1 (H) 03/07/2020 0941   RDW 16.9 (H) 05/04/2018 1416   LYMPHSABS 1.9 03/04/2020 1921   MONOABS 0.3 03/04/2020 1921   EOSABS 0.1 03/04/2020 1921   BASOSABS 0.0 03/04/2020 1921    CMP     Component Value Date/Time   NA 138 03/07/2020 0941   NA 140 03/09/2015 1356   K 3.0 (L) 03/07/2020 0941   CL 101 03/07/2020 0941   CO2 18 (L) 03/07/2020 0941   GLUCOSE 193 (H) 03/07/2020 0941   BUN 9 03/07/2020 0941   BUN 8 03/09/2015 1356   CREATININE 0.54 03/07/2020 0941   CREATININE 0.45 (L) 10/29/2016 1542   CALCIUM 8.8 (L) 03/07/2020 0941   PROT  6.8 03/07/2020 0941   PROT 7.0 03/09/2015 1356   ALBUMIN 2.8 (L) 03/07/2020 0941   ALBUMIN 4.2 03/09/2015 1356   AST 22 03/07/2020 0941   ALT 28 03/07/2020 0941   ALKPHOS 60 03/07/2020 0941   BILITOT 0.9 03/07/2020 0941   BILITOT 0.2 03/09/2015 1356   GFRNONAA >60 03/07/2020 0941   GFRAA >60 03/07/2020 0941    Lipid Panel  No results found for: CHOL, TRIG, HDL, CHOLHDL, VLDL, LDLCALC, LDLDIRECT   Imaging I have reviewed the images obtained:  CTA head neck- IMPRESSION: 1. Acute/subacute bilateral occipital lobe infarct. 2. Thickening of the bilateral optic nerve sheath complex. 3. Left pulmonary artery embolus.  MRV brain-superior sagittal sinus, straight sinus, vein of [place], and internal cerebral veins are patent.  Both transverse and sigmoid sinuses are patent  MRI examination of the brain-I findings most consistent with posterior reversible encephalopathy syndrome  Felicie Morn PA-C [facility] [phone]  M-F  (9:00 am- 5:00 PM)  03/07/2020, 2:55 PM   Assessment:  This is a 49 year old female with prolonged history of migraine headaches.  Who returned to the hospital secondary to losing sight this morning and significant posterior headache.  Current exam shows patient very drowsy secondary to Ativan.  MRI does show bilateral PRES in the posterior cerebral regions. No evidence of dural venous sinus thrombosis on the MRV.  Impression: Hypertensive emergency with possible seizures and status epilepticus-resolved. Posterior reversible encephalopathy syndrome Pulmonary embolism  Recommendations: -Stepdown admission to medicine -Routine EEG -Blood pressure control keeping systolic less than 140 and diastolic less than 90 if possible -Given a load of 1 g Keppra IV in the emergency room at [facility], continue Keppra 500 mg twice daily -Seizure precautions -will need anticoagulation for the PE-given the posterior reversible encephalopathy  syndrome changes, would recommend lower PTT/low heparin level-without bolus.  Will need long-term anticoagulation with an oral anticoagulant.  Will defer to medicine.  Attending Neurohospitalist Addendum Patient seen and examined with APP/Resident. Agree with the history and physical as documented above. Agree with the plan as documented, which I helped formulate. I have  independently reviewed the chart, obtained history, review of systems and examined the patient.I have personally reviewed pertinent head/neck/spine imaging (CT/MRI). Please feel free to call with any questions. --- Milon Dikes, MD [facility] Pager: [phone]  CRITICAL CARE ATTESTATION Performed by: Milon Dikes, MD Total critical care time: 45 minutes Critical care time was exclusive of separately billable procedures and treating other patients and/or supervising APPs/Residents/Students Critical care was necessary to treat or prevent imminent or life-threatening deterioration due to posterior reversible encephalopathy syndrome, hypertensive emergency, status epilepticus and seizures.  This patient is critically ill and at significant risk for neurological worsening and/or death and care requires constant monitoring. Critical care was time spent personally by me on the following activities: development of treatment plan with patient and/or surrogate as well as nursing, discussions with consultants, evaluation of patient's response to treatment, examination of patient, obtaining history from patient or surrogate, ordering and performing treatments and interventions, ordering and review of laboratory studies, ordering and review of radiographic studies, pulse oximetry, re-evaluation of patient's condition, participation in multidisciplinary rounds and medical decision making of high complexity in the care of this patient.

## 2020-03-07 NOTE — Progress Notes (Signed)
ANTICOAGULATION CONSULT NOTE  Pharmacy Consult for heparin Indication: pulmonary embolus  Allergies  Allergen Reactions  . Relpax [Eletriptan] Shortness Of Breath    Throat swelling  . Oxycodone Itching    All over the body  . Phenergan [Promethazine Hcl]     Restless, anxious    Patient Measurements: Heparin Dosing Weight: 77kg  Vital Signs: BP: 157/90 (03/23 2030) Pulse Rate: 91 (03/23 2030)  Labs: Recent Labs    03/07/20 0941 03/07/20 2057  HGB 12.4  --   HCT 42.4  --   PLT 408*  --   APTT 29  --   LABPROT 13.3  --   INR 1.0  --   HEPARINUNFRC  --  <0.10*  CREATININE 0.54  --     Estimated Creatinine Clearance: 92 mL/min (by C-G formula based on SCr of 0.54 mg/dL).   Medical History: Past Medical History:  Diagnosis Date  . ADVANCED MATERNAL AGE 49/02/2008  . ANEMIA, IRON DEFICIENCY, UNSPEC. 02/12/2007   after IUD  . Constipation   . Contact dermatitis 08/27/2011  . Diabetes mellitus without complication (HCC)    gestational diab. currently & with infant #2  . DIABETES MELLITUS, GESTATIONAL, HX OF 05/18/2008  . Diabetes mellitus, type II (HCC)    Hbg A1C 6.9 after pregancy, now on Metformin  . Diarrhea   . 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 Gastric ulcer    history of  . GERD (gastroesophageal reflux disease)    doesn't take anything for acid reflux  . Gestational diabetes A2/B 07/20/2012   Fetal echo-nml-under media tab, needs baseline labs-nml with nml TSH and 24 hour urine-68mg . To see optho in Nov 2013-Per pt. Ok, serial u/s for growth, 2x/wk testing at 32 wks.   . Hemorrhoids   . 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 IUD complication (HCC) 07/28/2015  . 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 Not on anticoagulation PTA. Also r/o for stroke with MRI - findings most  consistent with PRES.  Heparin level tonight came back undetectable (<0.1)- heparin infusion has been running at 950 units/hr (see notes- was [facility] at [facility]). Hgb 12.4, plt 408. No s/sx of bleeding - IV is positional per nursing (will monitor).   Goal of Therapy:  Heparin level 0.3-0.5 units/ml Monitor platelets by anticoagulation protocol: Yes   Plan:  Increase heparin infusion to 1150 units/hr  F/u 6 hour heparin level Monitor CBC, HL, and for s/sx of bleeding   Sherron Monday, PharmD, BCCCP Clinical Pharmacist  Phone: [phone]  Please check AMION for all [facility] phone numbers After 10:00 PM, call Main Pharmacy [phone] 03/07/2020 9:49 PM

## 2020-03-08 ENCOUNTER — Inpatient Hospital Stay (HOSPITAL_COMMUNITY): Payer: BLUE CROSS/BLUE SHIELD

## 2020-03-08 DIAGNOSIS — E1122 Type 2 diabetes mellitus with diabetic chronic kidney disease: Secondary | ICD-10-CM

## 2020-03-08 DIAGNOSIS — I129 Hypertensive chronic kidney disease with stage 1 through stage 4 chronic kidney disease, or unspecified chronic kidney disease: Secondary | ICD-10-CM

## 2020-03-08 DIAGNOSIS — I351 Nonrheumatic aortic (valve) insufficiency: Secondary | ICD-10-CM

## 2020-03-08 DIAGNOSIS — I361 Nonrheumatic tricuspid (valve) insufficiency: Secondary | ICD-10-CM

## 2020-03-08 DIAGNOSIS — N189 Chronic kidney disease, unspecified: Secondary | ICD-10-CM

## 2020-03-08 LAB — CBC WITH DIFFERENTIAL/PLATELET
Abs Immature Granulocytes: 0.02 10*3/uL (ref 0.00–0.07)
Basophils Absolute: 0 10*3/uL (ref 0.0–0.1)
Basophils Relative: 0 %
Eosinophils Absolute: 0 10*3/uL (ref 0.0–0.5)
Eosinophils Relative: 0 %
HCT: 34.8 % — ABNORMAL LOW (ref 36.0–46.0)
Hemoglobin: 10.9 g/dL — ABNORMAL LOW (ref 12.0–15.0)
Immature Granulocytes: 0 %
Lymphocytes Relative: 31 %
Lymphs Abs: 1.7 10*3/uL (ref 0.7–4.0)
MCH: 24.2 pg — ABNORMAL LOW (ref 26.0–34.0)
MCHC: 31.3 g/dL (ref 30.0–36.0)
MCV: 77.3 fL — ABNORMAL LOW (ref 80.0–100.0)
Monocytes Absolute: 0.6 10*3/uL (ref 0.1–1.0)
Monocytes Relative: 10 %
Neutro Abs: 3.2 10*3/uL (ref 1.7–7.7)
Neutrophils Relative %: 59 %
Platelets: 295 10*3/uL (ref 150–400)
RBC: 4.5 MIL/uL (ref 3.87–5.11)
RDW: 16 % — ABNORMAL HIGH (ref 11.5–15.5)
WBC: 5.5 10*3/uL (ref 4.0–10.5)
nRBC: 0 % (ref 0.0–0.2)

## 2020-03-08 LAB — HEPARIN LEVEL (UNFRACTIONATED)
Heparin Unfractionated: 0.13 IU/mL — ABNORMAL LOW (ref 0.30–0.70)
Heparin Unfractionated: 0.15 [IU]/mL — ABNORMAL LOW (ref 0.30–0.70)
Heparin Unfractionated: 0.31 [IU]/mL (ref 0.30–0.70)
Heparin Unfractionated: 0.37 IU/mL (ref 0.30–0.70)

## 2020-03-08 LAB — BASIC METABOLIC PANEL WITH GFR
Anion gap: 8 (ref 5–15)
BUN: <5 mg/dL — ABNORMAL LOW (ref 6–20)
CO2: 24 mmol/L (ref 22–32)
Calcium: 7.8 mg/dL — ABNORMAL LOW (ref 8.9–10.3)
Chloride: 109 mmol/L (ref 98–111)
Creatinine, Ser: 0.52 mg/dL (ref 0.44–1.00)
GFR calc Af Amer: >60 mL/min
GFR calc non Af Amer: >60 mL/min
Glucose, Bld: 123 mg/dL — ABNORMAL HIGH (ref 70–99)
Potassium: 3.3 mmol/L — ABNORMAL LOW (ref 3.5–5.1)
Sodium: 141 mmol/L (ref 135–145)

## 2020-03-08 LAB — HEMOGLOBIN A1C
Hgb A1c MFr Bld: 6.8 % — ABNORMAL HIGH (ref 4.8–5.6)
Mean Plasma Glucose: 148.46 mg/dL

## 2020-03-08 LAB — ECHOCARDIOGRAM COMPLETE: Weight: 3156.99 oz

## 2020-03-08 LAB — HIV ANTIBODY (ROUTINE TESTING W REFLEX): HIV Screen 4th Generation wRfx: NONREACTIVE

## 2020-03-08 LAB — LACTIC ACID, PLASMA: Lactic Acid, Venous: 1.2 mmol/L (ref 0.5–1.9)

## 2020-03-08 LAB — GLUCOSE, CAPILLARY
Glucose-Capillary: 106 mg/dL — ABNORMAL HIGH (ref 70–99)
Glucose-Capillary: 123 mg/dL — ABNORMAL HIGH (ref 70–99)
Glucose-Capillary: 131 mg/dL — ABNORMAL HIGH (ref 70–99)

## 2020-03-08 LAB — SARS CORONAVIRUS 2 (TAT 6-24 HRS): SARS Coronavirus 2: NEGATIVE

## 2020-03-08 MED ORDER — PROPRANOLOL HCL ER 60 MG PO CP24
60.0000 mg | ORAL_CAPSULE | Freq: Every day | ORAL | Status: DC
  Administered 2020-03-08: 60 mg via ORAL
  Filled 2020-03-08 (×2): qty 1

## 2020-03-08 MED ORDER — ACETAMINOPHEN 325 MG PO TABS
650.0000 mg | ORAL_TABLET | ORAL | Status: DC | PRN
  Administered 2020-03-08 – 2020-03-10 (×8): 650 mg via ORAL
  Filled 2020-03-08 (×8): qty 2

## 2020-03-08 MED ORDER — LOSARTAN POTASSIUM 50 MG PO TABS: 100.0000 mg | ORAL_TABLET | Freq: Every day | ORAL | Status: DC

## 2020-03-08 MED ORDER — POTASSIUM CHLORIDE CRYS ER 20 MEQ PO TBCR: 40.0000 meq | EXTENDED_RELEASE_TABLET | Freq: Two times a day (BID) | ORAL | Status: DC

## 2020-03-08 MED ORDER — INSULIN ASPART 100 UNIT/ML ~~LOC~~ SOLN: 0.0000 [IU] | Freq: Three times a day (TID) | SUBCUTANEOUS | Status: DC

## 2020-03-08 MED ORDER — LOSARTAN POTASSIUM 50 MG PO TABS
50.0000 mg | ORAL_TABLET | Freq: Every day | ORAL | Status: DC
  Administered 2020-03-08 – 2020-03-09 (×2): 50 mg via ORAL
  Filled 2020-03-08 (×2): qty 1

## 2020-03-08 MED ORDER — PROPRANOLOL HCL ER 60 MG PO CP24: 60.0000 mg | ORAL_CAPSULE | Freq: Every day | ORAL | Status: DC

## 2020-03-08 MED ORDER — LEVETIRACETAM 500 MG PO TABS
500.0000 mg | ORAL_TABLET | Freq: Two times a day (BID) | ORAL | Status: DC
  Administered 2020-03-08 – 2020-03-10 (×5): 500 mg via ORAL
  Filled 2020-03-08 (×5): qty 1

## 2020-03-08 NOTE — Plan of Care (Signed)

## 2020-03-08 NOTE — Progress Notes (Signed)
  Echocardiogram 2D Echocardiogram has been performed.  Janalyn Harder 03/08/2020, 9:05 AM

## 2020-03-08 NOTE — Progress Notes (Signed)
   NAMEKierston Hopkins, MRN:  [id number], DOB:  October 13, 1971, LOS: 1 ADMISSION DATE:  03/07/2020  Subjective  VSS. No overnight events. Notes that she may have missed a couple doses of her antihypertensives prior to admission.  Still having headache but light sensitivity improved  Objective   Blood pressure (!) 148/80, pulse 97, temperature 99 F (37.2 C), temperature source Oral, resp. rate 18, weight 89.5 kg, SpO2 100 %.     Intake/Output Summary (Last 24 hours) at 03/08/2020 0535 Last data filed at 03/08/2020 0328 Gross per 24 hour  Intake 1900 ml  Output --  Net 1900 ml   Filed Weights   03/08/20 0328  Weight: 89.5 kg    Examination: GENERAL: in no acute distress CARDIAC: heart RRR. Lower extremity edema present. PULMONARY: Lung sounds clear to auscultation. NEURO: alert and oriented. 5/5 strength but still fairly weak in her upper extremities.  Significant Diagnostic Tests:  CTA head/neck>acute/subsacute bilateral occipital lobe infarcts. Left PE.  MRI brain> abnormal subcortical edema in occipital and parietal lobes with some frontal and right cerebellar involvement suggestive of PRES  Labs    CBC Latest Ref Rng & Units 03/08/2020 03/07/2020 03/04/2020  WBC 4.0 - 10.5 K/uL 5.5 6.1 5.2  Hemoglobin 12.0 - 15.0 g/dL 10.9(L) 12.4 10.9(L)  Hematocrit 36.0 - 46.0 % 34.8(L) 42.4 35.8(L)  Platelets 150 - 400 K/uL 295 408(H) 277   BMP Latest Ref Rng & Units 03/07/2020 03/07/2020 03/04/2020  Glucose 70 - 99 mg/dL 295(J) 884(Z) 660(Y)  BUN 6 - 20 mg/dL <3(K) 9 8  Creatinine 1.60 - 1.00 mg/dL 1.09 3.23 5.57  BUN/Creat Ratio 9 - 23 - - -  Sodium 135 - 145 mmol/L 137 138 136  Potassium 3.5 - 5.1 mmol/L 2.5(LL) 3.0(L) 3.1(L)  Chloride 98 - 111 mmol/L 104 101 103  CO2 22 - 32 mmol/L 23 18(L) 23  Calcium 8.9 - 10.3 mg/dL 7.6(L) 8.8(L) 8.2(L)    Summary  Susan Hopkins is a 49 yo female with a PMH of hypertension, diabetes and eclampsiax3 who was admitted to IMTS for further  management of PRES, seizures and PE.  Assessment & Plan:  Active Problems:   PRES (posterior reversible encephalopathy syndrome)  Hypertensive emergency with seizure PRES No seizure activity appreciated on admission EEG. Blood pressures mildly elevated overnight. Vision and mental status improved. Lactic acidosis resolved Appreciate neurology's assistance Plan Continue keppra 500mg  bid. Neurology recommending continuing this at discharge but commenting that this will likely only be needed short term. Seizure precautions Strict blood pressure control with SBP <155mmHg, DBP <90. PRN labetalol and hydralazine for pressures above parameters. Will restart home dose of 60mg  propranolol ER.  Incidental finding of PE. No evidence of heart strain. Echo with moderately elevated pulm artery pressure; EF 60-65%; normal diastolic dysfunction. Plan: continue heparin drip for now given PRES. Will transition to oral anticoagulation prior to discharge. Echo pending. Tele monitoring  Type 2 DM. Blood sugars stable. Will start sensitive SSI with restarting diet  CKD with nephrotic syndrome. Follows at [facility]. Unclear etiology. Was scheduled for renal bx in 1-2w. Will try to reach out to provider there to see if they want to obtain the bx here prior to starting long term anticoagulation.   Best practice:  CODE STATUS: full Diet: cardiac DVT for prophylaxis: heparin drip Dispo: pending further management   [facility], MD INTERNAL MEDICINE RESIDENT PGY-1 PAGER #: [phone] 03/08/20  5:35 AM

## 2020-03-08 NOTE — Progress Notes (Addendum)
NEUROLOGY PROGRESS NOTE  Subjective: -Still having headache -Much more alert states that her vision has improved significantly.  Exam: Vitals:   03/08/20 0359 03/08/20 0717  BP: (!) 148/80 (!) 152/81  Pulse: 97 90  Resp: 18 20  Temp: 99 F (37.2 C) 99.2 F (37.3 C)  SpO2: 100% 100%    Physical Exam  Constitutional: Appears well-developed and well-nourished.  Psych: Affect appropriate to situation Eyes: No scleral injection HENT: No OP obstruction, some periorbital swelling/puffiness Head: Normocephalic.  Cardiovascular: Normal rate and regular rhythm.  Respiratory: Effort normal, non-labored breathing GI: Soft.  No distension. There is no tenderness.  Skin: WDI and mild edema in all ext Neuro:  Mental Status: Alert, oriented, thought content appropriate.  Speech fluent without evidence of aphasia.  Able to simple commands without difficulty. Cranial Nerves: II: Able to count 5 fingers at this point time III,IV, VI: ptosis not present, extra-ocular motions intact bilaterally pupils equal, round, reactive to light and accommodation V,VII: smile symmetric VIII: hearing normal bilaterally Motor: All extremities antigravity Sensory: intact to LT  Medications:  Scheduled: . propranolol ER  60 mg Oral QHS   Continuous: . heparin 1,350 Units/hr (03/08/20 0636)  . levETIRAcetam    . levETIRAcetam 500 mg (03/07/20 2327)    Pertinent Labs/Diagnostics: -Potassium 3.3 -Urine protein greater than 300  Felicie Morn PA-C [facility] [phone]  Assessment: 49 year old female with prolonged history of migraine headaches.  MRI shows PRES in the posterior cerebral regions, likely secondary to hypertension.  EEG suggestive of moderate diffuse encephalopathy, nonspecific to etiology.  No seizure or definite epileptiform discharges noted.  On today's exam patient has improved.  Still has headache, however vision has improved and mental status has  improved.  Impression: -PRES most likely secondary to hypertension -Elevated protein in urine  Recommendations: -Continue to aim for blood pressure less than 140 systolically and less than 90 diastolically. -Continue Keppra 500 mg twice daily-changed from IV to PO.  Kennon Portela will need AED for short term and can be discontinued over next few weeks to months. -Continue seizure precautions -PE management per primary team  -Proteinuria management per primary team.  Spoke with a family member who is a MD and updated him, and also spoke with husband at bedside and answered questions.   03/08/2020, 9:12 AM  Attending Neurohospitalist Addendum Patient seen and examined with APP/Resident. Agree with the history and physical as documented above. Agree with the plan as documented, which I helped formulate. I have independently reviewed the chart, obtained history, review of systems and examined the patient.I have personally reviewed pertinent head/neck/spine imaging (CT/MRI). Please feel free to call with any questions. --- Milon Dikes, MD [facility] Pager: [phone]  If 7pm to 7am, please call on call as listed on AMION.

## 2020-03-08 NOTE — Progress Notes (Signed)
Attempted to touch base with patient's nephrologist, Dr. Elenore Rota, at [facility] regarding if he wanted [place] to obtain a renal bx prior to starting her on long term anticoagulation. Called his [place] office and was told he was unavailable and that I would be transferred to on call provider. Unfortunately, will need to speak to Dr. Elenore Rota as he is her primary nephrologist. Will try to call the office back tomorrow morning. If unable to reach, I think it's reasonable to just have her start anticoagulation and it can be held when her biopsy is scheduled with [facility].

## 2020-03-08 NOTE — Progress Notes (Signed)
ANTICOAGULATION CONSULT NOTE - Follow Up Consult  Pharmacy Consult for Heparin Indication: pulmonary embolus  Allergies  Allergen Reactions  . Relpax [Eletriptan] Shortness Of Breath    Throat swelling  . Oxycodone Itching    All over the body  . Phenergan [Promethazine Hcl]     Restless, anxious    Patient Measurements: Height: 5\' 6"  (167.6 cm) Weight: 197 lb 5 oz (89.5 kg) IBW/kg (Calculated) : 59.3 Heparin Dosing Weight: 77 kg  Vital Signs: Temp: 99.1 F (37.3 C) (03/24 2009) Temp Source: Oral (03/24 2009) BP: 142/77 (03/24 2009) Pulse Rate: 83 (03/24 2009)  Labs: Recent Labs    03/07/20 0941 03/07/20 2057 03/07/20 2057 03/08/20 0023 03/08/20 0023 03/08/20 0527 03/08/20 0547 03/08/20 1200 03/08/20 2006  HGB 12.4  --   --  10.9*  --   --   --   --   --   HCT 42.4  --   --  34.8*  --   --   --   --   --   PLT 408*  --   --  295  --   --   --   --   --   APTT 29  --   --   --   --   --   --   --   --   LABPROT 13.3  --   --   --   --   --   --   --   --   INR 1.0  --   --   --   --   --   --   --   --   HEPARINUNFRC  --  <0.10*   < > 0.13*   < >  --  0.15* 0.31 0.37  CREATININE 0.54 0.50  --   --   --  0.52  --   --   --   CKTOTAL  --  93  --   --   --   --   --   --   --    < > = values in this interval not displayed.    Estimated Creatinine Clearance: 94.8 mL/min (by C-G formula based on SCr of 0.52 mg/dL).  Assessment:  63 YOF presenting with AMS and possible seizure activity , found with L-pulm artery embolus on imaging. Not on anticoagulation PTA. Also r/o for stroke with MRI - findings most consistent with PRES.  Heparin level is now therapeutic (0.3) x2 on 1350 units/hr.   Goal of Therapy:  Heparin level 0.3-0.5 units/ml Monitor platelets by anticoagulation protocol: Yes   Plan:  Continue heparin drip at 1350 units/hr Daily heparin level and CBC. Monitor for s/sx bleeding. Follow up oral anticoagulation plans. Noted plans for renal biopsy at  some point at [facility].   [facility] PharmD., BCPS Clinical Pharmacist 03/08/2020 8:33 PM

## 2020-03-08 NOTE — Progress Notes (Signed)
H/o DM w/o CBG order. [place] (IM) informed.

## 2020-03-08 NOTE — Progress Notes (Signed)
ANTICOAGULATION CONSULT NOTE - Follow Up Consult  Pharmacy Consult for heparin Indication: pulmonary embolus  Labs: Recent Labs    03/07/20 0941 03/07/20 2057 03/08/20 0023 03/08/20 0547  HGB 12.4  --  10.9*  --   HCT 42.4  --  34.8*  --   PLT 408*  --  295  --   APTT 29  --   --   --   LABPROT 13.3  --   --   --   INR 1.0  --   --   --   HEPARINUNFRC  --  <0.10* 0.13* 0.15*  CREATININE 0.54 0.50  --   --   CKTOTAL  --  93  --   --     Assessment: 49yo female subtherapeutic on heparin after rate change; no gtt issues or signs of bleeding per RN.  Goal of Therapy:  Heparin level 0.3-0.5 units/ml   Plan:  Will increase heparin gtt by 2-3 units/kg/hr to 1350 units/hr and check level in 6 hours.    Vernard Gambles, PharmD, BCPS  03/08/2020,6:28 AM

## 2020-03-08 NOTE — Progress Notes (Signed)
ANTICOAGULATION CONSULT NOTE - Follow Up Consult  Pharmacy Consult for Heparin Indication: pulmonary embolus  Allergies  Allergen Reactions  . Relpax [Eletriptan] Shortness Of Breath    Throat swelling  . Oxycodone Itching    All over the body  . Phenergan [Promethazine Hcl]     Restless, anxious    Patient Measurements: Height: 5\' 6"  (167.6 cm) Weight: 197 lb 5 oz (89.5 kg) IBW/kg (Calculated) : 59.3 Heparin Dosing Weight: 77 kg  Vital Signs: Temp: 98.5 F (36.9 C) (03/24 1213) Temp Source: Oral (03/24 1213) BP: 132/76 (03/24 1213) Pulse Rate: 85 (03/24 1213)  Labs: Recent Labs    03/07/20 0941 03/07/20 2057 03/07/20 2057 03/08/20 0023 03/08/20 0527 03/08/20 0547 03/08/20 1200  HGB 12.4  --   --  10.9*  --   --   --   HCT 42.4  --   --  34.8*  --   --   --   PLT 408*  --   --  295  --   --   --   APTT 29  --   --   --   --   --   --   LABPROT 13.3  --   --   --   --   --   --   INR 1.0  --   --   --   --   --   --   HEPARINUNFRC  --  <0.10*   < > 0.13*  --  0.15* 0.31  CREATININE 0.54 0.50  --   --  0.52  --   --   CKTOTAL  --  93  --   --   --   --   --    < > = values in this interval not displayed.    Estimated Creatinine Clearance: 94.8 mL/min (by C-G formula based on SCr of 0.52 mg/dL).  Assessment:  41 YOF presenting with AMS and possible seizure activity , found with L-pulm artery embolus on imaging. Not on anticoagulation PTA. Also r/o for stroke with MRI - findings most consistent with PRES.    Heparin level is now low therapeutic (0.31) on 1350 units/hr. First level in target range.  Goal of Therapy:  Heparin level 0.3-0.5 units/ml Monitor platelets by anticoagulation protocol: Yes   Plan:   Continue heparin drip at 1350 units/hr  Heparin level at 8pm tonight.  Daily heparin level and CBC.  Monitor for s/sx bleeding.  Follow up oral anticoagulation plans. Noted plans for renal biopsy at some point at [facility].   [facility],  RPh Phone: [phone] 03/08/2020,2:43 PM

## 2020-03-08 NOTE — Plan of Care (Signed)

## 2020-03-09 DIAGNOSIS — D509 Iron deficiency anemia, unspecified: Secondary | ICD-10-CM

## 2020-03-09 DIAGNOSIS — G40901 Epilepsy, unspecified, not intractable, with status epilepticus: Secondary | ICD-10-CM

## 2020-03-09 LAB — CBC
HCT: 32.3 % — ABNORMAL LOW (ref 36.0–46.0)
Hemoglobin: 10 g/dL — ABNORMAL LOW (ref 12.0–15.0)
MCH: 23.9 pg — ABNORMAL LOW (ref 26.0–34.0)
MCHC: 31 g/dL (ref 30.0–36.0)
MCV: 77.3 fL — ABNORMAL LOW (ref 80.0–100.0)
Platelets: 288 10*3/uL (ref 150–400)
RBC: 4.18 MIL/uL (ref 3.87–5.11)
RDW: 16.1 % — ABNORMAL HIGH (ref 11.5–15.5)
WBC: 3.3 10*3/uL — ABNORMAL LOW (ref 4.0–10.5)
nRBC: 0 % (ref 0.0–0.2)

## 2020-03-09 LAB — GLUCOSE, CAPILLARY
Glucose-Capillary: 104 mg/dL — ABNORMAL HIGH (ref 70–99)
Glucose-Capillary: 119 mg/dL — ABNORMAL HIGH (ref 70–99)
Glucose-Capillary: 127 mg/dL — ABNORMAL HIGH (ref 70–99)

## 2020-03-09 LAB — HEPARIN LEVEL (UNFRACTIONATED): Heparin Unfractionated: 0.37 IU/mL (ref 0.30–0.70)

## 2020-03-09 LAB — BASIC METABOLIC PANEL
Anion gap: 7 (ref 5–15)
BUN: <5 mg/dL — ABNORMAL LOW (ref 6–20)
CO2: 24 mmol/L (ref 22–32)
Calcium: 7.8 mg/dL — ABNORMAL LOW (ref 8.9–10.3)
Chloride: 110 mmol/L (ref 98–111)
Creatinine, Ser: 0.35 mg/dL — ABNORMAL LOW (ref 0.44–1.00)
GFR calc Af Amer: >60 mL/min (ref >60)
GFR calc non Af Amer: >60 mL/min (ref >60)
Glucose, Bld: 105 mg/dL — ABNORMAL HIGH (ref 70–99)
Potassium: 3 mmol/L — ABNORMAL LOW (ref 3.5–5.1)
Sodium: 141 mmol/L (ref 135–145)

## 2020-03-09 MED ORDER — RAMELTEON 8 MG PO TABS
8.0000 mg | ORAL_TABLET | Freq: Every day | ORAL | Status: DC
  Administered 2020-03-09: 8 mg via ORAL
  Filled 2020-03-09 (×2): qty 1

## 2020-03-09 MED ORDER — PROPRANOLOL HCL ER 60 MG PO CP24
60.0000 mg | ORAL_CAPSULE | Freq: Every day | ORAL | Status: DC
  Administered 2020-03-09: 60 mg via ORAL
  Filled 2020-03-09 (×2): qty 1

## 2020-03-09 MED ORDER — VALPROATE SODIUM 500 MG/5ML IV SOLN
500.0000 mg | Freq: Once | INTRAVENOUS | Status: AC
  Administered 2020-03-09: 500 mg via INTRAVENOUS
  Filled 2020-03-09: qty 5

## 2020-03-09 MED ORDER — METFORMIN HCL 500 MG PO TABS
1000.0000 mg | ORAL_TABLET | Freq: Every day | ORAL | Status: DC
  Administered 2020-03-09 – 2020-03-10 (×2): 1000 mg via ORAL
  Filled 2020-03-09 (×2): qty 2

## 2020-03-09 MED ORDER — HYDROCHLOROTHIAZIDE 25 MG PO TABS
12.5000 mg | ORAL_TABLET | Freq: Every day | ORAL | Status: DC
  Administered 2020-03-09: 12.5 mg via ORAL
  Filled 2020-03-09: qty 1

## 2020-03-09 MED ORDER — HYDROCHLOROTHIAZIDE 25 MG PO TABS
25.0000 mg | ORAL_TABLET | Freq: Every day | ORAL | Status: DC
  Administered 2020-03-10: 25 mg via ORAL
  Filled 2020-03-09: qty 1

## 2020-03-09 MED ORDER — SPIRONOLACTONE 25 MG PO TABS
25.0000 mg | ORAL_TABLET | Freq: Every day | ORAL | Status: DC
  Administered 2020-03-09 – 2020-03-10 (×2): 25 mg via ORAL
  Filled 2020-03-09 (×2): qty 1

## 2020-03-09 MED ORDER — KETOROLAC TROMETHAMINE 30 MG/ML IJ SOLN: 30.0000 mg | Freq: Once | INTRAMUSCULAR | Status: DC

## 2020-03-09 MED ORDER — METOCLOPRAMIDE HCL 5 MG/ML IJ SOLN: 10.0000 mg | Freq: Once | INTRAMUSCULAR | Status: DC

## 2020-03-09 MED ORDER — POTASSIUM CHLORIDE CRYS ER 20 MEQ PO TBCR
40.0000 meq | EXTENDED_RELEASE_TABLET | ORAL | Status: AC
  Administered 2020-03-09 (×2): 40 meq via ORAL
  Filled 2020-03-09 (×2): qty 2

## 2020-03-09 MED ORDER — DIPHENHYDRAMINE HCL 50 MG/ML IJ SOLN: 25.0000 mg | Freq: Once | INTRAMUSCULAR | Status: DC

## 2020-03-09 MED ORDER — LOSARTAN POTASSIUM 50 MG PO TABS
100.0000 mg | ORAL_TABLET | Freq: Every day | ORAL | Status: DC
  Administered 2020-03-10: 100 mg via ORAL
  Filled 2020-03-09: qty 2

## 2020-03-09 MED ORDER — POTASSIUM CHLORIDE CRYS ER 20 MEQ PO TBCR: 40.0000 meq | EXTENDED_RELEASE_TABLET | Freq: Two times a day (BID) | ORAL | Status: DC

## 2020-03-09 MED ORDER — HYDRALAZINE HCL 20 MG/ML IJ SOLN
5.0000 mg | INTRAMUSCULAR | Status: DC | PRN
  Administered 2020-03-09 – 2020-03-10 (×3): 5 mg via INTRAVENOUS
  Filled 2020-03-09 (×3): qty 1

## 2020-03-09 MED ORDER — LOSARTAN POTASSIUM 50 MG PO TABS
50.0000 mg | ORAL_TABLET | Freq: Once | ORAL | Status: AC
  Administered 2020-03-09: 50 mg via ORAL
  Filled 2020-03-09: qty 1

## 2020-03-09 MED ORDER — PNEUMOCOCCAL VAC POLYVALENT 25 MCG/0.5ML IJ INJ: 0.5000 mL | INJECTION | INTRAMUSCULAR | Status: DC

## 2020-03-09 MED ORDER — LEVOTHYROXINE SODIUM 50 MCG PO TABS
50.0000 ug | ORAL_TABLET | Freq: Every day | ORAL | Status: DC
  Administered 2020-03-09 – 2020-03-10 (×2): 50 ug via ORAL
  Filled 2020-03-09 (×2): qty 1

## 2020-03-09 MED ORDER — AMLODIPINE BESYLATE 5 MG PO TABS
5.0000 mg | ORAL_TABLET | Freq: Every day | ORAL | Status: DC
  Administered 2020-03-09 – 2020-03-10 (×2): 5 mg via ORAL
  Filled 2020-03-09 (×2): qty 1

## 2020-03-09 MED ORDER — FUROSEMIDE 40 MG PO TABS
40.0000 mg | ORAL_TABLET | Freq: Every day | ORAL | Status: DC
  Administered 2020-03-09: 40 mg via ORAL
  Filled 2020-03-09: qty 1

## 2020-03-09 MED ORDER — INFLUENZA VAC SPLIT QUAD 0.5 ML IM SUSY: 0.5000 mL | PREFILLED_SYRINGE | INTRAMUSCULAR | Status: DC

## 2020-03-09 MED ORDER — HYDROCHLOROTHIAZIDE 12.5 MG PO CAPS
12.5000 mg | ORAL_CAPSULE | Freq: Once | ORAL | Status: AC
  Administered 2020-03-09: 12.5 mg via ORAL
  Filled 2020-03-09: qty 1

## 2020-03-09 NOTE — Progress Notes (Signed)
PT Cancellation Note  Patient Details Name: Susan Hopkins MRN: [id number] DOB: [birth year]   Cancelled Treatment:    Reason Eval/Treat Not Completed: Medical issues which prohibited therapy. PT attempted x2 today, but per pt chart and RN, the pt's BP is still above the ordered range, and therefore is not yet appropriate for PT evaluation. PT will continue to follow and eval as time/schedule allow once BP is better under control.   Rolm Baptise, PT, DPT   Acute Rehabilitation Department Pager #: [phone]   [name] 03/09/2020, 3:06 PM

## 2020-03-09 NOTE — Progress Notes (Signed)
OT Cancellation Note  Patient Details Name: Susan Hopkins MRN: [id number] DOB: 10-23-1971   Cancelled Treatment:    Reason Eval/Treat Not Completed: Medical issues which prohibited therapy. BP 161/93. Nsg states her BP parameters are 140/90. Pt complaining of pain/headache. Will follow up tomorrow if appropriate.  Thornell Mule, OT/L   Acute OT Clinical Specialist Acute Rehabilitation Services Pager [phone] Office [phone]  03/09/2020, 11:28 AM

## 2020-03-09 NOTE — Progress Notes (Addendum)
NAMEZyrah Hopkins, MRN:  [id number], DOB:  [birth year], LOS: 2 ADMISSION DATE:  03/07/2020  Subjective  No overnight events. VSS Discussed need for anticoagulation for PE. Patient interested in [place]. Discussed discharge plans. She notes she already has been referred to neurology at [facility] last week by her PCP.  Objective   Blood pressure (!) 141/73, pulse 81, temperature 98.7 F (37.1 C), temperature source Oral, resp. rate 18, height 5\' 6"  (1.676 m), weight 89.2 kg, SpO2 95 %.    No intake or output data in the 24 hours ending 03/09/20 0447 Filed Weights   03/08/20 0328 03/09/20 0352  Weight: 89.5 kg 89.2 kg    Examination: GENERAL: in no acute distress CARDIAC: heart RRR. Lower extremity edema present. PULMONARY: Lung sounds clear to auscultation. NEURO: alert and oriented.   Significant Diagnostic Tests:  CTA head/neck>acute/subsacute bilateral occipital lobe infarcts. Left PE.  MRI brain> abnormal subcortical edema in occipital and parietal lobes with some frontal and right cerebellar involvement suggestive of PRES  Labs    CBC Latest Ref Rng & Units 03/09/2020 03/08/2020 03/07/2020  WBC 4.0 - 10.5 K/uL 3.3(L) 5.5 6.1  Hemoglobin 12.0 - 15.0 g/dL 10.0(L) 10.9(L) 12.4  Hematocrit 36.0 - 46.0 % 32.3(L) 34.8(L) 42.4  Platelets 150 - 400 K/uL 288 295 408(H)   BMP Latest Ref Rng & Units 03/09/2020 03/08/2020 03/07/2020  Glucose 70 - 99 mg/dL 105(H) 123(H) 108(H)  BUN 6 - 20 mg/dL <5(L) <5(L) <5(L)  Creatinine 0.44 - 1.00 mg/dL 0.35(L) 0.52 0.50  BUN/Creat Ratio 9 - 23 - - -  Sodium 135 - 145 mmol/L 141 141 137  Potassium 3.5 - 5.1 mmol/L 3.0(L) 3.3(L) 2.5(LL)  Chloride 98 - 111 mmol/L 110 109 104  CO2 22 - 32 mmol/L 24 24 23   Calcium 8.9 - 10.3 mg/dL 7.8(L) 7.8(L) 7.6(L)    Summary  Susan Hopkins is a 49 yo female with a PMH of hypertension, diabetes and eclampsiax3 who was admitted to IMTS for further management of PRES, seizures and PE.  Assessment & Plan:    Active Problems:   PRES (posterior reversible encephalopathy syndrome)  Hypertensive emergency with seizure PRES Blood pressures fairly well controlled last night. Vision and mental status improved.  Appreciate neurology's assistance.  Plan -Continue keppra 500mg  bid. Seizure precautions -Strict blood pressure control with SBP <198mmHg, DBP <90. Continue 60mg  propranolol ER (restarted 3/24) and 50mg  losartan (restarted 3/24) -PRN labetalol and hydralazine for pressures above parameters.   Unprovoked PE. Incidentally noted on admission neck CTA. No evidence of heart strain. Echo with moderately elevated pulm artery pressure; EF 29-93%; normal diastolic dysfunction. Plan: will need to hold heparin drip if IR agrees to renal bx. will transition to xarelto at time of discharge.   Type 2 DM. CBGs stable. SSI while here. Will resume home meds at discharge  Nephrotic syndrome. Renal function wnl. Follows at [facility]. Unclear etiology. Was scheduled for renal bx in 1-2w. Discussed bx with Dr. Audie Clear, her primary nephrologist at [facility]. She would like to have US obtain the bx here prior to starting her on a DOAC for long term anticoagulation for her unprovoked PE. IR consult placed for renal bx--planning for bx tomorrow.   Chronic microcytic anemia. Iron deficiency vs ACD. Baseline appears to be around 10. No intervention required at this time.  Best practice:  CODE STATUS: full Diet: cardiac. diabetic DVT for prophylaxis: heparin drip. Will need to hold prior to bx. Dispo: pending renal bx tomorrow.  Follow up with nephrology and neurology outpatient.   Susan Radon, MD INTERNAL MEDICINE RESIDENT PGY-1 PAGER #: [phone] 03/09/20  4:47 AM

## 2020-03-09 NOTE — Progress Notes (Signed)
ANTICOAGULATION CONSULT NOTE - Follow Up Consult  Pharmacy Consult for Heparin Indication: pulmonary embolus  Allergies  Allergen Reactions  . Relpax [Eletriptan] Shortness Of Breath    Throat swelling  . Oxycodone Itching    All over the body  . Phenergan [Promethazine Hcl]     Restless, anxious    Patient Measurements: Height: 5\' 6"  (167.6 cm) Weight: 196 lb 10.4 oz (89.2 kg) IBW/kg (Calculated) : 59.3 Heparin Dosing Weight: 77 kg  Vital Signs: Temp: 98.6 F (37 C) (03/25 0843) Temp Source: Oral (03/25 0843) BP: 139/92 (03/25 0913) Pulse Rate: 71 (03/25 0913)  Labs: Recent Labs    03/07/20 0941 03/07/20 0941 03/07/20 2057 03/07/20 2057 03/08/20 0023 03/08/20 0527 03/08/20 0547 03/08/20 1200 03/08/20 2006 03/09/20 0314  HGB 12.4   < >  --   --  10.9*  --   --   --   --  10.0*  HCT 42.4  --   --   --  34.8*  --   --   --   --  32.3*  PLT 408*  --   --   --  295  --   --   --   --  288  APTT 29  --   --   --   --   --   --   --   --   --   LABPROT 13.3  --   --   --   --   --   --   --   --   --   INR 1.0  --   --   --   --   --   --   --   --   --   HEPARINUNFRC  --   --  <0.10*   < > 0.13*  --    < > 0.31 0.37 0.37  CREATININE 0.54   < > 0.50  --   --  0.52  --   --   --  0.35*  CKTOTAL  --   --  93  --   --   --   --   --   --   --    < > = values in this interval not displayed.    Estimated Creatinine Clearance: 94.7 mL/min (A) (by C-G formula based on SCr of 0.35 mg/dL (L)).  Assessment:  72 YOF presenting with AMS and possible seizure activity , found with L-pulm artery embolus on imaging. Not on anticoagulation PTA. Also r/o for stroke with MRI - findings most consistent with PRES.    Heparin level remains therapeutic (0.37) on 1350 units/hr.      Prior plans for renal biopsy at [facility], but now planning here, prior to starting oral anticoagulant.  Goal of Therapy:  Heparin level 0.3-0.5 units/ml Monitor platelets by anticoagulation protocol: Yes    Plan:   Continue heparin drip at 1350 units/hr  Daily heparin level and CBC.  Monitor for s/sx bleeding.  Follow up plans for renal biopsy and timing of holding heparin pre-procedure and resuming post-procedure.   [facility], RPh Phone: [phone] 03/09/2020,10:14 AM

## 2020-03-09 NOTE — Progress Notes (Addendum)
NEUROLOGY PROGRESS NOTE   Subjective: Patient still states that she has a headache.  Vision has improved quite a lot, returning near normal.  Exam: Vitals:   03/09/20 0843 03/09/20 0913  BP: (!) 158/97 (!) 139/92  Pulse: 70 71  Resp: 20   Temp: 98.6 F (37 C)   SpO2: 100%     Physical Exam  Constitutional: Appears well-developed and well-nourished.  Psych: Affect appropriate to situation Eyes: No scleral injection HENT: No OP obstrucion Head: Normocephalic.  Cardiovascular: Normal rate and regular rhythm.  Respiratory: Effort normal, non-labored breathing GI: Soft.  No distension. There is no tenderness.  Skin: WDI   Neuro:  Mental Status: Alert, oriented, thought content appropriate.  Speech fluent without evidence of aphasia.  Able to follow 3 step commands without difficulty. Cranial Nerves: II:  Visual fields grossly normal, able to count fingers, able to name colors, able to read without problems III,IV, VI: ptosis not present, extra-ocular motions intact bilaterally pupils equal, round, reactive to light and accommodation V,VII: smile symmetric, facial light touch sensation normal bilaterally VIII: hearing normal bilaterally Motor: Moving all extremities antigravity   Medications:  Scheduled: . [START ON 03/10/2020] influenza vac split quadrivalent PF  0.5 mL Intramuscular Tomorrow-1000  . insulin aspart  0-9 Units Subcutaneous TID WC  . levETIRAcetam  500 mg Oral BID  . losartan  50 mg Oral Daily  . [START ON 03/10/2020] pneumococcal 23 valent vaccine  0.5 mL Intramuscular Tomorrow-1000  . potassium chloride  40 mEq Oral Q4H  . propranolol ER  60 mg Oral QHS   Continuous: . heparin 1,350 Units/hr (03/09/20 0715)  . valproate sodium 500 mg (03/09/20 0950)    Pertinent Labs/Diagnostics: -Potassium 3.0 -Blood glucose 105 -Creatinine 0.35 -Blood pressures have ranged from 139/92-1 41/73  Felicie Morn PA-C [facility] [phone]  Assessment:  49 year old female with prolonged history of migraine headaches. MRI shows PRES in the posterior cerebral regions, likely secondary to hypertension.  EEG suggestive of moderate diffuse encephalopathy, nonspecific to etiology.  No seizure or definite epileptiform discharges noted.    Will continue to keep patient on antiepileptic medication while in hospital and likely as an outpatient until MRI shows resolution of intact parenchymal changes.  On today's exam patient has improved.  Still has headache, however vision continues to show improved and mental status.   Impression: -PRES -Hypertensive urgency  Recommendations: -Trial of 500 mg Depakote x1 for headache-ordered.  Use Tylenol as needed.  She is on heparin, will try to avoid NSAIDs but can try Toradol 1 time if the Depakote does not relieve the headache.  Other recommendations would include using IV Decadron 10 mg x 1 or IV Solu-Medrol 500 mg x 1 as that also helps to break the cycle of these headaches sometimes.  I would avoid opiates. -Continue to keep blood pressures less than 140 systolically and less than 90 diastolically. -Continue seizure precautions -Continue on Keppra for now-do not discontinue on discharge-follow-up with outpatient neurology and make a decision on tapering based on clinical course over the next few weeks to months. -PE management and proteinuria management per primary team   03/09/2020, 10:31 AM  Attending Neurohospitalist Addendum Patient seen and examined with APP/Resident. Agree with the history and physical as documented above. Agree with the plan as documented, which I helped formulate. I have independently reviewed the chart, obtained history, review of systems and examined the patient.I have personally reviewed pertinent head/neck/spine imaging (CT/MRI). Please feel free to call with any  questions. --- Amie Portland, MD [facility] Pager:  [phone]  If 7pm to 7am, please call on call as listed on AMION.

## 2020-03-09 NOTE — Progress Notes (Signed)
PT Cancellation Note  Patient Details Name: Susan Hopkins MRN: [id number] DOB: [birth year]   Cancelled Treatment:    Reason Eval/Treat Not Completed: Medical issues which prohibited therapy. PT attempted eval x2 this morning, but patient BP currently too high to attempt activity while maintaining range set in orders. Discussed plan for further BP medications with RN, and will follow up later today as time/schedule allow to complete evaluation.   Susan Hopkins, PT, DPT   Acute Rehabilitation Department Pager #: [phone]   Susan Hopkins 03/09/2020, 9:18 AM

## 2020-03-10 DIAGNOSIS — R519 Headache, unspecified: Secondary | ICD-10-CM

## 2020-03-10 DIAGNOSIS — Z7984 Long term (current) use of oral hypoglycemic drugs: Secondary | ICD-10-CM

## 2020-03-10 DIAGNOSIS — Z79899 Other long term (current) drug therapy: Secondary | ICD-10-CM

## 2020-03-10 LAB — CBC
HCT: 34.9 % — ABNORMAL LOW (ref 36.0–46.0)
Hemoglobin: 10.8 g/dL — ABNORMAL LOW (ref 12.0–15.0)
MCH: 23.9 pg — ABNORMAL LOW (ref 26.0–34.0)
MCHC: 30.9 g/dL (ref 30.0–36.0)
MCV: 77.2 fL — ABNORMAL LOW (ref 80.0–100.0)
Platelets: 368 10*3/uL (ref 150–400)
RBC: 4.52 MIL/uL (ref 3.87–5.11)
RDW: 16.2 % — ABNORMAL HIGH (ref 11.5–15.5)
WBC: 3.6 10*3/uL — ABNORMAL LOW (ref 4.0–10.5)
nRBC: 0 % (ref 0.0–0.2)

## 2020-03-10 LAB — BASIC METABOLIC PANEL
Anion gap: 8 (ref 5–15)
BUN: <5 mg/dL — ABNORMAL LOW (ref 6–20)
CO2: 20 mmol/L — ABNORMAL LOW (ref 22–32)
Calcium: 8.8 mg/dL — ABNORMAL LOW (ref 8.9–10.3)
Chloride: 111 mmol/L (ref 98–111)
Creatinine, Ser: 0.47 mg/dL (ref 0.44–1.00)
GFR calc Af Amer: >60 mL/min (ref >60)
GFR calc non Af Amer: >60 mL/min (ref >60)
Glucose, Bld: 109 mg/dL — ABNORMAL HIGH (ref 70–99)
Potassium: 4 mmol/L (ref 3.5–5.1)
Sodium: 139 mmol/L (ref 135–145)

## 2020-03-10 LAB — MAGNESIUM: Magnesium: 1.9 mg/dL (ref 1.7–2.4)

## 2020-03-10 LAB — IRON AND TIBC
Iron: 48 ug/dL (ref 28–170)
Saturation Ratios: 24 % (ref 10.4–31.8)
TIBC: 202 ug/dL — ABNORMAL LOW (ref 250–450)
UIBC: 154 ug/dL

## 2020-03-10 LAB — SURGICAL PCR SCREEN
MRSA, PCR: POSITIVE — AB
Staphylococcus aureus: POSITIVE — AB

## 2020-03-10 LAB — FERRITIN: Ferritin: 74 ng/mL (ref 11–307)

## 2020-03-10 LAB — GLUCOSE, CAPILLARY: Glucose-Capillary: 98 mg/dL (ref 70–99)

## 2020-03-10 LAB — HEPARIN LEVEL (UNFRACTIONATED): Heparin Unfractionated: 0.48 IU/mL (ref 0.30–0.70)

## 2020-03-10 MED ORDER — SPIRONOLACTONE 25 MG PO TABS: 25.0000 mg | ORAL_TABLET | Freq: Every day | ORAL | 0 refills | Status: AC

## 2020-03-10 MED ORDER — HYDROCHLOROTHIAZIDE 25 MG PO TABS: 25.0000 mg | ORAL_TABLET | Freq: Every day | ORAL | 0 refills | Status: AC

## 2020-03-10 MED ORDER — LEVETIRACETAM 500 MG PO TABS: 500.0000 mg | ORAL_TABLET | Freq: Two times a day (BID) | ORAL | 0 refills | Status: DC

## 2020-03-10 MED ORDER — MUPIROCIN 2 % EX OINT
1.0000 "application " | TOPICAL_OINTMENT | Freq: Two times a day (BID) | CUTANEOUS | Status: DC
  Administered 2020-03-10: 1 via NASAL
  Filled 2020-03-10: qty 22

## 2020-03-10 MED ORDER — AMLODIPINE BESYLATE 5 MG PO TABS: 5.0000 mg | ORAL_TABLET | Freq: Every day | ORAL | 0 refills | Status: DC

## 2020-03-10 MED ORDER — RIVAROXABAN (XARELTO) VTE STARTER PACK (15 & 20 MG): ORAL_TABLET | ORAL | 0 refills | Status: AC

## 2020-03-10 MED ORDER — SODIUM CHLORIDE 0.9 % IV SOLN
500.0000 mg | Freq: Once | INTRAVENOUS | Status: AC
  Administered 2020-03-10: 500 mg via INTRAVENOUS
  Filled 2020-03-10: qty 4

## 2020-03-10 MED ORDER — CHLORHEXIDINE GLUCONATE CLOTH 2 % EX PADS
6.0000 | MEDICATED_PAD | Freq: Once | CUTANEOUS | Status: AC
  Administered 2020-03-10: 6 via TOPICAL

## 2020-03-10 NOTE — Progress Notes (Addendum)
NEUROLOGY PROGRESS NOTE   Subjective: Patient still states that she has a headache.  Vision has improved quite a lot, returning near normal.  Exam: Vitals:   03/10/20 0813 03/10/20 0931  BP: 129/76 (!) 153/92  Pulse: 75 77  Resp: 18   Temp: 98.6 F (37 C)   SpO2: 99%     Physical Exam  Constitutional: Appears well-developed and well-nourished.  Psych: Affect appropriate to situation Eyes: No scleral injection HENT: No OP obstrucion Head: Normocephalic.  Cardiovascular: Normal rate and regular rhythm.  Respiratory: Effort normal, non-labored breathing GI: Soft.  No distension. There is no tenderness.  Skin: WDI   Neuro:  Mental Status: Alert, oriented, thought content appropriate.  Speech fluent without evidence of aphasia.  Able to follow 3 step commands without difficulty. Cranial Nerves: II:  Visual fields grossly normal, able to count fingers, able to name colors, able to read without problems III,IV, VI: ptosis not present, extra-ocular motions intact bilaterally pupils equal, round, reactive to light and accommodation V,VII: smile symmetric, facial light touch sensation normal bilaterally VIII: hearing normal bilaterally Motor: Moving all extremities antigravity   Medications:  Scheduled: . amLODipine  5 mg Oral Daily  . hydrochlorothiazide  25 mg Oral Daily  . influenza vac split quadrivalent PF  0.5 mL Intramuscular Tomorrow-1000  . levETIRAcetam  500 mg Oral BID  . levothyroxine  50 mcg Oral Q0600  . losartan  100 mg Oral Daily  . metFORMIN  1,000 mg Oral Q breakfast  . mupirocin ointment  1 application Nasal BID  . pneumococcal 23 valent vaccine  0.5 mL Intramuscular Tomorrow-1000  . propranolol ER  60 mg Oral QHS  . ramelteon  8 mg Oral QHS  . spironolactone  25 mg Oral Daily   Continuous: . heparin 1,350 Units/hr (03/10/20 0228)  . methylPREDNISolone (SOLU-MEDROL) injection 500 mg (03/10/20 1004)    Pertinent Labs/Diagnostics: -Potassium  3.0 -Blood glucose 105 -Creatinine 0.35 -Blood pressures have ranged from 139/92-1 41/73  Felicie Morn PA-C [facility] [phone]  Assessment:  49 year old female with prolonged history of migraine headaches. MRI shows PRES, likely secondary to hypertension.  EEG suggestive of moderate diffuse encephalopathy, nonspecific to etiology.  No seizure or definite epileptiform discharges noted.    Will continue to keep patient on antiepileptic medication while in hospital and likely as an outpatient until MRI shows resolution of the changes..  On today's exam patient has improved.  Still has headache, which was resolved some with IV Depakote however vision continues to show improved and mental status.   Impression: -PRES -Hypertensive urgency  Recommendations: -Receiving Solu-Medrol dose today. -Maintain blood pressures less than 140 -Undergoing kidney biopsy today per nephrology/primary team. -Currently on heparin for the PE and likely will be transitioned to an oral anticoagulant. -Upon discharge, I am waiting to hear back from her outpatient neurologist regarding a good regimen for headache prevention.  I would want to send her on a Medrol Dosepak.  Depakote also would have been a good choice but she is already on Keppra, which we can transition to Depakote which would help both with the seizures as well as headaches.  I will update my recommendations as soon as I discussed with the outpatient neurologist.  I might also be able to expedite her outpatient follow-up.  -- Milon Dikes, MD [facility] Pager: [phone] If 7pm to 7am, please call on call as listed on AMION.    Addendum Unable to get in touch with outpatient providers at this time.  As discussed above, would like to switch her over from [place] to Depakote. We will see her tomorrow and make the changes. -- Amie Portland, MD [facility] Pager: [phone] If 7pm to 7am, please call on  call as listed on AMION.

## 2020-03-10 NOTE — Discharge Instructions (Signed)
It was a pleasure meeting you during your hospitalization. Most likely, your seizures were a result of your high blood pressure affecting your brain. We additionally found a blood clot in your lung.  Moving forward, it is going to be important to be very careful to take your blood pressure medicine every day. DO NOT SKIP DOSES. We have adjusted and added a couple of new medications for your blood pressure which have been sent to your pharmacy. -We increased your hydrochlorothiazide dose to 25mg . Continue taking the losartan, lasix and propranolol. I have also added on one called spironolactone and one called amlodipine. These both work on bringing your blood pressure down. We have been giving them to you in the hospital and you have done well with them but if you start to get light headed at home, please call your PCP immediately.  -You have also been started on a seizure medication. Please continue taking this twice a day until you follow up with neurology.  In regards to the blood clot in your lung, I would recommend you start taking Xarelto. This is a blood thinner medication that will help prevent you from getting future clots. Blood clots can be very dangerous when they go to your brain or your lungs so we want to do our best to avoid that from happening. -You will start off with taking this new medication twice a day. Take it with food to improve it's absorption. Do not skip doses of this medication.  Finally, we were ultimately unable to get that kidney biopsy. You will need to speak to your nephrologist to discuss plans for the biopsy moving forward.  Thank you for placing your trust in the Internal Medicine Team and we wish you the very best.

## 2020-03-10 NOTE — Progress Notes (Signed)
  Susan Hopkins is refusing random renal biopsy.  She is requesting to have this done as an outpatient at a later date.   Will discontinue order.  Camdan Burdi S Demarkis Gheen PA-C 03/10/2020 9:04 AM

## 2020-03-10 NOTE — Progress Notes (Signed)
[facility] (IM) informed. Pt positive MRSA/staph per lab.

## 2020-03-10 NOTE — TOC Transition Note (Addendum)
Transition of Care [facility]) - CM/SW Discharge Note   Patient Details  Name: Ikea Demicco MRN: [id number] Date of Birth: October 08, 1971  [facility] CM/SW Contact:  Kermit Balo, RN Phone Number: 03/10/2020, 3:02 PM   Clinical Narrative:    Pt discharging home with her spouse. Spouse is able to provide needed supervision at home.  3 in 1 and walker ordered through [employer] and will be delivered to the room. OT recommending HH services but pt doesn't feel she needs this service.  CM provided pt and spouse coupons for Xarelto (30 day free and $10 co pay). Spouse to provide transport home.    Final next level of care: Home/Self Care Barriers to Discharge: No Barriers Identified   Patient Goals and CMS Choice     Choice offered to / list presented to : Patient  Discharge Placement                       Discharge Plan and Services                DME Arranged: 3-N-1, Walker rolling DME Agency: [employer] Date DME Agency Contacted: 03/10/20 Time DME Agency Contacted: 1102 Representative spoke with at DME Agency: Zack            Social Determinants of Health (SDOH) Interventions     Readmission Risk Interventions No flowsheet data found.

## 2020-03-10 NOTE — Evaluation (Signed)
Occupational Therapy Evaluation Patient Details Name: Susan Hopkins MRN: [id number] DOB: 1971/03/23 Today's Date: 03/10/2020    History of Present Illness Susan Hopkins is a 49 yo female with PMH diabetes and hypertension who is presenting for 1w history of progressive headache and developed 2 tonic seizures while going into the ED who was found to have PRES syndrome on head imaging and lactic acidosis. Also found to have a PE.   Clinical Impression   This 49 yo female admitted with above presents to acute OT with PLOF of being totally independent with basic ADLs, IADLs, driving, and taking care of their 4 kids. Pt currently is minguard to min A for any basic ADLs that require her to be up on her feet and needs setup for other basic ADLs. RN made aware of increase in BP. We will continue to follow with HHOT follow up recommended.  BP: supine 137/81 HR 74        Sitting 147/86  HR 76        Supine after standing to get up to [facility] 153/92  HR 77    Follow Up Recommendations  Home health OT;Supervision/Assistance - 24 hour    Equipment Recommendations  3 in 1 bedside commode       Precautions / Restrictions Precautions Precautions: Fall Restrictions Weight Bearing Restrictions: No      Mobility Bed Mobility Overal bed mobility: Modified Independent             General bed mobility comments: increased time  Transfers Overall transfer level: Needs assistance Equipment used: 1 person hand held assist Transfers: Sit to/from Stand Sit to Stand: Min guard              Balance Overall balance assessment: Needs assistance Sitting-balance support: No upper extremity supported;Feet supported Sitting balance-Leahy Scale: Good     Standing balance support: Single extremity supported Standing balance-Leahy Scale: Poor                             ADL either performed or assessed with clinical judgement   ADL Overall ADL's : Needs  assistance/impaired Eating/Feeding: Independent;Sitting   Grooming: Set up;Sitting   Upper Body Bathing: Set up;Sitting   Lower Body Bathing: Min guard;Sit to/from stand   Upper Body Dressing : Set up;Sitting   Lower Body Dressing: Min guard;Sit to/from stand   Toilet Transfer: Magazine features editor Details (indicate cue type and reason): sit>stand at EOB and side step up towards the [place] and Hygiene: Min guard;Sit to/from stand               Vision Baseline Vision/History: Wears glasses Wears Glasses: Reading only Patient Visual Report: Eye fatigue/eye pain/headache Additional Comments: Increased headache when looks up, increased dizziness when looks down            Pertinent Vitals/Pain Pain Assessment: 0-10 Pain Score: 6  Pain Location: right side of head Pain Descriptors / Indicators: Aching Pain Intervention(s): Limited activity within patient's tolerance;Monitored during session     Hand Dominance Right   Extremity/Trunk Assessment Upper Extremity Assessment Upper Extremity Assessment: Overall WFL for tasks assessed           Communication Communication Communication: No difficulties   Cognition Arousal/Alertness: Awake/alert Behavior During Therapy: WFL for tasks assessed/performed Overall Cognitive Status: Within Functional Limits for tasks assessed  Home Living Family/patient expects to be discharged to:: Private residence Living Arrangements: Spouse/significant other;Children Available Help at Discharge: Family;Available 24 hours/day(kids are 65-64 years old (4)) Type of Home: House Home Access: Stairs to enter [employer] of Steps: 5 Entrance Stairs-Rails: Right;Left Home Layout: Two level;Bed/bath upstairs   Alternate Level Stairs-Rails: Right Bathroom Shower/Tub: Walk-in shower;Door   [employer] Toilet: Standard     Home  Equipment: None          Prior Functioning/Environment Level of Independence: Independent                 OT Problem List: Decreased activity tolerance;Impaired balance (sitting and/or standing);Impaired vision/perception;Pain      OT Treatment/Interventions: Self-care/ADL training;DME and/or AE instruction;Patient/family education;Balance training;Therapeutic activities    OT Goals(Current goals can be found in the care plan section) Acute Rehab OT Goals Patient Stated Goal: to feel better OT Goal Formulation: With patient/family Time For Goal Achievement: 03/24/20 Potential to Achieve Goals: Good  OT Frequency: Min 2X/week              AM-PAC OT "6 Clicks" Daily Activity     Outcome Measure Help from another person eating meals?: None Help from another person taking care of personal grooming?: A Little Help from another person toileting, which includes using toliet, bedpan, or urinal?: A Little Help from another person bathing (including washing, rinsing, drying)?: A Little Help from another person to put on and taking off regular upper body clothing?: A Little Help from another person to put on and taking off regular lower body clothing?: A Little 6 Click Score: 19   End of Session Nurse Communication: (Pt's BP up)  Activity Tolerance: Other (comment)(limited by increase in headache (6 to 7) and increase in BP above parameters) Patient left: in bed;with call bell/phone within reach;with bed alarm set;with family/visitor present(huband)  OT Visit Diagnosis: Unsteadiness on feet (R26.81);Other abnormalities of gait and mobility (R26.89);Muscle weakness (generalized) (M62.81);Pain Pain - Right/Left: Right Pain - part of body: (head)                Time: 6789-3810 OT Time Calculation (min): 17 min Charges:  OT General Charges $OT Visit: 1 Visit OT Evaluation $OT Eval Moderate Complexity: 1 Mod  Cathy OTR/L Acute [employer] Pager [phone] Office  [phone]     03/10/2020, 9:51 AM

## 2020-03-10 NOTE — Plan of Care (Signed)

## 2020-03-10 NOTE — Progress Notes (Signed)
NAMEDanell Hopkins, MRN:  [id number], DOB:  10/18/1971, LOS: 3 ADMISSION DATE:  03/07/2020  Subjective  No overnight events. VSS Discussed need for anticoagulation for PE. Patient interested in [place]. Discussed discharge plans. She notes she already has been referred to neurology at [facility] last week by her PCP.  Objective   Blood pressure 131/87, pulse 75, temperature 98.7 F (37.1 C), temperature source Oral, resp. rate 20, height 5\' 6"  (1.676 m), weight 86.3 kg, SpO2 99 %.     Intake/Output Summary (Last 24 hours) at 03/10/2020 1420 Last data filed at 03/10/2020 0227 Gross per 24 hour  Intake 977.93 ml  Output --  Net 977.93 ml   Filed Weights   03/08/20 0328 03/09/20 0352 03/10/20 0354  Weight: 89.5 kg 89.2 kg 86.3 kg    Examination: GENERAL: in no acute distress CARDIAC: heart RRR. PULMONARY: Lung sounds clear to auscultation. NEURO: alert and oriented.   Significant Diagnostic Tests:  CTA head/neck>acute/subsacute bilateral occipital lobe infarcts. Left PE.  MRI brain> abnormal subcortical edema in occipital and parietal lobes with some frontal and right cerebellar involvement suggestive of PRES  Labs    CBC Latest Ref Rng & Units 03/10/2020 03/09/2020 03/08/2020  WBC 4.0 - 10.5 K/uL 3.6(L) 3.3(L) 5.5  Hemoglobin 12.0 - 15.0 g/dL 10.8(L) 10.0(L) 10.9(L)  Hematocrit 36.0 - 46.0 % 34.9(L) 32.3(L) 34.8(L)  Platelets 150 - 400 K/uL 368 288 295   BMP Latest Ref Rng & Units 03/10/2020 03/09/2020 03/08/2020  Glucose 70 - 99 mg/dL 109(H) 105(H) 123(H)  BUN 6 - 20 mg/dL <5(L) <5(L) <5(L)  Creatinine 0.44 - 1.00 mg/dL 0.47 0.35(L) 0.52  BUN/Creat Ratio 9 - 23 - - -  Sodium 135 - 145 mmol/L 139 141 141  Potassium 3.5 - 5.1 mmol/L 4.0 3.0(L) 3.3(L)  Chloride 98 - 111 mmol/L 111 110 109  CO2 22 - 32 mmol/L 20(L) 24 24  Calcium 8.9 - 10.3 mg/dL 8.8(L) 7.8(L) 7.8(L)    Summary  Susan Hopkins is a 49 yo female with a PMH of hypertension, diabetes and eclampsiax3 who  was admitted to IMTS for further management of PRES, seizures and PE.  Assessment & Plan:  Active Problems:   PRES (posterior reversible encephalopathy syndrome)   PRES  RESOLVED Hypertensive emergency with seizure Blood pressures improved this morning. Headache symptoms improved with one time dose of solumedrol. Stable for discharge.  Unprovoked PE. Incidentally noted on admission neck CTA. No evidence of heart strain. Echo with moderately elevated pulm artery pressure; EF 78-29%; normal diastolic dysfunction. Plan: will transition to [place] today prior to discharge.   Type 2 DM. CBGs stable. Metformin 1000mg  daily. Will resume home meds at discharge  Nephrotic syndrome. Renal function wnl. Follows at [facility]. Unclear etiology. Was scheduled for renal bx in 1-2w. Discussed bx with Dr. Audie Clear, her primary nephrologist at [facility]. She would like to have US obtain the bx here prior to starting her on a DOAC for long term anticoagulation for her unprovoked PE. Pt was in agreement with this initially however changed her mind yesterday afternoon and decided she was too tired and didn't wish to have this done at this time. Pt re-educated on reasoning however still refusing. Seen by IR this morning however pt refused. When our team came by a couple hours later, she said she wanted to get it done. I did call and discuss trying to reschedule her today however, unfortunately, she would have to wait around until Monday to get it  done. Overall, she is stable for discharge and given here indecisiveness about getting the biopsy, I can not really see the benefit of keeping her another couple days for an elective procedure. Pt understands this and will follow up with her outpatient nephrologist to discuss the biopsy moving forward.  Chronic microcytic iron deficient anemia. In the setting of nephrotic syndrome. Should follow up with primary regarding this.  Best practice:  CODE STATUS: full DVT for prophylaxis:  transition to xarelto today Dispo: stable for d/c today. Follow up with nephrology and neurology outpatient.   Elige Radon, MD INTERNAL MEDICINE RESIDENT PGY-1 PAGER #: [phone] 03/10/20  2:20 PM

## 2020-03-10 NOTE — Progress Notes (Signed)
ANTICOAGULATION CONSULT NOTE - Follow Up Consult  Pharmacy Consult for Heparin Indication: pulmonary embolus  Allergies  Allergen Reactions  . Relpax [Eletriptan] Shortness Of Breath    Throat swelling  . Oxycodone Itching    All over the body  . Phenergan [Promethazine Hcl]     Restless, anxious    Patient Measurements: Height: 5\' 6"  (167.6 cm) Weight: 190 lb 4.1 oz (86.3 kg) IBW/kg (Calculated) : 59.3 Heparin Dosing Weight: 77 kg  Vital Signs: Temp: 98.7 F (37.1 C) (03/26 1129) Temp Source: Oral (03/26 1129) BP: 131/87 (03/26 1129) Pulse Rate: 75 (03/26 1129)  Labs: Recent Labs    03/07/20 2057 03/07/20 2057 03/08/20 0023 03/08/20 0023 03/08/20 0527 03/08/20 0547 03/08/20 2006 03/09/20 0314 03/10/20 0521  HGB  --   --  10.9*   < >  --   --   --  10.0* 10.8*  HCT  --   --  34.8*  --   --   --   --  32.3* 34.9*  PLT  --   --  295  --   --   --   --  288 368  HEPARINUNFRC <0.10*   < > 0.13*  --   --    < > 0.37 0.37 0.48  CREATININE 0.50   < >  --   --  0.52  --   --  0.35* 0.47  CKTOTAL 93  --   --   --   --   --   --   --   --    < > = values in this interval not displayed.    Estimated Creatinine Clearance: 93.1 mL/min (by C-G formula based on SCr of 0.47 mg/dL).  Assessment:  12 YOF presenting with AMS and possible seizure activity , found with L-pulm artery embolus on imaging. Not on anticoagulation PTA. Also r/o for stroke with MRI - findings most consistent with PRES.    Heparin level remains therapeutic (0.48) on 1350 units/hr.      Prior plans for renal biopsy at [facility], then planning here, prior to starting oral anticoagulant, but IR note indicates patient declines and requests to have done as outpatient at a later date.  Goal of Therapy:  Heparin level 0.3-0.5 units/ml Monitor platelets by anticoagulation protocol: Yes   Plan:   Continue heparin drip at 1350 units/hr  Daily heparin level and CBC.  Monitor for s/sx bleeding.  Follow up  plans.   [facility], Dennie Fetters Phone: [phone] 03/10/2020,12:26 PM

## 2020-03-10 NOTE — Evaluation (Signed)
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 Also found to have a PE.  Clinical Impression  Pt in bed upon arrival of PT, agreeable to evaluation at this time. 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 The pt now presents with limitations in functional mobility, dynamic stability, and activity tolerance due to above dx, and will continue to benefit from skilled PT to address these deficits. The pt was able to demo good stability with transfers, but requires minA for ambulation due to instability and increased lateral movement. The pt will benefit from use of RW for short term to increase stability. The pt remains limited in full activity by BP limits set for <140/<90 as BP increased with mobility as reported below. The pt was educated on tracking her sx and strategic placement of chairs around home for rest breaks to improve safety with ambulation in home.  The pt will continue to benefit from skilled PT to progress functional activity tolerance and independence, but is safe to return home with supervision from family.      Follow Up Recommendations No PT follow up;Supervision/Assistance - 24 hour    Equipment Recommendations  Rolling walker with 5" wheels    Recommendations for Other Services       Precautions / Restrictions Precautions Precautions: Fall Precaution Comments: watch BP (orders <140/<90) Restrictions Weight Bearing Restrictions: No      Mobility  Bed Mobility Overal bed mobility: Modified Independent             General bed mobility comments: increased  time  Transfers Overall transfer level: Needs assistance Equipment used: None Transfers: Sit to/from Stand Sit to Stand: Min guard            Ambulation/Gait Ambulation/Gait assistance: Min guard;Min assist Gait Distance (Feet): 15 Feet Assistive device: 1 person hand held assist;None Gait Pattern/deviations: Step-through pattern;Decreased stride length;Drifts right/left;Trunk flexed   Gait velocity interpretation: <1.31 ft/sec, indicative of household ambulator General Gait Details: pt with slow, increased lateral movement, no LOB but minor drift with need for minA to stabilize. pt reaching for objects to steady  Stairs            Wheelchair Mobility    Modified Rankin (Stroke Patients Only)       Balance Overall balance assessment: Needs assistance Sitting-balance support: No upper extremity supported;Feet supported Sitting balance-Leahy Scale: Good     Standing balance support: Single extremity supported Standing balance-Leahy Scale: Poor                               Pertinent Vitals/Pain Pain Assessment: 0-10 Pain Score: 1  Pain Location: right side of head Pain Descriptors / Indicators: Aching;Headache Pain Intervention(s): Limited activity within patient's tolerance;Monitored during session    Home Living Family/patient expects to be discharged to:: Private residence Living Arrangements: Spouse/significant other;Children(4 kids, age 42 - 55) Available Help at Discharge: Family;Available 24 hours/day Type of Home: House Home Access: Stairs to enter Entrance Stairs-Rails: Psychiatric nurse of Steps: 5 Home Layout: Two level;Bed/bath upstairs Home Equipment: None  Prior Function Level of Independence: Independent         Comments: not working, but driving, 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 Dominance   Dominant Hand: Right    Extremity/Trunk Assessment   Upper Extremity  Assessment Upper Extremity Assessment: Overall WFL for tasks assessed    Lower Extremity Assessment Lower Extremity Assessment: Overall WFL for tasks assessed    Cervical / Trunk Assessment Cervical / Trunk Assessment: Normal  Communication   Communication: No difficulties  Cognition Arousal/Alertness: Awake/alert Behavior During Therapy: WFL for tasks assessed/performed;Flat affect Overall Cognitive Status: Within Functional Limits for tasks assessed                                        General Comments General comments (skin integrity, edema, etc.): BPs: 131/80 (95) supine; 141/91 (83) sitting EOB; 129/96 (106) sitting EOB after 5 min; 134/85 after scooting up to [facility]; 145/85 after short walk in room    Exercises     Assessment/Plan    PT Assessment Patient needs continued PT services  PT Problem List Decreased mobility;Decreased activity tolerance;Decreased coordination;Decreased balance       PT Treatment Interventions DME instruction;Gait training;Balance training;Therapeutic exercise;Stair training;Functional mobility training;Therapeutic activities;Patient/family education    PT Goals (Current goals can be found in the Care Plan section)  Acute Rehab PT Goals Patient Stated Goal: to feel better PT Goal Formulation: With patient Time For Goal Achievement: 03/24/20 Potential to Achieve Goals: Good    Frequency Min 3X/week   Barriers to discharge        Co-evaluation               AM-PAC PT "6 Clicks" Mobility  Outcome Measure Help needed turning from your back to your side while in a flat bed without using bedrails?: None Help needed moving from lying on your back to sitting on the side of a flat bed without using bedrails?: None Help needed moving to and from a bed to a chair (including a wheelchair)?: A Little Help needed standing up from a chair using your arms (e.g., wheelchair or bedside chair)?: A Little Help needed to walk in  hospital room?: A Little Help needed climbing 3-5 steps with a railing? : A Little 6 Click Score: 20    End of Session Equipment Utilized During Treatment: Gait belt Activity Tolerance: Patient tolerated treatment well;Treatment limited secondary to medical complications (Comment)(BP orders for <140/<90\) Patient left: in bed;with family/visitor present;with call bell/phone within reach Nurse Communication: Mobility status(BPs) PT Visit Diagnosis: Unsteadiness on feet (R26.81);Difficulty in walking, not elsewhere classified (R26.2)    Time: 1610-9604 PT Time Calculation (min) (ACUTE ONLY): 33 min   Charges:   PT Evaluation $PT Eval Moderate Complexity: 1 Mod PT Treatments $Gait Training: 8-22 mins        Rolm Baptise, PT, DPT   Acute Rehabilitation Department Pager #: [phone]  [name] 03/10/2020, 2:42 PM

## 2020-03-10 NOTE — Progress Notes (Signed)
Patient is discharging home. Husband here for transportation home. Equipment delivered to the room. All discharge paperwork went over with patient and husband, all questions and concerns addressed. All belongings sent with patient. Patient taken down in wheelchair. Susan Hopkins

## 2020-03-11 ENCOUNTER — Encounter (HOSPITAL_BASED_OUTPATIENT_CLINIC_OR_DEPARTMENT_OTHER): Payer: Self-pay | Admitting: Emergency Medicine

## 2020-03-11 ENCOUNTER — Emergency Department (HOSPITAL_BASED_OUTPATIENT_CLINIC_OR_DEPARTMENT_OTHER)
Admission: EM | Admit: 2020-03-11 | Discharge: 2020-03-11 | Disposition: A | Discharge status: Home / Self Care | Payer: BLUE CROSS/BLUE SHIELD | Attending: Emergency Medicine | Admitting: Emergency Medicine

## 2020-03-11 ENCOUNTER — Emergency Department (HOSPITAL_BASED_OUTPATIENT_CLINIC_OR_DEPARTMENT_OTHER): Payer: BLUE CROSS/BLUE SHIELD

## 2020-03-11 ENCOUNTER — Encounter (HOSPITAL_BASED_OUTPATIENT_CLINIC_OR_DEPARTMENT_OTHER): Payer: Self-pay | Admitting: Oncology

## 2020-03-11 ENCOUNTER — Emergency Department (HOSPITAL_BASED_OUTPATIENT_CLINIC_OR_DEPARTMENT_OTHER)
Admission: EM | Admit: 2020-03-11 | Discharge: 2020-03-12 | Discharge status: Against Medical Advice | Payer: BLUE CROSS/BLUE SHIELD | Source: Home / Self Care

## 2020-03-11 ENCOUNTER — Other Ambulatory Visit: Payer: Self-pay

## 2020-03-11 DIAGNOSIS — Z79899 Other long term (current) drug therapy: Secondary | ICD-10-CM | POA: Diagnosis not present

## 2020-03-11 DIAGNOSIS — Z7984 Long term (current) use of oral hypoglycemic drugs: Secondary | ICD-10-CM | POA: Diagnosis not present

## 2020-03-11 DIAGNOSIS — E119 Type 2 diabetes mellitus without complications: Secondary | ICD-10-CM | POA: Diagnosis not present

## 2020-03-11 DIAGNOSIS — R519 Headache, unspecified: Secondary | ICD-10-CM

## 2020-03-11 DIAGNOSIS — I6783 Posterior reversible encephalopathy syndrome: Secondary | ICD-10-CM | POA: Diagnosis not present

## 2020-03-11 DIAGNOSIS — Z885 Allergy status to narcotic agent status: Secondary | ICD-10-CM | POA: Diagnosis not present

## 2020-03-11 DIAGNOSIS — Z888 Allergy status to other drugs, medicaments and biological substances status: Secondary | ICD-10-CM | POA: Diagnosis not present

## 2020-03-11 LAB — CBC WITH DIFFERENTIAL/PLATELET
Abs Immature Granulocytes: 0.1 10*3/uL — ABNORMAL HIGH (ref 0.00–0.07)
Basophils Absolute: 0 10*3/uL (ref 0.0–0.1)
Basophils Relative: 0 %
Eosinophils Absolute: 0 10*3/uL (ref 0.0–0.5)
Eosinophils Relative: 0 %
HCT: 38.8 % (ref 36.0–46.0)
Hemoglobin: 12.1 g/dL (ref 12.0–15.0)
Immature Granulocytes: 2 %
Lymphocytes Relative: 27 %
Lymphs Abs: 1.6 10*3/uL (ref 0.7–4.0)
MCH: 24 pg — ABNORMAL LOW (ref 26.0–34.0)
MCHC: 31.2 g/dL (ref 30.0–36.0)
MCV: 76.8 fL — ABNORMAL LOW (ref 80.0–100.0)
Monocytes Absolute: 0.1 10*3/uL (ref 0.1–1.0)
Monocytes Relative: 2 %
Neutro Abs: 4 10*3/uL (ref 1.7–7.7)
Neutrophils Relative %: 69 %
Platelets: 454 10*3/uL — ABNORMAL HIGH (ref 150–400)
RBC: 5.05 MIL/uL (ref 3.87–5.11)
RDW: 16.6 % — ABNORMAL HIGH (ref 11.5–15.5)
WBC: 5.8 10*3/uL (ref 4.0–10.5)
nRBC: 0 % (ref 0.0–0.2)

## 2020-03-11 LAB — BASIC METABOLIC PANEL
Anion gap: 12 (ref 5–15)
BUN: 16 mg/dL (ref 6–20)
CO2: 19 mmol/L — ABNORMAL LOW (ref 22–32)
Calcium: 8.7 mg/dL — ABNORMAL LOW (ref 8.9–10.3)
Chloride: 104 mmol/L (ref 98–111)
Creatinine, Ser: 0.59 mg/dL (ref 0.44–1.00)
GFR calc Af Amer: >60 mL/min (ref >60)
GFR calc non Af Amer: >60 mL/min (ref >60)
Glucose, Bld: 237 mg/dL — ABNORMAL HIGH (ref 70–99)
Potassium: 3.9 mmol/L (ref 3.5–5.1)
Sodium: 135 mmol/L (ref 135–145)

## 2020-03-11 MED ORDER — LABETALOL HCL 5 MG/ML IV SOLN
20.0000 mg | Freq: Once | INTRAVENOUS | Status: AC
  Administered 2020-03-11: 20 mg via INTRAVENOUS
  Filled 2020-03-11: qty 4

## 2020-03-11 MED ORDER — DIPHENHYDRAMINE HCL 50 MG/ML IJ SOLN
25.0000 mg | Freq: Once | INTRAMUSCULAR | Status: AC
  Administered 2020-03-11: 25 mg via INTRAVENOUS
  Filled 2020-03-11: qty 1

## 2020-03-11 MED ORDER — MORPHINE SULFATE (PF) 4 MG/ML IV SOLN
4.0000 mg | Freq: Once | INTRAVENOUS | Status: AC
  Administered 2020-03-11: 4 mg via INTRAVENOUS
  Filled 2020-03-11: qty 1

## 2020-03-11 MED ORDER — DEXAMETHASONE SODIUM PHOSPHATE 10 MG/ML IJ SOLN
10.0000 mg | Freq: Once | INTRAMUSCULAR | Status: AC
  Administered 2020-03-11: 10 mg via INTRAVENOUS
  Filled 2020-03-11: qty 1

## 2020-03-11 MED ORDER — METOCLOPRAMIDE HCL 5 MG/ML IJ SOLN
10.0000 mg | Freq: Once | INTRAMUSCULAR | Status: AC
  Administered 2020-03-11: 10 mg via INTRAVENOUS
  Filled 2020-03-11: qty 2

## 2020-03-11 NOTE — ED Provider Notes (Addendum)
[facility] Provider Note: Lowella Dell, MD, FACEP  CSN: [id number] MRN: [id number] ARRIVAL: 03/11/20 at 0233 ROOM: MH09/MH09   CHIEF COMPLAINT  Headache  Level 5 caveat: Severe pain, poor cooperation HISTORY OF PRESENT ILLNESS  03/11/20 2:57 AM Susan Hopkins is a 49 y.o. female with chronic headaches who was seen in the ED for severe headache on 03/03/2020 and again on 03/04/2020.  On this visit she was thought to have a migraine and responded to conventional migraine cocktails.  A CT of the head on 03/03/2020 was unremarkable.  She was seen again on 03/07/2020 for headache, vision loss and altered level of consciousness.  She also had some suspected seizure activity.  A code stroke was called.  She was ultimately diagnosed with PRES despite only mildly elevated blood pressures.  She was also started on Xarelto for an incidental pulmonary embolism noted on cervical CT angiogram.  This was thought possibly due to nephrotic syndrome (proteinuria with normal BUN and creatinine).  She was discharged home from [facility] yesterday and did not take her most recent scheduled dose of antihypertensives.  She returns with a severe headache (10 out of 10) that began approximately 30 minutes prior to arrival. The headache is generalized and is associated with vomiting and photophobia. She also complains of generalized weakness and is moaning and difficult to obtain a history from. Nursing staff reports patient able to move all extremities against gravity but with apparent poor effort.   Past Medical History:  Diagnosis Date  . ADVANCED MATERNAL AGE 66/02/2008  . ANEMIA, IRON DEFICIENCY, UNSPEC. 02/12/2007   after IUD  . Constipation   . Contact dermatitis 08/27/2011  . Diabetes mellitus without complication (HCC)    gestational diab. currently & with infant #2  . DIABETES MELLITUS, GESTATIONAL, HX OF 05/18/2008  . Diabetes mellitus, type II (HCC)    Hbg A1C 6.9 after pregancy, now on  Metformin  . Diarrhea   . 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 Gastric ulcer    history of  . GERD (gastroesophageal reflux disease)    doesn't take anything for acid reflux  . Gestational diabetes A2/B 07/20/2012   Fetal echo-nml-under media tab, needs baseline labs-nml with nml TSH and 24 hour urine-68mg . To see optho in Nov 2013-Per pt. Ok, serial u/s for growth, 2x/wk testing at 32 wks.   . Hemorrhoids   . 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 IUD complication (HCC) 07/28/2015  . Migraine headache    last migraine in Sept 2012    Past Surgical History:  Procedure Laterality Date  . CESAREAN SECTION N/A 01/22/2013   Procedure: CESAREAN SECTION;  Surgeon: Reva Bores, MD;  Location: [facility];  Service: Obstetrics;  Laterality: N/A;  Repeat  . CESAREAN SECTION  2005  . CESAREAN SECTION  2010  . CHOLECYSTECTOMY  11/28/2011   Procedure: LAPAROSCOPIC CHOLECYSTECTOMY WITH INTRAOPERATIVE CHOLANGIOGRAM;  Surgeon: Wilmon Arms. Corliss Skains, MD;  Location: [facility] OR;  Service: General;  Laterality: N/A;  . DILATION AND CURETTAGE OF UTERUS  2009  . female circumcision  12  . TONSILLECTOMY  1989    Family History  Problem Relation Age of Onset  . Diabetes Mother   . Hypertension Mother   . Hyperlipidemia Mother   . Hypertension Father   . Hyperlipidemia Father   . Prostate cancer Father   . Diabetes Father   . Anesthesia problems Neg Hx   . Hypotension Neg Hx   .  Malignant hyperthermia Neg Hx   . Pseudochol deficiency Neg Hx     Social History   Tobacco Use  . Smoking status: Never Smoker  . Smokeless tobacco: Never Used  Substance Use Topics  . Alcohol use: No  . Drug use: No    Prior to Admission medications   Medication Sig Start Date End Date Taking? Authorizing Provider  albuterol (VENTOLIN HFA) 108 (90 Base) MCG/ACT inhaler Inhale 2 puffs into the lungs every 6 (six) hours as needed for wheezing or shortness of breath.     [provider]  amLODipine (NORVASC) 5 MG tablet Take 1 tablet (5 mg total) by mouth daily. 03/11/20   Elige Radon, MD  cetirizine (ZYRTEC) 10 MG tablet Take 10 mg by mouth as needed for allergies.    [provider]  furosemide (LASIX) 20 MG tablet Take 40 mg by mouth daily.  10/27/19   [provider]  hydrochlorothiazide (HYDRODIURIL) 25 MG tablet Take 1 tablet (25 mg total) by mouth daily. 03/11/20   Elige Radon, MD  levETIRAcetam (KEPPRA) 500 MG tablet Take 1 tablet (500 mg total) by mouth 2 (two) times daily. 03/10/20   Elige Radon, MD  levothyroxine (SYNTHROID) 50 MCG tablet Take 50 mcg by mouth daily. 12/30/19   [provider]  losartan (COZAAR) 100 MG tablet Take 100 mg by mouth daily. 01/26/20   [provider]  metFORMIN (GLUCOPHAGE) 1000 MG tablet Take 1,000 mg by mouth daily.  12/30/19   [provider]  propranolol ER (INDERAL LA) 60 MG 24 hr capsule Take 1 capsule (60 mg total) by mouth at bedtime. 02/09/20   Lomax, Amy, NP  Rivaroxaban 15 & 20 MG TBPK Follow package directions: Take one 15mg  tablet by mouth twice a day. On day 22, switch to one 20mg  tablet once a day. Take with food. 03/10/20   , MD  spironolactone (ALDACTONE) 25 MG tablet Take 1 tablet (25 mg total) by mouth daily. 03/11/20   Christian, Rylee, MD  tiZANidine (ZANAFLEX) 4 MG tablet TAKE 1 TABLET (4 MG TOTAL) BY MOUTH EVERY 6 (SIX) HOURS AS NEEDED FOR MUSCLE SPASMS. 08/06/19   03/13/20, MD  traMADol (ULTRAM) 50 MG tablet TAKE 1 TO 2 TABLETS EVERY 6 HOURS FOR PAIN Patient taking differently: Take 50-100 mg by mouth every 6 (six) hours.  02/09/20   Lomax, Amy, NP  Vitamin D, Ergocalciferol, (DRISDOL) 50000 units CAPS capsule TAKE 1 CAPSULE BY MOUTH ONCE A WEEK. FOR 8 WEEKS Patient taking differently: Take 50,000 Units by mouth every 7 (seven) days. Sundays 07/17/18   Diallo, 03-30-1976, MD  zolpidem (AMBIEN) 5 MG tablet Take 1 tablet (5 mg  total) by mouth at bedtime as needed for sleep. 03/04/20   Lilia Argue, MD    Allergies Relpax [eletriptan], Oxycodone, and Phenergan [promethazine hcl]   REVIEW OF SYSTEMS  Negative except as noted here or in the History of Present Illness.   PHYSICAL EXAMINATION  Initial Vital Signs Blood pressure (!) 179/89, pulse 69, temperature 99.7 F (37.6 C), temperature source Oral, resp. rate 17, height 5\' 6"  (1.676 m), weight 86.2 kg, SpO2 98 %.  Examination General: Well-developed, well-nourished female in no acute distress; appearance consistent with age of record HENT: normocephalic; atraumatic Eyes: pupils equal, round and reactive to light Neck: supple Heart: regular rate and rhythm Lungs: clear to auscultation bilaterally Abdomen: soft; nondistended; nontender; bowel sounds present Extremities: No deformity; full range of motion; pulses normal Neurologic: Awake, alert;  apparent symmetric but generalized weakness versus poor effort; no facial droop Skin: Warm and dry Psychiatric: Moaning, poor cooperation with examination   RESULTS  Summary of this visit's results, reviewed and interpreted by myself:   EKG Interpretation  Date/Time:    Ventricular Rate:    PR Interval:    QRS Duration:   QT Interval:    QTC Calculation:   R Axis:     Text Interpretation:        Laboratory Studies: Results for orders placed or performed during the hospital encounter of 03/11/20 (from the past 24 hour(s))  CBC with Differential/Platelet     Status: Abnormal   Collection Time: 03/11/20  2:48 AM  Result Value Ref Range   WBC 5.8 4.0 - 10.5 K/uL   RBC 5.05 3.87 - 5.11 MIL/uL   Hemoglobin 12.1 12.0 - 15.0 g/dL   HCT 38.8 36.0 - 46.0 %   MCV 76.8 (L) 80.0 - 100.0 fL   MCH 24.0 (L) 26.0 - 34.0 pg   MCHC 31.2 30.0 - 36.0 g/dL   RDW 16.6 (H) 11.5 - 15.5 %   Platelets 454 (H) 150 - 400 K/uL   nRBC 0.0 0.0 - 0.2 %   Neutrophils Relative % 69 %   Neutro Abs 4.0 1.7 - 7.7 K/uL    Lymphocytes Relative 27 %   Lymphs Abs 1.6 0.7 - 4.0 K/uL   Monocytes Relative 2 %   Monocytes Absolute 0.1 0.1 - 1.0 K/uL   Eosinophils Relative 0 %   Eosinophils Absolute 0.0 0.0 - 0.5 K/uL   Basophils Relative 0 %   Basophils Absolute 0.0 0.0 - 0.1 K/uL   Immature Granulocytes 2 %   Abs Immature Granulocytes 0.10 (H) 0.00 - 0.07 K/uL  Basic metabolic panel     Status: Abnormal   Collection Time: 03/11/20  2:48 AM  Result Value Ref Range   Sodium 135 135 - 145 mmol/L   Potassium 3.9 3.5 - 5.1 mmol/L   Chloride 104 98 - 111 mmol/L   CO2 19 (L) 22 - 32 mmol/L   Glucose, Bld 237 (H) 70 - 99 mg/dL   BUN 16 6 - 20 mg/dL   Creatinine, Ser 0.59 0.44 - 1.00 mg/dL   Calcium 8.7 (L) 8.9 - 10.3 mg/dL   GFR calc non Af Amer >60 >60 mL/min   GFR calc Af Amer >60 >60 mL/min   Anion gap 12 5 - 15   Imaging Studies: CT Head Wo Contrast  Result Date: 03/11/2020 CLINICAL DATA:  Headache recent breath EXAM: CT HEAD WITHOUT CONTRAST TECHNIQUE: Contiguous axial images were obtained from the base of the skull through the vertex without intravenous contrast. COMPARISON:  MRI March 07, 2020 FINDINGS: Brain: No evidence of acute territorial infarction, hemorrhage, hydrocephalus,extra-axial collection or mass lesion/mass effect. Again noted is subtle hypodensity within the posterior bilateral occipital lobes which appears to be less pronounced than on the prior exam. Normal gray-white differentiation seen throughout the remainder of the brain. Vascular: No hyperdense vessel or unexpected calcification. Skull: The skull is intact. No fracture or focal lesion identified. Sinuses/Orbits: The visualized paranasal sinuses and mastoid air cells are clear. The orbits and globes intact. Other: None IMPRESSION: Findings suggestive of resolving posterior reversible encephalopathy syndrome as seen on recent MRI. No new acute intracranial pathology. Electronically Signed   By: Prudencio Pair M.D.   On: 03/11/2020 03:26     ED COURSE and MDM  Nursing notes, initial and subsequent vitals signs,  including pulse oximetry, reviewed and interpreted by myself.  Vitals:   03/11/20 0445 03/11/20 0500 03/11/20 0515 03/11/20 0530  BP: 138/74 (!) 145/70 140/80 (!) 145/77  Pulse: 82 77 75 72  Resp: 20 (!) 21 20 14   Temp:      TempSrc:      SpO2: 99% 100% 95% 98%  Weight:      Height:       Medications  dexamethasone (DECADRON) injection 10 mg (has no administration in time range)  morphine 4 MG/ML injection 4 mg (has no administration in time range)  labetalol (NORMODYNE) injection 20 mg (20 mg Intravenous Given 03/11/20 0317)  diphenhydrAMINE (BENADRYL) injection 25 mg (25 mg Intravenous Given 03/11/20 0317)  metoCLOPramide (REGLAN) injection 10 mg (10 mg Intravenous Given 03/11/20 0317)  morphine 4 MG/ML injection 4 mg (4 mg Intravenous Given 03/11/20 0507)   6:08 AM Patient's blood pressure is now 147/79 after IV labetalol.  We will avoid giving additional beta labetalol to prevent hypotension.  Her headache is improved and she is no longer moaning and is able to converse more appropriately though some pain persists.  We will give an additional dose of morphine prior to discharge.  We will also give a dose of dexamethasone as steroids (IV Solu-Medrol)  did significantly improve her headaches as an inpatient; Toradol was not given in light of her new anticoagulation as well as ongoing nephrotic syndrome.  Her CT scan shows resolving PRES and no evidence of hemorrhage despite the patient being started on anticoagulation.  It was emphasized she needs to take her antihypertensives on a regular basis.  She also needs to follow-up for continued treatment of her nephrotic syndrome.   PROCEDURES  Procedures   ED DIAGNOSES     ICD-10-CM   1. PRES (posterior reversible encephalopathy syndrome)  I67.83   2. Severe headache  R51.9        Itzy Adler, 03/13/20, MD 03/11/20 03/13/20    0350, MD 03/11/20 [phone]

## 2020-03-11 NOTE — Discharge Summary (Signed)
Name: Susan Hopkins MRN: [id number] DOB: 1971-03-19 49 y.o. PCP: Susan Damme, MD  Date of Admission: 03/11/2020  2:41 AM Date of Discharge: 03/11/2020 Attending Physician: Dr. Heber Hopkins  Discharge Diagnosis: 1. Hypertensive emergency with seizure 2. PRES 3. Pulmonary Embolism 4. Nephrotic syndrome 5. Chronic headache  Discharge Medications: Allergies as of 03/11/2020      Reactions   Relpax [eletriptan] Shortness Of Breath   Throat swelling   Oxycodone Itching   All over the body   Phenergan [promethazine Hcl]    Restless, anxious      Medication List    TAKE these medications   albuterol 108 (90 Base) MCG/ACT inhaler Commonly known as: VENTOLIN HFA Inhale 2 puffs into the lungs every 6 (six) hours as needed for wheezing or shortness of breath.   amLODipine 5 MG tablet Commonly known as: NORVASC Take 1 tablet (5 mg total) by mouth daily.   cetirizine 10 MG tablet Commonly known as: ZYRTEC Take 10 mg by mouth as needed for allergies.   furosemide 20 MG tablet Commonly known as: LASIX Take 40 mg by mouth daily.   hydrochlorothiazide 25 MG tablet Commonly known as: HYDRODIURIL Take 1 tablet (25 mg total) by mouth daily.   levETIRAcetam 500 MG tablet Commonly known as: KEPPRA Take 1 tablet (500 mg total) by mouth 2 (two) times daily.   levothyroxine 50 MCG tablet Commonly known as: SYNTHROID Take 50 mcg by mouth daily.   losartan 100 MG tablet Commonly known as: COZAAR Take 100 mg by mouth daily.   metFORMIN 1000 MG tablet Commonly known as: GLUCOPHAGE Take 1,000 mg by mouth daily.   propranolol ER 60 MG 24 hr capsule Commonly known as: INDERAL LA Take 1 capsule (60 mg total) by mouth at bedtime.   Rivaroxaban 15 & 20 MG Tbpk Follow package directions: Take one 15mg  tablet by mouth twice a day. On day 22, switch to one 20mg  tablet once a day. Take with food.   spironolactone 25 MG tablet Commonly known as: ALDACTONE Take 1 tablet (25 mg total)  by mouth daily.   tiZANidine 4 MG tablet Commonly known as: ZANAFLEX TAKE 1 TABLET (4 MG TOTAL) BY MOUTH EVERY 6 (SIX) HOURS AS NEEDED FOR MUSCLE SPASMS.   traMADol 50 MG tablet Commonly known as: ULTRAM TAKE 1 TO 2 TABLETS EVERY 6 HOURS FOR PAIN What changed:   how much to take  how to take this  when to take this  additional instructions   Vitamin D (Ergocalciferol) 1.25 MG (50000 UNIT) Caps capsule Commonly known as: DRISDOL TAKE 1 CAPSULE BY MOUTH ONCE A WEEK. FOR 8 WEEKS What changed: See the new instructions.   zolpidem 5 MG tablet Commonly known as: AMBIEN Take 1 tablet (5 mg total) by mouth at bedtime as needed for sleep.       Disposition and follow-up:   [name] was discharged from [facility] in Stable condition.  At the hospital follow up visit please address:  1.  Hypertensive emergency/PRES/seizures. Patient admitted to missing several doses of her antihypertensives on days leading up to hospital admission. Suspect this is the cause. Blood pressures trending well once placed back on home medication doses. Additionally, she was placed on keppra for 2 witnessed seizures. She is recommended to continue this until re-evaluated by neurology after discharge.   2. Pulmonary Embolism. Incidentally noted on neck CTA on admission. Possibly related to her nephrotic syndrome. Initially placed on heparin at time of finding but  transitioned to xarelto at time of hospital discharge.  3. Nephrotic syndrome. This is being worked up by her primary nephrologist at [facility], Dr. Elenore Hopkins, who was planning on renal biopsy next week. Discussed new DOAC requirement with Dr. Elenore Hopkins, inquiring about if she wanted [place] to obtain the bx prior to starting Xarelto. She thought this would be helpful in avoiding a delay in the biopsy as it would otherwise have to be done after DOAC course completion. Initially patient had agreed to the procedure but she ultimately refused  so the biopsy will still need to be done at a later time.  4. Chronic Headaches. Previously was seen at [facility] however has not been seen there for awhile. Pt complained of headache throughout the majority of her hospitalization and failed several different treatments. She takes Fioricet at home however was hesitant to give this during a hospitalization for hypertensive emergency given it's caffeine content. Headache improved on day of discharge with a one time dose of 500mg  solumedrol. She will need neurology follow up for this issue as well.  2.  Labs / imaging needed at time of follow-up: BMP  Follow-up Appointments: PCP, neurology, nephrology   Hospital Course: Susan Hopkins is a 49 yo female with a PMH of hypertension and diabetes who presented to [facility] ED on 03/10/20 for progressive headache. On her way into the hospital, she underwent 2 witnessed tonic seizures.   Head imaging revealed findings consistent with PRES syndrome. Blood pressures on arrival were reportedly in the 200s systolically. Patient was later able to relay that she had missed a few of her home antihypertensive medication doses in the days leading up to the incident suggesting this as the cause of her hypertensive emergency. She was started on Keppra 500mg  bid for her seizures. Her home medications were restarted with subsequent improvement in blood pressures.   Hospitalization was complicated by a PE incidentally noted on a neck CTA that was obtained on admission. She was placed on a heparin drip at the time of the finding and remained on it until the day of discharge when she was transitioned to xarelto. She was asymptomatic and cardiac workup was negative for signs of heart strain. I suspect the cause of the clot is from her nephrotic syndrome. She denied prior history of blood clots. Aside from nephrotic syndrome, she did not have any significant risk factors besides being mildly overweight. She had not been  immobile or have any recent surgeries.   Her hospitalization was extended by one day due to attempting to obtain the renal biopsy her nephrologist had planned on for the week following hospital discharge. We were attempting to get this before transitioning her to the DOAC which she would likely require for an extended period of time, if not indefinitely.  Initially, she was agreeable to the procedure however changed her mind multiple times and refused IR on the morning of discharge. When we saw her later that day, she said she would agree to it, but due to refusing that morning to IR, they no longer had room in their schedule for her that day and she would have to wait over the weekend until Monday. She was otherwise stable for discharge so it was explained to her that she would need to follow up with her primary nephrologist to discuss future plans for biopsy.  Discharge Vitals:   BP (!) 147/79    Pulse 83    Temp 98.1 F (36.7 C) (Oral)  Resp 14    Ht 5\' 6"  (1.676 m)    Wt 86.2 kg    SpO2 99%    BMI 30.67 kg/m   Pertinent Labs, Studies, and Procedures:  CTA head/neck>acute/subsacute bilateral occipital lobe infarcts. Left PE.  MRI brain> abnormal subcortical edema in occipital and parietal lobes with some frontal and right cerebellar involvement suggestive of PRES  Echocardiogram> LVEF 60-65%. Mild LVH. Normal LV diastolic parameters. Normal right ventricular systolic function. Moderately elevated pulmonary artery systolic pressure. No mitral valve abnormalities. Moderate tricuspid regurgitation. Mild AV regurgitation.    Signed , MD 03/11/2020, 7:02 AM   Pager: [phone]

## 2020-03-11 NOTE — Discharge Summary (Deleted)
  The note originally documented on this encounter has been moved the the encounter in which it belongs.  

## 2020-03-11 NOTE — ED Triage Notes (Addendum)
Migraine onset today at 2200. Pt has hx of same. PT moaning in triage. Discharged from [facility] 3/23. States emesis times 1 pta. PT seen in at [facility] ED early am today.

## 2020-03-11 NOTE — ED Triage Notes (Signed)
Per pt's husband pt was released from hospital 03/10/20 at 1700.  Developed sever HA approximately 30 minutes PTA.  Pt weak and moaning at this time.

## 2020-03-11 NOTE — ED Notes (Signed)
Pt transported to CT with RN

## 2020-03-13 ENCOUNTER — Ambulatory Visit: Payer: BLUE CROSS/BLUE SHIELD | Admitting: Family Medicine

## 2020-03-13 ENCOUNTER — Other Ambulatory Visit: Payer: Self-pay

## 2020-03-13 ENCOUNTER — Encounter: Payer: Self-pay | Admitting: Family Medicine

## 2020-03-13 VITALS — BP 122/84 | HR 74 | Temp 97.4°F | Ht 66.0 in | Wt 181.0 lb

## 2020-03-13 DIAGNOSIS — G43009 Migraine without aura, not intractable, without status migrainosus: Secondary | ICD-10-CM | POA: Diagnosis not present

## 2020-03-13 DIAGNOSIS — I6783 Posterior reversible encephalopathy syndrome: Secondary | ICD-10-CM

## 2020-03-13 DIAGNOSIS — N049 Nephrotic syndrome with unspecified morphologic changes: Secondary | ICD-10-CM

## 2020-03-13 MED ORDER — LEVETIRACETAM 500 MG PO TABS: 500.0000 mg | ORAL_TABLET | Freq: Two times a day (BID) | ORAL | 2 refills | Status: DC

## 2020-03-13 NOTE — Patient Instructions (Addendum)
We will continue propranolol and levetiracetam.   Use tizanidine and tramadol sparingly for migraine abortion.  Follow up closely with PCP and nephrology   Follow up with me in 3 months  Migraine Headache A migraine headache is a very strong throbbing pain on one side or both sides of your head. This type of headache can also cause other symptoms. It can last from 4 hours to 3 days. Talk with your doctor about what things may bring on (trigger) this condition. What are the causes? The exact cause of this condition is not known. This condition may be triggered or caused by:  Drinking alcohol.  Smoking.  Taking medicines, such as: ? Medicine used to treat chest pain (nitroglycerin). ? Birth control pills. ? Estrogen. ? Some blood pressure medicines.  Eating or drinking certain products.  Doing physical activity. Other things that may trigger a migraine headache include:  Having a menstrual period.  Pregnancy.  Hunger.  Stress.  Not getting enough sleep or getting too much sleep.  Weather changes.  Tiredness (fatigue). What increases the risk?  Being 78-18 years old.  Being female.  Having a family history of migraine headaches.  Being Caucasian.  Having depression or anxiety.  Being very overweight. What are the signs or symptoms?  A throbbing pain. This pain may: ? Happen in any area of the head, such as on one side or both sides. ? Make it hard to do daily activities. ? Get worse with physical activity. ? Get worse around bright lights or loud noises.  Other symptoms may include: ? Feeling sick to your stomach (nauseous). ? Vomiting. ? Dizziness. ? Being sensitive to bright lights, loud noises, or smells.  Before you get a migraine headache, you may get warning signs (an aura). An aura may include: ? Seeing flashing lights or having blind spots. ? Seeing bright spots, halos, or zigzag lines. ? Having tunnel vision or blurred  vision. ? Having numbness or a tingling feeling. ? Having trouble talking. ? Having weak muscles.  Some people have symptoms after a migraine headache (postdromal phase), such as: ? Tiredness. ? Trouble thinking (concentrating). How is this treated?  Taking medicines that: ? Relieve pain. ? Relieve the feeling of being sick to your stomach. ? Prevent migraine headaches.  Treatment may also include: ? Having acupuncture. ? Avoiding foods that bring on migraine headaches. ? Learning ways to control your body functions (biofeedback). ? Therapy to help you know and deal with negative thoughts (cognitive behavioral therapy). Follow these instructions at home: Medicines  Take over-the-counter and prescription medicines only as told by your doctor.  Ask your doctor if the medicine prescribed to you: ? Requires you to avoid driving or using heavy machinery. ? Can cause trouble pooping (constipation). You may need to take these steps to prevent or treat trouble pooping:  Drink enough fluid to keep your pee (urine) pale yellow.  Take over-the-counter or prescription medicines.  Eat foods that are high in fiber. These include beans, whole grains, and fresh fruits and vegetables.  Limit foods that are high in fat and sugar. These include fried or sweet foods. Lifestyle  Do not drink alcohol.  Do not use any products that contain nicotine or tobacco, such as cigarettes, e-cigarettes, and chewing tobacco. If you need help quitting, ask your doctor.  Get at least 8 hours of sleep every night.  Limit and deal with stress. General instructions      Keep a journal to  find out what may bring on your migraine headaches. For example, write down: ? What you eat and drink. ? How much sleep you get. ? Any change in what you eat or drink. ? Any change in your medicines.  If you have a migraine headache: ? Avoid things that make your symptoms worse, such as bright lights. ? It may  help to lie down in a dark, quiet room. ? Do not drive or use heavy machinery. ? Ask your doctor what activities are safe for you.  Keep all follow-up visits as told by your doctor. This is important. Contact a doctor if:  You get a migraine headache that is different or worse than others you have had.  You have more than 15 headache days in one month. Get help right away if:  Your migraine headache gets very bad.  Your migraine headache lasts longer than 72 hours.  You have a fever.  You have a stiff neck.  You have trouble seeing.  Your muscles feel weak or like you cannot control them.  You start to lose your balance a lot.  You start to have trouble walking.  You pass out (faint).  You have a seizure. Summary  A migraine headache is a very strong throbbing pain on one side or both sides of your head. These headaches can also cause other symptoms.  This condition may be treated with medicines and changes to your lifestyle.  Keep a journal to find out what may bring on your migraine headaches.  Contact a doctor if you get a migraine headache that is different or worse than others you have had.  Contact your doctor if you have more than 15 headache days in a month. This information is not intended to replace advice given to you by your health care provider. Make sure you discuss any questions you have with your health care provider. Document Revised: 03/26/2019 Document Reviewed: 01/14/2019 Elsevier Patient Education  [place].   Nephrotic Syndrome Nephrotic syndrome is a set of findings that show there is a problem with the kidneys. These findings include:  High levels of protein in the urine (proteinuria).  High blood pressure (hypertension).  Low levels of the protein albumin in the blood (hypoalbuminemia).  High levels of cholesterol (hyperlipidemia) and triglycerides (hypertriglyceridemia) in the blood.  Swelling (edema) of the face, abdomen,  arms, and legs. Nephrotic syndrome occurs when the kidneys' filters (glomeruli) are damaged. Glomeruli remove toxins and waste products from the bloodstream. As a result of damaged glomeruli, essential products such as proteins may also be removed from the bloodstream. Nephrotic syndrome results from the loss of proteins and other substances that the body needs. Nephrotic syndrome may increase your risk of further kidney damage and of health problems such as blood clots and infections. What are the causes? This condition may be caused by:  A kidney disease that damages the glomeruli, such as: ? Minimal change disease. ? Focal segmental glomerulosclerosis. ? Membranous nephropathy. ? Glomerulonephritis.  A condition or disease that affects other parts of the body (systemic), such as: ? Diabetes. ? Autoimmune diseases, such as lupus. ? Amyloidosis. ? Multiple myeloma. ? Some types of cancers. ? An infection, such as hepatitis C.  Certain medicines, such as: ? Nonsteroidal anti-inflammatory drugs (NSAIDs). ? Some anticancer drugs. In some cases, the cause may not be known. What are the signs or symptoms? Symptoms of this condition include:  Edema.  Foamy urine.  Unexplained weight gain.  Loss  of appetite. In some cases, there are no noticeable symptoms. How is this diagnosed? This condition is usually diagnosed with a dipstick urine test followed by a 24-hour urine test in which you collect all of the urine that you produce over a 24-hour period. If your test shows that you have this condition, more tests may be needed to find the cause. These may include blood, urine, imaging, or kidney biopsy tests. How is this treated? Treatment for this condition may include medicines to control symptoms or to prevent complications from occurring. These medicines may:  Decrease inflammation in the kidneys.  Lower blood pressure.  Lower cholesterol.  Reduce the blood's ability to  clot.  Help control edema. Further treatment will depend on the cause of the condition. Your health care provider will discuss treatment options with you. Follow these instructions at home:  Follow diet instructions from your health care provider. This may include changes to help manage edema or hypertension, such as limiting your intake of salt or fluids.  Take over-the-counter and prescription medicines only as told by your health care provider. ? Do not take any new over-the-counter medicines or nutritional supplements unless approved by your health care provider. Many medicines can make this condition worse. Doses may need to be adjusted.  Keep all follow-up visits as told by your health care provider. This is important. Contact a health care provider if:  Your symptoms do not go away as expected or you develop new symptoms.  You continue to gain weight.  You have increased swelling of the feet, ankles, or legs. Get help right away if:  You stop producing urine.  You have prolonged bleeding.  You have shortness of breath.  You have a severe headache.  You have severe weakness. This information is not intended to replace advice given to you by your health care provider. Make sure you discuss any questions you have with your health care provider. Document Revised: 11/14/2017 Document Reviewed: 11/25/2016 Elsevier Patient Education  2020 [employer].   Hypertension, Adult Hypertension is another name for high blood pressure. High blood pressure forces your heart to work harder to pump blood. This can cause problems over time. There are two numbers in a blood pressure reading. There is a top number (systolic) over a bottom number (diastolic). It is best to have a blood pressure that is below 120/80. Healthy choices can help lower your blood pressure, or you may need medicine to help lower it. What are the causes? The cause of this condition is not known. Some conditions may  be related to high blood pressure. What increases the risk?  Smoking.  Having type 2 diabetes mellitus, high cholesterol, or both.  Not getting enough exercise or physical activity.  Being overweight.  Having too much fat, sugar, calories, or salt (sodium) in your diet.  Drinking too much alcohol.  Having long-term (chronic) kidney disease.  Having a family history of high blood pressure.  Age. Risk increases with age.  Race. You may be at higher risk if you are African American.  Gender. Men are at higher risk than women before age 4. After age 54, women are at higher risk than men.  Having obstructive sleep apnea.  Stress. What are the signs or symptoms?  High blood pressure may not cause symptoms. Very high blood pressure (hypertensive crisis) may cause: ? Headache. ? Feelings of worry or nervousness (anxiety). ? Shortness of breath. ? Nosebleed. ? A feeling of being sick to your stomach (  nausea). ? Throwing up (vomiting). ? Changes in how you see. ? Very bad chest pain. ? Seizures. How is this treated?  This condition is treated by making healthy lifestyle changes, such as: ? Eating healthy foods. ? Exercising more. ? Drinking less alcohol.  Your health care provider may prescribe medicine if lifestyle changes are not enough to get your blood pressure under control, and if: ? Your top number is above 130. ? Your bottom number is above 80.  Your personal target blood pressure may vary. Follow these instructions at home: Eating and drinking   If told, follow the DASH eating plan. To follow this plan: ? Fill one half of your plate at each meal with fruits and vegetables. ? Fill one fourth of your plate at each meal with whole grains. Whole grains include whole-wheat pasta, brown rice, and whole-grain bread. ? Eat or drink low-fat dairy products, such as skim milk or low-fat yogurt. ? Fill one fourth of your plate at each meal with low-fat (lean) proteins.  Low-fat proteins include fish, chicken without skin, eggs, beans, and tofu. ? Avoid fatty meat, cured and processed meat, or chicken with skin. ? Avoid pre-made or processed food.  Eat less than 1,500 mg of salt each day.  Do not drink alcohol if: ? Your doctor tells you not to drink. ? You are pregnant, may be pregnant, or are planning to become pregnant.  If you drink alcohol: ? Limit how much you use to:  0-1 drink a day for women.  0-2 drinks a day for men. ? Be aware of how much alcohol is in your drink. In the [place] one drink equals one 12 oz bottle of beer (355 mL), one 5 oz glass of wine (148 mL), or one 1 oz glass of hard liquor (44 mL). Lifestyle   Work with your doctor to stay at a healthy weight or to lose weight. Ask your doctor what the best weight is for you.  Get at least 30 minutes of exercise most days of the week. This may include walking, swimming, or biking.  Get at least 30 minutes of exercise that strengthens your muscles (resistance exercise) at least 3 days a week. This may include lifting weights or doing Pilates.  Do not use any products that contain nicotine or tobacco, such as cigarettes, e-cigarettes, and chewing tobacco. If you need help quitting, ask your doctor.  Check your blood pressure at home as told by your doctor.  Keep all follow-up visits as told by your doctor. This is important. Medicines  Take over-the-counter and prescription medicines only as told by your doctor. Follow directions carefully.  Do not skip doses of blood pressure medicine. The medicine does not work as well if you skip doses. Skipping doses also puts you at risk for problems.  Ask your doctor about side effects or reactions to medicines that you should watch for. Contact a doctor if you:  Think you are having a reaction to the medicine you are taking.  Have headaches that keep coming back (recurring).  Feel dizzy.  Have swelling in your ankles.  Have trouble  with your vision. Get help right away if you:  Get a very bad headache.  Start to feel mixed up (confused).  Feel weak or numb.  Feel faint.  Have very bad pain in your: ? Chest. ? Belly (abdomen).  Throw up more than once.  Have trouble breathing. Summary  Hypertension is another name for high blood pressure.  High blood pressure forces your heart to work harder to pump blood.  For most people, a normal blood pressure is less than 120/80.  Making healthy choices can help lower blood pressure. If your blood pressure does not get lower with healthy choices, you may need to take medicine. This information is not intended to replace advice given to you by your health care provider. Make sure you discuss any questions you have with your health care provider. Document Revised: 08/12/2018 Document Reviewed: 08/12/2018 Elsevier Patient Education  2020 [employer].

## 2020-03-13 NOTE — Progress Notes (Addendum)
PATIENT: Susan Hopkins DOB: [birth year]  REASON FOR VISIT: follow up HISTORY FROM: patient  Chief Complaint  Patient presents with  . Follow-up    room 1, migraine, with husband     HISTORY OF PRESENT ILLNESS: Today 03/13/20 Susan Hopkins is a 49 y.o. female here today for follow up. She was admitted to hospital on 3/23 following a progressive headache in setting of HTN and nephrotic syndrome with witnessed seizure. MRI revealed concerns for PRES. She was started on levetiracetam for seizure prevention and treated for HTN. 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 Patient declined renal biopsy in hospital but plans to discuss with Dr Audie Clear. CT on 3/27 showed improving PRES.   She reports feeling better since discharge on 3/27. Headaches have improved. She continues propranolol ER 60mg  at bedtime. She uses tizanidine and tramadol as needed for abortive therapy. She has monitored BP at home and reports normal readings. She has limited sodium intake. She notes swelling in lower extremities has improved. No shob or chest pain. She is tolerating Xarelto well. No unusual bleeding.   HISTORY: (copied from my note on 02/08/2019)  Susan Hopkins is a 49 y.o. female here today for follow up of migraines. She continues propranolol 60mg  daily. She was previously taking Ajovy but was switched to [place]. She stopped Amovig several months ago as her copay card would no longer work and she could not afford medication. She reports that migraines are well managed. She may have 1-2 migraine days per month. She was taking diclofenac for abortive therapy but this was discontinued due to chronic kidney disease with proteinuria. She uses tramadol 50mg  as needed for abortive therapy with significant relief. She last refilled 30 tablets in 05/2019. She uses this medication sparingly.   She reports that over the past month she has had more headaches. She describes a tension style  headache behind her eyes, frontal and maxillary sinus region. Pain radiates to jaw intermittently. She does endorse intermittent congestion, ears feel full and pop, itchy, and watery eyes. She took Zyrtec 1-2 times but not consistently. She denies fever, cough, chills or body aches.    HISTORY: (copied from Dr Cathren Laine note on 07/28/2018)  Interval history 07/28/2018: She has not started the [place] yet, she has it at home. She can stop the Nortriptyline. She is having side effects to the Nortriptyline. Recommend decreasing Nortriptyline to 10mg  and then posisble stopping a month after Aimovig. She is not using the OTC medications. She is still on propranolol. Failed Topamax. She has 4 migraines a month, she takes diclofenac and zofran, can last days untreated. Doing well otherwise.  Interval history 01/26/2018: Migraines have worsened. Started Ajovy. Migraines are more frequent, she is using tylenol, ibuprofen, tramadol and diclofenac every day. She is using excedrin and multiple other medications. Counseled her on Medication Overuse. Had a long discussion, provided literature to read. Continue Ajovy.   Interval history 2018: She has migraines 4x a week. They can last 2 days. She stopped Nortriptyline and the headaches got worse. She has less than 10 headache free days, 10 migraine days a month. 4 days are severe migrainous and a few other days are migrainous but mild. Worse if she forgets the Nortriptyline. Propranolol also helps esp with palpitations, she feels worse if she forgets. She is feeling very depressed, she cries in the office today. She feels lonely, she doesn't have any family here and it is hard to raise children here.  She feels sad most days. She cries sometimes at home. No suicidal ideation. She has friends here, she has a close friend here. She has had these feelings for a long time. She has an appointment with her pcp next week.   Interval 05/20/2017:Patient returns today for  follow-up of migraines on nortriptyline and propranolol. Had side effects to Topamax. We were unable to increase her nortriptyline due to side effects. She takes Phenergan and diclofenac for acute management.In one month she has 2-3 migraines and if she takes the diclofenac and phenergan during the aura she does not get the migraines and she sleeps well and she is fine. She is doing well on the Nortriptyline and propranolol. She has tiredness to the nortriptyline, slowly titrate off. Every week go down a pill. Will stay on the propranolol. Will try [place] instead of diclofenac.  Interval history 12/19/2016:Patient is here for follow-up of chronic migraines. She is on nortriptyline and propranolol. Topamax was tried in the past the patient had side effects. Propranolol was increased at last appointment to 60 mg a day. We tried to increase the nortriptyline in the past but had joint pain. She had side effects to trip him. She takes Phenergan for headaches and diclofenac, I have discussed rebound headache and need to limit these. Relpax gave her chest pain and tightness. Amitriptyline made her too drowsy increased caffeine makes her headaches worse. Imitrex caused her neck and shoulder pain. Patient's headaches are described as throbbing, light sensitivity, sound sensitivity, neck and shoulder muscular pain, mostly on the right but can be on the left. She has been sleeping more and drinking caffeine which are triggers. She is fine today, no headache but has been suffering on and off for the last 3 weeks with migraines. They start after having coffee, would switch to decaf. And she has pain behind the eyes. She is having pain right now.   She was doing well for 4-5 months since last being seen. She started having a headache 3 weeks ago, she is having a lot of aura (does not always have an aura). 3 weeks ago had migraines all weekend. She has increased her Nortriptyline to 3 pills at night and propranolol to 20mg   tid. Headaches for the last 5 days as well.   Medications tried: Nortriptyline(on low dose could not increase doe),Amitriptyline(too sleepy),propranolol(on ER currently), Topamax, Relpax(chest pain and tightness), amitriptyline, Imitrex, Phenergananddiclofenacfor acute management.  Interval history 05/25/2016: She is on nortriptyline and propranolol and this has helped with the migraines. She is still having chronic migraines 4-5 in the last 2 weeks but she has been fasting and thinks dehydration is affecting. Before this the headaches were better she was having them 1-2x a month. For 3 months she had no headaches at all. Really just an increase in the last few weeks likely due to fasting. She takes cambia. She was sensitive to imitrex and relpax. With the relpax her blood pressure went up and she had jaw pain. She went to the emergency room, tongue swelling. She tried to increase the Nortriptyline and had joint pain. She is on 20mg  mortriptyline. She had side effects to trokendi. She takes phenergan for the headaches. She takes diclofenac 4-5x a week no more (can cause rebound headache)  Interval history 11/28/2015; She has worsening headaches. She is crying today, in the last 3 weeks over 8 migraines. Relpax gave her side effects, chest pain and tightness. She started Amitriptyline, she didn't have a headache for a few months. She  was having insomnia and amitrip helped a lot but made her too drowy. Sleep and increased caffeine makes her headaches worse. Yesterday she had a very bad migraine. She has had more than 8 migraines in the last 3 weeks.   HPI: Susan Hopkins is a 49 y.o. female here as a referral from Dr. Jimmey Ralph for Migraine  Migraines started 7 years ago. Used to have it 1-2x a year. Was worse with pregnancy. This last month she has pain on the right side of the head continuously. Throbbing. endorses Light sensitivity, sound sensitivity. Has wavy light aura. She has them 2x a month.  This month she has had it a at a low level all month, always there. She takes tylenol, ibuprofen daily. She has neck and shoulder muscular pain. Imitrex doesn't work, makes her shoulder sore, hands hurt. She has nausea and vomiting with the headaches. She went back to school this month, that may be exacerbating the headaches. She has a new glasses prescription. She has 4 kids. No more kids, has an IUD. She gets to sleep but everything wakes her up. She can't nap during the day. No snoring. Migraine can be severe. Can be 10/10. 8-10 hours.    REVIEW OF SYSTEMS: Out of a complete 14 system review of symptoms, the patient complains only of the following symptoms, headaches, seizure, lower extremity edema and all other reviewed systems are negative.  ALLERGIES: Allergies  Allergen Reactions  . Relpax [Eletriptan] Shortness Of Breath    Throat swelling  . Oxycodone Itching    All over the body  . Phenergan [Promethazine Hcl]     Restless, anxious    HOME MEDICATIONS: Outpatient Medications Prior to Visit  Medication Sig Dispense Refill  . albuterol (VENTOLIN HFA) 108 (90 Base) MCG/ACT inhaler Inhale 2 puffs into the lungs every 6 (six) hours as needed for wheezing or shortness of breath.    Marland Kitchen amLODipine (NORVASC) 5 MG tablet Take 1 tablet (5 mg total) by mouth daily. 30 tablet 0  . cetirizine (ZYRTEC) 10 MG tablet Take 10 mg by mouth as needed for allergies.    . furosemide (LASIX) 20 MG tablet Take 40 mg by mouth daily.     . hydrochlorothiazide (HYDRODIURIL) 25 MG tablet Take 1 tablet (25 mg total) by mouth daily. 30 tablet 0  . levothyroxine (SYNTHROID) 50 MCG tablet Take 50 mcg by mouth daily.    Marland Kitchen losartan (COZAAR) 100 MG tablet Take 100 mg by mouth daily.    . metFORMIN (GLUCOPHAGE) 1000 MG tablet Take 1,000 mg by mouth daily.     . propranolol ER (INDERAL LA) 60 MG 24 hr capsule Take 1 capsule (60 mg total) by mouth at bedtime. 90 capsule 3  . Rivaroxaban 15 & 20 MG TBPK Follow package  directions: Take one 15mg  tablet by mouth twice a day. On day 22, switch to one 20mg  tablet once a day. Take with food. 51 each 0  . spironolactone (ALDACTONE) 25 MG tablet Take 1 tablet (25 mg total) by mouth daily. 30 tablet 0  . tiZANidine (ZANAFLEX) 4 MG tablet TAKE 1 TABLET (4 MG TOTAL) BY MOUTH EVERY 6 (SIX) HOURS AS NEEDED FOR MUSCLE SPASMS. 30 tablet 11  . traMADol (ULTRAM) 50 MG tablet TAKE 1 TO 2 TABLETS EVERY 6 HOURS FOR PAIN (Patient taking differently: Take 50-100 mg by mouth every 6 (six) hours. ) 30 tablet 0  . Vitamin D, Ergocalciferol, (DRISDOL) 50000 units CAPS capsule TAKE 1 CAPSULE BY MOUTH  ONCE A WEEK. FOR 8 WEEKS (Patient taking differently: Take 50,000 Units by mouth every 7 (seven) days. Sundays) 8 capsule 0  . zolpidem (AMBIEN) 5 MG tablet Take 1 tablet (5 mg total) by mouth at bedtime as needed for sleep. 10 tablet 0  . levETIRAcetam (KEPPRA) 500 MG tablet Take 1 tablet (500 mg total) by mouth 2 (two) times daily. 60 tablet 0   No facility-administered medications prior to visit.    PAST MEDICAL HISTORY: Past Medical History:  Diagnosis Date  . ADVANCED MATERNAL AGE 43/02/2008  . ANEMIA, IRON DEFICIENCY, UNSPEC. 02/12/2007   after IUD  . Constipation   . Contact dermatitis 08/27/2011  . Diabetes mellitus without complication (HCC)    gestational diab. currently & with infant #2  . DIABETES MELLITUS, GESTATIONAL, HX OF 05/18/2008  . Diabetes mellitus, type II (HCC)    Hbg A1C 6.9 after pregancy, now on Metformin  . Diarrhea   . 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 Gastric ulcer    history of  . GERD (gastroesophageal reflux disease)    doesn't take anything for acid reflux  . Gestational diabetes A2/B 07/20/2012   Fetal echo-nml-under media tab, needs baseline labs-nml with nml TSH and 24 hour urine-68mg . To see optho in Nov 2013-Per pt. Ok, serial u/s for growth, 2x/wk testing at 32 wks.   . Hemorrhoids   .  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 IUD complication (HCC) 07/28/2015  . Migraine headache    last migraine in Sept 2012    PAST SURGICAL HISTORY: Past Surgical History:  Procedure Laterality Date  . CESAREAN SECTION N/A 01/22/2013   Procedure: CESAREAN SECTION;  Surgeon: Reva Bores, MD;  Location: [facility];  Service: Obstetrics;  Laterality: N/A;  Repeat  . CESAREAN SECTION  2005  . CESAREAN SECTION  2010  . CHOLECYSTECTOMY  11/28/2011   Procedure: LAPAROSCOPIC CHOLECYSTECTOMY WITH INTRAOPERATIVE CHOLANGIOGRAM;  Surgeon: Wilmon Arms. Corliss Skains, MD;  Location: [facility];  Service: General;  Laterality: N/A;  . DILATION AND CURETTAGE OF UTERUS  2009  . female circumcision  89  . TONSILLECTOMY  1989    FAMILY HISTORY: Family History  Problem Relation Age of Onset  . Diabetes Mother   . Hypertension Mother   . Hyperlipidemia Mother   . Hypertension Father   . Hyperlipidemia Father   . Prostate cancer Father   . Diabetes Father   . Anesthesia problems Neg Hx   . Hypotension Neg Hx   . Malignant hyperthermia Neg Hx   . Pseudochol deficiency Neg Hx     SOCIAL HISTORY: Social History   Socioeconomic History  . Marital status: Married    Spouse name: [name]  . Number of children: 4  . Years of education: Ba  . Highest education level: Not on file  Occupational History  . Occupation: [employer]  Tobacco Use  . Smoking status: Never Smoker  . Smokeless tobacco: Never Used  Substance and Sexual Activity  . Alcohol use: No  . Drug use: No  . Sexual activity: Yes    Birth control/protection: I.U.D.  Other Topics Concern  . Not on file  Social History Narrative   Lives at home with husband and 4 kids.   Right handed.    Caffeine use: Coffee (1-2 cups/day)   Social Determinants of Health   Financial Resource Strain:   . Difficulty of Paying Living Expenses:   Food Insecurity:   . Worried About Radiation protection practitioner  of Food in the Last Year:   . Ran Out of Food in the Last Year:     Transportation Needs:   . Lack of Transportation (Medical):   Marland Kitchen. Lack of Transportation (Non-Medical):   Physical Activity:   . Days of Exercise per Week:   . Minutes of Exercise per Session:   Stress:   . Feeling of Stress :   Social Connections:   . Frequency of Communication with Friends and Family:   . Frequency of Social Gatherings with Friends and Family:   . Attends Religious Services:   . Active Member of Clubs or Organizations:   . Attends BankerClub or Organization Meetings:   Marland Kitchen. Marital Status:   Intimate Partner Violence:   . Fear of Current or Ex-Partner:   . Emotionally Abused:   Marland Kitchen. Physically Abused:   . Sexually Abused:       PHYSICAL EXAM  Vitals:   03/13/20 1136  BP: 122/84  Pulse: 74  Temp: (!) 97.4 F (36.3 C)  Weight: 181 lb (82.1 kg)  Height: 5\' 6"  (1.676 m)   Body mass index is 29.21 kg/m.  Generalized: Well developed, in no acute distress  Cardiology: normal rate and rhythm, no murmur noted Respiratory: clear to auscultation bilaterally.  Neurological examination  Mentation: Alert oriented to time, place, history taking. Follows all commands speech and language fluent Cranial nerve II-XII: Pupils were equal round reactive to light. Extraocular movements were full, visual field were full on confrontational test. Facial sensation and strength were normal. Uvula tongue midline. Head turning and shoulder shrug  were normal and symmetric. Motor: The motor testing reveals 5 over 5 strength of all 4 extremities. Good symmetric motor tone is noted throughout.  Sensory: Sensory testing is intact to soft touch on all 4 extremities. No evidence of extinction is noted.  Coordination: Cerebellar testing reveals good finger-nose-finger and heel-to-shin bilaterally.  Gait and station: Gait is normal.  Skin: mild ankle swelling bilaterally  DIAGNOSTIC DATA (LABS, IMAGING, TESTING) - I reviewed patient records, labs, notes, testing and imaging myself where  available.  No flowsheet data found.   Lab Results  Component Value Date   WBC 5.8 03/11/2020   HGB 12.1 03/11/2020   HCT 38.8 03/11/2020   MCV 76.8 (L) 03/11/2020   PLT 454 (H) 03/11/2020      Component Value Date/Time   NA 135 03/11/2020 0248   NA 140 03/09/2015 1356   K 3.9 03/11/2020 0248   CL 104 03/11/2020 0248   CO2 19 (L) 03/11/2020 0248   GLUCOSE 237 (H) 03/11/2020 0248   BUN 16 03/11/2020 0248   BUN 8 03/09/2015 1356   CREATININE 0.59 03/11/2020 0248   CREATININE 0.45 (L) 10/29/2016 1542   CALCIUM 8.7 (L) 03/11/2020 0248   PROT 5.4 (L) 03/07/2020 2057   PROT 7.0 03/09/2015 1356   ALBUMIN 2.0 (L) 03/07/2020 2057   ALBUMIN 4.2 03/09/2015 1356   AST 17 03/07/2020 2057   ALT 19 03/07/2020 2057   ALKPHOS 51 03/07/2020 2057   BILITOT 0.4 03/07/2020 2057   BILITOT 0.2 03/09/2015 1356   GFRNONAA >60 03/11/2020 0248   GFRAA >60 03/11/2020 0248   No results found for: CHOL, HDL, LDLCALC, LDLDIRECT, TRIG, CHOLHDL Lab Results  Component Value Date   HGBA1C 6.8 (H) 03/08/2020   Lab Results  Component Value Date   VITAMINB12 336 05/07/2016   Lab Results  Component Value Date   TSH 2.040 05/04/2018  ASSESSMENT AND PLAN 48 y.o. year old female  has a past medical history of ADVANCED MATERNAL AGE (05/18/2008), ANEMIA, IRON DEFICIENCY, UNSPEC. 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 here with     ICD-10-CM   1. PRES (posterior reversible encephalopathy syndrome)  I67.83   2. Migraine without aura and without status  migrainosus, not intractable  G43.009   3. Nephrotic syndrome  N04.9     Emiyah is doing much better since discharge from the hospital on 03/11/2020.  Headaches have improved.  She has been monitoring blood pressures at home and reports these have been fairly stable.  She was advised to continue propranolol 60 mg at bedtime.  We will also continue levetiracetam for both seizure prevention and migraine prevention.  She may continue use of tizanidine and tramadol sparingly for migraine abortion.  I have advised that she follow-up closely with primary care and nephrology.  She will continue Xarelto as prescribed.  She will seek emergency medical attention immediately for any worsening headaches, shortness of breath, chest pain or vision changes.  She will follow-up with me in 3 months.  We will anticipate repeating MRI at that time for eval.  She and her husband verbalized understanding and agreement with this plan.   No orders of the defined types were placed in this encounter.    Meds ordered this encounter  Medications  . levETIRAcetam (KEPPRA) 500 MG tablet    Sig: Take 1 tablet (500 mg total) by mouth 2 (two) times daily.    Dispense:  60 tablet    Refill:  2    Order Specific Question:   Supervising Provider    Answer:   Anson Fret [id number]      I spent 30 minutes with the patient. 50% of this time was spent counseling and educating patient on plan of care and medications.     Shawnie Dapper, FNP-C 03/13/2020, 4:28 PM [facility] [address], [address] [place], [place] [address] [phone]  Made any corrections needed, and agree with history, physical, neuro exam,assessment and plan as stated.     Naomie Dean, MD [facility]

## 2020-04-04 ENCOUNTER — Other Ambulatory Visit: Payer: Self-pay | Admitting: Internal Medicine

## 2020-04-15 ENCOUNTER — Encounter (HOSPITAL_BASED_OUTPATIENT_CLINIC_OR_DEPARTMENT_OTHER): Payer: Self-pay

## 2020-04-15 ENCOUNTER — Emergency Department (HOSPITAL_BASED_OUTPATIENT_CLINIC_OR_DEPARTMENT_OTHER): Payer: BLUE CROSS/BLUE SHIELD

## 2020-04-15 ENCOUNTER — Emergency Department (HOSPITAL_BASED_OUTPATIENT_CLINIC_OR_DEPARTMENT_OTHER)
Admission: EM | Admit: 2020-04-15 | Discharge: 2020-04-15 | Disposition: A | Discharge status: Home / Self Care | Payer: BLUE CROSS/BLUE SHIELD | Attending: Emergency Medicine | Admitting: Emergency Medicine

## 2020-04-15 ENCOUNTER — Other Ambulatory Visit: Payer: Self-pay

## 2020-04-15 DIAGNOSIS — Z7984 Long term (current) use of oral hypoglycemic drugs: Secondary | ICD-10-CM | POA: Insufficient documentation

## 2020-04-15 DIAGNOSIS — Z9049 Acquired absence of other specified parts of digestive tract: Secondary | ICD-10-CM | POA: Insufficient documentation

## 2020-04-15 DIAGNOSIS — I10 Essential (primary) hypertension: Secondary | ICD-10-CM | POA: Diagnosis not present

## 2020-04-15 DIAGNOSIS — E119 Type 2 diabetes mellitus without complications: Secondary | ICD-10-CM | POA: Diagnosis not present

## 2020-04-15 DIAGNOSIS — Z7901 Long term (current) use of anticoagulants: Secondary | ICD-10-CM | POA: Insufficient documentation

## 2020-04-15 DIAGNOSIS — M79662 Pain in left lower leg: Secondary | ICD-10-CM | POA: Diagnosis not present

## 2020-04-15 DIAGNOSIS — Z79899 Other long term (current) drug therapy: Secondary | ICD-10-CM | POA: Insufficient documentation

## 2020-04-15 DIAGNOSIS — M79605 Pain in left leg: Secondary | ICD-10-CM | POA: Diagnosis present

## 2020-04-15 NOTE — ED Triage Notes (Signed)
Pt. arrives with reports of calf pain to left calf X2 days states that she called her PCP and they advised she come to ED to have [place].

## 2020-04-15 NOTE — Discharge Instructions (Addendum)
Continue your medications as previously prescribed.  Take Tylenol 1000 mg every 8 hours as needed for pain.  Follow-up with your primary doctor if symptoms or not improving in the next 3 to 4 days.

## 2020-04-15 NOTE — ED Provider Notes (Signed)
[facility] EMERGENCY DEPARTMENT Provider Note   CSN: [id number] Arrival date & time: 04/15/20  1537     History Chief Complaint  Patient presents with  . Leg Pain    Susan Hopkins is a 49 y.o. female.  Patient is a 49 year old female with history of diabetes, anemia, and recently diagnosed pulmonary embolism.  She was started on Xarelto 1 month ago at the time of diagnosis.  She presents today for evaluation of left leg pain.  She describes pain in her calf for the past 2 days that began in the absence of any specific injury or trauma.  She denies any swelling or redness of the leg.  She denies any fevers or chills.  She denies chest pain or shortness of breath.  The history is provided by the patient.  Leg Pain Location:  Leg Leg location:  L lower leg Pain details:    Quality:  Aching   Radiates to:  Does not radiate   Severity:  Moderate   Onset quality:  Sudden   Duration:  2 days   Timing:  Constant Relieved by: Rest. Exacerbated by: Ambulation.      Past Medical History:  Diagnosis Date  . ADVANCED MATERNAL AGE 59/02/2008  . ANEMIA, IRON DEFICIENCY, UNSPEC. 02/12/2007   after IUD  . Constipation   . Contact dermatitis 08/27/2011  . Diabetes mellitus without complication (HCC)    gestational diab. currently & with infant #2  . DIABETES MELLITUS, GESTATIONAL, HX OF 05/18/2008  . Diabetes mellitus, type II (HCC)    Hbg A1C 6.9 after pregancy, now on Metformin  . Diarrhea   . 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 Gastric ulcer    history of  . GERD (gastroesophageal reflux disease)    doesn't take anything for acid reflux  . Gestational diabetes A2/B 07/20/2012   Fetal echo-nml-under media tab, needs baseline labs-nml with nml TSH and 24 hour urine-68mg . To see optho in Nov 2013-Per pt. Ok, serial u/s for growth, 2x/wk testing at 32 wks.   . Hemorrhoids   . 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 IUD  complication (HCC) 07/28/2015  . Migraine headache    last migraine in Sept 2012    Patient Active Problem List   Diagnosis Date Noted  . PRES (posterior reversible encephalopathy syndrome) 03/07/2020  . Status epilepticus (HCC)   . Pulmonary embolism (HCC)   . Acute pain of both shoulders 07/03/2018  . Tension headache 07/03/2018  . Flea bite of lower leg 07/03/2018  . Rash and nonspecific skin eruption 06/24/2018  . Bite, insect 06/15/2018  . Dry skin 01/14/2018  . Stress incontinence 09/03/2017  . Type 2 diabetes mellitus without complication, without long-term current use of insulin (HCC) 09/03/2017  . Seasonal allergic rhinitis 09/03/2017  . Perimenopausal 09/03/2017  . Vaginal discharge 08/10/2016  . Nasal pain 05/08/2016  . Fatigue 05/07/2016  . Vitamin D deficiency 01/05/2016  . Essential hypertension, benign 12/22/2015  . Venous (peripheral) insufficiency 11/05/2015  . Daily headache 03/09/2015  . Elevated liver enzymes 08/26/2012  . Mammographic breast lesion 07/30/2012  . Overweight (BMI 25.0-29.9) 10/15/2011  . Migraine 01/09/2011  . ANEMIA, IRON DEFICIENCY, UNSPEC. 02/12/2007  . GASTROESOPHAGEAL REFLUX, NO ESOPHAGITIS 02/12/2007    Past Surgical History:  Procedure Laterality Date  . CESAREAN SECTION N/A 01/22/2013   Procedure: CESAREAN SECTION;  Surgeon: Reva Bores, MD;  Location: [facility];  Service: Obstetrics;  Laterality: N/A;  Repeat  .  CESAREAN SECTION  2005  . CESAREAN SECTION  2010  . CHOLECYSTECTOMY  11/28/2011   Procedure: LAPAROSCOPIC CHOLECYSTECTOMY WITH INTRAOPERATIVE CHOLANGIOGRAM;  Surgeon: Wilmon Arms. Corliss Skains, MD;  Location: [facility];  Service: General;  Laterality: N/A;  . DILATION AND CURETTAGE OF UTERUS  2009  . female circumcision  36  . TONSILLECTOMY  1989     OB History    Gravida  5   Para  4   Term  4   Preterm      AB  1   Living  3     SAB  1   TAB      Ectopic      Multiple      Live Births  3            Family History  Problem Relation Age of Onset  . Diabetes Mother   . Hypertension Mother   . Hyperlipidemia Mother   . Hypertension Father   . Hyperlipidemia Father   . Prostate cancer Father   . Diabetes Father   . Anesthesia problems Neg Hx   . Hypotension Neg Hx   . Malignant hyperthermia Neg Hx   . Pseudochol deficiency Neg Hx     Social History   Tobacco Use  . Smoking status: Never Smoker  . Smokeless tobacco: Never Used  Substance Use Topics  . Alcohol use: No  . Drug use: No    Home Medications Prior to Admission medications   Medication Sig Start Date End Date Taking? Authorizing Provider  albuterol (VENTOLIN HFA) 108 (90 Base) MCG/ACT inhaler Inhale 2 puffs into the lungs every 6 (six) hours as needed for wheezing or shortness of breath.    [provider]  amLODipine (NORVASC) 5 MG tablet Take 1 tablet (5 mg total) by mouth daily. 03/11/20   Elige Radon, MD  cetirizine (ZYRTEC) 10 MG tablet Take 10 mg by mouth as needed for allergies.    [provider]  furosemide (LASIX) 20 MG tablet Take 40 mg by mouth daily.  10/27/19   [provider]  hydrochlorothiazide (HYDRODIURIL) 25 MG tablet Take 1 tablet (25 mg total) by mouth daily. 03/11/20   Elige Radon, MD  levETIRAcetam (KEPPRA) 500 MG tablet Take 1 tablet (500 mg total) by mouth 2 (two) times daily. 03/13/20   Lomax, Amy, NP  levothyroxine (SYNTHROID) 50 MCG tablet Take 50 mcg by mouth daily. 12/30/19   [provider]  losartan (COZAAR) 100 MG tablet Take 100 mg by mouth daily. 01/26/20   [provider]  metFORMIN (GLUCOPHAGE) 1000 MG tablet Take 1,000 mg by mouth daily.  12/30/19   [provider]  propranolol ER (INDERAL LA) 60 MG 24 hr capsule Take 1 capsule (60 mg total) by mouth at bedtime. 02/09/20   Lomax, Amy, NP  Rivaroxaban 15 & 20 MG TBPK Follow package directions: Take one 15mg  tablet by mouth twice a day. On day 22, switch to one 20mg   tablet once a day. Take with food. 03/10/20   , MD  spironolactone (ALDACTONE) 25 MG tablet Take 1 tablet (25 mg total) by mouth daily. 03/11/20   Christian, Rylee, MD  tiZANidine (ZANAFLEX) 4 MG tablet TAKE 1 TABLET (4 MG TOTAL) BY MOUTH EVERY 6 (SIX) HOURS AS NEEDED FOR MUSCLE SPASMS. 08/06/19   03/13/20, MD  traMADol (ULTRAM) 50 MG tablet TAKE 1 TO 2 TABLETS EVERY 6 HOURS FOR PAIN Patient taking differently: Take 50-100 mg by mouth  every 6 (six) hours.  02/09/20   Lomax, Amy, NP  Vitamin D, Ergocalciferol, (DRISDOL) 50000 units CAPS capsule TAKE 1 CAPSULE BY MOUTH ONCE A WEEK. FOR 8 WEEKS Patient taking differently: Take 50,000 Units by mouth every 7 (seven) days. Sundays 07/17/18   Diallo, Earna Coder, MD  zolpidem (AMBIEN) 5 MG tablet Take 1 tablet (5 mg total) by mouth at bedtime as needed for sleep. 03/04/20   Isla Pence, MD    Allergies    Relpax [eletriptan], Oxycodone, and Phenergan [promethazine hcl]  Review of Systems   Review of Systems  All other systems reviewed and are negative.   Physical Exam Updated Vital Signs BP 97/65 (BP Location: Left Arm)   Pulse 74   Temp 98.3 F (36.8 C) (Oral)   Resp 18   Ht 5\' 6"  (1.676 m)   Wt 74.4 kg   SpO2 98%   BMI 26.47 kg/m   Physical Exam Vitals and nursing note reviewed.  Constitutional:      General: She is not in acute distress.    Appearance: Normal appearance. She is not ill-appearing, toxic-appearing or diaphoretic.  HENT:     Head: Normocephalic and atraumatic.  Pulmonary:     Effort: Pulmonary effort is normal.  Musculoskeletal:     Comments: The left lower extremity appears grossly normal and symmetrical with the right.  There is no edema or swelling.  There is minimal calf tenderness and Homans' sign is absent.  DP pulses and PT pulses are easily palpable and motor and sensation is intact to the entire foot.  Skin:    General: Skin is warm and dry.  Neurological:     Mental Status: She is  alert.     ED Results / Procedures / Treatments   Labs (all labs ordered are listed, but only abnormal results are displayed) Labs Reviewed - No data to display  EKG None  Radiology No results found.  Procedures Procedures (including critical care time)  Medications Ordered in ED Medications - No data to display  ED Course  I have reviewed the triage vital signs and the nursing notes.  Pertinent labs & imaging results that were available during my care of the patient were reviewed by me and considered in my medical decision making (see chart for details).    MDM Rules/Calculators/A&P  Patient presents with left calf pain.  She has a history of PE and is currently taking Xarelto.  Her physical examination is essentially unremarkable and Bevelyn Buckles' sign is absent.  Her ultrasound is negative for DVT.  At this point, I feel as though discharge is appropriate.  Patient to take Tylenol, rest, and follow-up as needed.  Final Clinical Impression(s) / ED Diagnoses Final diagnoses:  None    Rx / DC Orders ED Discharge Orders    None       Veryl Speak, MD 04/15/20 1723

## 2020-04-23 ENCOUNTER — Emergency Department (HOSPITAL_BASED_OUTPATIENT_CLINIC_OR_DEPARTMENT_OTHER)
Admission: EM | Admit: 2020-04-23 | Discharge: 2020-04-23 | Disposition: A | Discharge status: Home / Self Care | Payer: BLUE CROSS/BLUE SHIELD | Attending: Emergency Medicine | Admitting: Emergency Medicine

## 2020-04-23 ENCOUNTER — Encounter (HOSPITAL_BASED_OUTPATIENT_CLINIC_OR_DEPARTMENT_OTHER): Payer: Self-pay

## 2020-04-23 ENCOUNTER — Other Ambulatory Visit: Payer: Self-pay

## 2020-04-23 DIAGNOSIS — Z79899 Other long term (current) drug therapy: Secondary | ICD-10-CM | POA: Diagnosis not present

## 2020-04-23 DIAGNOSIS — R110 Nausea: Secondary | ICD-10-CM

## 2020-04-23 DIAGNOSIS — R112 Nausea with vomiting, unspecified: Secondary | ICD-10-CM | POA: Diagnosis not present

## 2020-04-23 DIAGNOSIS — Z885 Allergy status to narcotic agent status: Secondary | ICD-10-CM | POA: Insufficient documentation

## 2020-04-23 DIAGNOSIS — R519 Headache, unspecified: Secondary | ICD-10-CM

## 2020-04-23 DIAGNOSIS — Z86711 Personal history of pulmonary embolism: Secondary | ICD-10-CM | POA: Diagnosis not present

## 2020-04-23 DIAGNOSIS — E119 Type 2 diabetes mellitus without complications: Secondary | ICD-10-CM | POA: Diagnosis not present

## 2020-04-23 DIAGNOSIS — Z7984 Long term (current) use of oral hypoglycemic drugs: Secondary | ICD-10-CM | POA: Diagnosis not present

## 2020-04-23 DIAGNOSIS — Z888 Allergy status to other drugs, medicaments and biological substances status: Secondary | ICD-10-CM | POA: Diagnosis not present

## 2020-04-23 LAB — COMPREHENSIVE METABOLIC PANEL
ALT: 15 U/L (ref 0–44)
AST: 15 U/L (ref 15–41)
Albumin: 3.2 g/dL — ABNORMAL LOW (ref 3.5–5.0)
Alkaline Phosphatase: 48 U/L (ref 38–126)
Anion gap: 11 (ref 5–15)
BUN: 12 mg/dL (ref 6–20)
CO2: 23 mmol/L (ref 22–32)
Calcium: 9.3 mg/dL (ref 8.9–10.3)
Chloride: 103 mmol/L (ref 98–111)
Creatinine, Ser: 0.64 mg/dL (ref 0.44–1.00)
GFR calc Af Amer: >60 mL/min (ref >60)
GFR calc non Af Amer: >60 mL/min (ref >60)
Glucose, Bld: 147 mg/dL — ABNORMAL HIGH (ref 70–99)
Potassium: 3.5 mmol/L (ref 3.5–5.1)
Sodium: 137 mmol/L (ref 135–145)
Total Bilirubin: 0.6 mg/dL (ref 0.3–1.2)
Total Protein: 6.9 g/dL (ref 6.5–8.1)

## 2020-04-23 LAB — CBC WITH DIFFERENTIAL/PLATELET
Abs Immature Granulocytes: 0.02 10*3/uL (ref 0.00–0.07)
Basophils Absolute: 0 10*3/uL (ref 0.0–0.1)
Basophils Relative: 1 %
Eosinophils Absolute: 0.1 10*3/uL (ref 0.0–0.5)
Eosinophils Relative: 2 %
HCT: 33.7 % — ABNORMAL LOW (ref 36.0–46.0)
Hemoglobin: 10.9 g/dL — ABNORMAL LOW (ref 12.0–15.0)
Immature Granulocytes: 1 %
Lymphocytes Relative: 45 %
Lymphs Abs: 1.9 10*3/uL (ref 0.7–4.0)
MCH: 25.1 pg — ABNORMAL LOW (ref 26.0–34.0)
MCHC: 32.3 g/dL (ref 30.0–36.0)
MCV: 77.5 fL — ABNORMAL LOW (ref 80.0–100.0)
Monocytes Absolute: 0.3 10*3/uL (ref 0.1–1.0)
Monocytes Relative: 7 %
Neutro Abs: 1.8 10*3/uL (ref 1.7–7.7)
Neutrophils Relative %: 44 %
Platelets: 347 10*3/uL (ref 150–400)
RBC: 4.35 MIL/uL (ref 3.87–5.11)
RDW: 16.4 % — ABNORMAL HIGH (ref 11.5–15.5)
WBC: 4.2 10*3/uL (ref 4.0–10.5)
nRBC: 0 % (ref 0.0–0.2)

## 2020-04-23 LAB — CBG MONITORING, ED: Glucose-Capillary: 155 mg/dL — ABNORMAL HIGH (ref 70–99)

## 2020-04-23 MED ORDER — ONDANSETRON HCL 4 MG/2ML IJ SOLN
4.0000 mg | Freq: Once | INTRAMUSCULAR | Status: AC
  Administered 2020-04-23: 4 mg via INTRAVENOUS
  Filled 2020-04-23: qty 2

## 2020-04-23 MED ORDER — DEXAMETHASONE SODIUM PHOSPHATE 10 MG/ML IJ SOLN
8.0000 mg | Freq: Once | INTRAMUSCULAR | Status: AC
  Administered 2020-04-23: 8 mg via INTRAVENOUS
  Filled 2020-04-23: qty 1

## 2020-04-23 MED ORDER — METOCLOPRAMIDE HCL 5 MG/ML IJ SOLN
10.0000 mg | Freq: Once | INTRAMUSCULAR | Status: AC
  Administered 2020-04-23: 10 mg via INTRAVENOUS
  Filled 2020-04-23: qty 2

## 2020-04-23 MED ORDER — SODIUM CHLORIDE 0.9 % IV SOLN: Freq: Once | INTRAVENOUS | Status: AC

## 2020-04-23 MED ORDER — DIPHENHYDRAMINE HCL 25 MG PO CAPS
25.0000 mg | ORAL_CAPSULE | Freq: Once | ORAL | Status: DC
  Filled 2020-04-23: qty 1

## 2020-04-23 MED ORDER — DIPHENHYDRAMINE HCL 50 MG/ML IJ SOLN
25.0000 mg | Freq: Once | INTRAMUSCULAR | Status: AC
  Administered 2020-04-23: 25 mg via INTRAVENOUS

## 2020-04-23 MED ORDER — DIPHENHYDRAMINE HCL 50 MG/ML IJ SOLN
INTRAMUSCULAR | Status: AC
  Filled 2020-04-23: qty 1

## 2020-04-23 NOTE — ED Triage Notes (Addendum)
Pt c/o migraine HA with photophobia, N/V that started on Friday. Pt tried home PO medications without relief. Pt vomited x1 in the lobby. Pt states when she vomited she strained her neck and now has neck pain and numbness in both arms. [facility] neuro exam is negative.

## 2020-04-23 NOTE — ED Provider Notes (Signed)
[facility] EMERGENCY DEPARTMENT Provider Note   CSN: [id number] Arrival date & time: 04/23/20  1257     History Chief Complaint  Patient presents with  . Migraine    Susan Hopkins is a 49 y.o. female.  HPI Susan Ceciluha Jowett is a 49 y.o. female with chronic headaches who was seen in the ED for severe headache on 03/03/2020 and again on 03/04/2020 and 03/11/20.   3/19 visit she was thought to have a migraine and responded to conventional migraine cocktails.  A CT of the head on 03/03/2020 was unremarkable.  She was seen again on 03/07/2020 for headache, vision loss and altered level of consciousness.  She also had some suspected seizure activity.  A code stroke was called.  She was ultimately diagnosed with PRES despite only mildly elevated blood pressures.  She was also started on Xarelto for an incidental pulmonary embolism noted on cervical CT angiogram.  This was thought possibly due to nephrotic syndrome (proteinuria with normal BUN and creatinine).  She states she is followed by [facility] for several years but has [facility].  Patient presents today with severe 10/10 headaches that began on Friday and has been intermittent since that time.  She states generally last several hours and states that she does not know what her triggers are.  She states it is generalized and appears to be somewhat in the posterior part of her head where it usually is.  She endorses some left-sided neck pain which she states is normal for her migraines.  Complains of general weakness.  On my review of EMR versus identical to how she is presented in the past with her migraines.  Patient has a history of PR ES  however she states that she has been taking her blood pressure medications as prescribed recently.  She states she takes no medications for headaches.  She states that her headache is similar to prior migraine headaches.  She states that she has no  focal weakness or sensation loss anywhere.  This is specifically different from what was mentioned in the triage note.  She does endorse some neck pain which she states is similar to her migraine presentations in the past.  She states that his left side and achy in quality.  She states that she has vomited several times which is been nonbloody nonbilious.  She states this is also similar to prior migraines along with photophobia which she states she has a mild form of today.    Past Medical History:  Diagnosis Date  . ADVANCED MATERNAL AGE 32/02/2008  . ANEMIA, IRON DEFICIENCY, UNSPEC. 02/12/2007   after IUD  . Constipation   . Contact dermatitis 08/27/2011  . Diabetes mellitus without complication (HCC)    gestational diab. currently & with infant #2  . DIABETES MELLITUS, GESTATIONAL, HX OF 05/18/2008  . Diabetes mellitus, type II (HCC)    Hbg A1C 6.9 after pregancy, now on Metformin  . Diarrhea   . 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 Gastric ulcer    history of  . GERD (gastroesophageal reflux disease)    doesn't take anything for acid reflux  . Gestational diabetes A2/B 07/20/2012   Fetal echo-nml-under media tab, needs baseline labs-nml with nml TSH and 24 hour urine-68mg . To see optho in Nov 2013-Per pt. Ok, serial u/s for growth, 2x/wk testing at 32 wks.   . Hemorrhoids   . HYPEREMESIS GRAVIDARUM  04/29/2008  . IUD complication ([place]) 04/11/622  . Migraine headache    last migraine in Sept 2012    Patient Active Problem List   Diagnosis Date Noted  . PRES (posterior reversible encephalopathy syndrome) 03/07/2020  . Status epilepticus ([place])   . Pulmonary embolism ([place])   . Acute pain of both shoulders 07/03/2018  . Tension headache 07/03/2018  . Flea bite of lower leg 07/03/2018  . Rash and nonspecific skin eruption 06/24/2018  . Bite, insect 06/15/2018  . Dry skin 01/14/2018  . Stress incontinence 09/03/2017  . Type 2  diabetes mellitus without complication, without long-term current use of insulin ([place]) 09/03/2017  . Seasonal allergic rhinitis 09/03/2017  . Perimenopausal 09/03/2017  . Vaginal discharge 08/10/2016  . Nasal pain 05/08/2016  . Fatigue 05/07/2016  . Vitamin D deficiency 01/05/2016  . Essential hypertension, benign 12/22/2015  . Venous (peripheral) insufficiency 11/05/2015  . Daily headache 03/09/2015  . Elevated liver enzymes 08/26/2012  . Mammographic breast lesion 07/30/2012  . Overweight (BMI 25.0-29.9) 10/15/2011  . Migraine 01/09/2011  . ANEMIA, IRON DEFICIENCY, UNSPEC. 02/12/2007  . GASTROESOPHAGEAL REFLUX, NO ESOPHAGITIS 02/12/2007    Past Surgical History:  Procedure Laterality Date  . CESAREAN SECTION N/A 01/22/2013   Procedure: CESAREAN SECTION;  Surgeon: Donnamae Jude, MD;  Location: [facility];  Service: Obstetrics;  Laterality: N/A;  Repeat  . CESAREAN SECTION  2005  . CESAREAN SECTION  2010  . CHOLECYSTECTOMY  11/28/2011   Procedure: LAPAROSCOPIC CHOLECYSTECTOMY WITH INTRAOPERATIVE CHOLANGIOGRAM;  Surgeon: Imogene Burn. Georgette Dover, MD;  Location: [facility];  Service: General;  Laterality: N/A;  . DILATION AND CURETTAGE OF UTERUS  2009  . female circumcision  82  . TONSILLECTOMY  1989     OB History    Gravida  5   Para  4   Term  4   Preterm      AB  1   Living  3     SAB  1   TAB      Ectopic      Multiple      Live Births  3           Family History  Problem Relation Age of Onset  . Diabetes Mother   . Hypertension Mother   . Hyperlipidemia Mother   . Hypertension Father   . Hyperlipidemia Father   . Prostate cancer Father   . Diabetes Father   . Anesthesia problems Neg Hx   . Hypotension Neg Hx   . Malignant hyperthermia Neg Hx   . Pseudochol deficiency Neg Hx     Social History   Tobacco Use  . Smoking status: Never Smoker  . Smokeless tobacco: Never Used  Substance Use Topics  . Alcohol use: No  . Drug use: No    Home  Medications Prior to Admission medications   Medication Sig Start Date End Date Taking? Authorizing Provider  albuterol (VENTOLIN HFA) 108 (90 Base) MCG/ACT inhaler Inhale 2 puffs into the lungs every 6 (six) hours as needed for wheezing or shortness of breath.    [provider]  amLODipine (NORVASC) 5 MG tablet Take 1 tablet (5 mg total) by mouth daily. 03/11/20   Mitzi Hansen, MD  cetirizine (ZYRTEC) 10 MG tablet Take 10 mg by mouth as needed for allergies.    [provider]  furosemide (LASIX) 20 MG tablet Take 40 mg by mouth daily.  10/27/19   [provider]  hydrochlorothiazide (HYDRODIURIL) 25 MG  tablet Take 1 tablet (25 mg total) by mouth daily. 03/11/20   Elige Radon, MD  levETIRAcetam (KEPPRA) 500 MG tablet Take 1 tablet (500 mg total) by mouth 2 (two) times daily. 03/13/20   Lomax, Amy, NP  levothyroxine (SYNTHROID) 50 MCG tablet Take 50 mcg by mouth daily. 12/30/19   [provider]  losartan (COZAAR) 100 MG tablet Take 100 mg by mouth daily. 01/26/20   [provider]  metFORMIN (GLUCOPHAGE) 1000 MG tablet Take 1,000 mg by mouth daily.  12/30/19   [provider]  propranolol ER (INDERAL LA) 60 MG 24 hr capsule Take 1 capsule (60 mg total) by mouth at bedtime. 02/09/20   Lomax, Amy, NP  Rivaroxaban 15 & 20 MG TBPK Follow package directions: Take one 15mg  tablet by mouth twice a day. On day 22, switch to one 20mg  tablet once a day. Take with food. 03/10/20   , MD  spironolactone (ALDACTONE) 25 MG tablet Take 1 tablet (25 mg total) by mouth daily. 03/11/20   Christian, Rylee, MD  tiZANidine (ZANAFLEX) 4 MG tablet TAKE 1 TABLET (4 MG TOTAL) BY MOUTH EVERY 6 (SIX) HOURS AS NEEDED FOR MUSCLE SPASMS. 08/06/19   03/13/20, MD  traMADol (ULTRAM) 50 MG tablet TAKE 1 TO 2 TABLETS EVERY 6 HOURS FOR PAIN Patient taking differently: Take 50-100 mg by mouth every 6 (six) hours.  02/09/20   Lomax, Amy, NP  Vitamin D,  Ergocalciferol, (DRISDOL) 50000 units CAPS capsule TAKE 1 CAPSULE BY MOUTH ONCE A WEEK. FOR 8 WEEKS Patient taking differently: Take 50,000 Units by mouth every 7 (seven) days. Sundays 07/17/18   Diallo, 03-30-1976, MD  zolpidem (AMBIEN) 5 MG tablet Take 1 tablet (5 mg total) by mouth at bedtime as needed for sleep. 03/04/20   Lilia Argue, MD    Allergies    Relpax [eletriptan], Oxycodone, and Phenergan [promethazine hcl]  Review of Systems   Review of Systems  Constitutional: Positive for fatigue. Negative for chills and fever.  HENT: Negative for congestion.   Respiratory: Negative for shortness of breath.   Cardiovascular: Negative for chest pain.  Gastrointestinal: Positive for nausea and vomiting. Negative for abdominal distention and abdominal pain.  Musculoskeletal: Positive for neck pain.  Neurological: Positive for headaches. Negative for dizziness.    Physical Exam Updated Vital Signs BP 136/67 (BP Location: Right Arm)   Pulse 68   Temp 97.8 F (36.6 C) (Tympanic) Comment (Src): tympanic obtained due to pt mouth breathing & recent emesis  Resp 16   Ht 5\' 6"  (1.676 m)   Wt 72.6 kg   SpO2 100%   BMI 25.82 kg/m   Physical Exam Vitals and nursing note reviewed.  Constitutional:      General: She is not in acute distress. HENT:     Head: Normocephalic and atraumatic.     Nose: Nose normal.     Mouth/Throat:     Mouth: Mucous membranes are dry.  Eyes:     General: No scleral icterus. Neck:     Comments: For range of motion of neck.  No neck rigidity.  Negative presents here.  There is some left lateral/posterior paracervical muscular tenderness with 2 trigger points. Cardiovascular:     Rate and Rhythm: Normal rate and regular rhythm.     Pulses: Normal pulses.     Heart sounds: Normal heart sounds.  Pulmonary:     Effort: Pulmonary effort is normal. No respiratory distress.     Breath sounds:  No wheezing.  Abdominal:     Palpations: Abdomen is soft.      Tenderness: There is no abdominal tenderness.  Musculoskeletal:     Cervical back: Normal range of motion.     Right lower leg: No edema.     Left lower leg: No edema.  Skin:    General: Skin is warm and dry.     Capillary Refill: Capillary refill takes less than 2 seconds.  Neurological:     Mental Status: She is alert. Mental status is at baseline.     Comments: Alert and oriented to self, place, time and event.   Speech is fluent, clear without dysarthria or dysphasia.   Strength 5/5 in upper/lower extremities  Sensation intact in upper/lower extremities   Normal gait.  Negative Romberg. No pronator drift.  Normal finger-to-nose and feet tapping.  CN I not tested  CN II grossly intact visual fields bilaterally. Did not visualize posterior eye.   CN III, IV, VI PERRLA and EOMs intact bilaterally  CN V Intact sensation to sharp and light touch to the face  CN VII facial movements symmetric  CN VIII not tested  CN IX, X no uvula deviation, symmetric rise of soft palate  CN XI 5/5 SCM and trapezius strength bilaterally  CN XII Midline tongue protrusion, symmetric L/R movements   Psychiatric:        Mood and Affect: Mood normal.        Behavior: Behavior normal.     Comments: Poor effort with exam.     ED Results / Procedures / Treatments   Labs (all labs ordered are listed, but only abnormal results are displayed) Labs Reviewed  CBC WITH DIFFERENTIAL/PLATELET - Abnormal; Notable for the following components:      Result Value   Hemoglobin 10.9 (*)    HCT 33.7 (*)    MCV 77.5 (*)    MCH 25.1 (*)    RDW 16.4 (*)    All other components within normal limits  COMPREHENSIVE METABOLIC PANEL - Abnormal; Notable for the following components:   Glucose, Bld 147 (*)    Albumin 3.2 (*)    All other components within normal limits  CBG MONITORING, ED - Abnormal; Notable for the following components:   Glucose-Capillary 155 (*)    All other components within normal limits     EKG None  Radiology No results found.  Procedures Procedures (including critical care time)  Medications Ordered in ED Medications  0.9 %  sodium chloride infusion ( Intravenous Stopped 04/23/20 1552)  metoCLOPramide (REGLAN) injection 10 mg (10 mg Intravenous Given 04/23/20 1436)  dexamethasone (DECADRON) injection 8 mg (8 mg Intravenous Given 04/23/20 1435)  diphenhydrAMINE (BENADRYL) injection 25 mg (25 mg Intravenous Given 04/23/20 1443)  ondansetron (ZOFRAN) injection 4 mg (4 mg Intravenous Given 04/23/20 1447)    ED Course  I have reviewed the triage vital signs and the nursing notes.  Pertinent labs & imaging results that were available during my care of the patient were reviewed by me and considered in my medical decision making (see chart for details).  Patient is well-appearing 49 year old female with a past medical history significant for migraines that are frequent she is followed by [facility] for this.  She has been evaluated recently and has a follow-up appointment already scheduled.  She states that her headache is similar to prior migraine headaches.  She states that she has no focal weakness or sensation loss anywhere.  This is  specifically different from what was mentioned in the triage note.  She does endorse some neck pain which she states is similar to her migraine presentations in the past.  She states that his left side and achy in quality.  She states that she has vomited several times which is been nonbloody nonbilious.  She states this is also similar to prior migraines along with photophobia which she states she has a mild form of today.  Clinical Course as of Apr 23 1628  Wynelle Link Apr 23, 2020  1522 CBC with microcytic microchromic anemia.  This is consistent with iron deficiency.  Patient has a history of anemia.  Her baseline hemoglobin is 10 and is 10.9 today.  She denies any lightheadedness or dizziness.  Low suspicion for sideroblastic, lead  poisoning, anemia of chronic disease or thalassemia.   [WF]  1523 CMP without any acute electrolyte abnormalities.  Creatinine within normal limits.   [WF]  1547 Patient reassessed.  She feels significantly improved after Zofran, Benadryl, Reglan.  She is finishing her 1 L normal saline.  She was given 8 mg of Decadron IV.  We will discharge once IV fluids are complete.  Patient will follow up with her neurologist.   [WF]    Clinical Course User Index [WF] Gailen Shelter, [place]   I discussed this case with my attending physician who cosigned this note including patient's presenting symptoms, physical exam, and planned diagnostics and interventions. Attending physician stated agreement with plan or made changes to plan which were implemented.   As patient symptoms have dramatically improved with migraine cocktail we will discharge her at this time.  She passed p.o. challenge.  Is ambulatory.  Continues to be neurologically intact.  On my reassessment she is sitting upright with her husband at bedside.  They are comfortable with discharge at this time.  She is given return precautions.  MDM Rules/Calculators/A&P                       Final Clinical Impression(s) / ED Diagnoses Final diagnoses:  Bad headache  Nausea  Non-intractable vomiting with nausea, unspecified vomiting type    Rx / DC Orders ED Discharge Orders    None       Gailen Shelter, [place] 04/23/20 1630    Pollyann Savoy, MD 04/24/20 [phone]

## 2020-04-23 NOTE — ED Notes (Signed)
IV attempted x1 without success

## 2020-04-23 NOTE — ED Notes (Signed)
Given sprite for fluid challenge  

## 2020-04-23 NOTE — Discharge Instructions (Addendum)
Please use your home Zofran as prescribed.  Please follow-up with your neurologist for reassessment and continued care for your migraines.  Please return to the ED for any new or concerning symptoms.  Please use Tylenol or ibuprofen for pain.  You may use 600 mg ibuprofen every 6 hours or 1000 mg of Tylenol every 6 hours.  You may choose to alternate between the 2.  This would be most effective.  Not to exceed 4 g of Tylenol within 24 hours.  Not to exceed 3200 mg ibuprofen 24 hours.

## 2020-05-29 ENCOUNTER — Ambulatory Visit: Payer: BLUE CROSS/BLUE SHIELD | Attending: Internal Medicine

## 2020-05-29 DIAGNOSIS — Z23 Encounter for immunization: Secondary | ICD-10-CM

## 2020-05-29 NOTE — Progress Notes (Signed)
   Covid-19 Vaccination Clinic  Name:  Karilyn Wind    MRN: [id number] DOB: 09-07-1971  05/29/2020  Ms. Burditt was observed post Covid-19 immunization for 30 minutes based on pre-vaccination screening without incident. She was provided with Vaccine Information Sheet and instruction to access the V-Safe system.   Ms. Gallop was instructed to call 911 with any severe reactions post vaccine: Marland Kitchen Difficulty breathing  . Swelling of face and throat  . A fast heartbeat  . A bad rash all over body  . Dizziness and weakness   Immunizations Administered    Name Date Dose VIS Date Route   Pfizer COVID-19 Vaccine 05/29/2020 11:56 AM 0.3 mL 02/09/2019 Intramuscular   Manufacturer: [employer], [employer]   Lot: [id number]   NDC: [address]-1

## 2020-06-14 ENCOUNTER — Encounter: Payer: Self-pay | Admitting: Family Medicine

## 2020-06-14 ENCOUNTER — Ambulatory Visit: Payer: BLUE CROSS/BLUE SHIELD | Admitting: Family Medicine

## 2020-06-14 VITALS — BP 118/74 | HR 64 | Ht 66.0 in | Wt 170.6 lb

## 2020-06-14 DIAGNOSIS — I6783 Posterior reversible encephalopathy syndrome: Secondary | ICD-10-CM

## 2020-06-14 DIAGNOSIS — G43009 Migraine without aura, not intractable, without status migrainosus: Secondary | ICD-10-CM | POA: Diagnosis not present

## 2020-06-14 MED ORDER — UBRELVY 100 MG PO TABS: 100.0000 mg | ORAL_TABLET | Freq: Every day | ORAL | 11 refills | Status: AC | PRN

## 2020-06-14 NOTE — Progress Notes (Addendum)
PATIENT: Susan Hopkins DOB: 07/20/1971  REASON FOR VISIT: follow up HISTORY FROM: patient  Chief Complaint  Patient presents with  . Follow-up    3 month follow up, did get a 2nd opinion with Dr. Maple Hudson at [facility] and was placed on Nurtec. Wants to remain a patient here if insurance will allow. Headaches have been stable.   Marland Kitchen Room 16    alone      HISTORY OF PRESENT ILLNESS: Today 06/14/20 Susan Hopkins is a 49 y.o. female here today for follow up for PRES. She reports that headaches are fairly stable. She reports having 2-3 headaches per month. She has had 1-2 "really bad headaches. She has light and sound sensitivity as well as nausea with "bad headaches."  She continues propranolol ER  at bedtime. She is no longer taking levetiracetam. She was seen by Dr Maple Hudson with [phone] neurology who suggested she switch levetiracetam to divalproex acid. She was worried about side effects. She reports taking it for about 3 days and had stomach pains and headaches. She was also given Nurtec for abortive therapy. She does not feel that it helps much at all. No seizure activity since last being seen. BP is well managed. She continues close follow up with Dr Elenore Rota, nephrology. She is now seeing hematology due to the PE noted in March. Workup has been unremarkable. She has a CTA scheduled for 7/6. She is followed closely by PCP as well. Metformin controls DM. She reports "last A1C was good".   HISTORY: (copied from my note on 03/13/2020)  Susan Hopkins is a 49 y.o. female here today for follow up. She was admitted to hospital on 3/23 following a progressive headache in setting of HTN and nephrotic syndrome with witnessed seizure. MRI revealed concerns for PRES. She was started on levetiracetam for seizure prevention and treated for HTN. She is followed by Dr Elenore Rota for DM with nephropathy and nephrotic syndrome. Patient declined renal biopsy in hospital but plans to discuss with Dr Elenore Rota. CT on 3/27 showed  improving PRES.   She reports feeling better since discharge on 3/27. Headaches have improved. She continues propranolol ER  at bedtime. She uses tizanidine and tramadol as needed for abortive therapy. She has monitored BP at home and reports normal readings. She has limited sodium intake. She notes swelling in lower extremities has improved. No shob or chest pain. She is tolerating Xarelto well. No unusual bleeding.   HISTORY: (copied from my note on 02/08/2019)  Susan Mohammedis a 49 y.o.femalehere today for follow up of migraines. She continues propranolol  daily. She was previously taking Ajovy but was switched to Amovig.She stopped Amovig several months ago as her copay card would no longer work and she could not afford medication. She reports that migraines are well managed. She may have 1-2 migraine days per month. She was taking diclofenac for abortive therapy but this was discontinued due to chronic kidney disease with proteinuria. She uses tramadol  as needed for abortive therapy with significant relief. She last refilled 30 tablets in 05/2019. She uses this medication sparingly.   She reports that over the past month she has had more headaches. She describes a tension style headache behind her eyes, frontal and maxillary sinus region. Pain radiates to jaw intermittently. She does endorse intermittent congestion, ears feel full and pop, itchy, and watery eyes. She took Zyrtec 1-2 times but not consistently. She denies fever, cough, chills or body aches.  HISTORY: (copied fromDr Ahern'snote on 07/28/2018)  Interval history 07/28/2018: She has not started the Aimovig yet, she has it at home. She can stop the Nortriptyline. She is having side effects to the Nortriptyline. Recommend decreasing Nortriptyline to 10mg  and then posisble stopping a month after Aimovig. She is not using the OTC medications. She is still on propranolol. Failed Topamax. She has 4 migraines a month, she  takes diclofenac and zofran, can last days untreated. Doing well otherwise.  Interval history 01/26/2018: Migraines have worsened. Started Ajovy. Migraines are more frequent, she is using tylenol, ibuprofen, tramadol and diclofenac every day. She is using excedrin and multiple other medications. Counseled her on Medication Overuse. Had a long discussion, provided literature to read. Continue Ajovy.   Interval history 2018: She has migraines 4x a week. They can last 2 days. She stopped Nortriptyline and the headaches got worse. She has less than 10 headache free days, 10 migraine days a month. 4 days are severe migrainous and a few other days are migrainous but mild. Worse if she forgets the Nortriptyline. Propranolol also helps esp with palpitations, she feels worse if she forgets. She is feeling very depressed, she cries in the office today. She feels lonely, she doesn't have any family here and it is hard to raise children here. She feels sad most days. She cries sometimes at home. No suicidal ideation. She has friends here, she has a close friend here. She has had these feelings for a long time. She has an appointment with her pcp next week.   Interval 05/20/2017:Patient returns today for follow-up of migraines on nortriptyline and propranolol. Had side effects to Topamax. We were unable to increase her nortriptyline due to side effects. She takes Phenergan and diclofenac for acute management.In one month she has 2-3 migraines and if she takes the diclofenac and phenergan during the aura she does not get the migraines and she sleeps well and she is fine. She is doing well on the Nortriptyline and propranolol. She has tiredness to the nortriptyline, slowly titrate off. Every week go down a pill. Will stay on the propranolol. Will try Cambia instead of diclofenac.  Interval history 12/19/2016:Patient is here for follow-up of chronic migraines. She is on nortriptyline and propranolol. Topamax was tried  in the past the patient had side effects. Propranolol was increased at last appointment to 60 mg a day. We tried to increase the nortriptyline in the past but had joint pain. She had side effects to trip him. She takes Phenergan for headaches and diclofenac, I have discussed rebound headache and need to limit these. Relpax gave her chest pain and tightness. Amitriptyline made her too drowsy increased caffeine makes her headaches worse. Imitrex caused her neck and shoulder pain. Patient's headaches are described as throbbing, light sensitivity, sound sensitivity, neck and shoulder muscular pain, mostly on the right but can be on the left. She has been sleeping more and drinking caffeine which are triggers. She is fine today, no headache but has been suffering on and off for the last 3 weeks with migraines. They start after having coffee, would switch to decaf. And she has pain behind the eyes. She is having pain right now.   She was doing well for 4-5 months since last being seen. She started having a headache 3 weeks ago, she is having a lot of aura (does not always have an aura). 3 weeks ago had migraines all weekend. She has increased her Nortriptyline to 3 pills at night and propranolol to 20mg  tid. Headaches for  the last 5 days as well.   Medications tried: Nortriptyline(on low dose could not increase doe),Amitriptyline(too sleepy),propranolol(on ER currently), Topamax, Relpax(chest pain and tightness), amitriptyline, Imitrex, Phenergananddiclofenacfor acute management.  Interval history 05/25/2016: She is on nortriptyline and propranolol and this has helped with the migraines. She is still having chronic migraines 4-5 in the last 2 weeks but she has been fasting and thinks dehydration is affecting. Before this the headaches were better she was having them 1-2x a month. For 3 months she had no headaches at all. Really just an increase in the last few weeks likely due to fasting. She takes cambia.  She was sensitive to imitrex and relpax. With the relpax her blood pressure went up and she had jaw pain. She went to the emergency room, tongue swelling. She tried to increase the Nortriptyline and had joint pain. She is on  mortriptyline. She had side effects to trokendi. She takes phenergan for the headaches. She takes diclofenac 4-5x a week no more (can cause rebound headache)  Interval history 11/28/2015; She has worsening headaches. She is crying today, in the last 3 weeks over 8 migraines. Relpax gave her side effects, chest pain and tightness. She started Amitriptyline, she didn't have a headache for a few months. She was having insomnia and amitrip helped a lot but made her too drowy. Sleep and increased caffeine makes her headaches worse. Yesterday she had a very bad migraine. She has had more than 8 migraines in the last 3 weeks.   HPI: Luevenia Mcavoy is a 49 y.o. female here as a referral from Dr. Jimmey Ralph for Migraine  Migraines started 7 years ago. Used to have it 1-2x a year. Was worse with pregnancy. This last month she has pain on the right side of the head continuously. Throbbing. endorses Light sensitivity, sound sensitivity. Has wavy light aura. She has them 2x a month. This month she has had it a at a low level all month, always there. She takes tylenol, ibuprofen daily. She has neck and shoulder muscular pain. Imitrex doesn't work, makes her shoulder sore, hands hurt. She has nausea and vomiting with the headaches. She went back to school this month, that may be exacerbating the headaches. She has a new glasses prescription. She has 4 kids. No more kids, has an IUD. She gets to sleep but everything wakes her up. She can't nap during the day. No snoring. Migraine can be severe. Can be 10/10. 8-10 hours.   REVIEW OF SYSTEMS: Out of a complete 14 system review of symptoms, the patient complains only of the following symptoms, headaches and all other reviewed systems are  negative.  ALLERGIES: Allergies  Allergen Reactions  . Relpax [Eletriptan] Shortness Of Breath    Throat swelling  . Oxycodone Itching    All over the body  . Phenergan [Promethazine Hcl]     Restless, anxious    HOME MEDICATIONS: Outpatient Medications Prior to Visit  Medication Sig Dispense Refill  . albuterol (VENTOLIN HFA) 108 (90 Base) MCG/ACT inhaler Inhale 2 puffs into the lungs every 6 (six) hours as needed for wheezing or shortness of breath.    . cetirizine (ZYRTEC) 10 MG tablet Take 10 mg by mouth as needed for allergies.    . furosemide (LASIX) 20 MG tablet Take 40 mg by mouth daily.     . hydrochlorothiazide (HYDRODIURIL) 25 MG tablet Take 1 tablet (25 mg total) by mouth daily. 30 tablet 0  . levothyroxine (SYNTHROID) 50 MCG tablet Take 50  mcg by mouth daily.    Marland Kitchen losartan (COZAAR) 100 MG tablet Take 100 mg by mouth daily.    . metFORMIN (GLUCOPHAGE) 1000 MG tablet Take 1,000 mg by mouth daily.     . NURTEC 75 MG TBDP Take by mouth.    . propranolol ER (INDERAL LA) 60 MG 24 hr capsule Take 1 capsule (60 mg total) by mouth at bedtime. 90 capsule 3  . Rivaroxaban 15 & 20 MG TBPK Follow package directions: Take one 15mg  tablet by mouth twice a day. On day 22, switch to one 20mg  tablet once a day. Take with food. 51 each 0  . spironolactone (ALDACTONE) 25 MG tablet Take 1 tablet (25 mg total) by mouth daily. 30 tablet 0  . tiZANidine (ZANAFLEX) 4 MG tablet TAKE 1 TABLET (4 MG TOTAL) BY MOUTH EVERY 6 (SIX) HOURS AS NEEDED FOR MUSCLE SPASMS. 30 tablet 11  . traMADol (ULTRAM) 50 MG tablet TAKE 1 TO 2 TABLETS EVERY 6 HOURS FOR PAIN (Patient taking differently: Take 50-100 mg by mouth every 6 (six) hours. ) 30 tablet 0  . Vitamin D, Ergocalciferol, (DRISDOL) 50000 units CAPS capsule TAKE 1 CAPSULE BY MOUTH ONCE A WEEK. FOR 8 WEEKS (Patient taking differently: Take 50,000 Units by mouth every 7 (seven) days. Sundays) 8 capsule 0  . zolpidem (AMBIEN) 5 MG tablet Take 1 tablet (5 mg  total) by mouth at bedtime as needed for sleep. 10 tablet 0  . amLODipine (NORVASC) 5 MG tablet Take 1 tablet (5 mg total) by mouth daily. 30 tablet 0  . levETIRAcetam (KEPPRA) 500 MG tablet Take 1 tablet (500 mg total) by mouth 2 (two) times daily. 60 tablet 2   No facility-administered medications prior to visit.    PAST MEDICAL HISTORY: Past Medical History:  Diagnosis Date  . ADVANCED MATERNAL AGE 52/02/2008  . ANEMIA, IRON DEFICIENCY, UNSPEC. 02/12/2007   after IUD  . Constipation   . Contact dermatitis 08/27/2011  . Diabetes mellitus without complication (HCC)    gestational diab. currently & with infant #2  . DIABETES MELLITUS, GESTATIONAL, HX OF 05/18/2008  . Diabetes mellitus, type II (HCC)    Hbg A1C 6.9 after pregancy, now on Metformin  . Diarrhea   . 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 Gastric ulcer    history of  . GERD (gastroesophageal reflux disease)    doesn't take anything for acid reflux  . Gestational diabetes A2/B 07/20/2012   Fetal echo-nml-under media tab, needs baseline labs-nml with nml TSH and 24 hour urine-68mg . To see optho in Nov 2013-Per pt. Ok, serial u/s for growth, 2x/wk testing at 32 wks.   . Hemorrhoids   . 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 IUD complication (HCC) 07/28/2015  . Migraine headache    last migraine in Sept 2012    PAST SURGICAL HISTORY: Past Surgical History:  Procedure Laterality Date  . CESAREAN SECTION N/A 01/22/2013   Procedure: CESAREAN SECTION;  Surgeon: 12-20-2000, MD;  Location: [facility];  Service: Obstetrics;  Laterality: N/A;  Repeat  . CESAREAN SECTION  2005  . CESAREAN SECTION  2010  . CHOLECYSTECTOMY  11/28/2011   Procedure: LAPAROSCOPIC CHOLECYSTECTOMY WITH INTRAOPERATIVE CHOLANGIOGRAM;  Surgeon: 2011. 11/30/2011, MD;  Location: [facility] OR;  Service: General;  Laterality: N/A;  . DILATION AND CURETTAGE OF UTERUS  2009  . female circumcision  61  . TONSILLECTOMY   1989    FAMILY HISTORY: Family History  Problem Relation Age of Onset  . Diabetes Mother   . Hypertension Mother   . Hyperlipidemia Mother   . Hypertension Father   . Hyperlipidemia Father   . Prostate cancer Father   . Diabetes Father   . Anesthesia problems Neg Hx   . Hypotension Neg Hx   . Malignant hyperthermia Neg Hx   . Pseudochol deficiency Neg Hx     SOCIAL HISTORY: Social History   Socioeconomic History  . Marital status: Married    Spouse name: [name]  . Number of children: 4  . Years of education: Ba  . Highest education level: Not on file  Occupational History  . Occupation: [employer]  Tobacco Use  . Smoking status: Never Smoker  . Smokeless tobacco: Never Used  Vaping Use  . Vaping Use: Never used  Substance and Sexual Activity  . Alcohol use: No  . Drug use: No  . Sexual activity: Yes    Birth control/protection: I.U.D.  Other Topics Concern  . Not on file  Social History Narrative   Lives at home with husband and 4 kids.   Right handed.    Caffeine use: Coffee (1-2 cups/day)   Social Determinants of Health   Financial Resource Strain:   . Difficulty of Paying Living Expenses:   Food Insecurity:   . Worried About Programme researcher, broadcasting/film/video in the Last Year:   . Barista in the Last Year:   Transportation Needs:   . Freight forwarder (Medical):   Marland Kitchen Lack of Transportation (Non-Medical):   Physical Activity:   . Days of Exercise per Week:   . Minutes of Exercise per Session:   Stress:   . Feeling of Stress :   Social Connections:   . Frequency of Communication with Friends and Family:   . Frequency of Social Gatherings with Friends and Family:   . Attends Religious Services:   . Active Member of Clubs or Organizations:   . Attends Banker Meetings:   Marland Kitchen Marital Status:   Intimate Partner Violence:   . Fear of Current or Ex-Partner:   . Emotionally Abused:   Marland Kitchen Physically Abused:   . Sexually Abused:       PHYSICAL  EXAM  Vitals:   06/14/20 1109  BP: 118/74  Pulse: 64  Weight: 170 lb 9.6 oz (77.4 kg)  Height: 5\' 6"  (1.676 m)   Body mass index is 27.54 kg/m.  Generalized: Well developed, in no acute distress  Cardiology: normal rate and rhythm, no murmur noted Respiratory: clear to auscultation bilaterally  Neurological examination  Mentation: Alert oriented to time, place, history taking. Follows all commands speech and language fluent Cranial nerve II-XII: Pupils were equal round reactive to light. Extraocular movements were full, visual field were full mities. Good symmetric motor tone is noted throughout.  Gait and station: Gait is normal. .   DIAGNOSTIC DATA (LABS, IMAGING, TESTING) - I reviewed patient records, labs, notes, testing and imaging myself where available.  No flowsheet data found.   Lab Results  Component Value Date   WBC 4.2 04/23/2020   HGB 10.9 (L) 04/23/2020   HCT 33.7 (L) 04/23/2020   MCV 77.5 (L) 04/23/2020   PLT 347 04/23/2020      Component Value Date/Time   NA 137 04/23/2020 1453   NA 140 03/09/2015 1356   K 3.5 04/23/2020 1453   CL 103 04/23/2020 1453   CO2 23 04/23/2020 1453   GLUCOSE 147 (H)  04/23/2020 1453   BUN 12 04/23/2020 1453   BUN 8 03/09/2015 1356   CREATININE 0.64 04/23/2020 1453   CREATININE 0.45 (L) 10/29/2016 1542   CALCIUM 9.3 04/23/2020 1453   PROT 6.9 04/23/2020 1453   PROT 7.0 03/09/2015 1356   ALBUMIN 3.2 (L) 04/23/2020 1453   ALBUMIN 4.2 03/09/2015 1356   AST 15 04/23/2020 1453   ALT 15 04/23/2020 1453   ALKPHOS 48 04/23/2020 1453   BILITOT 0.6 04/23/2020 1453   BILITOT 0.2 03/09/2015 1356   GFRNONAA >60 04/23/2020 1453   GFRAA >60 04/23/2020 1453   No results found for: CHOL, HDL, LDLCALC, LDLDIRECT, TRIG, CHOLHDL Lab Results  Component Value Date   HGBA1C 6.8 (H) 03/08/2020   Lab Results  Component Value Date   VITAMINB12 336 05/07/2016   Lab Results  Component Value Date   TSH 2.040 05/04/2018        ASSESSMENT AND PLAN 49 y.o. year old female  has a past medical history of ADVANCED MATERNAL AGE (05/18/2008), ANEMIA, IRON DEFICIENCY, UNSPEC. 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 here with     ICD-10-CM   1. Migraine without aura and without status migrainosus, not intractable  G43.009   2. PRES (posterior reversible encephalopathy syndrome)  I67.83     Lynnell Grainuha is doing well today. Headaches have improved significantly. She continues propranolol 60mg  daily. We will continue current treatment plan. I will call in Ubrelvy for abortive therapy as Nurtec has not been effective. May consider use of Tramadol if needed but she was advised that she should avoid regular use. She was encouraged to stay well hydrated. Well balanced diet and regular exercise advised. She will continue close follow up with treatment team. She will return to see me in 6 months. She verbalizes understanding and agreement with this plan.    No orders of the defined types were placed in this encounter.    Meds ordered this encounter  Medications  . Ubrogepant (UBRELVY) 100 MG TABS    Sig: Take 100 mg by mouth daily as needed. Take one tablet at onset of headache, may repeat 1 tablet in 2 hours, no more than 2 tablets in 24 hours    Dispense:  10 tablet    Refill:  11    Order Specific Question:   Supervising Provider    Answer:   Anson FretAHERN, ANTONIA B [[id number]]      I spent 15 minutes with the patient. 50% of this time was spent counseling and educating patient on plan of  care and medications.    Shawnie Dappermy Chanin Frumkin, FNP-C 06/14/2020, 2:32 PM [facility] [address], [address] [place], [place] [address] [phone]  Made any corrections needed, and agree with history, physical, neuro exam,assessment and plan as stated.     Naomie DeanAntonia Ahern, MD [facility]

## 2020-06-14 NOTE — Patient Instructions (Signed)
We will continue propranolol 60mg  daily.   We will try Ubrelvy for abortive care (to stop a headache). Take 1 tablet at onset. May repeat 1 tablet, if needed, within 2 hours. No more than 2 tablet in 24 hours or 10 tablets a month. Try to avoid taking any abortive medication regularly.   Stay well hydrated. Follow up closely with Dr , Dr Elenore Rota and PCP.   Follow up in 6 months, sooner if needed   Migraine Headache A migraine headache is a very strong throbbing pain on one side or both sides of your head. This type of headache can also cause other symptoms. It can last from 4 hours to 3 days. Talk with your doctor about what things may bring on (trigger) this condition. What are the causes? The exact cause of this condition is not known. This condition may be triggered or caused by:  Drinking alcohol.  Smoking.  Taking medicines, such as: ? Medicine used to treat chest pain (nitroglycerin). ? Birth control pills. ? Estrogen. ? Some blood pressure medicines.  Eating or drinking certain products.  Doing physical activity. Other things that may trigger a migraine headache include:  Having a menstrual period.  Pregnancy.  Hunger.  Stress.  Not getting enough sleep or getting too much sleep.  Weather changes.  Tiredness (fatigue). What increases the risk?  Being 78-45 years old.  Being female.  Having a family history of migraine headaches.  Being Caucasian.  Having depression or anxiety.  Being very overweight. What are the signs or symptoms?  A throbbing pain. This pain may: ? Happen in any area of the head, such as on one side or both sides. ? Make it hard to do daily activities. ? Get worse with physical activity. ? Get worse around bright lights or loud noises.  Other symptoms may include: ? Feeling sick to your stomach (nauseous). ? Vomiting. ? Dizziness. ? Being sensitive to bright lights, loud noises, or smells.  Before you get a migraine  headache, you may get warning signs (an aura). An aura may include: ? Seeing flashing lights or having blind spots. ? Seeing bright spots, halos, or zigzag lines. ? Having tunnel vision or blurred vision. ? Having numbness or a tingling feeling. ? Having trouble talking. ? Having weak muscles.  Some people have symptoms after a migraine headache (postdromal phase), such as: ? Tiredness. ? Trouble thinking (concentrating). How is this treated?  Taking medicines that: ? Relieve pain. ? Relieve the feeling of being sick to your stomach. ? Prevent migraine headaches.  Treatment may also include: ? Having acupuncture. ? Avoiding foods that bring on migraine headaches. ? Learning ways to control your body functions (biofeedback). ? Therapy to help you know and deal with negative thoughts (cognitive behavioral therapy). Follow these instructions at home: Medicines  Take over-the-counter and prescription medicines only as told by your doctor.  Ask your doctor if the medicine prescribed to you: ? Requires you to avoid driving or using heavy machinery. ? Can cause trouble pooping (constipation). You may need to take these steps to prevent or treat trouble pooping:  Drink enough fluid to keep your pee (urine) pale yellow.  Take over-the-counter or prescription medicines.  Eat foods that are high in fiber. These include beans, whole grains, and fresh fruits and vegetables.  Limit foods that are high in fat and sugar. These include fried or sweet foods. Lifestyle  Do not drink alcohol.  Do not use any products that contain  nicotine or tobacco, such as cigarettes, e-cigarettes, and chewing tobacco. If you need help quitting, ask your doctor.  Get at least 8 hours of sleep every night.  Limit and deal with stress. General instructions      Keep a journal to find out what may bring on your migraine headaches. For example, write down: ? What you eat and drink. ? How much sleep  you get. ? Any change in what you eat or drink. ? Any change in your medicines.  If you have a migraine headache: ? Avoid things that make your symptoms worse, such as bright lights. ? It may help to lie down in a dark, quiet room. ? Do not drive or use heavy machinery. ? Ask your doctor what activities are safe for you.  Keep all follow-up visits as told by your doctor. This is important. Contact a doctor if:  You get a migraine headache that is different or worse than others you have had.  You have more than 15 headache days in one month. Get help right away if:  Your migraine headache gets very bad.  Your migraine headache lasts longer than 72 hours.  You have a fever.  You have a stiff neck.  You have trouble seeing.  Your muscles feel weak or like you cannot control them.  You start to lose your balance a lot.  You start to have trouble walking.  You pass out (faint).  You have a seizure. Summary  A migraine headache is a very strong throbbing pain on one side or both sides of your head. These headaches can also cause other symptoms.  This condition may be treated with medicines and changes to your lifestyle.  Keep a journal to find out what may bring on your migraine headaches.  Contact a doctor if you get a migraine headache that is different or worse than others you have had.  Contact your doctor if you have more than 15 headache days in a month. This information is not intended to replace advice given to you by your health care provider. Make sure you discuss any questions you have with your health care provider. Document Revised: 03/26/2019 Document Reviewed: 01/14/2019 Elsevier Patient Education  2020 [employer].

## 2020-06-19 ENCOUNTER — Ambulatory Visit: Payer: BLUE CROSS/BLUE SHIELD | Attending: Internal Medicine

## 2020-06-19 DIAGNOSIS — Z23 Encounter for immunization: Secondary | ICD-10-CM

## 2020-06-19 NOTE — Progress Notes (Signed)
   Covid-19 Vaccination [facility]  Name:  Susan Hopkins    MRN: [id number] DOB: [birth year]  06/19/2020  Susan Hopkins was observed post Covid-19 immunization for 15 minutes without incident. She was provided with Vaccine Information Sheet and instruction to access the V-Safe system.   Susan Hopkins was instructed to call 911 with any severe reactions post vaccine: Marland Kitchen Difficulty breathing  . Swelling of face and throat  . A fast heartbeat  . A bad rash all over body  . Dizziness and weakness   Immunizations Administered    Name Date Dose VIS Date Route   Pfizer COVID-19 Vaccine 06/19/2020  1:17 PM 0.3 mL 02/09/2019 Intramuscular   Manufacturer: [employer], [employer]   Lot: [id number]   NDC: [address]-7      Covid-[facility] Vaccination [facility]  Name:  Susan Hopkins    MRN: [id number] DOB: 1971-12-05  06/19/2020  Susan Hopkins was observed post Covid-19 immunization for 15 minutes without incident. She was provided with Vaccine Information Sheet and instruction to access the V-Safe system.   Susan Hopkins was instructed to call 911 with any severe reactions post vaccine: Marland Kitchen Difficulty breathing  . Swelling of face and throat  . A fast heartbeat  . A bad rash all over body  . Dizziness and weakness   Immunizations Administered    Name Date Dose VIS Date Route   Pfizer COVID-19 Vaccine 06/19/2020  1:17 PM 0.3 mL 02/09/2019 Intramuscular   Manufacturer: [employer], [employer]   Lot: [id number]   NDC: [address]-4

## 2020-07-11 ENCOUNTER — Telehealth: Payer: Self-pay | Admitting: Family Medicine

## 2020-07-11 NOTE — Telephone Encounter (Signed)
Received PA from pharmacy for Ubrelvy. PA started on [email or web address]. Key is [id number]. PA was instantly approved from 07/11/20 to 10/02/20.   Pharmacy is aware of the approval.

## 2020-09-28 ENCOUNTER — Telehealth: Payer: Self-pay | Admitting: Family Medicine

## 2020-09-28 NOTE — Telephone Encounter (Signed)
Received a PA renewal for Ubrelvy 100mg  tablets. PA was started on . Key is [id number]. Per CMM, will receive a determination within the next 72 hours.   Will check back later for a response.

## 2020-10-02 NOTE — Telephone Encounter (Signed)
Received fax from [facility] stating the Susan Hopkins was approved. PA has been approved 09/28/20 to 09/27/21. Will fax a copy of the approval to patient's pharmacy.

## 2020-12-19 ENCOUNTER — Ambulatory Visit: Payer: BLUE CROSS/BLUE SHIELD | Admitting: Family Medicine

## 2020-12-19 ENCOUNTER — Encounter: Payer: Self-pay | Admitting: Family Medicine

## 2020-12-19 NOTE — Patient Instructions (Incomplete)
Below is our plan:  We will ***  Please make sure you are staying well hydrated. I recommend 50-60 ounces daily. Well balanced diet and regular exercise encouraged.    Please continue follow up with care team as directed.   Follow up with *** in ***  You may receive a survey regarding today's visit. I encourage you to leave honest feed back as I do use this information to improve patient care. Thank you for seeing me today!      Migraine Headache A migraine headache is a very strong throbbing pain on one side or both sides of your head. This type of headache can also cause other symptoms. It can last from 4 hours to 3 days. Talk with your doctor about what things may bring on (trigger) this condition. What are the causes? The exact cause of this condition is not known. This condition may be triggered or caused by:  Drinking alcohol.  Smoking.  Taking medicines, such as: ? Medicine used to treat chest pain (nitroglycerin). ? Birth control pills. ? Estrogen. ? Some blood pressure medicines.  Eating or drinking certain products.  Doing physical activity. Other things that may trigger a migraine headache include:  Having a menstrual period.  Pregnancy.  Hunger.  Stress.  Not getting enough sleep or getting too much sleep.  Weather changes.  Tiredness (fatigue). What increases the risk?  Being 25-55 years old.  Being female.  Having a family history of migraine headaches.  Being Caucasian.  Having depression or anxiety.  Being very overweight. What are the signs or symptoms?  A throbbing pain. This pain may: ? Happen in any area of the head, such as on one side or both sides. ? Make it hard to do daily activities. ? Get worse with physical activity. ? Get worse around bright lights or loud noises.  Other symptoms may include: ? Feeling sick to your stomach (nauseous). ? Vomiting. ? Dizziness. ? Being sensitive to bright lights, loud noises, or  smells.  Before you get a migraine headache, you may get warning signs (an aura). An aura may include: ? Seeing flashing lights or having blind spots. ? Seeing bright spots, halos, or zigzag lines. ? Having tunnel vision or blurred vision. ? Having numbness or a tingling feeling. ? Having trouble talking. ? Having weak muscles.  Some people have symptoms after a migraine headache (postdromal phase), such as: ? Tiredness. ? Trouble thinking (concentrating). How is this treated?  Taking medicines that: ? Relieve pain. ? Relieve the feeling of being sick to your stomach. ? Prevent migraine headaches.  Treatment may also include: ? Having acupuncture. ? Avoiding foods that bring on migraine headaches. ? Learning ways to control your body functions (biofeedback). ? Therapy to help you know and deal with negative thoughts (cognitive behavioral therapy). Follow these instructions at home: Medicines  Take over-the-counter and prescription medicines only as told by your doctor.  Ask your doctor if the medicine prescribed to you: ? Requires you to avoid driving or using heavy machinery. ? Can cause trouble pooping (constipation). You may need to take these steps to prevent or treat trouble pooping:  Drink enough fluid to keep your pee (urine) pale yellow.  Take over-the-counter or prescription medicines.  Eat foods that are high in fiber. These include beans, whole grains, and fresh fruits and vegetables.  Limit foods that are high in fat and sugar. These include fried or sweet foods. Lifestyle  Do not drink alcohol.  Do   not use any products that contain nicotine or tobacco, such as cigarettes, e-cigarettes, and chewing tobacco. If you need help quitting, ask your doctor.  Get at least 8 hours of sleep every night.  Limit and deal with stress. General instructions      Keep a journal to find out what may bring on your migraine headaches. For example, write down: ? What  you eat and drink. ? How much sleep you get. ? Any change in what you eat or drink. ? Any change in your medicines.  If you have a migraine headache: ? Avoid things that make your symptoms worse, such as bright lights. ? It may help to lie down in a dark, quiet room. ? Do not drive or use heavy machinery. ? Ask your doctor what activities are safe for you.  Keep all follow-up visits as told by your doctor. This is important. Contact a doctor if:  You get a migraine headache that is different or worse than others you have had.  You have more than 15 headache days in one month. Get help right away if:  Your migraine headache gets very bad.  Your migraine headache lasts longer than 72 hours.  You have a fever.  You have a stiff neck.  You have trouble seeing.  Your muscles feel weak or like you cannot control them.  You start to lose your balance a lot.  You start to have trouble walking.  You pass out (faint).  You have a seizure. Summary  A migraine headache is a very strong throbbing pain on one side or both sides of your head. These headaches can also cause other symptoms.  This condition may be treated with medicines and changes to your lifestyle.  Keep a journal to find out what may bring on your migraine headaches.  Contact a doctor if you get a migraine headache that is different or worse than others you have had.  Contact your doctor if you have more than 15 headache days in a month. This information is not intended to replace advice given to you by your health care provider. Make sure you discuss any questions you have with your health care provider. Document Revised: 03/26/2019 Document Reviewed: 01/14/2019 Elsevier Patient Education  2020 [employer]  

## 2020-12-19 NOTE — Progress Notes (Deleted)
PATIENT: Susan Hopkins DOB: August 01, 1971  REASON FOR VISIT: follow up HISTORY FROM: patient  No chief complaint on file.    HISTORY OF PRESENT ILLNESS: Today 12/19/20   She returns for follow up on migraines. She continues propranolol LA 60mg  daily. Per Dr Conrad  last note ([facility]), she was given Reglan and Fioricet for abortive therapy.   History (copied from previous notes)  Susan Hopkins is a 50 y.o. female here today for follow up for PRES. She reports that headaches are fairly stable. She reports having 2-3 headaches per month. She has had 1-2 "really bad headaches. She has light and sound sensitivity as well as nausea with "bad headaches."  She continues propranolol ER 60mg  at bedtime. She is no longer taking levetiracetam. She was seen by Dr Ermalene Postin with [phone] neurology who suggested she switch levetiracetam to divalproex acid. She was worried about side effects. She reports taking it for about 3 days and had stomach pains and headaches. She was also given Nurtec for abortive therapy. She does not feel that it helps much at all. No seizure activity since last being seen. BP is well managed. She continues close follow up with Dr Audie Clear, nephrology. She is now seeing hematology due to the PE noted in March. Workup has been unremarkable. She has a CTA scheduled for 7/6. She is followed closely by PCP as well. Metformin controls DM. She reports "last A1C was good".   HISTORY: (copied from my note on 03/13/2020)  Susan Hopkins is a 50 y.o. female here today for follow up. She was admitted to hospital on 3/23 following a progressive headache in setting of HTN and nephrotic syndrome with witnessed seizure. MRI revealed concerns for PRES. She was started on levetiracetam for seizure prevention and treated for HTN. 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 Patient declined renal biopsy in hospital but plans to discuss with Dr Audie Clear. CT on 3/27 showed improving  PRES.   She reports feeling better since discharge on 3/27. Headaches have improved. She continues propranolol ER 60mg  at bedtime. She uses tizanidine and tramadol as needed for abortive therapy. She has monitored BP at home and reports normal readings. She has limited sodium intake. She notes swelling in lower extremities has improved. No shob or chest pain. She is tolerating Xarelto well. No unusual bleeding.   HISTORY: (copied from my note on 02/08/2019)  Susan Mohammedis a 50 y.o.femalehere today for follow up of migraines. She continues propranolol 60mg  daily. She was previously taking Ajovy but was switched to [place].She stopped Amovig several months ago as her copay card would no longer work and she could not afford medication. She reports that migraines are well managed. She may have 1-2 migraine days per month. She was taking diclofenac for abortive therapy but this was discontinued due to chronic kidney disease with proteinuria. She uses tramadol 50mg  as needed for abortive therapy with significant relief. She last refilled 30 tablets in 05/2019. She uses this medication sparingly.   She reports that over the past month she has had more headaches. She describes a tension style headache behind her eyes, frontal and maxillary sinus region. Pain radiates to jaw intermittently. She does endorse intermittent congestion, ears feel full and pop, itchy, and watery eyes. She took Zyrtec 1-2 times but not consistently. She denies fever, cough, chills or body aches.  HISTORY: (copied fromDr Ahern'snote on 07/28/2018)  Interval history 07/28/2018: She has not started the [place] yet, she  has it at home. She can stop the Nortriptyline. She is having side effects to the Nortriptyline. Recommend decreasing Nortriptyline to 10mg  and then posisble stopping a month after Aimovig. She is not using the OTC medications. She is still on propranolol. Failed Topamax. She has 4 migraines a month, she takes  diclofenac and zofran, can last days untreated. Doing well otherwise.  Interval history 01/26/2018: Migraines have worsened. Started Ajovy. Migraines are more frequent, she is using tylenol, ibuprofen, tramadol and diclofenac every day. She is using excedrin and multiple other medications. Counseled her on Medication Overuse. Had a long discussion, provided literature to read. Continue Ajovy.   Interval history 2018: She has migraines 4x a week. They can last 2 days. She stopped Nortriptyline and the headaches got worse. She has less than 10 headache free days, 10 migraine days a month. 4 days are severe migrainous and a few other days are migrainous but mild. Worse if she forgets the Nortriptyline. Propranolol also helps esp with palpitations, she feels worse if she forgets. She is feeling very depressed, she cries in the office today. She feels lonely, she doesn't have any family here and it is hard to raise children here. She feels sad most days. She cries sometimes at home. No suicidal ideation. She has friends here, she has a close friend here. She has had these feelings for a long time. She has an appointment with her pcp next week.   Interval 05/20/2017:Patient returns today for follow-up of migraines on nortriptyline and propranolol. Had side effects to Topamax. We were unable to increase her nortriptyline due to side effects. She takes Phenergan and diclofenac for acute management.In one month she has 2-3 migraines and if she takes the diclofenac and phenergan during the aura she does not get the migraines and she sleeps well and she is fine. She is doing well on the Nortriptyline and propranolol. She has tiredness to the nortriptyline, slowly titrate off. Every week go down a pill. Will stay on the propranolol. Will try [place] instead of diclofenac.  Interval history 12/19/2016:Patient is here for follow-up of chronic migraines. She is on nortriptyline and propranolol. Topamax was tried in  the past the patient had side effects. Propranolol was increased at last appointment to 60 mg a day. We tried to increase the nortriptyline in the past but had joint pain. She had side effects to trip him. She takes Phenergan for headaches and diclofenac, I have discussed rebound headache and need to limit these. Relpax gave her chest pain and tightness. Amitriptyline made her too drowsy increased caffeine makes her headaches worse. Imitrex caused her neck and shoulder pain. Patient's headaches are described as throbbing, light sensitivity, sound sensitivity, neck and shoulder muscular pain, mostly on the right but can be on the left. She has been sleeping more and drinking caffeine which are triggers. She is fine today, no headache but has been suffering on and off for the last 3 weeks with migraines. They start after having coffee, would switch to decaf. And she has pain behind the eyes. She is having pain right now.   She was doing well for 4-5 months since last being seen. She started having a headache 3 weeks ago, she is having a lot of aura (does not always have an aura). 3 weeks ago had migraines all weekend. She has increased her Nortriptyline to 3 pills at night and propranolol to 20mg  tid. Headaches for the last 5 days as well.   Medications tried: Nortriptyline(on  low dose could not increase doe),Amitriptyline(too sleepy),propranolol(on ER currently), Topamax, Relpax(chest pain and tightness), amitriptyline, Imitrex, Phenergananddiclofenacfor acute management.  Interval history 05/25/2016: She is on nortriptyline and propranolol and this has helped with the migraines. She is still having chronic migraines 4-5 in the last 2 weeks but she has been fasting and thinks dehydration is affecting. Before this the headaches were better she was having them 1-2x a month. For 3 months she had no headaches at all. Really just an increase in the last few weeks likely due to fasting. She takes cambia. She  was sensitive to imitrex and relpax. With the relpax her blood pressure went up and she had jaw pain. She went to the emergency room, tongue swelling. She tried to increase the Nortriptyline and had joint pain. She is on 20mg  mortriptyline. She had side effects to trokendi. She takes phenergan for the headaches. She takes diclofenac 4-5x a week no more (can cause rebound headache)  Interval history 11/28/2015; She has worsening headaches. She is crying today, in the last 3 weeks over 8 migraines. Relpax gave her side effects, chest pain and tightness. She started Amitriptyline, she didn't have a headache for a few months. She was having insomnia and amitrip helped a lot but made her too drowy. Sleep and increased caffeine makes her headaches worse. Yesterday she had a very bad migraine. She has had more than 8 migraines in the last 3 weeks.   HPI: Susan Hopkins is a 50 y.o. female here as a referral from Dr. 54 for Migraine  Migraines started 7 years ago. Used to have it 1-2x a year. Was worse with pregnancy. This last month she has pain on the right side of the head continuously. Throbbing. endorses Light sensitivity, sound sensitivity. Has wavy light aura. She has them 2x a month. This month she has had it a at a low level all month, always there. She takes tylenol, ibuprofen daily. She has neck and shoulder muscular pain. Imitrex doesn't work, makes her shoulder sore, hands hurt. She has nausea and vomiting with the headaches. She went back to school this month, that may be exacerbating the headaches. She has a new glasses prescription. She has 4 kids. No more kids, has an IUD. She gets to sleep but everything wakes her up. She can't nap during the day. No snoring. Migraine can be severe. Can be 10/10. 8-10 hours.   REVIEW OF SYSTEMS: Out of a complete 14 system review of symptoms, the patient complains only of the following symptoms, headaches and all other reviewed systems are  negative.  ALLERGIES: Allergies  Allergen Reactions  . Relpax [Eletriptan] Shortness Of Breath    Throat swelling  . Oxycodone Itching    All over the body  . Phenergan [Promethazine Hcl]     Restless, anxious    HOME MEDICATIONS: Outpatient Medications Prior to Visit  Medication Sig Dispense Refill  . albuterol (VENTOLIN HFA) 108 (90 Base) MCG/ACT inhaler Inhale 2 puffs into the lungs every 6 (six) hours as needed for wheezing or shortness of breath.    . cetirizine (ZYRTEC) 10 MG tablet Take 10 mg by mouth as needed for allergies.    . furosemide (LASIX) 20 MG tablet Take 40 mg by mouth daily.     . hydrochlorothiazide (HYDRODIURIL) 25 MG tablet Take 1 tablet (25 mg total) by mouth daily. 30 tablet 0  . levothyroxine (SYNTHROID) 50 MCG tablet Take 50 mcg by mouth daily.    Jimmey Ralph losartan (COZAAR) 100  MG tablet Take 100 mg by mouth daily.    . metFORMIN (GLUCOPHAGE) 1000 MG tablet Take 1,000 mg by mouth daily.     . NURTEC 75 MG TBDP Take by mouth.    . propranolol ER (INDERAL LA) 60 MG 24 hr capsule Take 1 capsule (60 mg total) by mouth at bedtime. 90 capsule 3  . Rivaroxaban 15 & 20 MG TBPK Follow package directions: Take one 15mg  tablet by mouth twice a day. On day 22, switch to one 20mg  tablet once a day. Take with food. 51 each 0  . spironolactone (ALDACTONE) 25 MG tablet Take 1 tablet (25 mg total) by mouth daily. 30 tablet 0  . tiZANidine (ZANAFLEX) 4 MG tablet TAKE 1 TABLET (4 MG TOTAL) BY MOUTH EVERY 6 (SIX) HOURS AS NEEDED FOR MUSCLE SPASMS. 30 tablet 11  . traMADol (ULTRAM) 50 MG tablet TAKE 1 TO 2 TABLETS EVERY 6 HOURS FOR PAIN (Patient taking differently: Take 50-100 mg by mouth every 6 (six) hours. ) 30 tablet 0  . Ubrogepant (UBRELVY) 100 MG TABS Take 100 mg by mouth daily as needed. Take one tablet at onset of headache, may repeat 1 tablet in 2 hours, no more than 2 tablets in 24 hours 10 tablet 11  . Vitamin D, Ergocalciferol, (DRISDOL) 50000 units CAPS capsule TAKE 1  CAPSULE BY MOUTH ONCE A WEEK. FOR 8 WEEKS (Patient taking differently: Take 50,000 Units by mouth every 7 (seven) days. Sundays) 8 capsule 0  . zolpidem (AMBIEN) 5 MG tablet Take 1 tablet (5 mg total) by mouth at bedtime as needed for sleep. 10 tablet 0   No facility-administered medications prior to visit.    PAST MEDICAL HISTORY: Past Medical History:  Diagnosis Date  . ADVANCED MATERNAL AGE 57/02/2008  . ANEMIA, IRON DEFICIENCY, UNSPEC. 02/12/2007   after IUD  . Constipation   . Contact dermatitis 08/27/2011  . Diabetes mellitus without complication (HCC)    gestational diab. currently & with infant #2  . DIABETES MELLITUS, GESTATIONAL, HX OF 05/18/2008  . Diabetes mellitus, type II (HCC)    Hbg A1C 6.9 after pregancy, now on Metformin  . Diarrhea   . 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 Gastric ulcer    history of  . GERD (gastroesophageal reflux disease)    doesn't take anything for acid reflux  . Gestational diabetes A2/B 07/20/2012   Fetal echo-nml-under media tab, needs baseline labs-nml with nml TSH and 24 hour urine-68mg . To see optho in Nov 2013-Per pt. Ok, serial u/s for growth, 2x/wk testing at 32 wks.   . Hemorrhoids   . 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 IUD complication (HCC) 07/28/2015  . Migraine headache    last migraine in Sept 2012    PAST SURGICAL HISTORY: Past Surgical History:  Procedure Laterality Date  . CESAREAN SECTION N/A 01/22/2013   Procedure: CESAREAN SECTION;  Surgeon: Reva Boresanya S Pratt, MD;  Location: [facility];  Service: Obstetrics;  Laterality: N/A;  Repeat  . CESAREAN SECTION  2005  . CESAREAN SECTION  2010  . CHOLECYSTECTOMY  11/28/2011   Procedure: LAPAROSCOPIC CHOLECYSTECTOMY WITH INTRAOPERATIVE CHOLANGIOGRAM;  Surgeon: Wilmon ArmsMatthew K. Corliss Skainssuei, MD;  Location: [facility];  Service: General;  Laterality: N/A;  . DILATION AND CURETTAGE OF UTERUS  2009  . female circumcision  [id number]  . TONSILLECTOMY  1989     FAMILY HISTORY: Family History  Problem Relation Age of Onset  . Diabetes Mother   .  Hypertension Mother   . Hyperlipidemia Mother   . Hypertension Father   . Hyperlipidemia Father   . Prostate cancer Father   . Diabetes Father   . Anesthesia problems Neg Hx   . Hypotension Neg Hx   . Malignant hyperthermia Neg Hx   . Pseudochol deficiency Neg Hx     SOCIAL HISTORY: Social History   Socioeconomic History  . Marital status: Married    Spouse name: [name]  . Number of children: 4  . Years of education: Ba  . Highest education level: Not on file  Occupational History  . Occupation: [employer]  Tobacco Use  . Smoking status: Never Smoker  . Smokeless tobacco: Never Used  Vaping Use  . Vaping Use: Never used  Substance and Sexual Activity  . Alcohol use: No  . Drug use: No  . Sexual activity: Yes    Birth control/protection: I.U.D.  Other Topics Concern  . Not on file  Social History Narrative   Lives at home with husband and 4 kids.   Right handed.    Caffeine use: Coffee (1-2 cups/day)   Social Determinants of Health   Financial Resource Strain: Not on file  Food Insecurity: Not on file  Transportation Needs: Not on file  Physical Activity: Not on file  Stress: Not on file  Social Connections: Not on file  Intimate Partner Violence: Not on file      PHYSICAL EXAM  There were no vitals filed for this visit. There is no height or weight on file to calculate BMI.  Generalized: Well developed, in no acute distress  Cardiology: normal rate and rhythm, no murmur noted Respiratory: clear to auscultation bilaterally  Neurological examination  Mentation: Alert oriented to time, place, history taking. Follows all commands speech and language fluent Cranial nerve II-XII: Pupils were equal round reactive to light. Extraocular movements were full, visual field were full mities. Good symmetric motor tone is noted throughout.  Gait and station: Gait is normal. .    DIAGNOSTIC DATA (LABS, IMAGING, TESTING) - I reviewed patient records, labs, notes, testing and imaging myself where available.  No flowsheet data found.   Lab Results  Component Value Date   WBC 4.2 04/23/2020   HGB 10.9 (L) 04/23/2020   HCT 33.7 (L) 04/23/2020   MCV 77.5 (L) 04/23/2020   PLT 347 04/23/2020      Component Value Date/Time   NA 137 04/23/2020 1453   NA 140 03/09/2015 1356   K 3.5 04/23/2020 1453   CL 103 04/23/2020 1453   CO2 23 04/23/2020 1453   GLUCOSE 147 (H) 04/23/2020 1453   BUN 12 04/23/2020 1453   BUN 8 03/09/2015 1356   CREATININE 0.64 04/23/2020 1453   CREATININE 0.45 (L) 10/29/2016 1542   CALCIUM 9.3 04/23/2020 1453   PROT 6.9 04/23/2020 1453   PROT 7.0 03/09/2015 1356   ALBUMIN 3.2 (L) 04/23/2020 1453   ALBUMIN 4.2 03/09/2015 1356   AST 15 04/23/2020 1453   ALT 15 04/23/2020 1453   ALKPHOS 48 04/23/2020 1453   BILITOT 0.6 04/23/2020 1453   BILITOT 0.2 03/09/2015 1356   GFRNONAA >60 04/23/2020 1453   GFRAA >60 04/23/2020 1453   No results found for: CHOL, HDL, LDLCALC, LDLDIRECT, TRIG, CHOLHDL Lab Results  Component Value Date   HGBA1C 6.8 (H) 03/08/2020   Lab Results  Component Value Date   VITAMINB12 336 05/07/2016   Lab Results  Component Value Date   TSH 2.040 05/04/2018  ASSESSMENT AND PLAN 50 y.o. year old female  has a past medical history of ADVANCED MATERNAL AGE (05/18/2008), ANEMIA, IRON DEFICIENCY, UNSPEC. 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 here with   No diagnosis  found.    Anija is doing well today. Headaches have improved significantly. She continues propranolol 60mg  daily. We will continue current treatment plan. I will call in Ubrelvy for abortive therapy as Nurtec has not been effective. May consider use of Tramadol if needed but she was advised that she should avoid regular use. She was encouraged to stay well hydrated. Well balanced diet and regular exercise advised. She will continue close follow up with treatment team. She will return to see me in 6 months. She verbalizes understanding and agreement with this plan.    No orders of the defined types were placed in this encounter.    No orders of the defined types were placed in this encounter.     I spent 15 minutes with the patient. 50% of this time was spent counseling and educating patient on plan of care and medications.     , FNP-C 12/19/2020, 7:35 AM [facility] [address] [place], [place] [phone]  Made any corrections needed, and agree with history, physical, neuro exam,assessment and plan as stated.     [phone], MD [facility]

## 2021-01-02 ENCOUNTER — Ambulatory Visit: Payer: BLUE CROSS/BLUE SHIELD

## 2021-02-08 ENCOUNTER — Ambulatory Visit: Payer: BLUE CROSS/BLUE SHIELD | Admitting: Family Medicine

## 2021-06-17 ENCOUNTER — Emergency Department (HOSPITAL_BASED_OUTPATIENT_CLINIC_OR_DEPARTMENT_OTHER)
Admission: EM | Admit: 2021-06-17 | Discharge: 2021-06-18 | Disposition: A | Discharge status: Home / Self Care | Payer: BLUE CROSS/BLUE SHIELD | Attending: Emergency Medicine | Admitting: Emergency Medicine

## 2021-06-17 ENCOUNTER — Encounter (HOSPITAL_BASED_OUTPATIENT_CLINIC_OR_DEPARTMENT_OTHER): Payer: Self-pay

## 2021-06-17 ENCOUNTER — Other Ambulatory Visit: Payer: Self-pay

## 2021-06-17 DIAGNOSIS — Z7984 Long term (current) use of oral hypoglycemic drugs: Secondary | ICD-10-CM | POA: Insufficient documentation

## 2021-06-17 DIAGNOSIS — Z79899 Other long term (current) drug therapy: Secondary | ICD-10-CM | POA: Insufficient documentation

## 2021-06-17 DIAGNOSIS — G43809 Other migraine, not intractable, without status migrainosus: Secondary | ICD-10-CM | POA: Insufficient documentation

## 2021-06-17 DIAGNOSIS — I10 Essential (primary) hypertension: Secondary | ICD-10-CM | POA: Diagnosis not present

## 2021-06-17 DIAGNOSIS — R002 Palpitations: Secondary | ICD-10-CM | POA: Insufficient documentation

## 2021-06-17 DIAGNOSIS — Z7901 Long term (current) use of anticoagulants: Secondary | ICD-10-CM | POA: Diagnosis not present

## 2021-06-17 DIAGNOSIS — E119 Type 2 diabetes mellitus without complications: Secondary | ICD-10-CM | POA: Insufficient documentation

## 2021-06-17 DIAGNOSIS — R519 Headache, unspecified: Secondary | ICD-10-CM | POA: Diagnosis present

## 2021-06-17 NOTE — ED Triage Notes (Addendum)
Pt is present to the ED for a "pulsating headache" that started yesterday with blurred vision. Pt states she takes 60 MG propanolol PO every evening and ran out of meds two nights ago. Denies N/V or dizziness.

## 2021-06-18 MED ORDER — PROPRANOLOL HCL ER 60 MG PO CP24: 60.0000 mg | ORAL_CAPSULE | Freq: Every day | ORAL | 0 refills | Status: AC

## 2021-06-18 MED ORDER — PROPRANOLOL HCL ER 60 MG PO CP24: 60.0000 mg | ORAL_CAPSULE | Freq: Once | ORAL | Status: DC

## 2021-06-18 NOTE — Discharge Instructions (Addendum)
You were seen today for headache.  Your prescription was sent to the 24-hour pharmacy.  If you notice increased headache or palpitations despite taking her medicines, you should be reevaluated.

## 2021-06-18 NOTE — ED Provider Notes (Signed)
[facility] EMERGENCY DEPT Provider Note   CSN: [id number] Arrival date & time: 06/17/21  2339     History Chief Complaint  Patient presents with   Headache    Susan Hopkins is a 50 y.o. female.  HPI     This is a 50 year old female with a history of migraines, diabetes who presents with headache.  Patient reports that she normally takes propranolol nightly for migraine prophylaxis.  She ran out and has not taken it since Friday.  Yesterday she developed "pulsating" in her head.  She rates her discomfort at 4 out of 10.  She has some light and sensitivity and some nausea.  This is characteristic for her migraines.  She also reports palpitations.  No nausea, vomiting, dizziness.  No chest pain or shortness of breath.  Past Medical History:  Diagnosis Date   ADVANCED MATERNAL AGE 18/02/2008   ANEMIA, IRON DEFICIENCY, UNSPEC. 02/12/2007   after IUD   Constipation    Contact dermatitis 08/27/2011   Diabetes mellitus without complication (HCC)    gestational diab. 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 To see optho in Nov 2013-Per pt. Ok, serial u/s for growth, 2x/wk testing at 32 wks.    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   Status epilepticus (HCC)    Pulmonary embolism (HCC)    Acute  pain of both shoulders 07/03/2018   Tension headache 07/03/2018   Flea bite of lower leg 07/03/2018   Rash and nonspecific skin eruption 06/24/2018   Bite, insect 06/15/2018   Dry skin 01/14/2018   Stress incontinence 09/03/2017   Type 2 diabetes mellitus without complication, without long-term current use of insulin (HCC) 09/03/2017   Seasonal allergic rhinitis 09/03/2017   Perimenopausal 09/03/2017   Vaginal discharge 08/10/2016   Nasal pain 05/08/2016   Fatigue 05/07/2016   Vitamin D deficiency 01/05/2016   Essential hypertension, benign 12/22/2015   Venous (peripheral) insufficiency 11/05/2015   Daily headache 03/09/2015   Elevated liver enzymes 08/26/2012   Mammographic breast lesion 07/30/2012   Overweight (BMI 25.0-29.9) 10/15/2011   Migraine 01/09/2011   ANEMIA, IRON DEFICIENCY, UNSPEC. 02/12/2007   GASTROESOPHAGEAL REFLUX, NO ESOPHAGITIS 02/12/2007    Past Surgical History:  Procedure Laterality Date   CESAREAN SECTION N/A 01/22/2013   Procedure: CESAREAN SECTION;  Surgeon: Reva Bores, MD;  Location: [facility];  Service: Obstetrics;  Laterality: N/A;  Repeat   CESAREAN SECTION  2005   CESAREAN SECTION  2010   CHOLECYSTECTOMY  11/28/2011   Procedure: LAPAROSCOPIC CHOLECYSTECTOMY WITH INTRAOPERATIVE CHOLANGIOGRAM;  Surgeon: Wilmon Arms. Corliss Skains, MD;  Location: [facility] OR;  Service: General;  Laterality: N/A;   DILATION AND CURETTAGE OF UTERUS  2009   female circumcision  78   TONSILLECTOMY  1989  OB History     Gravida  5   Para  4   Term  4   Preterm      AB  1   Living  3      SAB  1   IAB      Ectopic      Multiple      Live Births  3           Family History  Problem Relation Age of Onset   Diabetes Mother    Hypertension Mother    Hyperlipidemia Mother    Hypertension Father    Hyperlipidemia Father    Prostate cancer Father    Diabetes Father    Anesthesia problems Neg Hx    Hypotension Neg Hx    Malignant hyperthermia Neg Hx     Pseudochol deficiency Neg Hx     Social History   Tobacco Use   Smoking status: Never   Smokeless tobacco: Never  Vaping Use   Vaping Use: Never used  Substance Use Topics   Alcohol use: No   Drug use: No    Home Medications Prior to Admission medications   Medication Sig Start Date End Date Taking? Authorizing Provider  albuterol (VENTOLIN HFA) 108 (90 Base) MCG/ACT inhaler Inhale 2 puffs into the lungs every 6 (six) hours as needed for wheezing or shortness of breath.    [provider]  cetirizine (ZYRTEC) 10 MG tablet Take 10 mg by mouth as needed for allergies.    [provider]  furosemide (LASIX) 20 MG tablet Take 40 mg by mouth daily.  10/27/19   [provider]  hydrochlorothiazide (HYDRODIURIL) 25 MG tablet Take 1 tablet (25 mg total) by mouth daily. 03/11/20   Elige Radonhristian, Rylee, MD  levothyroxine (SYNTHROID) 50 MCG tablet Take 50 mcg by mouth daily. Patient not taking: Reported on 06/17/2021 12/30/19   [provider]  losartan (COZAAR) 100 MG tablet Take 100 mg by mouth daily. 01/26/20   [provider]  metFORMIN (GLUCOPHAGE) 1000 MG tablet Take 1,000 mg by mouth daily.  12/30/19   [provider]  NURTEC 75 MG TBDP Take by mouth. 06/08/20   [provider]  propranolol ER (INDERAL LA) 60 MG 24 hr capsule Take 1 capsule (60 mg total) by mouth at bedtime. 06/18/21   Dillion Stowers, Mayer Maskerourtney F, MD  Rivaroxaban 15 & 20 MG TBPK Follow package directions: Take one 15mg  tablet by mouth twice a day. On day 22, switch to one 20mg  tablet once a day. Take with food. 03/10/20   Elige Radonhristian, Rylee, MD  spironolactone (ALDACTONE) 25 MG tablet Take 1 tablet (25 mg total) by mouth daily. 03/11/20   Christian, Rylee, MD  tiZANidine (ZANAFLEX) 4 MG tablet TAKE 1 TABLET (4 MG TOTAL) BY MOUTH EVERY 6 (SIX) HOURS AS NEEDED FOR MUSCLE SPASMS. 08/06/19   Anson FretAhern, Antonia B, MD  traMADol (ULTRAM) 50 MG tablet TAKE 1 TO 2 TABLETS EVERY 6 HOURS FOR  PAIN Patient taking differently: Take 50-100 mg by mouth every 6 (six) hours.  02/09/20   Lomax, Amy, NP  Ubrogepant (UBRELVY) 100 MG TABS Take 100 mg by mouth daily as needed. Take one tablet at onset of headache, may repeat 1 tablet in 2 hours, no more than 2 tablets in 24 hours 06/14/20   Lomax, Amy, NP  Vitamin D, Ergocalciferol, (DRISDOL) 50000 units CAPS capsule TAKE 1 CAPSULE BY MOUTH ONCE A WEEK. FOR 8 WEEKS Patient taking differently: Take 50,000  Units by mouth every 7 (seven) days. Sundays 07/17/18   Diallo, Lilia Argue, MD  zolpidem (AMBIEN) 5 MG tablet Take 1 tablet (5 mg total) by mouth at bedtime as needed for sleep. 03/04/20   Jacalyn Lefevre, MD    Allergies    Relpax [eletriptan], Oxycodone, and Phenergan [promethazine hcl]  Review of Systems   Review of Systems  Constitutional:  Negative for fever.  Eyes:  Positive for photophobia.  Respiratory:  Negative for chest tightness.   Cardiovascular:  Positive for palpitations. Negative for chest pain.  Gastrointestinal:  Negative for abdominal pain, nausea and vomiting.  Neurological:  Positive for headaches.  All other systems reviewed and are negative.  Physical Exam Updated Vital Signs BP 139/75   Pulse 89   Temp 98.9 F (37.2 C) (Oral)   Resp 20   Ht 1.676 m (5\' 6" )   Wt 91.1 kg   LMP 05/15/2021 Comment: Mirena- IUD  SpO2 100%   BMI 32.42 kg/m   Physical Exam Vitals and nursing note reviewed.  Constitutional:      Appearance: She is well-developed.  HENT:     Head: Normocephalic and atraumatic.     Mouth/Throat:     Mouth: Mucous membranes are moist.  Eyes:     Extraocular Movements: Extraocular movements intact.     Pupils: Pupils are equal, round, and reactive to light.  Cardiovascular:     Rate and Rhythm: Normal rate and regular rhythm.     Heart sounds: Normal heart sounds.  Pulmonary:     Effort: Pulmonary effort is normal. No respiratory distress.     Breath sounds: No wheezing.  Abdominal:      Palpations: Abdomen is soft.  Musculoskeletal:     Cervical back: Normal range of motion and neck supple.  Skin:    General: Skin is warm and dry.  Neurological:     Mental Status: She is alert and oriented to person, place, and time.     Comments: Fluent speech, cranial nerves II through XII intact, 5 out of 5 strength in all 4 extremities  Psychiatric:        Mood and Affect: Mood normal.    ED Results / Procedures / Treatments   Labs (all labs ordered are listed, but only abnormal results are displayed) Labs Reviewed - No data to display  EKG None  Radiology No results found.  Procedures Procedures   Medications Ordered in ED Medications  propranolol ER (INDERAL LA) 24 hr capsule 60 mg (has no administration in time range)    ED Course  I have reviewed the triage vital signs and the nursing notes.  Pertinent labs & imaging results that were available during my care of the patient were reviewed by me and considered in my medical decision making (see chart for details).  Clinical Course as of 06/18/21 0101  Mon Jun 18, 2021  0038 Patient declined migraine treatment.  She is just requesting prescription for her home propranolol. [CH]    Clinical Course User Index [CH] Vonnie Ligman, 0039, MD   MDM Rules/Calculators/A&P                          Patient presents with pulsating headache and palpitations.  Has not taken her propranolol since Thursday.  She is nontoxic and vital signs are reassuring.  Her symptoms are characteristic for her migraines per the patient.  She is neurologically intact.  No red flags.  Doubt subarachnoid  hemorrhage or infectious etiology such as meningitis.  We do not have propranolol here.  I offered a migraine cocktail.  Patient declines having an IV placed or further treatment.  She is simply requesting her prescription.  Additionally, did obtain an EKG.  There is no evidence of acute arrhythmia or ischemia.  She reports palpitations but this  could be related to not having propranolol on board.  I do not see PACs or PVCs.  She is not having any shortness of breath or chest pain.  Patient requesting discharge.  I sent her propranolol prescription to the pharmacy.  She was given strict return precautions.  After history, exam, and medical workup I feel the patient has been appropriately medically screened and is safe for discharge home. Pertinent diagnoses were discussed with the patient. Patient was given return precautions.  Final Clinical Impression(s) / ED Diagnoses Final diagnoses:  Other migraine without status migrainosus, not intractable    Rx / DC Orders ED Discharge Orders          Ordered    propranolol ER (INDERAL LA) 60 MG 24 hr capsule  Daily at bedtime       Note to Pharmacy: Must keep FU in Jan   06/18/21 0047             Shon Baton, MD 06/18/21 0104

## 2023-02-28 ENCOUNTER — Emergency Department (HOSPITAL_BASED_OUTPATIENT_CLINIC_OR_DEPARTMENT_OTHER)
Admission: EM | Admit: 2023-02-28 | Discharge: 2023-02-28 | Disposition: A | Discharge status: Home / Self Care | Payer: BLUE CROSS/BLUE SHIELD | Attending: Emergency Medicine | Admitting: Emergency Medicine

## 2023-02-28 ENCOUNTER — Other Ambulatory Visit: Payer: Self-pay

## 2023-02-28 ENCOUNTER — Encounter (HOSPITAL_BASED_OUTPATIENT_CLINIC_OR_DEPARTMENT_OTHER): Payer: Self-pay

## 2023-02-28 ENCOUNTER — Emergency Department (HOSPITAL_BASED_OUTPATIENT_CLINIC_OR_DEPARTMENT_OTHER): Payer: BLUE CROSS/BLUE SHIELD | Admitting: Radiology

## 2023-02-28 DIAGNOSIS — M25511 Pain in right shoulder: Secondary | ICD-10-CM | POA: Diagnosis present

## 2023-02-28 DIAGNOSIS — M25521 Pain in right elbow: Secondary | ICD-10-CM

## 2023-02-28 MED ORDER — ACETAMINOPHEN 325 MG PO TABS
650.0000 mg | ORAL_TABLET | Freq: Once | ORAL | Status: AC
  Administered 2023-02-28: 650 mg via ORAL
  Filled 2023-02-28: qty 2

## 2023-02-28 NOTE — ED Notes (Signed)
Pt provided AVS; Educated on sling management, OTC pain management and RICE. Pt verbalized understanding- no further questions upon discharge.

## 2023-02-28 NOTE — ED Provider Notes (Signed)
[facility] Provider Note   CSN: [id number] Arrival date & time: 02/28/23  2037     History  Chief Complaint  Patient presents with   Shoulder Injury    Susan Hopkins is a 52 y.o. female, history of nephrotic syndrome, who presents to the ED secondary to right shoulder pain has been going on for the last 9 hours.  She states that around noon her shoulder gave out, and she fell into the mattress of her bed.  She states that since then it is hard to move her arm back-and-forth, and she is having some elbow pain.  Has not taken anything for this as she is fasting.  Denies any numbness tingling of her hands.     Home Medications Prior to Admission medications   Medication Sig Start Date End Date Taking? Authorizing Provider  albuterol (VENTOLIN HFA) 108 (90 Base) MCG/ACT inhaler Inhale 2 puffs into the lungs every 6 (six) hours as needed for wheezing or shortness of breath.    [provider]  cetirizine (ZYRTEC) 10 MG tablet Take 10 mg by mouth as needed for allergies.    [provider]  furosemide (LASIX) 20 MG tablet Take 40 mg by mouth daily.  10/27/19   [provider]  hydrochlorothiazide (HYDRODIURIL) 25 MG tablet Take 1 tablet (25 mg total) by mouth daily. 03/11/20   Mitzi Hansen, MD  levothyroxine (SYNTHROID) 50 MCG tablet Take 50 mcg by mouth daily. Patient not taking: Reported on 06/17/2021 12/30/19   [provider]  losartan (COZAAR) 100 MG tablet Take 100 mg by mouth daily. 01/26/20   [provider]  metFORMIN (GLUCOPHAGE) 1000 MG tablet Take 1,000 mg by mouth daily.  12/30/19   [provider]  NURTEC 75 MG TBDP Take by mouth. 06/08/20   [provider]  propranolol ER (INDERAL LA) 60 MG 24 hr capsule Take 1 capsule (60 mg total) by mouth at bedtime. 06/18/21   Horton, Barbette Hair, MD  Rivaroxaban 15 & 20 MG TBPK Follow package directions: Take one 15mg  tablet by mouth  twice a day. On day 22, switch to one 20mg  tablet once a day. Take with food. 03/10/20   Mitzi Hansen, MD  spironolactone (ALDACTONE) 25 MG tablet Take 1 tablet (25 mg total) by mouth daily. 03/11/20   Christian, Rylee, MD  tiZANidine (ZANAFLEX) 4 MG tablet TAKE 1 TABLET (4 MG TOTAL) BY MOUTH EVERY 6 (SIX) HOURS AS NEEDED FOR MUSCLE SPASMS. 08/06/19   Melvenia Beam, MD  traMADol (ULTRAM) 50 MG tablet TAKE 1 TO 2 TABLETS EVERY 6 HOURS FOR PAIN Patient taking differently: Take 50-100 mg by mouth every 6 (six) hours.  02/09/20   Lomax, Amy, NP  Ubrogepant (UBRELVY) 100 MG TABS Take 100 mg by mouth daily as needed. Take one tablet at onset of headache, may repeat 1 tablet in 2 hours, no more than 2 tablets in 24 hours 06/14/20   Lomax, Amy, NP  Vitamin D, Ergocalciferol, (DRISDOL) 50000 units CAPS capsule TAKE 1 CAPSULE BY MOUTH ONCE A WEEK. FOR 8 WEEKS Patient taking differently: Take 50,000 Units by mouth every 7 (seven) days. Sundays 07/17/18   Diallo, Earna Coder, MD  zolpidem (AMBIEN) 5 MG tablet Take 1 tablet (5 mg total) by mouth at bedtime as needed for sleep. 03/04/20   Isla Pence, MD      Allergies    Relpax [eletriptan], Oxycodone, and Phenergan [promethazine hcl]    Review of Systems  Review of Systems  Musculoskeletal:        +R shoulder pain  Neurological:  Negative for numbness.    Physical Exam Updated Vital Signs BP 138/75   Pulse 77   Temp 98 F (36.7 C) (Oral)   Resp 18   Wt 78 kg   LMP 12/30/2022   SpO2 100%   BMI 27.76 kg/m  Physical Exam Vitals and nursing note reviewed.  Constitutional:      General: She is not in acute distress.    Appearance: She is well-developed.  HENT:     Head: Normocephalic and atraumatic.  Eyes:     General:        Right eye: No discharge.        Left eye: No discharge.     Conjunctiva/sclera: Conjunctivae normal.  Cardiovascular:     Pulses: Normal pulses.  Pulmonary:     Effort: No respiratory distress.   Musculoskeletal:     Comments: Right shoulder: Tenderness to palpation humeral head, as well as rotator cuff muscles along the right shoulder.  Pain with internal and external rotation of shoulder, abduction adduction.  Flexion and extension intact for elbow as well as supination and pronation.  Good grip and grip strength.  Positive radial pulse.  No deformities noted.  Tenderness to palpation of posterior shoulder.5/5 strength of bilateral upper extremities.  Neurological:     Mental Status: She is alert.     Comments: Clear speech.   Psychiatric:        Behavior: Behavior normal.        Thought Content: Thought content normal.     ED Results / Procedures / Treatments   Labs (all labs ordered are listed, but only abnormal results are displayed) Labs Reviewed - No data to display  EKG None  Radiology DG Elbow Complete Right  Result Date: 02/28/2023 CLINICAL DATA:  Elbow pain EXAM: RIGHT ELBOW - COMPLETE 3+ VIEW COMPARISON:  None Available. FINDINGS: There is no evidence of fracture, dislocation, or joint effusion. There is no evidence of arthropathy or other focal bone abnormality. Soft tissues are unremarkable. IMPRESSION: Negative. Electronically Signed   By: Donavan Foil M.D.   On: 02/28/2023 21:17   DG Shoulder Right  Result Date: 02/28/2023 CLINICAL DATA:  Shoulder pain injury EXAM: RIGHT SHOULDER - 2+ VIEW COMPARISON:  None Available. FINDINGS: No fracture or malalignment. Calcific tendinopathy. Right apex is clear. IMPRESSION: No acute osseous abnormality. Calcific tendinopathy. Electronically Signed   By: Donavan Foil M.D.   On: 02/28/2023 21:16    Procedures Procedures    Medications Ordered in ED Medications  acetaminophen (TYLENOL) tablet 650 mg (has no administration in time range)    ED Course/ Medical Decision Making/ A&P                             Medical Decision Making Patient is a 52 year old female, here for right shoulder pain that happened after  she fell into the bed.  States it is worse with motion has not done anything for the pain.  We will obtain x-rays for further evaluation.  Amount and/or Complexity of Data Reviewed Radiology: ordered.    Details: Unremarkable except for some calcific tendinopathy Discussion of management or test interpretation with external provider(s): Discussed with patient she has not been having any chronic pain, so the calcific tendinopathy is likely not the culprit of this pain.  She possibly may have a partial  rotator cuff tear versus just a bad sprain.  We will put her in a sling for comfort, and I advised her to take Tylenol for the pain control.  Follow-up with her primary care closely.    Final Clinical Impression(s) / ED Diagnoses Final diagnoses:  Acute pain of right shoulder  Elbow pain, right    Rx / DC Orders ED Discharge Orders     None         Sharyon Peitz, Si Gaul, PA 02/28/23 2136    Lennice Sites, DO 02/28/23 2142

## 2023-02-28 NOTE — ED Triage Notes (Signed)
POV from home, A&O x 4, amb to room, NAD, GCS 15  Pt sts that approx noon today that she was getting into bed which is high up and right elbow gave out, she fell onto right shoulder on the bed. Sts pain since this happened, worse with movement.

## 2023-02-28 NOTE — Discharge Instructions (Signed)
Please follow-up with your primary care doctor, use the sling as needed for support.  You can take Tylenol for your pain.  If you have any loss of pulse, worsening pain, please return to the ER.  Your x-rays were unremarkable.  Make sure you ice the area to help with swelling and pain.

## 2023-12-10 ENCOUNTER — Emergency Department (HOSPITAL_BASED_OUTPATIENT_CLINIC_OR_DEPARTMENT_OTHER)
Admission: EM | Admit: 2023-12-10 | Discharge: 2023-12-10 | Disposition: A | Discharge status: Home / Self Care | Payer: BLUE CROSS/BLUE SHIELD | Attending: Emergency Medicine | Admitting: Emergency Medicine

## 2023-12-10 ENCOUNTER — Other Ambulatory Visit: Payer: Self-pay

## 2023-12-10 ENCOUNTER — Emergency Department (HOSPITAL_BASED_OUTPATIENT_CLINIC_OR_DEPARTMENT_OTHER): Payer: BLUE CROSS/BLUE SHIELD

## 2023-12-10 ENCOUNTER — Encounter (HOSPITAL_BASED_OUTPATIENT_CLINIC_OR_DEPARTMENT_OTHER): Payer: Self-pay

## 2023-12-10 DIAGNOSIS — R0789 Other chest pain: Secondary | ICD-10-CM | POA: Diagnosis not present

## 2023-12-10 DIAGNOSIS — R519 Headache, unspecified: Secondary | ICD-10-CM | POA: Diagnosis present

## 2023-12-10 DIAGNOSIS — G43809 Other migraine, not intractable, without status migrainosus: Secondary | ICD-10-CM | POA: Diagnosis not present

## 2023-12-10 LAB — CBC
HCT: 41 % (ref 36.0–46.0)
Hemoglobin: 12.8 g/dL (ref 12.0–15.0)
MCH: 25 pg — ABNORMAL LOW (ref 26.0–34.0)
MCHC: 31.2 g/dL (ref 30.0–36.0)
MCV: 80.2 fL (ref 80.0–100.0)
Platelets: 306 10*3/uL (ref 150–400)
RBC: 5.11 MIL/uL (ref 3.87–5.11)
RDW: 14.8 % (ref 11.5–15.5)
WBC: 5.6 10*3/uL (ref 4.0–10.5)
nRBC: 0 % (ref 0.0–0.2)

## 2023-12-10 LAB — BASIC METABOLIC PANEL
Anion gap: 8 (ref 5–15)
BUN: 12 mg/dL (ref 6–20)
CO2: 27 mmol/L (ref 22–32)
Calcium: 9.4 mg/dL (ref 8.9–10.3)
Chloride: 104 mmol/L (ref 98–111)
Creatinine, Ser: 0.58 mg/dL (ref 0.44–1.00)
GFR, Estimated: >60 mL/min (ref >60)
Glucose, Bld: 151 mg/dL — ABNORMAL HIGH (ref 70–99)
Potassium: 3.6 mmol/L (ref 3.5–5.1)
Sodium: 139 mmol/L (ref 135–145)

## 2023-12-10 LAB — PREGNANCY, URINE: Preg Test, Ur: NEGATIVE

## 2023-12-10 LAB — TROPONIN I (HIGH SENSITIVITY): Troponin I (High Sensitivity): <2 ng/L (ref <18)

## 2023-12-10 MED ORDER — KETOROLAC TROMETHAMINE 15 MG/ML IJ SOLN
15.0000 mg | Freq: Once | INTRAMUSCULAR | Status: AC
  Administered 2023-12-10: 15 mg via INTRAVENOUS
  Filled 2023-12-10: qty 1

## 2023-12-10 MED ORDER — ONDANSETRON HCL 4 MG/2ML IJ SOLN
4.0000 mg | Freq: Once | INTRAMUSCULAR | Status: AC
  Administered 2023-12-10: 4 mg via INTRAVENOUS
  Filled 2023-12-10: qty 2

## 2023-12-10 MED ORDER — DIPHENHYDRAMINE HCL 50 MG/ML IJ SOLN
25.0000 mg | Freq: Once | INTRAMUSCULAR | Status: AC
  Administered 2023-12-10: 25 mg via INTRAVENOUS
  Filled 2023-12-10: qty 1

## 2023-12-10 NOTE — ED Provider Notes (Signed)
[place] EMERGENCY DEPARTMENT AT [facility] Provider Note   CSN: [id number] Arrival date & time: 12/10/23  1850     History  Chief Complaint  Patient presents with   Chest Pain   Headache    Susan Hopkins is a 52 y.o. female past medical history significant for migraine presents today for headache x 2 days.  Patient endorses nausea, dizziness, vomiting, chest tightness coming up her left side of neck and down her left arm, photophobia, and phonophobia.  She describes as chest tightness as intermittent for the past few days.  Patient denies fever, chills, abdominal pain, diarrhea, sore throat, or cough.  Patient states that this feels similar to her previous migraines.   Chest Pain Associated symptoms: dizziness, headache, nausea and vomiting   Headache Associated symptoms: dizziness, nausea, photophobia and vomiting        Home Medications Prior to Admission medications   Medication Sig Start Date End Date Taking? Authorizing Provider  albuterol (VENTOLIN HFA) 108 (90 Base) MCG/ACT inhaler Inhale 2 puffs into the lungs every 6 (six) hours as needed for wheezing or shortness of breath.    [provider]  cetirizine (ZYRTEC) 10 MG tablet Take 10 mg by mouth as needed for allergies.    [provider]  furosemide (LASIX) 20 MG tablet Take 40 mg by mouth daily.  10/27/19   [provider]  hydrochlorothiazide (HYDRODIURIL) 25 MG tablet Take 1 tablet (25 mg total) by mouth daily. 03/11/20   Elige Radon, MD  levothyroxine (SYNTHROID) 50 MCG tablet Take 50 mcg by mouth daily. Patient not taking: Reported on 06/17/2021 12/30/19   [provider]  losartan (COZAAR) 100 MG tablet Take 100 mg by mouth daily. 01/26/20   [provider]  metFORMIN (GLUCOPHAGE) 1000 MG tablet Take 1,000 mg by mouth daily.  12/30/19   [provider]  NURTEC 75 MG TBDP Take by mouth. 06/08/20   [provider]  propranolol ER (INDERAL  LA) 60 MG 24 hr capsule Take 1 capsule (60 mg total) by mouth at bedtime. 06/18/21   Horton, Mayer Masker, MD  Rivaroxaban 15 & 20 MG TBPK Follow package directions: Take one 15mg  tablet by mouth twice a day. On day 22, switch to one 20mg  tablet once a day. Take with food. 03/10/20   Elige Radon, MD  spironolactone (ALDACTONE) 25 MG tablet Take 1 tablet (25 mg total) by mouth daily. 03/11/20   Christian, Rylee, MD  tiZANidine (ZANAFLEX) 4 MG tablet TAKE 1 TABLET (4 MG TOTAL) BY MOUTH EVERY 6 (SIX) HOURS AS NEEDED FOR MUSCLE SPASMS. 08/06/19   Anson Fret, MD  traMADol (ULTRAM) 50 MG tablet TAKE 1 TO 2 TABLETS EVERY 6 HOURS FOR PAIN Patient taking differently: Take 50-100 mg by mouth every 6 (six) hours.  02/09/20   Lomax, Amy, NP  Ubrogepant (UBRELVY) 100 MG TABS Take 100 mg by mouth daily as needed. Take one tablet at onset of headache, may repeat 1 tablet in 2 hours, no more than 2 tablets in 24 hours 06/14/20   Lomax, Amy, NP  Vitamin D, Ergocalciferol, (DRISDOL) 50000 units CAPS capsule TAKE 1 CAPSULE BY MOUTH ONCE A WEEK. FOR 8 WEEKS Patient taking differently: Take 50,000 Units by mouth every 7 (seven) days. Sundays 07/17/18   Diallo, Lilia Argue, MD  zolpidem (AMBIEN) 5 MG tablet Take 1 tablet (5 mg total) by mouth at bedtime as needed for sleep. 03/04/20   Jacalyn Lefevre, MD  Allergies    Relpax [eletriptan], Oxycodone, and Phenergan [promethazine hcl]    Review of Systems   Review of Systems  Eyes:  Positive for photophobia.  Cardiovascular:  Positive for chest pain.  Gastrointestinal:  Positive for nausea and vomiting.  Neurological:  Positive for dizziness and headaches.    Physical Exam Updated Vital Signs BP 117/70   Pulse 71   Temp 98.7 F (37.1 C) (Oral)   Resp 17   Ht 5\' 6"  (1.676 m)   Wt 78.9 kg   SpO2 97%   BMI 28.08 kg/m  Physical Exam Vitals and nursing note reviewed.  Constitutional:      General: She is not in acute distress.    Appearance: She is  well-developed.  HENT:     Head: Normocephalic and atraumatic.  Eyes:     Extraocular Movements: Extraocular movements intact.     Conjunctiva/sclera: Conjunctivae normal.     Pupils: Pupils are equal, round, and reactive to light.  Cardiovascular:     Rate and Rhythm: Normal rate and regular rhythm.     Heart sounds: Normal heart sounds. No murmur heard. Pulmonary:     Effort: Pulmonary effort is normal. No respiratory distress.     Breath sounds: Normal breath sounds. No decreased breath sounds or wheezing.  Chest:     Chest wall: Tenderness present.     Comments: Chest tenderness to palpation up the left platysma muscle and down the left trapezius.  Patient is neurovascularly intact Abdominal:     Palpations: Abdomen is soft.     Tenderness: There is no abdominal tenderness.  Musculoskeletal:        General: No swelling.     Cervical back: Neck supple.     Right lower leg: No edema.     Left lower leg: No edema.  Skin:    General: Skin is warm and dry.     Capillary Refill: Capillary refill takes less than 2 seconds.  Neurological:     General: No focal deficit present.     Mental Status: She is alert.     Motor: No weakness.  Psychiatric:        Mood and Affect: Mood normal.     ED Results / Procedures / Treatments   Labs (all labs ordered are listed, but only abnormal results are displayed) Labs Reviewed  BASIC METABOLIC PANEL - Abnormal; Notable for the following components:      Result Value   Glucose, Bld 151 (*)    All other components within normal limits  CBC - Abnormal; Notable for the following components:   MCH 25.0 (*)    All other components within normal limits  PREGNANCY, URINE  TROPONIN I (HIGH SENSITIVITY)  TROPONIN I (HIGH SENSITIVITY)    EKG None  Radiology DG Chest Port 1 View Result Date: 12/10/2023 CLINICAL DATA:  Headache and chest pain. EXAM: PORTABLE CHEST 1 VIEW COMPARISON:  March 07, 2020 FINDINGS: The heart size and mediastinal  contours are within normal limits. There is no evidence of an acute infiltrate, pleural effusion or pneumothorax. The visualized skeletal structures are unremarkable. IMPRESSION: No active disease. Electronically Signed   By: Aram Candela M.D.   On: 12/10/2023 19:53    Procedures Procedures    Medications Ordered in ED Medications  ketorolac (TORADOL) 15 MG/ML injection 15 mg (15 mg Intravenous Given 12/10/23 2004)  ondansetron (ZOFRAN) injection 4 mg (4 mg Intravenous Given 12/10/23 2003)  diphenhydrAMINE (BENADRYL) injection 25 mg (25  mg Intravenous Given 12/10/23 2007)    ED Course/ Medical Decision Making/ A&P                                 Medical Decision Making Amount and/or Complexity of Data Reviewed Labs: ordered. Radiology: ordered.   This patient presents to the ED with chief complaint(s) of chest pain and headache with pertinent past medical history of migraine, PE, hypertension, diabetes which further complicates the presenting complaint. The complaint involves an extensive differential diagnosis and also carries with it a high risk of complications and morbidity.    The differential diagnosis includes headache, STEMI, NSTEMI, PE  Additional history obtained: Additional history obtained from spouse Records reviewed Care Everywhere/External Records  ED Course and Reassessment: Patient given migraine cocktail  Independent labs interpretation:  The following labs were independently interpreted:  CBC: No notable findings BMP: No notable findings EKG: Sinus rhythm, LAE, consider biatrial enlargement, Borderline left axis deviation Troponin: <2  Independent visualization of imaging: - I independently visualized the following imaging with scope of interpretation limited to determining acute life threatening conditions related to emergency care: Chest x-ray, which revealed no active disease.  Consultation: - Consulted or discussed management/test  interpretation w/ external professional: None  Consideration for admission or further workup: Consider for admission or further workup however patient's vital sign, physical exam, labs, and imaging have all been reassuring.  Patient's symptoms likely due to migraine.  Patient states she is feeling much better after migraine cocktail.  Patient should follow-up with PCP symptoms persist for further evaluation and workup.         Final Clinical Impression(s) / ED Diagnoses Final diagnoses:  Other migraine without status migrainosus, not intractable    Rx / DC Orders ED Discharge Orders     None         Gretta Began 12/10/23 2119    Anders Simmonds T, DO 12/11/23 1642

## 2023-12-10 NOTE — ED Triage Notes (Addendum)
Patient arrives ambulatory to ED with complaints of migraine headache and chest pain. Patient reports feeling lightheaded as well with moments of weakness.   --accompanied by her spouse.

## 2023-12-10 NOTE — Discharge Instructions (Addendum)
Today you were seen for a migraine.  Please follow-up with your PCP if your symptoms persist for further evaluation and workup.  Thank you for letting us treat you today. After reviewing your labs and imaging, I feel you are safe to go home. Please follow up with your PCP in the next several days and provide them with your records from this visit. Return to the Emergency Room if pain becomes severe or symptoms worsen.

## 2024-02-05 DIAGNOSIS — Z86711 Personal history of pulmonary embolism: Secondary | ICD-10-CM | POA: Diagnosis not present

## 2024-02-05 DIAGNOSIS — D509 Iron deficiency anemia, unspecified: Secondary | ICD-10-CM | POA: Diagnosis not present

## 2024-05-02 ENCOUNTER — Encounter (HOSPITAL_COMMUNITY): Payer: Self-pay

## 2024-05-02 ENCOUNTER — Other Ambulatory Visit: Payer: Self-pay

## 2024-05-02 ENCOUNTER — Emergency Department (HOSPITAL_COMMUNITY)
Admission: EM | Admit: 2024-05-02 | Discharge: 2024-05-02 | Discharge status: Against Medical Advice | Attending: Emergency Medicine | Admitting: Emergency Medicine

## 2024-05-02 DIAGNOSIS — R1013 Epigastric pain: Secondary | ICD-10-CM | POA: Insufficient documentation

## 2024-05-02 DIAGNOSIS — Z5321 Procedure and treatment not carried out due to patient leaving prior to being seen by health care provider: Secondary | ICD-10-CM | POA: Insufficient documentation

## 2024-05-02 DIAGNOSIS — R112 Nausea with vomiting, unspecified: Secondary | ICD-10-CM | POA: Diagnosis not present

## 2024-05-02 LAB — LIPASE, BLOOD: Lipase: 32 U/L (ref 11–51)

## 2024-05-02 LAB — COMPREHENSIVE METABOLIC PANEL WITH GFR
ALT: 15 U/L (ref 0–44)
AST: 17 U/L (ref 15–41)
Albumin: 3.7 g/dL (ref 3.5–5.0)
Alkaline Phosphatase: 66 U/L (ref 38–126)
Anion gap: 9 (ref 5–15)
BUN: 10 mg/dL (ref 6–20)
CO2: 22 mmol/L (ref 22–32)
Calcium: 8.8 mg/dL — ABNORMAL LOW (ref 8.9–10.3)
Chloride: 106 mmol/L (ref 98–111)
Creatinine, Ser: 0.52 mg/dL (ref 0.44–1.00)
GFR, Estimated: >60 mL/min (ref >60)
Glucose, Bld: 145 mg/dL — ABNORMAL HIGH (ref 70–99)
Potassium: 3.7 mmol/L (ref 3.5–5.1)
Sodium: 137 mmol/L (ref 135–145)
Total Bilirubin: 0.6 mg/dL (ref 0.0–1.2)
Total Protein: 7 g/dL (ref 6.5–8.1)

## 2024-05-02 LAB — CBC
HCT: 39.4 % (ref 36.0–46.0)
Hemoglobin: 11.7 g/dL — ABNORMAL LOW (ref 12.0–15.0)
MCH: 24.7 pg — ABNORMAL LOW (ref 26.0–34.0)
MCHC: 29.7 g/dL — ABNORMAL LOW (ref 30.0–36.0)
MCV: 83.3 fL (ref 80.0–100.0)
Platelets: 278 10*3/uL (ref 150–400)
RBC: 4.73 MIL/uL (ref 3.87–5.11)
RDW: 15.2 % (ref 11.5–15.5)
WBC: 6.5 10*3/uL (ref 4.0–10.5)
nRBC: 0 % (ref 0.0–0.2)

## 2024-05-02 LAB — HCG, SERUM, QUALITATIVE: Preg, Serum: NEGATIVE

## 2024-05-02 NOTE — ED Triage Notes (Signed)
 [facility] reports pt c/o abdominal/epigastric pain with n/v.

## 2024-05-02 NOTE — ED Notes (Signed)
 PT desiring to leave- states has kids at home and has to go
